# Patient Record
Sex: Female | Born: 1975 | Race: White | Hispanic: No | Marital: Single | Smoking: Never smoker
Health system: Southern US, Community
[De-identification: ages and names within clinical notes are randomized; demographics above are authoritative.]

## PROBLEM LIST (undated history)

## (undated) DIAGNOSIS — I10 Essential (primary) hypertension: Secondary | ICD-10-CM

## (undated) DIAGNOSIS — G039 Meningitis, unspecified: Secondary | ICD-10-CM

## (undated) DIAGNOSIS — R569 Unspecified convulsions: Secondary | ICD-10-CM

## (undated) DIAGNOSIS — I639 Cerebral infarction, unspecified: Secondary | ICD-10-CM

## (undated) DIAGNOSIS — G5622 Lesion of ulnar nerve, left upper limb: Secondary | ICD-10-CM

## (undated) DIAGNOSIS — G43909 Migraine, unspecified, not intractable, without status migrainosus: Secondary | ICD-10-CM

## (undated) HISTORY — PX: CHOLECYSTECTOMY: SHX55

## (undated) HISTORY — DX: Cerebral infarction, unspecified: I63.9

## (undated) HISTORY — PX: NO PAST SURGERIES: SHX2092

## (undated) HISTORY — DX: Unspecified convulsions: R56.9

## (undated) HISTORY — DX: Essential (primary) hypertension: I10

## (undated) HISTORY — PX: LYMPH NODE BIOPSY: SHX201

---

## 2015-04-28 DIAGNOSIS — G5622 Lesion of ulnar nerve, left upper limb: Secondary | ICD-10-CM | POA: Insufficient documentation

## 2016-12-03 ENCOUNTER — Inpatient Hospital Stay (HOSPITAL_COMMUNITY): Payer: Medicaid Other

## 2016-12-03 ENCOUNTER — Inpatient Hospital Stay (HOSPITAL_COMMUNITY)
Admission: EM | Admit: 2016-12-03 | Discharge: 2016-12-10 | DRG: 064 | Disposition: A | Discharge status: Rehabilitation Facility | Payer: Medicaid Other | Attending: Internal Medicine | Admitting: Internal Medicine

## 2016-12-03 ENCOUNTER — Observation Stay (HOSPITAL_COMMUNITY): Payer: Medicaid Other

## 2016-12-03 ENCOUNTER — Emergency Department (HOSPITAL_COMMUNITY): Payer: Medicaid Other

## 2016-12-03 ENCOUNTER — Encounter (HOSPITAL_COMMUNITY): Payer: Self-pay | Admitting: Emergency Medicine

## 2016-12-03 DIAGNOSIS — E669 Obesity, unspecified: Secondary | ICD-10-CM | POA: Diagnosis present

## 2016-12-03 DIAGNOSIS — E876 Hypokalemia: Secondary | ICD-10-CM

## 2016-12-03 DIAGNOSIS — G936 Cerebral edema: Secondary | ICD-10-CM | POA: Diagnosis present

## 2016-12-03 DIAGNOSIS — R4781 Slurred speech: Secondary | ICD-10-CM | POA: Diagnosis present

## 2016-12-03 DIAGNOSIS — E041 Nontoxic single thyroid nodule: Secondary | ICD-10-CM | POA: Diagnosis present

## 2016-12-03 DIAGNOSIS — I69391 Dysphagia following cerebral infarction: Secondary | ICD-10-CM | POA: Diagnosis not present

## 2016-12-03 DIAGNOSIS — R29708 NIHSS score 8: Secondary | ICD-10-CM | POA: Diagnosis present

## 2016-12-03 DIAGNOSIS — I639 Cerebral infarction, unspecified: Secondary | ICD-10-CM

## 2016-12-03 DIAGNOSIS — I10 Essential (primary) hypertension: Secondary | ICD-10-CM | POA: Diagnosis present

## 2016-12-03 DIAGNOSIS — R471 Dysarthria and anarthria: Secondary | ICD-10-CM | POA: Diagnosis present

## 2016-12-03 DIAGNOSIS — I69354 Hemiplegia and hemiparesis following cerebral infarction affecting left non-dominant side: Secondary | ICD-10-CM | POA: Diagnosis not present

## 2016-12-03 DIAGNOSIS — R51 Headache: Secondary | ICD-10-CM

## 2016-12-03 DIAGNOSIS — R7303 Prediabetes: Secondary | ICD-10-CM

## 2016-12-03 DIAGNOSIS — E785 Hyperlipidemia, unspecified: Secondary | ICD-10-CM | POA: Diagnosis present

## 2016-12-03 DIAGNOSIS — R29810 Facial weakness: Secondary | ICD-10-CM | POA: Diagnosis present

## 2016-12-03 DIAGNOSIS — R4182 Altered mental status, unspecified: Secondary | ICD-10-CM | POA: Diagnosis present

## 2016-12-03 DIAGNOSIS — I6523 Occlusion and stenosis of bilateral carotid arteries: Secondary | ICD-10-CM

## 2016-12-03 DIAGNOSIS — Z6834 Body mass index (BMI) 34.0-34.9, adult: Secondary | ICD-10-CM

## 2016-12-03 DIAGNOSIS — G8194 Hemiplegia, unspecified affecting left nondominant side: Secondary | ICD-10-CM | POA: Diagnosis present

## 2016-12-03 DIAGNOSIS — R1310 Dysphagia, unspecified: Secondary | ICD-10-CM | POA: Diagnosis present

## 2016-12-03 DIAGNOSIS — D6861 Antiphospholipid syndrome: Secondary | ICD-10-CM

## 2016-12-03 DIAGNOSIS — I63411 Cerebral infarction due to embolism of right middle cerebral artery: Secondary | ICD-10-CM | POA: Diagnosis present

## 2016-12-03 DIAGNOSIS — G8114 Spastic hemiplegia affecting left nondominant side: Secondary | ICD-10-CM

## 2016-12-03 DIAGNOSIS — E782 Mixed hyperlipidemia: Secondary | ICD-10-CM

## 2016-12-03 DIAGNOSIS — R519 Headache, unspecified: Secondary | ICD-10-CM | POA: Diagnosis present

## 2016-12-03 DIAGNOSIS — I69319 Unspecified symptoms and signs involving cognitive functions following cerebral infarction: Secondary | ICD-10-CM | POA: Diagnosis not present

## 2016-12-03 DIAGNOSIS — R531 Weakness: Secondary | ICD-10-CM

## 2016-12-03 DIAGNOSIS — R414 Neurologic neglect syndrome: Secondary | ICD-10-CM | POA: Diagnosis not present

## 2016-12-03 DIAGNOSIS — R52 Pain, unspecified: Secondary | ICD-10-CM

## 2016-12-03 DIAGNOSIS — Z880 Allergy status to penicillin: Secondary | ICD-10-CM | POA: Diagnosis not present

## 2016-12-03 DIAGNOSIS — G43709 Chronic migraine without aura, not intractable, without status migrainosus: Secondary | ICD-10-CM

## 2016-12-03 HISTORY — DX: Lesion of ulnar nerve, left upper limb: G56.22

## 2016-12-03 HISTORY — DX: Meningitis, unspecified: G03.9

## 2016-12-03 HISTORY — DX: Migraine, unspecified, not intractable, without status migrainosus: G43.909

## 2016-12-03 LAB — COMPREHENSIVE METABOLIC PANEL
ALBUMIN: 2.8 g/dL — AB (ref 3.5–5.0)
ALK PHOS: 92 U/L (ref 38–126)
ALT: 54 U/L (ref 14–54)
AST: 34 U/L (ref 15–41)
Anion gap: 9 (ref 5–15)
BILIRUBIN TOTAL: 0.8 mg/dL (ref 0.3–1.2)
BUN: 12 mg/dL (ref 6–20)
CALCIUM: 8.5 mg/dL — AB (ref 8.9–10.3)
CO2: 24 mmol/L (ref 22–32)
CREATININE: 0.86 mg/dL (ref 0.44–1.00)
Chloride: 105 mmol/L (ref 101–111)
GFR calc Af Amer: >60 mL/min (ref >60)
GFR calc non Af Amer: >60 mL/min (ref >60)
GLUCOSE: 138 mg/dL — AB (ref 65–99)
Potassium: 3.5 mmol/L (ref 3.5–5.1)
Sodium: 138 mmol/L (ref 135–145)
TOTAL PROTEIN: 6.3 g/dL — AB (ref 6.5–8.1)

## 2016-12-03 LAB — I-STAT CHEM 8, ED
BUN: 12 mg/dL (ref 6–20)
CHLORIDE: 103 mmol/L (ref 101–111)
Calcium, Ion: 1.02 mmol/L — ABNORMAL LOW (ref 1.15–1.40)
Creatinine, Ser: 0.9 mg/dL (ref 0.44–1.00)
GLUCOSE: 135 mg/dL — AB (ref 65–99)
HCT: 39 % (ref 36.0–46.0)
Hemoglobin: 13.3 g/dL (ref 12.0–15.0)
Potassium: 3.4 mmol/L — ABNORMAL LOW (ref 3.5–5.1)
Sodium: 140 mmol/L (ref 135–145)
TCO2: 24 mmol/L (ref 0–100)

## 2016-12-03 LAB — VITAMIN B12: Vitamin B-12: 275 pg/mL (ref 180–914)

## 2016-12-03 LAB — DIFFERENTIAL
BASOS ABS: 0.1 10*3/uL (ref 0.0–0.1)
Basophils Relative: 1 %
EOS PCT: 2 %
Eosinophils Absolute: 0.1 10*3/uL (ref 0.0–0.7)
LYMPHS ABS: 3.2 10*3/uL (ref 0.7–4.0)
LYMPHS PCT: 42 %
Monocytes Absolute: 0.7 10*3/uL (ref 0.1–1.0)
Monocytes Relative: 10 %
NEUTROS PCT: 45 %
Neutro Abs: 3.4 10*3/uL (ref 1.7–7.7)

## 2016-12-03 LAB — CBC
HCT: 40.5 % (ref 36.0–46.0)
HEMOGLOBIN: 13.3 g/dL (ref 12.0–15.0)
MCH: 27.8 pg (ref 26.0–34.0)
MCHC: 32.8 g/dL (ref 30.0–36.0)
MCV: 84.7 fL (ref 78.0–100.0)
Platelets: 259 10*3/uL (ref 150–400)
RBC: 4.78 MIL/uL (ref 3.87–5.11)
RDW: 13.4 % (ref 11.5–15.5)
WBC: 7.4 10*3/uL (ref 4.0–10.5)

## 2016-12-03 LAB — APTT: aPTT: 29 seconds (ref 24–36)

## 2016-12-03 LAB — PROTIME-INR
INR: 0.94
Prothrombin Time: 12.6 seconds (ref 11.4–15.2)

## 2016-12-03 LAB — I-STAT TROPONIN, ED: Troponin i, poc: 0 ng/mL (ref 0.00–0.08)

## 2016-12-03 LAB — RPR: RPR Ser Ql: NONREACTIVE

## 2016-12-03 LAB — SEDIMENTATION RATE: SED RATE: 13 mm/h (ref 0–22)

## 2016-12-03 LAB — HIV ANTIBODY (ROUTINE TESTING W REFLEX): HIV SCREEN 4TH GENERATION: NONREACTIVE

## 2016-12-03 LAB — CBG MONITORING, ED: Glucose-Capillary: 134 mg/dL — ABNORMAL HIGH (ref 65–99)

## 2016-12-03 LAB — TSH: TSH: 1.119 u[IU]/mL (ref 0.350–4.500)

## 2016-12-03 MED ORDER — SODIUM CHLORIDE 0.9 % IV SOLN
500.0000 mg | Freq: Once | INTRAVENOUS | Status: AC
  Administered 2016-12-03: 500 mg via INTRAVENOUS
  Filled 2016-12-03 (×2): qty 4

## 2016-12-03 MED ORDER — IOPAMIDOL (ISOVUE-370) INJECTION 76%
INTRAVENOUS | Status: AC
  Filled 2016-12-03: qty 50

## 2016-12-03 MED ORDER — SODIUM CHLORIDE 0.9 % IV SOLN
INTRAVENOUS | Status: AC
Start: 2016-12-03 — End: 2016-12-04
  Administered 2016-12-03: 07:00:00 via INTRAVENOUS

## 2016-12-03 MED ORDER — KETOROLAC TROMETHAMINE 30 MG/ML IJ SOLN
30.0000 mg | Freq: Four times a day (QID) | INTRAMUSCULAR | Status: AC | PRN
  Administered 2016-12-03 – 2016-12-07 (×15): 30 mg via INTRAVENOUS
  Filled 2016-12-03 (×16): qty 1

## 2016-12-03 MED ORDER — IOPAMIDOL (ISOVUE-370) INJECTION 76%
INTRAVENOUS | Status: AC
  Administered 2016-12-03: 50 mL
  Filled 2016-12-03: qty 50

## 2016-12-03 MED ORDER — SODIUM CHLORIDE 0.9% FLUSH
3.0000 mL | Freq: Two times a day (BID) | INTRAVENOUS | Status: DC
  Administered 2016-12-03 – 2016-12-09 (×11): 3 mL via INTRAVENOUS

## 2016-12-03 MED ORDER — TOPIRAMATE 25 MG PO TABS
50.0000 mg | ORAL_TABLET | Freq: Every day | ORAL | Status: DC
  Administered 2016-12-03 – 2016-12-10 (×8): 50 mg via ORAL
  Filled 2016-12-03 (×8): qty 2

## 2016-12-03 MED ORDER — VALPROATE SODIUM 500 MG/5ML IV SOLN
500.0000 mg | Freq: Once | INTRAVENOUS | Status: AC
  Administered 2016-12-03: 500 mg via INTRAVENOUS
  Filled 2016-12-03 (×2): qty 5

## 2016-12-03 MED ORDER — ENOXAPARIN SODIUM 40 MG/0.4ML ~~LOC~~ SOLN
40.0000 mg | SUBCUTANEOUS | Status: DC
  Administered 2016-12-03 – 2016-12-09 (×7): 40 mg via SUBCUTANEOUS
  Filled 2016-12-03 (×8): qty 0.4

## 2016-12-03 NOTE — ED Triage Notes (Signed)
Pt presents via [facility] for code stroke. Last seen normal was 2330 on 07/13 by family member. Patient woke up around 0200 to go to restroom and almost fell. Family noticed slurred speech and weakness on the left side

## 2016-12-03 NOTE — ED Notes (Signed)
Attempted to call report

## 2016-12-03 NOTE — Evaluation (Signed)
Patient admitted after midnight. 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 I discussed the case with Dr.Xui will continue medications for seizure. Plan stroke workup with lower extremity Doppler CTA of the head and neck in a 2-D echocardiogram. Communication from speech therapy reveals patient is a modified barium swallow. Discussed the case with patient's father.

## 2016-12-03 NOTE — Progress Notes (Signed)
Responded to Code Stroke called at 0233.  Pt arrived via EMS at 0250 with L arm weakness, L droop, and c/o migraine.  Pt says she has had migraines since childhood.  CBG-98, [id number]. NIH-8. CT negative for acute changes.  Code stroke cancelled at 0330.  Pt to be admitted for migraine workup.

## 2016-12-03 NOTE — Evaluation (Signed)
Clinical/Bedside Swallow Evaluation Patient Details  Name: [facility] MRN: [id number] Date of Birth: 10/11/1975  Today's Date: 12/03/2016 Time: SLP Start Time (ACUTE ONLY): 1001 SLP Stop Time (ACUTE ONLY): 1022 SLP Time Calculation (min) (ACUTE ONLY): 21 min  Past Medical History:  Past Medical History:  Diagnosis Date  . Migraine    Past Surgical History:  Past Surgical History:  Procedure Laterality Date  . CESAREAN SECTION Bilateral 1995, 2002   HPI:  41 y.o. female w hx of migraines was at home around 11pm c/o headache and then had altered mental status Headache frontal and occipital.  Very severe.  Pt states reaching for excedrin, didn't seem to help.  Pt present to ED and had further complaints of left sided jerking and weakness; CT negative; MRI pending; failed NSSS    Assessment / Plan / Recommendation Clinical Impression   Pt demonstrates oropharyngeal dysphagia with overt s/s of aspiration including delayed cough/throat clear with larger volumes of thin liquids; smaller volumes appear WFL, but suspect a delay in the initiation of the swallow and paired with left oral weakness/ROM and anterior left loss, pt is at risk for aspiration with this consistency; puree with prolonged oral manipulation and multiple swallows noted; pt should remain NPO until an objective assessment can be completed SLP Visit Diagnosis: Dysphagia, oropharyngeal phase (R13.12)    Aspiration Risk  Moderate aspiration risk    Diet Recommendation   NPO  Medication Administration: Via alternative means    Other  Recommendations Oral Care Recommendations: Oral care QID   Follow up Recommendations Other (comment) (TBD)      Frequency and Duration   TBD pending instrumental assessment         Prognosis Prognosis for Safe Diet Advancement: Fair      Swallow Study   General Date of Onset: 12/03/16 HPI: 41 y.o. female,  Type of Study: Bedside Swallow Evaluation Previous Swallow  Assessment:  (NSSS; failed 12/03/16) Diet Prior to this Study: NPO Temperature Spikes Noted: No Respiratory Status: Room air History of Recent Intubation: No Behavior/Cognition: Cooperative;Lethargic/Drowsy Oral Cavity Assessment: Other (comment) (oral pooling) Oral Care Completed by SLP: Recent completion by staff Oral Cavity - Dentition: Adequate natural dentition Self-Feeding Abilities: Able to feed self;Needs assist Patient Positioning: Upright in bed Baseline Vocal Quality: Low vocal intensity Volitional Cough: Weak Volitional Swallow: Able to elicit    Oral/Motor/Sensory Function Overall Oral Motor/Sensory Function: Mild impairment Facial ROM: Reduced left Facial Symmetry: Abnormal symmetry left Facial Strength: Reduced left Facial Sensation: Reduced left Lingual ROM: Reduced left Lingual Symmetry: Abnormal symmetry left Lingual Strength: Reduced Lingual Sensation: Reduced   Ice Chips Ice chips: Not tested Other Comments:  (Pt refusal)   Thin Liquid Thin Liquid: Impaired Presentation: Cup;Spoon;Straw Oral Phase Impairments: Reduced labial seal;Reduced lingual movement/coordination Oral Phase Functional Implications: Left anterior spillage Pharyngeal  Phase Impairments: Suspected delayed Swallow;Throat Clearing - Delayed;Cough - Delayed Other Comments:  (Appears to occur with larger volumes of liquids only)    Nectar Thick Nectar Thick Liquid: Not tested   Honey Thick Honey Thick Liquid: Not tested   Puree Puree: Impaired Presentation: Spoon Oral Phase Impairments: Reduced labial seal;Reduced lingual movement/coordination Oral Phase Functional Implications: Prolonged oral transit Pharyngeal Phase Impairments: Suspected delayed Swallow;Multiple swallows   Solid      Solid: Not tested    Functional Assessment Tool Used: NOMS; clinical judgment Functional Limitations: Swallowing Swallow Current Status (B1478(G8996): At least 40 percent but less than 60 percent impaired,  limited or restricted Swallow Goal Status (  Z6109): At least 20 percent but less than 40 percent impaired, limited or restricted   Tressie Stalker, M.S., CCC-SLP 12/03/2016,10:31 AM

## 2016-12-03 NOTE — Progress Notes (Signed)
Patient arrived to unit via ED staff with family and belongings at bedside.  Vitals stable, tele applied and verified.  Admission unable to be completed fully at this time.  Continue to monitor patient.

## 2016-12-03 NOTE — Progress Notes (Signed)
Modified Barium Swallow Progress Note  Patient Details  Name: [facility] MRN: [id number] Date of Birth: 05/04/1976  Today's Date: 12/03/2016  Modified Barium Swallow completed.  Full report located under Chart Review in the Imaging Section.  Brief recommendations include the following:  Clinical Impression    Pt presents with mild-moderate oropharyngeal dysphagia characterized by premature spillage to pyriform sinuses prior to swallow triggering d/t sensory impairment; anterior loss noted with liquids either via tsp and/or cup; chin tuck and head turn left attempted with larger volumes of thin and nectar-thickened liquids, but was inconsistent d/t pt lethargy, subsequently, larger volumes of thin entered laryngeal vestibule with cup sips and to level of the vocal folds with straw sips and ejected out; no penetration and/or aspiration noted with nectar-thickened liquids via cup/straw, but pt is at risk with larger amounts; mechanical/soft consistency attempted, but removed from oral cavity d/t impaired mastication/poor lingual control/manipulation paired with pt lethargy; recommend Dysphagia 1 (puree) with nectar-thickened liquids via individual straw sips to reduce anterior loss presented on right side pending pt alertness level and increased appetite with swallowing precautions in place during PO intake   Swallow Evaluation Recommendations       SLP Diet Recommendations: Dysphagia 1 (Puree) solids;Nectar thick liquid   Liquid Administration via: Cup;Straw;Other (Comment) (small sips only)   Medication Administration: Crushed with puree   Supervision: Staff to assist with self feeding   Compensations: Slow rate;Small sips/bites;Lingual sweep for clearance of pocketing;Monitor for anterior loss       Oral Care Recommendations: Oral care BID   Other Recommendations: Order thickener from pharmacy    Tressie StalkerPat Adams, M.S., CCC-SLP 12/03/2016,1:37 PM

## 2016-12-03 NOTE — H&P (Signed)
TR[facility] H&P   Patient Demographics:    [facility], is a 41 y.o. female  MRN: [id number]   DOB - 09/08/1975  Admit Date - 12/03/2016  Outpatient Primary MD for the patient is Patient, No Pcp Per  Referring MD/NP/PA: Pricilla Loveless  Outpatient Specialists: none  Patient coming from: home  Chief Complaint  Patient presents with  . Code Stroke      HPI:    [facility]  is a 41 y.o. female,  w hx of migraines was at home around 11pm c/o headache and then had altered mental status Headache frontal and occipital.  Very severe.  Pt states reaching for excedrin, didn't seem to help.  Pt present to ED and had further complaints of left sided jerking and weakness.    In ED,  CT brain negative.  Pt was seen by neurology who recommended MRI brain and if negative could have outpatient follow up.   MRI brain pending.     Review of systems:    In addition to the HPI above,  +shaking No Fever-chills, No changes with Vision or hearing, No problems swallowing food or Liquids, No Chest pain, Cough or Shortness of Breath, No Abdominal pain, No Nausea or Vommitting, Bowel movements are regular, No Blood in stool or Urine, No dysuria, No new skin rashes or bruises, No new joints pains-aches, No recent weight gain or loss, No polyuria, polydypsia or polyphagia, No significant Mental Stressors.  A full 10 point Review of Systems was done, except as stated above, all other Review of Systems were negative.   With Past History of the following :    Past Medical History:  Diagnosis Date  . Migraine       History reviewed. No pertinent surgical history.    Social History:     Social History  Substance Use Topics  . Smoking status: Never Smoker  . Smokeless tobacco: Never Used  . Alcohol use No     Lives - at home  Mobility -      Family History :    + hx  of migraine   Home Medications:   Prior to Admission medications   Medication Sig Start Date End Date Taking? Authorizing Provider  aspirin-acetaminophen-caffeine (EXCEDRIN MIGRAINE) [phone] MG tablet Take 1 tablet by mouth every 6 (six) hours as needed for headache.   Yes [provider]     Allergies:     Allergies  Allergen Reactions  . Penicillins Other (See Comments)    unknown     Physical Exam:   Vitals  Blood pressure 112/76, pulse 75, resp. rate 16, weight 88.9 kg (195 lb 15.8 oz), SpO2 95 %.   1. General  lying in bed in NAD  2. Normal affect and insight, Not Suicidal or Homicidal, Awake Alert, Oriented X 3.  3. No F.N deficits, ALL C.Nerves Intact, Strength 5/5  all 4 extremities, Sensation intact all 4 extremities, Plantars down going.  4. Ears and Eyes appear Normal, Conjunctivae clear, PERRLA. Moist Oral Mucosa.  5. Supple Neck, No JVD, No cervical lymphadenopathy appriciated, No Carotid Bruits.  6. Symmetrical Chest wall movement, Good air movement bilaterally, CTAB.  7. RRR, No Gallops, Rubs or Murmurs, No Parasternal Heave.  8. Positive Bowel Sounds, Abdomen Soft, No tenderness, No organomegaly appriciated,No rebound -guarding or rigidity.  9.  No Cyanosis, Normal Skin Turgor, No Skin Rash or Bruise.  10. Good muscle tone,  joints appear normal , no effusions, Normal ROM.  11. No Palpable Lymph Nodes in Neck or Axillae    Data Review:    CBC  Recent Labs Lab 12/03/16 0254 12/03/16 0304  WBC 7.4  --   HGB 13.3 13.3  HCT 40.5 39.0  PLT 259  --   MCV 84.7  --   MCH 27.8  --   MCHC 32.8  --   RDW 13.4  --   LYMPHSABS 3.2  --   MONOABS 0.7  --   EOSABS 0.1  --   BASOSABS 0.1  --    ------------------------------------------------------------------------------------------------------------------  Chemistries   Recent Labs Lab 12/03/16 0254 12/03/16 0304  NA 138 140  K 3.5 3.4*  CL 105 103  CO2 24  --   GLUCOSE 138*  135*  BUN 12 12  CREATININE 0.86 0.90  CALCIUM 8.5*  --   AST 34  --   ALT 54  --   ALKPHOS 92  --   BILITOT 0.8  --    ------------------------------------------------------------------------------------------------------------------ CrCl cannot be calculated (Unknown ideal weight.). ------------------------------------------------------------------------------------------------------------------ No results for input(s): TSH, T4TOTAL, T3FREE, THYROIDAB in the last 72 hours.  Invalid input(s): FREET3  Coagulation profile  Recent Labs Lab 12/03/16 0254  INR 0.94   ------------------------------------------------------------------------------------------------------------------- No results for input(s): DDIMER in the last 72 hours. -------------------------------------------------------------------------------------------------------------------  Cardiac Enzymes No results for input(s): CKMB, TROPONINI, MYOGLOBIN in the last 168 hours.  Invalid input(s): CK ------------------------------------------------------------------------------------------------------------------ No results found for: BNP   ---------------------------------------------------------------------------------------------------------------  Urinalysis No results found for: COLORURINE, APPEARANCEUR, LABSPEC, PHURINE, GLUCOSEU, HGBUR, BILIRUBINUR, KETONESUR, PROTEINUR, UROBILINOGEN, NITRITE, LEUKOCYTESUR  ----------------------------------------------------------------------------------------------------------------   Imaging Results:    Ct Head Code Stroke W/o Cm  Result Date: 12/03/2016 CLINICAL DATA:  Code stroke. Slurred speech and weakness. Last seen normal at 0200 hours. EXAM: CT HEAD WITHOUT CONTRAST TECHNIQUE: Contiguous axial images were obtained from the base of the skull through the vertex without intravenous contrast. COMPARISON:  None. FINDINGS: Mild motion degraded examination. BRAIN: No  intraparenchymal hemorrhage, mass effect nor midline shift. The ventricles and sulci are normal. No acute large vascular territory infarcts. No abnormal extra-axial fluid collections. Basal cisterns are patent. VASCULAR: Unremarkable. SKULL/SOFT TISSUES: No skull fracture. No significant soft tissue swelling. ORBITS/SINUSES: The included ocular globes and orbital contents are normal.The mastoid aircells and included paranasal sinuses are well-aerated. Dehiscent RIGHT jugular bulb. OTHER: None. ASPECTS [facility] Stroke Program Early CT Score) - Ganglionic level infarction (caudate, lentiform nuclei, internal capsule, insula, M1-M3 cortex): 7 - Supraganglionic infarction (M4-M6 cortex): 3 Total score (0-10 with 10 being normal): 10 IMPRESSION: 1. Negative mildly motion degraded noncontrast CT HEAD. 2. ASPECTS is 10. 3. Critical Value/emergent results were called by telephone at the time of interpretation on 12/03/2016 at 3:15 am to Dr. Nicholas Lose, Neurology, who verbally acknowledged these results. Electronically Signed   By: Awilda Metro M.D.   On: 12/03/2016 03:17       Assessment &  Plan:    Active Problems:   Headache   Hypokalemia    Headache, likely migraine tx with topiramate.  MRI brain  If negative can do home   Tramadol 50mg  po q6h prn  Hypokalemia, replete Check cmp in am   DVT  Lovenox - SCDs   AM Labs Ordered, also please review Full Orders  Family Communication: Admission, patients condition and plan of care including tests being ordered have been discussed with the patient  who indicate understanding and agree with the plan and Code Status.  Code Status FULL CODE  Likely DC to  home  Condition GUARDED    Consults called: neurology  Admission status: observation    Time spent in minutes : 45   Pearson GrippeJames Raynee Mccasland M.D on 12/03/2016 at 4:50 AM  Between 7pm to 7am - Pager - [phone]  After 7am go to [email or web address] - password [email or web address]  [facility] - Office   [phone]

## 2016-12-03 NOTE — Consult Note (Signed)
Reason for Consult: Code stroke Referring Physician: ER  [facility] is an 41 y.o. female.  HPI: Patient developed a migraine headache at about 11:30 pm.  She went to bed shortly after and woke up at 2 am with left sided weakness.  She says that she has had these types of headaches with weakness since childhood and current frequency is about 2x/month.  She is not on any migraine preventative medication for some unknown reason.  She usually takes Excedrin migraine to deal with them.  CT Brain was normal.  She has no other medical history or known vascular risk factors.  She is on no regular medications.  The migraine is described as right frontotemporal throbbing pain associated with photophobia and aggravated by movement.   She has never been evaluated for this.    Past Medical History:  Diagnosis Date  . Migraine     History reviewed. No pertinent surgical history.  No family history on file.  Social History:  has no tobacco, alcohol, and drug history on file.  Allergies:  Allergies  Allergen Reactions  . Penicillins     Prior to Admission medications   Not on File    Medications: Scheduled:  Results for orders placed or performed during the hospital encounter of 12/03/16 (from the past 48 hour(s))  Protime-INR     Status: None   Collection Time: 12/03/16  2:54 AM  Result Value Ref Range   Prothrombin Time 12.6 11.4 - 15.2 seconds   INR 0.94   APTT     Status: None   Collection Time: 12/03/16  2:54 AM  Result Value Ref Range   aPTT 29 24 - 36 seconds  CBC     Status: None   Collection Time: 12/03/16  2:54 AM  Result Value Ref Range   WBC 7.4 4.0 - 10.5 K/uL   RBC 4.78 3.87 - 5.11 MIL/uL   Hemoglobin 13.3 12.0 - 15.0 g/dL   HCT 40.5 36.0 - 46.0 %   MCV 84.7 78.0 - 100.0 fL   MCH 27.8 26.0 - 34.0 pg   MCHC 32.8 30.0 - 36.0 g/dL   RDW 13.4 11.5 - 15.5 %   Platelets 259 150 - 400 K/uL  Differential     Status: None   Collection Time: 12/03/16  2:54 AM  Result  Value Ref Range   Neutrophils Relative % 45 %   Neutro Abs 3.4 1.7 - 7.7 K/uL   Lymphocytes Relative 42 %   Lymphs Abs 3.2 0.7 - 4.0 K/uL   Monocytes Relative 10 %   Monocytes Absolute 0.7 0.1 - 1.0 K/uL   Eosinophils Relative 2 %   Eosinophils Absolute 0.1 0.0 - 0.7 K/uL   Basophils Relative 1 %   Basophils Absolute 0.1 0.0 - 0.1 K/uL  Comprehensive metabolic panel     Status: Abnormal   Collection Time: 12/03/16  2:54 AM  Result Value Ref Range   Sodium 138 135 - 145 mmol/L   Potassium 3.5 3.5 - 5.1 mmol/L   Chloride 105 101 - 111 mmol/L   CO2 24 22 - 32 mmol/L   Glucose, Bld 138 (H) 65 - 99 mg/dL   BUN 12 6 - 20 mg/dL   Creatinine, Ser 0.86 0.44 - 1.00 mg/dL   Calcium 8.5 (L) 8.9 - 10.3 mg/dL   Total Protein 6.3 (L) 6.5 - 8.1 g/dL   Albumin 2.8 (L) 3.5 - 5.0 g/dL   AST 34 15 - 41 U/L  ALT 54 14 - 54 U/L   Alkaline Phosphatase 92 38 - 126 U/L   Total Bilirubin 0.8 0.3 - 1.2 mg/dL   GFR calc non Af Amer >60 >60 mL/min   GFR calc Af Amer >60 >60 mL/min    Comment: (NOTE) The eGFR has been calculated using the CKD EPI equation. This calculation has not been validated in all clinical situations. eGFR's persistently <60 mL/min signify possible Chronic Kidney Disease.    Anion gap 9 5 - 15  CBG monitoring, ED     Status: Abnormal   Collection Time: 12/03/16  2:57 AM  Result Value Ref Range   Glucose-Capillary 134 (H) 65 - 99 mg/dL  I-stat troponin, ED     Status: None   Collection Time: 12/03/16  3:03 AM  Result Value Ref Range   Troponin i, poc 0.00 0.00 - 0.08 ng/mL   Comment 3            Comment: Due to the release kinetics of cTnI, a negative result within the first hours of the onset of symptoms does not rule out myocardial infarction with certainty. If myocardial infarction is still suspected, repeat the test at appropriate intervals.   I-Stat Chem 8, ED     Status: Abnormal   Collection Time: 12/03/16  3:04 AM  Result Value Ref Range   Sodium 140 135 - 145  mmol/L   Potassium 3.4 (L) 3.5 - 5.1 mmol/L   Chloride 103 101 - 111 mmol/L   BUN 12 6 - 20 mg/dL   Creatinine, Ser 0.90 0.44 - 1.00 mg/dL   Glucose, Bld 135 (H) 65 - 99 mg/dL   Calcium, Ion 1.02 (L) 1.15 - 1.40 mmol/L   TCO2 24 0 - 100 mmol/L   Hemoglobin 13.3 12.0 - 15.0 g/dL   HCT 39.0 36.0 - 46.0 %    Ct Head Code Stroke W/o Cm  Result Date: 12/03/2016 CLINICAL DATA:  Code stroke. Slurred speech and weakness. Last seen normal at 0200 hours. EXAM: CT HEAD WITHOUT CONTRAST TECHNIQUE: Contiguous axial images were obtained from the base of the skull through the vertex without intravenous contrast. COMPARISON:  None. FINDINGS: Mild motion degraded examination. BRAIN: No intraparenchymal hemorrhage, mass effect nor midline shift. The ventricles and sulci are normal. No acute large vascular territory infarcts. No abnormal extra-axial fluid collections. Basal cisterns are patent. VASCULAR: Unremarkable. SKULL/SOFT TISSUES: No skull fracture. No significant soft tissue swelling. ORBITS/SINUSES: The included ocular globes and orbital contents are normal.The mastoid aircells and included paranasal sinuses are well-aerated. Dehiscent RIGHT jugular bulb. OTHER: None. ASPECTS [facility] Stroke Program Early CT Score) - Ganglionic level infarction (caudate, lentiform nuclei, internal capsule, insula, M1-M3 cortex): 7 - Supraganglionic infarction (M4-M6 cortex): 3 Total score (0-10 with 10 being normal): 10 IMPRESSION: 1. Negative mildly motion degraded noncontrast CT HEAD. 2. ASPECTS is 10. 3. Critical Value/emergent results were called by telephone at the time of interpretation on 12/03/2016 at 3:15 am to Dr. Orlena Sheldon, Neurology, who verbally acknowledged these results. Electronically Signed   By: Elon Alas M.D.   On: 12/03/2016 03:17    ROS Pulse 80, resp. rate (!) 5, weight 88.9 kg (195 lb 15.8 oz), SpO2 93 %. Neurologic Examination:  Awake, but lethargic.  Fully oriented. Dysarthria, but fluent  and comprehension, naming, repetition- intact. Left lower facial droop- mild. LUE 3/5 weakness.  LLE 4/5 weakness.  Left babinski.  No hoffman's.   Sensory is intact.      Assessment/Plan:  Although  neurological exam is concerning given left face, arm, and leg weakness associated with babinski, her migraine is atypical and no clear vascular risk factors.  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   Therefore, not a candidate for IV tPA.    Recommend admission to medicine: 1. MRI Brain without contrast to confirm no stroke.  If stroke is present, then management will change from that point.  2. Toradol 60 mg IV q6hr prn.  3. Start Topamax 50 mg qhs for migraine prevention.  This should be increased to 100 mg qhs after one week.     Rogue Jury, MD 12/03/2016, 3:37 AM

## 2016-12-03 NOTE — ED Provider Notes (Signed)
[facility]-EMERGENCY DEPT Provider Note   CSN: [id number] Arrival date & time: 12/03/16  0255 By signing my name below, I, Levon Hedger, attest that this documentation has been prepared under the direction and in the presence of Pricilla Loveless, MD . Electronically Signed: Levon Hedger, Scribe. 12/03/2016. 3:31 AM.   History   Chief Complaint Chief Complaint  Patient presents with  . Code Stroke   HPI Shannon Erickson is a 41 y.o. female with a history of migraines who presents to the Emergency Department for evaluation of possible stroke, last seen normal at 11:30 pm. Pt complains of 10/10 right-sided "splitting" headache onset earlier today. She went to the restroom at 2 this AM and when her daughter noticed pt with slurred speech and left lateral weakness.Pt also reports associated neck pain. No OTC treatments tried for these symptoms PTA.  No alleviating or modifying factors noted.  Per pt, she has severe headaches with similar symptoms 2x per month. Pt is not currently followed by a neurologist and has never been seen for these symptoms prior to tonight. No history of DM or HTN. She denies any nausea, vomiting, or fevers. HA feels like a typical migraine to her.  The history is provided by the patient. No language interpreter was used.    Past Medical History:  Diagnosis Date  . Migraine     There are no active problems to display for this patient.   History reviewed. No pertinent surgical history.  OB History    No data available     Home Medications    Prior to Admission medications   Medication Sig Start Date End Date Taking? Authorizing Provider  aspirin-acetaminophen-caffeine (EXCEDRIN MIGRAINE) [phone] MG tablet Take 1 tablet by mouth every 6 (six) hours as needed for headache.   Yes [provider]    Family History No family history on file.  Social History Social History  Substance Use Topics  . Smoking status: Not on file  . Smokeless tobacco:  Not on file  . Alcohol use Not on file     Allergies   Penicillins   Review of Systems Review of Systems  Constitutional: Negative for fever.  Gastrointestinal: Negative for nausea and vomiting.  Musculoskeletal: Positive for neck pain.  Allergic/Immunologic: Negative for immunocompromised state.  Neurological: Positive for speech difficulty, weakness, numbness and headaches.  All other systems reviewed and are negative.  Physical Exam Updated Vital Signs BP 112/76   Pulse 75   Resp 16   Wt 88.9 kg (195 lb 15.8 oz)   SpO2 95%   Physical Exam  Constitutional: She is oriented to person, place, and time. She appears well-developed and well-nourished.  HENT:  Head: Normocephalic and atraumatic.  Right Ear: External ear normal.  Left Ear: External ear normal.  Nose: Nose normal.  Eyes: Right eye exhibits no discharge. Left eye exhibits no discharge.  Cardiovascular: Normal rate, regular rhythm and normal heart sounds.   Pulmonary/Chest: Effort normal and breath sounds normal.  Abdominal: Soft. There is no tenderness.  Neurological: She is alert and oriented to person, place, and time.  Awake, alert, slurred speech. Left sided facial droop. Normal gross sensation in face and all four extremities. Left upper extremity is flaccid, 5/5 strength in all other extremities.   Skin: Skin is warm and dry.  Nursing note and vitals reviewed.  ED Treatments / Results  DIAGNOSTIC STUDIES:  Oxygen Saturation is 98% on RA, normal by my interpretation.     Labs (all labs ordered  are listed, but only abnormal results are displayed) Labs Reviewed  COMPREHENSIVE METABOLIC PANEL - Abnormal; Notable for the following:       Result Value   Glucose, Bld 138 (*)    Calcium 8.5 (*)    Total Protein 6.3 (*)    Albumin 2.8 (*)    All other components within normal limits  CBG MONITORING, ED - Abnormal; Notable for the following:    Glucose-Capillary 134 (*)    All other components within  normal limits  I-STAT CHEM 8, ED - Abnormal; Notable for the following:    Potassium 3.4 (*)    Glucose, Bld 135 (*)    Calcium, Ion 1.02 (*)    All other components within normal limits  PROTIME-INR  APTT  CBC  DIFFERENTIAL  I-STAT TROPOININ, ED    EKG  EKG Interpretation  Date/Time:  Saturday December 03 2016 03:16:25 EDT Ventricular Rate:  96 PR Interval:    QRS Duration: 94 QT Interval:  376 QTC Calculation: 476 R Axis:   63 Text Interpretation:  Sinus rhythm RSR' in V1 or V2, probably normal variant No old tracing to compare Confirmed by Pricilla LovelessGoldston, Ondra Deboard [phone]) on 12/03/2016 3:45:17 AM       Radiology Ct Head Code Stroke W/o Cm  Result Date: 12/03/2016 CLINICAL DATA:  Code stroke. Slurred speech and weakness. Last seen normal at 0200 hours. EXAM: CT HEAD WITHOUT CONTRAST TECHNIQUE: Contiguous axial images were obtained from the base of the skull through the vertex without intravenous contrast. COMPARISON:  None. FINDINGS: Mild motion degraded examination. BRAIN: No intraparenchymal hemorrhage, mass effect nor midline shift. The ventricles and sulci are normal. No acute large vascular territory infarcts. No abnormal extra-axial fluid collections. Basal cisterns are patent. VASCULAR: Unremarkable. SKULL/SOFT TISSUES: No skull fracture. No significant soft tissue swelling. ORBITS/SINUSES: The included ocular globes and orbital contents are normal.The mastoid aircells and included paranasal sinuses are well-aerated. Dehiscent RIGHT jugular bulb. OTHER: None. ASPECTS [facility]([place] Stroke Program Early CT Score) - Ganglionic level infarction (caudate, lentiform nuclei, internal capsule, insula, M1-M3 cortex): 7 - Supraganglionic infarction (M4-M6 cortex): 3 Total score (0-10 with 10 being normal): 10 IMPRESSION: 1. Negative mildly motion degraded noncontrast CT HEAD. 2. ASPECTS is 10. 3. Critical Value/emergent results were called by telephone at the time of interpretation on 12/03/2016 at 3:15 am  to Dr. Nicholas LoseEshraghi, Neurology, who verbally acknowledged these results. Electronically Signed   By: Awilda Metroourtnay  Bloomer M.D.   On: 12/03/2016 03:17   Procedures Procedures (including critical care time)  Medications Ordered in ED Medications  ketorolac (TORADOL) 30 MG/ML injection 30 mg (30 mg Intravenous Given 12/03/16 0431)  topiramate (TOPAMAX) tablet 50 mg (not administered)     Initial Impression / Assessment and Plan / ED Course  I have reviewed the triage vital signs and the nursing notes.  Pertinent labs & imaging results that were available during my care of the patient were reviewed by me and considered in my medical decision making (see chart for details).     Patient's airway is stable. Sounds most likely a complicated migraine. D/w Dr Nicholas LoseEshraghi, who cancelled code stroke but feels she needs to be admitted for MRI, treatment of migraine to ensure resolution of weakness. D/w hospitalist, Dr. Selena BattenKim, who will admit.  Final Clinical Impressions(s) / ED Diagnoses   Final diagnoses:  Left-sided weakness    New Prescriptions New Prescriptions   No medications on file   I personally performed the services described in this documentation, which  was scribed in my presence. The recorded information has been reviewed and is accurate.    Pricilla Loveless, MD 12/03/16 [phone]

## 2016-12-03 NOTE — Progress Notes (Signed)
Pt neuro checks not done at 12, pt was off the floor for MRI

## 2016-12-03 NOTE — Progress Notes (Signed)
Pt had 50 mls Isovue 370 extravasate into right antecubital. Dr Alfredo BattyMattern assessed pt, IV was removed, arm elevated, and heat applied at site.  Verbal handoff was given to RN, Alan Ripperlaire. Extravasation discharge orders to be placed.

## 2016-12-03 NOTE — Progress Notes (Signed)
Same Day Follow Up  Seen and examined. Continues to have HA and left hemiparesis.  A: Complex migraine v. Stroke.  Recommend Depakote 500 IV once Solumedrol 500 IV once MRI  Will follow after imaging  Please call with questions.  Milon DikesAshish Furman Trentman, MD [facility] [phone]  If 7pm to 7am, please call on call as listed on AMION.

## 2016-12-04 ENCOUNTER — Encounter (HOSPITAL_COMMUNITY): Payer: Self-pay

## 2016-12-04 ENCOUNTER — Other Ambulatory Visit (HOSPITAL_COMMUNITY): Payer: Self-pay

## 2016-12-04 DIAGNOSIS — E782 Mixed hyperlipidemia: Secondary | ICD-10-CM

## 2016-12-04 DIAGNOSIS — I63421 Cerebral infarction due to embolism of right anterior cerebral artery: Secondary | ICD-10-CM

## 2016-12-04 DIAGNOSIS — G43709 Chronic migraine without aura, not intractable, without status migrainosus: Secondary | ICD-10-CM

## 2016-12-04 DIAGNOSIS — E785 Hyperlipidemia, unspecified: Secondary | ICD-10-CM

## 2016-12-04 LAB — CBC
HEMATOCRIT: 41.9 % (ref 36.0–46.0)
HEMOGLOBIN: 13.5 g/dL (ref 12.0–15.0)
MCH: 27.4 pg (ref 26.0–34.0)
MCHC: 32.2 g/dL (ref 30.0–36.0)
MCV: 85.2 fL (ref 78.0–100.0)
Platelets: 307 10*3/uL (ref 150–400)
RBC: 4.92 MIL/uL (ref 3.87–5.11)
RDW: 13.6 % (ref 11.5–15.5)
WBC: 12.5 10*3/uL — ABNORMAL HIGH (ref 4.0–10.5)

## 2016-12-04 LAB — COMPREHENSIVE METABOLIC PANEL
ALBUMIN: 2.7 g/dL — AB (ref 3.5–5.0)
ALK PHOS: 95 U/L (ref 38–126)
ALT: 47 U/L (ref 14–54)
ANION GAP: 6 (ref 5–15)
AST: 25 U/L (ref 15–41)
BUN: 8 mg/dL (ref 6–20)
CHLORIDE: 112 mmol/L — AB (ref 101–111)
CO2: 20 mmol/L — AB (ref 22–32)
Calcium: 8.4 mg/dL — ABNORMAL LOW (ref 8.9–10.3)
Creatinine, Ser: 0.84 mg/dL (ref 0.44–1.00)
GFR calc Af Amer: >60 mL/min (ref >60)
GFR calc non Af Amer: >60 mL/min (ref >60)
GLUCOSE: 198 mg/dL — AB (ref 65–99)
POTASSIUM: 3.8 mmol/L (ref 3.5–5.1)
SODIUM: 138 mmol/L (ref 135–145)
Total Bilirubin: 0.7 mg/dL (ref 0.3–1.2)
Total Protein: 6.4 g/dL — ABNORMAL LOW (ref 6.5–8.1)

## 2016-12-04 LAB — LIPID PANEL
CHOL/HDL RATIO: 4.7 ratio
Cholesterol: 193 mg/dL (ref 0–200)
HDL: 41 mg/dL (ref >40)
LDL CALC: 138 mg/dL — AB (ref 0–99)
Triglycerides: 70 mg/dL (ref <150)
VLDL: 14 mg/dL (ref 0–40)

## 2016-12-04 LAB — ANTITHROMBIN III: AntiThromb III Func: 113 % (ref 75–120)

## 2016-12-04 MED ORDER — ATORVASTATIN CALCIUM 40 MG PO TABS
40.0000 mg | ORAL_TABLET | Freq: Every day | ORAL | Status: DC
  Administered 2016-12-04 – 2016-12-05 (×2): 40 mg via ORAL
  Filled 2016-12-04 (×3): qty 1

## 2016-12-04 MED ORDER — ASPIRIN EC 325 MG PO TBEC
325.0000 mg | DELAYED_RELEASE_TABLET | Freq: Every day | ORAL | Status: DC
  Administered 2016-12-04: 325 mg via ORAL
  Filled 2016-12-04 (×2): qty 1

## 2016-12-04 NOTE — Progress Notes (Signed)
Pt's right antecubital exhibited no signs of redness, warmth, swelling, or pain overnight. Ice pack was applied 30 minutes and extremity elevated.

## 2016-12-04 NOTE — Progress Notes (Signed)
PROGRESS NOTE    Shannon Erickson  ZOX:[id number] DOB: 10/10/1975 DOA: 12/03/2016 PCP: Patient, No Pcp Per   Brief Narrative:   Shannon DiceCynthia Erickson  is a 41 y.o. female, w hx of migraines was at home around 11pm c/o headache and then had altered mental status Headache frontal and occipital.  Very severe.  Pt present to ED and had further complaints of left sided jerking and weakness.  MRI of brain revealed multiple embolic strokes. Concern for Endocarditis so aspirin on hold.  Assessment & Plan:   Principal Problem:   Cerebral embolism with cerebral infarction Active Problems:   Headache   Hypokalemia   Left-sided weakness  Cerebral embolism with cerebral infarction: Stroke workup in process. Case discussed with neurologist. Concern for endocarditis. Echocardiogram is pending. Will hold aspirin until endocarditis is ruled out. Patient will need PT OT evaluations. Currently she is nothing by mouth as ST has deemed her to unsafe to swallow.  Headache: Likely due to acute stroke. Will closely monitor headache appears to be improving.  Hypokalemia: Improved after treatment will continue to monitor.  Left-sided weakness: Due to acute CVA. Patient will need PT and OT. May be a candidate for inpatient rehabilitation.   DVT prophylaxis: Lovenox and SCDs  Code Status: Full code  Family Communication: Spoke with patient's sister at bedside. Disposition Plan: May need inpatient rehabilitation I have consult it. Consultants:   Stroke team   Procedures: Echo is pending    Subjective: Seen several times over the past 24 hours. Speech is improved. She continues to be somewhat lethargic. She continues to have a dense left hemiparesis. She has no complaints of pain.  Objective: Vitals:   12/04/16 0034 12/04/16 0525 12/04/16 0600 12/04/16 1009  BP: 136/70 (!) 112/58 120/67 120/63  Pulse: 88 74  97  Resp: 20 18  17   Temp: 98.5 F (36.9 C) 97.8 F (36.6 C)  98 F (36.7 C)  TempSrc:  Oral Oral  Axillary  SpO2: 99% 96%  98%  Weight:  88.3 kg (194 lb 10.7 oz)    Height:        Intake/Output Summary (Last 24 hours) at 12/04/16 1239 Last data filed at 12/04/16 0525  Gross per 24 hour  Intake              379 ml  Output              400 ml  Net              -21 ml   Filed Weights   12/03/16 0305 12/03/16 0602 12/04/16 0525  Weight: 88.9 kg (195 lb 15.8 oz) 86.1 kg (189 lb 12.8 oz) 88.3 kg (194 lb 10.7 oz)    Examination:  General exam: Appears calm and comfortable  Respiratory system: Clear to auscultation. Respiratory effort normal. Cardiovascular system: S1 & S2 heard, RRR. No JVD, murmurs, rubs, gallops or clicks. No pedal edema. Gastrointestinal system: Abdomen is nondistended, soft and nontender. No organomegaly or masses felt. Normal bowel sounds heard. Central nervous system: Alert and oriented. Dense left hemiparesis Extremities: Symmetric no edema cyanosis or clubbing Skin: No rashes, lesions or ulcers Psychiatry: Judgement and insight appear normal. Mood & affect appropriate.     Data Reviewed: I have personally reviewed following labs and imaging studies  CBC:  Recent Labs Lab 12/03/16 0254 12/03/16 0304 12/04/16 0419  WBC 7.4  --  12.5*  NEUTROABS 3.4  --   --   HGB 13.3 13.3 13.5  HCT 40.5 39.0 41.9  MCV 84.7  --  85.2  PLT 259  --  307   Basic Metabolic Panel:  Recent Labs Lab 12/03/16 0254 12/03/16 0304 12/04/16 0419  NA 138 140 138  K 3.5 3.4* 3.8  CL 105 103 112*  CO2 24  --  20*  GLUCOSE 138* 135* 198*  BUN 12 12 8   CREATININE 0.86 0.90 0.84  CALCIUM 8.5*  --  8.4*   GFR: Estimated Creatinine Clearance: 93.9 mL/min (by C-G formula based on SCr of 0.84 mg/dL). Liver Function Tests:  Recent Labs Lab 12/03/16 0254 12/04/16 0419  AST 34 25  ALT 54 47  ALKPHOS 92 95  BILITOT 0.8 0.7  PROT 6.3* 6.4*  ALBUMIN 2.8* 2.7*   No results for input(s): LIPASE, AMYLASE in the last 168 hours. No results for input(s):  AMMONIA in the last 168 hours. Coagulation Profile:  Recent Labs Lab 12/03/16 0254  INR 0.94   Cardiac Enzymes: No results for input(s): CKTOTAL, CKMB, CKMBINDEX, TROPONINI in the last 168 hours. BNP (last 3 results) No results for input(s): PROBNP in the last 8760 hours. HbA1C: No results for input(s): HGBA1C in the last 72 hours. CBG:  Recent Labs Lab 12/03/16 0257  GLUCAP 134*   Lipid Profile:  Recent Labs  12/04/16 0419  CHOL 193  HDL 41  LDLCALC 138*  TRIG 70  CHOLHDL 4.7   Thyroid Function Tests:  Recent Labs  12/03/16 0705  TSH 1.119   Anemia Panel:  Recent Labs  12/03/16 0705  VITAMINB12 275   Sepsis Labs: No results for input(s): PROCALCITON, LATICACIDVEN in the last 168 hours.  No results found for this or any previous visit (from the past 240 hour(s)).       Radiology Studies: Ct Angio Head W Or Wo Contrast  Result Date: 12/03/2016 CLINICAL DATA:  Right MCA territory infarct.  Migraine headaches. EXAM: CT ANGIOGRAPHY HEAD AND NECK TECHNIQUE: Multidetector CT imaging of the head and neck was performed using the standard protocol during bolus administration of intravenous contrast. Multiplanar CT image reconstructions and MIPs were obtained to evaluate the vascular anatomy. Carotid stenosis measurements (when applicable) are obtained utilizing NASCET criteria, using the distal internal carotid diameter as the denominator. CONTRAST:  50 mL Isovue 370 was administered into the right antecubital fossa with contrast extravasation. Contrast extravasation consultation: Type of contrast: Isovue 370 site of extravasation: Right antecubital fossa Estimated volume of extravasation: <50 ml Area of extravasation scanned with CT? no PATIENT'S SIGNS AND SYMPTOMS Skin blistering/ulceration: no Decrease capillary refill: no Change in skin color: no Decreased motor function or severe tightness: Unable to assess due to stroke Decreased pulses distal to site of  extravasation: no Altered sensation: Unable to assess due to stroke Increasing pain or signs of increased swelling during observation: no TREATMENT Limb elevation: yes Ice packs applied: yes Heat pads applied: yes Plastic surgery consulted? no DOCUMENTATION AND FOLLOW-UP Site contrast extravasation forms submitted? yes Post extravasation orders completed? yes Was additional follow up assigned to PA's? Yes COMPARISON:  MRI brain from the same day at 11:55 a.m. CT head without contrast at 3:08 a.m. FINDINGS: CT HEAD FINDINGS Brain: Hypoattenuation involving the right temporal lobe, insular cortex, and basal ganglia matches the diffusion abnormality. There is effacement of the sulci. Midline shift is 2 mm. There is no hemorrhage. A hyperdense right MCA is noted. No new infarcts are present. Vascular: Hyperdense right MCA. Skull: The calvarium is intact. No focal lytic or blastic  lesions are present. Sinuses: Minimal mucosal thickening is present in the left maxillary sinus. The remaining paranasal sinuses and the mastoid air cells are clear. Orbits: The globes and orbits are within normal limits. Review of the MIP images confirms the above findings CTA NECK FINDINGS Aortic arch: There is a common origin of the left common carotid artery and the innominate artery. There is no focal stenosis of the great vessel origins at the aorta. No significant vascular calcifications are present at the aorta. Right carotid system: The right common carotid artery is within normal limits. Soft tissue plaque is noted along the posterior proximal right internal carotid artery at the bifurcation. There is a shelf-like plaque. The lumen is narrowed to 3 mm maximally. The more distal right internal carotid artery is within normal limits. Left carotid system: The left common carotid artery is within normal limits. Atherosclerotic changes are noted at the bifurcation. The cervical left internal carotid artery is normal. Vertebral arteries:  The vertebral arteries both originate from the subclavian arteries without significant stenoses. Both vertebral arteries are small without significant focal stenosis in the neck. Skeleton: Age advanced degenerative changes are noted at C5-6 and C6-7. Left greater than right uncovertebral spurring is present at C4-5. Vertebral body heights are maintained. No focal lytic or blastic lesions are present. Other neck: 11 mm inferior thyroid nodule is present on the left. No other discrete thyroid lesions are present. There is no significant adenopathy. No focal mucosal lesions are evident. The salivary glands are within normal limits. Upper chest: Ground-glass attenuation of the lung apices bilaterally likely reflects edema or atelectasis. No focal nodule or mass lesion is present. Review of the MIP images confirms the above findings CTA HEAD FINDINGS Anterior circulation: Internal carotid artery's are within normal limits through the ICA termini bilaterally. The A1 segments are patent. Anterior communicating artery is patent. There is a proximal occlusion of the right M1 segment. The left M1 segment is within normal limits. The left MCA bifurcation is unremarkable. Left MCA and right ACA branch vessels are within normal limits. Anterior and superior right MCA branch vessels perfuse. Posterior and inferior branch vessels are occluded. Posterior circulation: The vertebral arteries are small. PICA origins are visualized and is normal. The basilar artery is small, terminating at the superior cerebellar artery is. Fetal type posterior cerebral arteries are present bilaterally. The PCA branch vessels are within normal limits. Venous sinuses: The dural sinuses are patent. The right transverse sinus is dominant. The straight sinus deep cerebral veins are intact. Cortical veins are within normal limits. Anatomic variants: Bilateral fetal type posterior cerebral artery is. Delayed phase: The infarct is again noted. No pathologic  enhancement is evident. Review of the MIP images confirms the above findings IMPRESSION: 1. Proximal right M1 conclusion. 2. Expected evolution of right temporal lobe, insular cortex, and basal ganglia nonhemorrhagic infarction. 3. Focal irregularity at the right carotid bifurcation with a shelf-like soft tissue plaque but no significant stenosis relative to the more distal vessel. 4. More mild atherosclerotic changes at the left carotid bifurcation. 5. Fetal type posterior cerebral artery is bilaterally. 6. 11 mm thyroid nodule. Consider further evaluation with thyroid ultrasound. If patient is clinically hyperthyroid, consider nuclear medicine thyroid uptake and scan. Thyroid ultrasound can be done following discharge. These results were called by telephone at the time of interpretation on 12/03/2016 at 9:00 pm to Dr. Noel Christmas, who verbally acknowledged these results. Electronically Signed   By: Marin Roberts M.D.   On: 12/03/2016  21:15   Ct Angio Neck W Or Wo Contrast  Result Date: 12/03/2016 CLINICAL DATA:  Right MCA territory infarct.  Migraine headaches. EXAM: CT ANGIOGRAPHY HEAD AND NECK TECHNIQUE: Multidetector CT imaging of the head and neck was performed using the standard protocol during bolus administration of intravenous contrast. Multiplanar CT image reconstructions and MIPs were obtained to evaluate the vascular anatomy. Carotid stenosis measurements (when applicable) are obtained utilizing NASCET criteria, using the distal internal carotid diameter as the denominator. CONTRAST:  50 mL Isovue 370 was administered into the right antecubital fossa with contrast extravasation. Contrast extravasation consultation: Type of contrast: Isovue 370 site of extravasation: Right antecubital fossa Estimated volume of extravasation: <50 ml Area of extravasation scanned with CT? no PATIENT'S SIGNS AND SYMPTOMS Skin blistering/ulceration: no Decrease capillary refill: no Change in skin color: no  Decreased motor function or severe tightness: Unable to assess due to stroke Decreased pulses distal to site of extravasation: no Altered sensation: Unable to assess due to stroke Increasing pain or signs of increased swelling during observation: no TREATMENT Limb elevation: yes Ice packs applied: yes Heat pads applied: yes Plastic surgery consulted? no DOCUMENTATION AND FOLLOW-UP Site contrast extravasation forms submitted? yes Post extravasation orders completed? yes Was additional follow up assigned to PA's? Yes COMPARISON:  MRI brain from the same day at 11:55 a.m. CT head without contrast at 3:08 a.m. FINDINGS: CT HEAD FINDINGS Brain: Hypoattenuation involving the right temporal lobe, insular cortex, and basal ganglia matches the diffusion abnormality. There is effacement of the sulci. Midline shift is 2 mm. There is no hemorrhage. A hyperdense right MCA is noted. No new infarcts are present. Vascular: Hyperdense right MCA. Skull: The calvarium is intact. No focal lytic or blastic lesions are present. Sinuses: Minimal mucosal thickening is present in the left maxillary sinus. The remaining paranasal sinuses and the mastoid air cells are clear. Orbits: The globes and orbits are within normal limits. Review of the MIP images confirms the above findings CTA NECK FINDINGS Aortic arch: There is a common origin of the left common carotid artery and the innominate artery. There is no focal stenosis of the great vessel origins at the aorta. No significant vascular calcifications are present at the aorta. Right carotid system: The right common carotid artery is within normal limits. Soft tissue plaque is noted along the posterior proximal right internal carotid artery at the bifurcation. There is a shelf-like plaque. The lumen is narrowed to 3 mm maximally. The more distal right internal carotid artery is within normal limits. Left carotid system: The left common carotid artery is within normal limits. Atherosclerotic  changes are noted at the bifurcation. The cervical left internal carotid artery is normal. Vertebral arteries: The vertebral arteries both originate from the subclavian arteries without significant stenoses. Both vertebral arteries are small without significant focal stenosis in the neck. Skeleton: Age advanced degenerative changes are noted at C5-6 and C6-7. Left greater than right uncovertebral spurring is present at C4-5. Vertebral body heights are maintained. No focal lytic or blastic lesions are present. Other neck: 11 mm inferior thyroid nodule is present on the left. No other discrete thyroid lesions are present. There is no significant adenopathy. No focal mucosal lesions are evident. The salivary glands are within normal limits. Upper chest: Ground-glass attenuation of the lung apices bilaterally likely reflects edema or atelectasis. No focal nodule or mass lesion is present. Review of the MIP images confirms the above findings CTA HEAD FINDINGS Anterior circulation: Internal carotid artery's are within  normal limits through the ICA termini bilaterally. The A1 segments are patent. Anterior communicating artery is patent. There is a proximal occlusion of the right M1 segment. The left M1 segment is within normal limits. The left MCA bifurcation is unremarkable. Left MCA and right ACA branch vessels are within normal limits. Anterior and superior right MCA branch vessels perfuse. Posterior and inferior branch vessels are occluded. Posterior circulation: The vertebral arteries are small. PICA origins are visualized and is normal. The basilar artery is small, terminating at the superior cerebellar artery is. Fetal type posterior cerebral arteries are present bilaterally. The PCA branch vessels are within normal limits. Venous sinuses: The dural sinuses are patent. The right transverse sinus is dominant. The straight sinus deep cerebral veins are intact. Cortical veins are within normal limits. Anatomic  variants: Bilateral fetal type posterior cerebral artery is. Delayed phase: The infarct is again noted. No pathologic enhancement is evident. Review of the MIP images confirms the above findings IMPRESSION: 1. Proximal right M1 conclusion. 2. Expected evolution of right temporal lobe, insular cortex, and basal ganglia nonhemorrhagic infarction. 3. Focal irregularity at the right carotid bifurcation with a shelf-like soft tissue plaque but no significant stenosis relative to the more distal vessel. 4. More mild atherosclerotic changes at the left carotid bifurcation. 5. Fetal type posterior cerebral artery is bilaterally. 6. 11 mm thyroid nodule. Consider further evaluation with thyroid ultrasound. If patient is clinically hyperthyroid, consider nuclear medicine thyroid uptake and scan. Thyroid ultrasound can be done following discharge. These results were called by telephone at the time of interpretation on 12/03/2016 at 9:00 pm to Dr. Noel Christmas, who verbally acknowledged these results. Electronically Signed   By: Marin Roberts M.D.   On: 12/03/2016 21:15   Mr Brain Wo Contrast  Result Date: 12/03/2016 CLINICAL DATA:  Slurred speech and LEFT-sided weakness beginning earlier today. EXAM: MRI HEAD WITHOUT CONTRAST TECHNIQUE: Multiplanar, multiecho pulse sequences of the brain and surrounding structures were obtained without intravenous contrast. COMPARISON:  Noncontrast CT head earlier today. FINDINGS: Significant motion degradation. The study is diagnostic for the clinical indication, but small or subtle lesions could be overlooked. Brain: Large area of restricted diffusion, RIGHT MCA territory, involves the temporal lobe, small portion of the frontal lobe, insula, and basal ganglia, as well as the regional white matter. Findings consistent with acute cerebral infarction. This is not visible on prior CT. Early mass effect RIGHT to LEFT of 1-2 mm. Gradient sequence shows significant motion degradation,  pre occluding definitive excess but of hemorrhage. No gross lobar hemorrhage. No mass lesion, hydrocephalus, or extra-axial fluid. Vascular: Suspected RIGHT MCA M1 stenosis or occlusion. This is difficult to confirm due to motion degradation. Skull and upper cervical spine: Normal marrow signal. Sinuses/Orbits: Negative. Other: None. IMPRESSION: Motion degraded exam. Large RIGHT MCA territory nonhemorrhagic infarct as described. There is early mass effect RIGHT-to-LEFT of 1-2 mm. Close follow-up recommended. Within limits for detection on this motion degraded exam, there is no gross hemorrhage, although small areas could be overlooked. Consider CT follow-up. Suspected RIGHT MCA M1 stenosis or occlusion. This could be further assessed with CT angiography of the head and neck, but only after the patient is more cooperative. These results were called by telephone at the time of interpretation on 12/03/2016 at 1:04 pm to Dr. Wilford Corner, who verbally acknowledged these results. Electronically Signed   By: Elsie Stain M.D.   On: 12/03/2016 13:05   Dg Swallowing Func-speech Pathology  Result Date: 12/03/2016 Objective Swallowing Evaluation: Type  of Study: MBS-Modified Barium Swallow Study Patient Details Name: Danyell Shader MRN: [id number] Date of Birth: 11-Dec-1975 Today's Date: 12/03/2016 Time: SLP Start Time (ACUTE ONLY): 1238-SLP Stop Time (ACUTE ONLY): 1252 SLP Time Calculation (min) (ACUTE ONLY): 14 min Past Medical History: Past Medical History: Diagnosis Date . Migraine  Past Surgical History: Past Surgical History: Procedure Laterality Date . CESAREAN SECTION Bilateral 1995, 2002 HPI: 41 y.o. female w hx of migraines was at home around 11pm c/o headache and then had altered mental status; BSE completed and pt kept NPO d/t overt s/s of aspiration including delayed throat clearing/cough with thin liquids; anterior left loss; CT negative; MRI pending No Data Recorded Assessment / Plan / Recommendation [facility] IP CLINICAL  IMPRESSIONS 12/03/2016 Clinical Impression Pt presents with mild-moderate oropharyngeal dysphagia characterized by premature spillage to pyriform sinuses prior to swallow triggering d/t sensory impairment; anterior loss noted with liquids either via tsp and/or cup; chin tuck and head turn left attempted with larger volumes of thin and nectar-thickened liquids, but was inconsistent d/t pt lethargy, subsequently, larger volumes of thin entered laryngeal vestibule with cup sips and to level of the vocal folds with straw sips and ejected out; no penetration and/or aspiration noted with nectar-thickened liquids via cup/straw, but pt is at risk with larger amounts; mechanical/soft consistency attempted, but removed from oral cavity d/t impaired mastication/poor lingual control/manipulation paired with pt lethargy; recommend Dysphagia 1 (puree) with nectar-thickened liquids via individual straw sips to reduce anterior loss presented on right side pending pt alertness level and increased appetite with swallowing precautions in place during PO intake SLP Visit Diagnosis Dysphagia, oropharyngeal phase (R13.12) Attention and concentration deficit following -- Frontal lobe and executive function deficit following -- Impact on safety and function Moderate aspiration risk   [facility] IP TREATMENT RECOMMENDATION 12/03/2016 Treatment Recommendations Therapy as outlined in treatment plan below   Prognosis 12/03/2016 Prognosis for Safe Diet Advancement Good Barriers to Reach Goals -- Barriers/Prognosis Comment -- [facility] IP DIET RECOMMENDATION 12/03/2016 SLP Diet Recommendations Dysphagia 1 (Puree) solids;Nectar thick liquid Liquid Administration via Cup;Straw;Other (Comment) Medication Administration Crushed with puree Compensations Slow rate;Small sips/bites;Lingual sweep for clearance of pocketing;Monitor for anterior loss Postural Changes --   [facility] IP OTHER RECOMMENDATIONS 12/03/2016 Recommended Consults -- Oral Care Recommendations Oral care  BID Other Recommendations Order thickener from pharmacy   CHL IP FOLLOW UP RECOMMENDATIONS 12/03/2016 Follow up Recommendations Other (comment)   CHL IP FREQUENCY AND DURATION 12/03/2016 Speech Therapy Frequency (ACUTE ONLY) min 2x/week Treatment Duration 1 week      CHL IP ORAL PHASE 12/03/2016 Oral Phase Impaired Oral - Pudding Teaspoon -- Oral - Pudding Cup -- Oral - Honey Teaspoon -- Oral - Honey Cup -- Oral - Nectar Teaspoon Left anterior bolus loss;Premature spillage Oral - Nectar Cup -- Oral - Nectar Straw Premature spillage Oral - Thin Teaspoon Left anterior bolus loss;Premature spillage Oral - Thin Cup Left anterior bolus loss;Premature spillage Oral - Thin Straw Premature spillage Oral - Puree Premature spillage Oral - Mech Soft -- Oral - Regular -- Oral - Multi-Consistency -- Oral - Pill -- Oral Phase - Comment (No Data)  CH[facility] IP PHARYNGEAL PHASE 12/03/2016 Pharyngeal Phase Impaired Pharyngeal- Pudding Teaspoon -- Pharyngeal -- Pharyngeal- Pudding Cup -- Pharyngeal -- Pharyngeal- Honey Teaspoon -- Pharyngeal -- Pharyngeal- Honey Cup -- Pharyngeal -- Pharyngeal- Nectar Teaspoon Delayed swallow initiation-pyriform sinuses Pharyngeal Material does not enter airway Pharyngeal- Nectar Cup Delayed swallow initiation-pyriform sinuses Pharyngeal Material does not enter airway Pharyngeal- Nectar Straw Delayed swallow initiation-pyriform sinuses  Pharyngeal Material does not enter airway Pharyngeal- Thin Teaspoon Delayed swallow initiation-pyriform sinuses Pharyngeal Material enters airway, remains ABOVE vocal cords then ejected out Pharyngeal- Thin Cup Delayed swallow initiation-pyriform sinuses;Compensatory strategies attempted (chin tuck; head turn left) Pharyngeal Material enters airway, remains ABOVE vocal cords then ejected out Pharyngeal- Thin Straw Delayed swallow initiation-pyriform sinuses;Penetration/Aspiration before swallow;Trace aspiration;Compensatory strategies attempted (chin tuck/head turn left)  Pharyngeal Material enters airway, CONTACTS cords and then ejected out Pharyngeal- Puree Delayed swallow initiation-vallecula Pharyngeal -- Pharyngeal- Mechanical Soft NT Pharyngeal -- Pharyngeal- Regular -- Pharyngeal -- Pharyngeal- Multi-consistency -- Pharyngeal -- Pharyngeal- Pill -- Pharyngeal -- Pharyngeal Comment (No Data)  CH[facility] IP CERVICAL ESOPHAGEAL PHASE 12/03/2016 Cervical Esophageal Phase WFL Pudding Teaspoon -- Pudding Cup -- Honey Teaspoon -- Honey Cup -- Nectar Teaspoon -- Nectar Cup -- Nectar Straw -- Thin Teaspoon -- Thin Cup -- Thin Straw -- Puree -- Mechanical Soft -- Regular -- Multi-consistency -- Pill -- Cervical Esophageal Comment -- CHL IP GO 12/03/2016 Functional Assessment Tool Used NOMS; clinical judgment Functional Limitations Swallowing Swallow Current Status (Z6109) CK Swallow Goal Status (U0454) CJ Swallow Discharge Status (U9811) (None) Motor Speech Current Status (B1478) (None) Motor Speech Goal Status (G9562) (None) Motor Speech Goal Status (Z3086) (None) Spoken Language Comprehension Current Status (V7846) (None) Spoken Language Comprehension Goal Status (N6295) (None) Spoken Language Comprehension Discharge Status (M8413) (None) Spoken Language Expression Current Status (K4401) (None) Spoken Language Expression Goal Status (U2725) (None) Spoken Language Expression Discharge Status [phone]) (None) Attention Current Status (I3474) (None) Attention Goal Status (Q5956) (None) Attention Discharge Status (L8756) (None) Memory Current Status (E3329) (None) Memory Goal Status (J1884) (None) Memory Discharge Status (Z6606) (None) Voice Current Status (T0160) (None) Voice Goal Status (F0932) (None) Voice Discharge Status (T5573) (None) Other Speech-Language Pathology Functional Limitation Current Status (U2025) (None) Other Speech-Language Pathology Functional Limitation Goal Status (K2706) (None) Other Speech-Language Pathology Functional Limitation Discharge Status [id number]) (None) Tressie Stalker,  M.S., CCC-SLP 12/03/2016, 1:18 PM              Ct Head Code Stroke W/o Cm  Result Date: 12/03/2016 CLINICAL DATA:  Code stroke. Slurred speech and weakness. Last seen normal at 0200 hours. EXAM: CT HEAD WITHOUT CONTRAST TECHNIQUE: Contiguous axial images were obtained from the base of the skull through the vertex without intravenous contrast. COMPARISON:  None. FINDINGS: Mild motion degraded examination. BRAIN: No intraparenchymal hemorrhage, mass effect nor midline shift. The ventricles and sulci are normal. No acute large vascular territory infarcts. No abnormal extra-axial fluid collections. Basal cisterns are patent. VASCULAR: Unremarkable. SKULL/SOFT TISSUES: No skull fracture. No significant soft tissue swelling. ORBITS/SINUSES: The included ocular globes and orbital contents are normal.The mastoid aircells and included paranasal sinuses are well-aerated. Dehiscent RIGHT jugular bulb. OTHER: None. ASPECTS [facility] Stroke Program Early CT Score) - Ganglionic level infarction (caudate, lentiform nuclei, internal capsule, insula, M1-M3 cortex): 7 - Supraganglionic infarction (M4-M6 cortex): 3 Total score (0-10 with 10 being normal): 10 IMPRESSION: 1. Negative mildly motion degraded noncontrast CT HEAD. 2. ASPECTS is 10. 3. Critical Value/emergent results were called by telephone at the time of interpretation on 12/03/2016 at 3:15 am to Dr. Nicholas Lose, Neurology, who verbally acknowledged these results. Electronically Signed   By: Awilda Metro M.D.   On: 12/03/2016 03:17        Scheduled Meds: . aspirin EC  325 mg Oral Daily  . atorvastatin  40 mg Oral q1800  . enoxaparin (LOVENOX) injection  40 mg Subcutaneous Q24H  . sodium chloride flush  3 mL Intravenous  Q12H  . topiramate  50 mg Oral Daily   Continuous Infusions:   LOS: 1 day    Time spent: 40 minutes in care coordination and discussion with patient, family, and nursing staff.    Lahoma Crocker, MD [facility] Pager  [phone]  If 7PM-7AM, please contact night-coverage [email or web address] Password [facility] 12/04/2016, 12:39 PM

## 2016-12-04 NOTE — Progress Notes (Signed)
STROKE TEAM PROGRESS NOTE   HISTORY OF PRESENT ILLNESS (per record) Shannon Erickson is an 41 y.o. female who developed a migraine headache at about 11:30 pm.  She went to bed shortly after and woke up at 2 am with left sided weakness.  She says that she has had these types of headaches with weakness since childhood and current frequency is about 2x/month.  She is not on any migraine preventative medication for some unknown reason.  She usually takes Excedrin migraine to deal with them.  CT Brain was normal.  She has no other medical history or known vascular risk factors.  She is on no regular medications.  The migraine is described as right frontotemporal throbbing pain associated with photophobia and aggravated by movement. She has never been evaluated for this.     SUBJECTIVE (INTERVAL HISTORY) Her father is at the bedside.  Pt lethargic but Orientated, still has left facial droop and the left hemiparesis, however, left lower extremity stronger than yesterday.   OBJECTIVE Temp:  [97.8 F (36.6 C)-98.8 F (37.1 C)] 98 F (36.7 C) (07/15 1009) Pulse Rate:  [74-98] 97 (07/15 1009) Cardiac Rhythm: Normal sinus rhythm (07/15 0700) Resp:  [17-20] 17 (07/15 1009) BP: (112-139)/(58-89) 120/63 (07/15 1009) SpO2:  [96 %-100 %] 98 % (07/15 1009) Weight:  [88.3 kg (194 lb 10.7 oz)] 88.3 kg (194 lb 10.7 oz) (07/15 0525)  CBC:   Recent Labs Lab 12/03/16 0254 12/03/16 0304 12/04/16 0419  WBC 7.4  --  12.5*  NEUTROABS 3.4  --   --   HGB 13.3 13.3 13.5  HCT 40.5 39.0 41.9  MCV 84.7  --  85.2  PLT 259  --  307    Basic Metabolic Panel:   Recent Labs Lab 12/03/16 0254 12/03/16 0304 12/04/16 0419  NA 138 140 138  K 3.5 3.4* 3.8  CL 105 103 112*  CO2 24  --  20*  GLUCOSE 138* 135* 198*  BUN 12 12 8   CREATININE 0.86 0.90 0.84  CALCIUM 8.5*  --  8.4*    Lipid Panel:     Component Value Date/Time   CHOL 193 12/04/2016 0419   TRIG 70 12/04/2016 0419   HDL 41 12/04/2016 0419   CHOLHDL 4.7 12/04/2016 0419   VLDL 14 12/04/2016 0419   LDLCALC 138 (H) 12/04/2016 0419   HgbA1c: No results found for: HGBA1C Urine Drug Screen: No results found for: LABOPIA, COCAINSCRNUR, LABBENZ, AMPHETMU, THCU, LABBARB  Alcohol Level No results found for: [facility]  IMAGING I have personally reviewed the radiological images below and agree with the radiology interpretations.  Ct Angio Head W Or Wo Contrast Ct Angio Neck W Or Wo Contrast 12/03/2016 1. Proximal right M1 conclusion.  2. Expected evolution of right temporal lobe, insular cortex, and basal ganglia nonhemorrhagic infarction.  3. Focal irregularity at the right carotid bifurcation with a shelf-like soft tissue plaque but no significant stenosis relative to the more distal vessel.  4. More mild atherosclerotic changes at the left carotid bifurcation.  5. Fetal type posterior cerebral artery is bilaterally.  6. 11 mm thyroid nodule. Consider further evaluation with thyroid ultrasound. If patient is clinically hyperthyroid, consider nuclear medicine thyroid uptake and scan. Thyroid ultrasound can be done following discharge.   Mr Brain Wo Contrast 12/03/2016 Motion degraded exam.  Large RIGHT MCA territory nonhemorrhagic infarct as described.  There is early mass effect RIGHT-to-LEFT of 1-2 mm. Close follow-up recommended.  Within limits for detection on this motion degraded  exam, there is no gross hemorrhage, although small areas could be overlooked. Consider CT follow-up. Suspected RIGHT MCA M1 stenosis or occlusion. This could be further assessed with CT angiography of the head and neck, but only after the patient is more cooperative.      Ct Head Code Stroke W/o Cm 12/03/2016 1. Negative mildly motion degraded noncontrast CT HEAD.  2. ASPECTS is 10.   TTE pending  TCD bubble study pending  TCD emboli detection pending  LE venous Doppler pending  TEE pending   PHYSICAL EXAM  Temp:  [97.6 F (36.4 C)-98.8 F (37.1  C)] 97.6 F (36.4 C) (07/15 1356) Pulse Rate:  [74-98] 94 (07/15 1356) Resp:  [17-20] 17 (07/15 1356) BP: (112-139)/(55-89) 120/55 (07/15 1356) SpO2:  [96 %-100 %] 98 % (07/15 1356) Weight:  [194 lb 10.7 oz (88.3 kg)] 194 lb 10.7 oz (88.3 kg) (07/15 0525)  General - Well nourished, well developed, lethargic.  Ophthalmologic - Fundi not visualized due to noncooperation.  Cardiovascular - Regular rate and rhythm.  Mental Status -  Level of arousal and orientation to time, place, and person were intact. Language including expression, naming, repetition, comprehension was assessed and found intact. Fund of Knowledge was assessed and was intact.  Cranial Nerves II - XII - II - Visual field intact OU. III, IV, VI - Extraocular movements intact. V - Facial sensation intact bilaterally. VII - left lower facial weakness. VIII - Hearing & vestibular intact bilaterally. X - Palate elevates symmetrically, mild dysarthria. XI - Chin turning & shoulder shrug intact bilaterally. XII - Tongue protrusion to the left.  Motor Strength - The patient's strength was normal in RUE and RLE, however LUE 0/5, LLE proximal 2/5 and knee extension 3/5.  Bulk was normal and fasciculations were absent.   Motor Tone - Muscle tone was assessed at the neck and appendages and was normal.  Reflexes - The patient's reflexes were 1+ in all extremities and she had no pathological reflexes.  Sensory - Light touch, temperature/pinprick were assessed and were symmetrical.    Coordination - The patient had normal movements in the right hand with no ataxia or dysmetria.  Tremor was absent.  Gait and Station - not tested.   ASSESSMENT/PLAN [name] is a 41 y.o. female with history of migraine headaches presenting with left-sided weakness. 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  Resultant  left facial droop  and left hemiparesis   CT head - negative  MRI head - Large RIGHT MCA territory infarct with  MRA head - Suspected RIGHT MCA M1 stenosis or occlusion.   CTA H&N - Proximal right M1 conclusion.   TCD emboli detection - pending   TCDs with bubbles - pending  2D Echo - pending  TEE pending  LE venous duplex - pending  LDL - 138  HgbA1c - pending  Hypercoagulable workup pending  VTE prophylaxis - Lovenox DIET - DYS 1 Room service appropriate? Yes; Fluid consistency: Nectar Thick  No antithrombotic prior to admission, now on aspirin 325 mg daily.   Patient counseled to be compliant with her antithrombotic medications  Ongoing aggressive stroke risk factor management  Therapy recommendations: pending  Disposition: Pending  Hypertension  Stable  Permissive hypertension (OK if < 220/120) but gradually normalize in 5-7 days  Long-term BP goal normotensive  Hyperlipidemia  Home meds: No lipid lowering medications prior to admission  LDL 138, goal < 70  Now on Lipitor 40 mg daily  Continue statin at discharge  Other Stroke Risk Factors  Obesity, Body mass index is 34.48 kg/m., recommend weight loss, diet and exercise as appropriate   Migraines - chronic, 2 per months, lasting all day, using Excedrin for abortive therapy  Other Active Problems  Mild leukocytosis  Thyroid nodule - follow-up recommended  History of meningitis 17 years ago without residual deficit  Patient works as Development worker, [employer]  Marvel Plan, MD PhD Stroke Neurology 12/04/2016 2:56 PM    To contact Stroke Continuity provider, please refer to [email or web address]. After hours, contact General Neurology

## 2016-12-04 NOTE — Care Management (Signed)
I did a follow up with the patient to check her right antecubital after the contrast extravasation from last night. She said her arm was not hurting and was not sore. There is a little bruising around the area but nothing extreme. I told her that if it should began to hurt to please let her nurse know,

## 2016-12-04 NOTE — Progress Notes (Signed)
Patient reports new tingling sensation  in left arm.

## 2016-12-04 NOTE — Progress Notes (Signed)
Reexamined patient following extravasation of CT contrast into the right upper extremity yesterday.  The area is now soft and non tender.  No further treatment or follow-up is necessary for this extravasation.

## 2016-12-05 ENCOUNTER — Inpatient Hospital Stay (HOSPITAL_COMMUNITY): Payer: Medicaid Other

## 2016-12-05 ENCOUNTER — Encounter (HOSPITAL_COMMUNITY)
Admission: EM | Disposition: A | Discharge status: Rehabilitation Facility | Payer: Self-pay | Source: Home / Self Care | Attending: Internal Medicine

## 2016-12-05 ENCOUNTER — Inpatient Hospital Stay (HOSPITAL_COMMUNITY): Payer: Self-pay

## 2016-12-05 DIAGNOSIS — I639 Cerebral infarction, unspecified: Secondary | ICD-10-CM

## 2016-12-05 DIAGNOSIS — I638 Other cerebral infarction: Secondary | ICD-10-CM

## 2016-12-05 DIAGNOSIS — E785 Hyperlipidemia, unspecified: Secondary | ICD-10-CM

## 2016-12-05 DIAGNOSIS — E041 Nontoxic single thyroid nodule: Secondary | ICD-10-CM

## 2016-12-05 DIAGNOSIS — I6340 Cerebral infarction due to embolism of unspecified cerebral artery: Secondary | ICD-10-CM

## 2016-12-05 HISTORY — PX: TEE WITHOUT CARDIOVERSION: SHX5443

## 2016-12-05 LAB — BASIC METABOLIC PANEL
ANION GAP: 8 (ref 5–15)
BUN: 17 mg/dL (ref 6–20)
CHLORIDE: 107 mmol/L (ref 101–111)
CO2: 22 mmol/L (ref 22–32)
CREATININE: 0.9 mg/dL (ref 0.44–1.00)
Calcium: 8.1 mg/dL — ABNORMAL LOW (ref 8.9–10.3)
GFR calc non Af Amer: >60 mL/min (ref >60)
Glucose, Bld: 88 mg/dL (ref 65–99)
POTASSIUM: 3.6 mmol/L (ref 3.5–5.1)
SODIUM: 137 mmol/L (ref 135–145)

## 2016-12-05 LAB — CBC WITH DIFFERENTIAL/PLATELET
Basophils Absolute: 0 10*3/uL (ref 0.0–0.1)
Basophils Relative: 0 %
EOS ABS: 0.1 10*3/uL (ref 0.0–0.7)
Eosinophils Relative: 1 %
HCT: 42.1 % (ref 36.0–46.0)
HEMOGLOBIN: 13.5 g/dL (ref 12.0–15.0)
LYMPHS ABS: 2.3 10*3/uL (ref 0.7–4.0)
Lymphocytes Relative: 26 %
MCH: 27.6 pg (ref 26.0–34.0)
MCHC: 32.1 g/dL (ref 30.0–36.0)
MCV: 85.9 fL (ref 78.0–100.0)
Monocytes Absolute: 0.6 10*3/uL (ref 0.1–1.0)
Monocytes Relative: 7 %
NEUTROS PCT: 66 %
Neutro Abs: 5.9 10*3/uL (ref 1.7–7.7)
Platelets: 251 10*3/uL (ref 150–400)
RBC: 4.9 MIL/uL (ref 3.87–5.11)
RDW: 13.8 % (ref 11.5–15.5)
WBC: 8.9 10*3/uL (ref 4.0–10.5)

## 2016-12-05 LAB — ECHOCARDIOGRAM COMPLETE
HEIGHTINCHES: 63 in
Weight: 3153.46 oz

## 2016-12-05 LAB — MAGNESIUM: MAGNESIUM: 1.8 mg/dL (ref 1.7–2.4)

## 2016-12-05 SURGERY — ECHOCARDIOGRAM, TRANSESOPHAGEAL
Anesthesia: Moderate Sedation

## 2016-12-05 MED ORDER — MIDAZOLAM HCL 5 MG/ML IJ SOLN
INTRAMUSCULAR | Status: AC
  Filled 2016-12-05: qty 2

## 2016-12-05 MED ORDER — ASPIRIN 325 MG PO TABS
325.0000 mg | ORAL_TABLET | Freq: Every day | ORAL | Status: DC
  Administered 2016-12-05 – 2016-12-10 (×6): 325 mg via ORAL
  Filled 2016-12-05 (×6): qty 1

## 2016-12-05 MED ORDER — BUTAMBEN-TETRACAINE-BENZOCAINE 2-2-14 % EX AERO
INHALATION_SPRAY | CUTANEOUS | Status: DC | PRN
  Administered 2016-12-05: 2 via TOPICAL

## 2016-12-05 MED ORDER — FENTANYL CITRATE (PF) 100 MCG/2ML IJ SOLN
INTRAMUSCULAR | Status: DC | PRN
  Administered 2016-12-05: 25 ug via INTRAVENOUS

## 2016-12-05 MED ORDER — SODIUM CHLORIDE 0.9 % IV BOLUS (SEPSIS)
1000.0000 mL | Freq: Once | INTRAVENOUS | Status: AC
  Administered 2016-12-05: 1000 mL via INTRAVENOUS

## 2016-12-05 MED ORDER — CYANOCOBALAMIN 1000 MCG/ML IJ SOLN
1000.0000 ug | Freq: Every day | INTRAMUSCULAR | Status: DC
  Administered 2016-12-05 – 2016-12-10 (×6): 1000 ug via INTRAMUSCULAR
  Filled 2016-12-05 (×7): qty 1

## 2016-12-05 MED ORDER — MIDAZOLAM HCL 10 MG/2ML IJ SOLN
INTRAMUSCULAR | Status: DC | PRN
  Administered 2016-12-05: 2 mg via INTRAVENOUS

## 2016-12-05 MED ORDER — SODIUM CHLORIDE 0.9 % IV SOLN
INTRAVENOUS | Status: DC
  Administered 2016-12-05 – 2016-12-10 (×8): via INTRAVENOUS

## 2016-12-05 MED ORDER — FENTANYL CITRATE (PF) 100 MCG/2ML IJ SOLN
INTRAMUSCULAR | Status: AC
  Filled 2016-12-05: qty 2

## 2016-12-05 NOTE — Progress Notes (Signed)
    [facility] has been requested to perform a transesophageal echocardiogram on Shannon Erickson for cerebrovascular accident.  After careful review of history and examination, the risks and benefits of transesophageal echocardiogram have been explained including risks of esophageal damage, perforation (1:10,000 risk), bleeding, pharyngeal hematoma as well as other potential complications associated with conscious sedation including aspiration, arrhythmia, respiratory failure and death. Alternatives to treatment were discussed, questions were answered. Patient is willing to proceed.   Ellsworth LennoxBrittany M Marico Buckle, PA-C  12/05/2016 10:28 AM

## 2016-12-05 NOTE — Progress Notes (Addendum)
PROGRESS NOTE    Shannon Erickson  WUJ:[id number] DOB: [birth year] DOA: 12/03/2016 PCP: Patient, No Pcp Per    Brief Narrative:  Shannon Erickson a 41 y.o.female,w hx of migraines was at home around 11pm c/o headache and then had altered mental status Headache frontal and occipital. Very severe. Pt present to ED and had further complaints of left sided jerking and weakness. MRI of brain revealed multiple embolic strokes. Concern for Endocarditis so aspirin on hold.   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 Patient with a left facial droop and left hemiparesis. CT head negative. MRI head with large right MCA territory infarct. MRA head with suspected right MCA M1 stenosis or occlusion. CT angiogram head and neck with proximal right M1 occlusion. 2-D echo with EF of 60-65% with no source of emboli. TEE done was unremarkable negative for PFO or ASO or endocarditis. Lower extremity Dopplers negative for DVT. Hypercoagulable panel pending. Fasting lipid panel with LDL of 138. As endocarditis has been ruled out to be placed now on aspirin 325 mg daily for secondary stroke prophylaxis. Continue statin. Continue risk factor management. Permissive hypertension. Patient's blood pressure borderline to low and a such will give a fluid bolus and placed on normal saline at 100 mL per hour. PT/OT/SLP. Neurology following and appreciate input and recommendations.  #2 right ICA bifurcation plaque-like structure Concern as to whether this is the source of patient's stroke. Per neurology.  #3 hypertension Patient's blood pressure is low to borderline. Permissive hypertension. Will place on IV fluids as  blood pressure is currently soft.  #4 hyperlipidemia Fasting lipid panel with LDL of 138. Goal LDL less than 70. Continue Lipitor.  #5 thyroid nodule TSH within normal limits. Will likely need thyroid ultrasound and follow-up to be done in the outpatient setting.  #6 headache Likely secondary to problem #1. Continue Topamax.  #7 hypokalemia Repleted.  #8 left-sided weakness  likely secondary to problem #1. PT/OT.   DVT prophylaxis: Lovenox Code Status: Full Family Communication: Updated patient and family at bedside. Disposition Plan: CIR versus SNF once stroke workup is completed.   Consultants:   Neurology: Dr.Eshraghi 12/03/2016  Procedures:   2-D echo 12/05/2016  Transesophageal echo 12/05/2016  CT head 12/03/2016  MRI head 12/03/2016  CT angiogram head and neck 12/03/2016  Lower extremity Dopplers 12/05/2016  EEG 12/05/2016  TCD pending  Antimicrobials:   None   Subjective: Patient just returned from TEE and subsequently sleepy. Patient states no change in left-sided weakness.  Objective: Vitals:   12/05/16 1300 12/05/16 1310 12/05/16 1320 12/05/16 1340  BP: (!) 87/45 (!) 88/45 (!) 102/52 107/64  Pulse: 65 69 71 70  Resp: 14 15 14 16   Temp:    98.6 F (37 C)  TempSrc:    Oral  SpO2: 97% 97% 98% 99%  Weight:      Height:        Intake/Output Summary (Last 24 hours) at 12/05/16 1847 Last data filed at 12/04/16 2000  Gross per 24 hour  Intake                4 ml  Output  0 ml  Net                4 ml   Filed Weights   12/03/16 0602 12/04/16 0525 12/05/16 0500  Weight: 86.1 kg (189 lb 12.8 oz) 88.3 kg (194 lb 10.7 oz) 89.4 kg (197 lb 1.5 oz)    Examination:  General exam: Sleepy post TEE. Respiratory system: Clear to auscultation. Respiratory effort normal. Cardiovascular system: S1 & S2 heard, RRR. No JVD, murmurs, rubs, gallops or clicks. No pedal edema. Gastrointestinal system: Abdomen is nondistended, soft and  nontender. No organomegaly or masses felt. Normal bowel sounds heard. Central nervous system: Sleepy however easily arousable. Left-sided weakness. Extremities: Left-sided weakness. Skin: No rashes, lesions or ulcers Psychiatry: Judgement and insight appear normal. Mood & affect appropriate.     Data Reviewed: I have personally reviewed following labs and imaging studies  CBC:  Recent Labs Lab 12/03/16 0254 12/03/16 0304 12/04/16 0419 12/05/16 1516  WBC 7.4  --  12.5* 8.9  NEUTROABS 3.4  --   --  5.9  HGB 13.3 13.3 13.5 13.5  HCT 40.5 39.0 41.9 42.1  MCV 84.7  --  85.2 85.9  PLT 259  --  307 251   Basic Metabolic Panel:  Recent Labs Lab 12/03/16 0254 12/03/16 0304 12/04/16 0419 12/05/16 1516  NA 138 140 138 137  K 3.5 3.4* 3.8 3.6  CL 105 103 112* 107  CO2 24  --  20* 22  GLUCOSE 138* 135* 198* 88  BUN 12 12 8 17   CREATININE 0.86 0.90 0.84 0.90  CALCIUM 8.5*  --  8.4* 8.1*  MG  --   --   --  1.8   GFR: Estimated Creatinine Clearance: 88.1 mL/min (by C-G formula based on SCr of 0.9 mg/dL). Liver Function Tests:  Recent Labs Lab 12/03/16 0254 12/04/16 0419  AST 34 25  ALT 54 47  ALKPHOS 92 95  BILITOT 0.8 0.7  PROT 6.3* 6.4*  ALBUMIN 2.8* 2.7*   No results for input(s): LIPASE, AMYLASE in the last 168 hours. No results for input(s): AMMONIA in the last 168 hours. Coagulation Profile:  Recent Labs Lab 12/03/16 0254  INR 0.94   Cardiac Enzymes: No results for input(s): CKTOTAL, CKMB, CKMBINDEX, TROPONINI in the last 168 hours. BNP (last 3 results) No results for input(s): PROBNP in the last 8760 hours. HbA1C: No results for input(s): HGBA1C in the last 72 hours. CBG:  Recent Labs Lab 12/03/16 0257  GLUCAP 134*   Lipid Profile:  Recent Labs  12/04/16 0419  CHOL 193  HDL 41  LDLCALC 138*  TRIG 70  CHOLHDL 4.7   Thyroid Function Tests:  Recent Labs  12/03/16 0705  TSH 1.119   Anemia Panel:  Recent Labs  12/03/16 0705    VITAMINB12 275   Sepsis Labs: No results for input(s): PROCALCITON, LATICACIDVEN in the last 168 hours.  No results found for this or any previous visit (from the past 240 hour(s)).       Radiology Studies: Ct Angio Head W Or Wo Contrast  Result Date: 12/03/2016 CLINICAL DATA:  Right MCA territory infarct.  Migraine headaches. EXAM: CT ANGIOGRAPHY HEAD AND NECK TECHNIQUE: Multidetector CT imaging of the head and neck was performed using the standard protocol during bolus administration of intravenous contrast. Multiplanar CT image reconstructions and MIPs were obtained to evaluate the vascular anatomy. Carotid stenosis measurements (when applicable) are obtained utilizing NASCET criteria, using the distal internal carotid diameter as the denominator.  CONTRAST:  50 mL Isovue 370 was administered into the right antecubital fossa with contrast extravasation. Contrast extravasation consultation: Type of contrast: Isovue 370 site of extravasation: Right antecubital fossa Estimated volume of extravasation: <50 ml Area of extravasation scanned with CT? no PATIENT'S SIGNS AND SYMPTOMS Skin blistering/ulceration: no Decrease capillary refill: no Change in skin color: no Decreased motor function or severe tightness: Unable to assess due to stroke Decreased pulses distal to site of extravasation: no Altered sensation: Unable to assess due to stroke Increasing pain or signs of increased swelling during observation: no TREATMENT Limb elevation: yes Ice packs applied: yes Heat pads applied: yes Plastic surgery consulted? no DOCUMENTATION AND FOLLOW-UP Site contrast extravasation forms submitted? yes Post extravasation orders completed? yes Was additional follow up assigned to PA's? Yes COMPARISON:  MRI brain from the same day at 11:55 a.m. CT head without contrast at 3:08 a.m. FINDINGS: CT HEAD FINDINGS Brain: Hypoattenuation involving the right temporal lobe, insular cortex, and basal ganglia matches the  diffusion abnormality. There is effacement of the sulci. Midline shift is 2 mm. There is no hemorrhage. A hyperdense right MCA is noted. No new infarcts are present. Vascular: Hyperdense right MCA. Skull: The calvarium is intact. No focal lytic or blastic lesions are present. Sinuses: Minimal mucosal thickening is present in the left maxillary sinus. The remaining paranasal sinuses and the mastoid air cells are clear. Orbits: The globes and orbits are within normal limits. Review of the MIP images confirms the above findings CTA NECK FINDINGS Aortic arch: There is a common origin of the left common carotid artery and the innominate artery. There is no focal stenosis of the great vessel origins at the aorta. No significant vascular calcifications are present at the aorta. Right carotid system: The right common carotid artery is within normal limits. Soft tissue plaque is noted along the posterior proximal right internal carotid artery at the bifurcation. There is a shelf-like plaque. The lumen is narrowed to 3 mm maximally. The more distal right internal carotid artery is within normal limits. Left carotid system: The left common carotid artery is within normal limits. Atherosclerotic changes are noted at the bifurcation. The cervical left internal carotid artery is normal. Vertebral arteries: The vertebral arteries both originate from the subclavian arteries without significant stenoses. Both vertebral arteries are small without significant focal stenosis in the neck. Skeleton: Age advanced degenerative changes are noted at C5-6 and C6-7. Left greater than right uncovertebral spurring is present at C4-5. Vertebral body heights are maintained. No focal lytic or blastic lesions are present. Other neck: 11 mm inferior thyroid nodule is present on the left. No other discrete thyroid lesions are present. There is no significant adenopathy. No focal mucosal lesions are evident. The salivary glands are within normal  limits. Upper chest: Ground-glass attenuation of the lung apices bilaterally likely reflects edema or atelectasis. No focal nodule or mass lesion is present. Review of the MIP images confirms the above findings CTA HEAD FINDINGS Anterior circulation: Internal carotid artery's are within normal limits through the ICA termini bilaterally. The A1 segments are patent. Anterior communicating artery is patent. There is a proximal occlusion of the right M1 segment. The left M1 segment is within normal limits. The left MCA bifurcation is unremarkable. Left MCA and right ACA branch vessels are within normal limits. Anterior and superior right MCA branch vessels perfuse. Posterior and inferior branch vessels are occluded. Posterior circulation: The vertebral arteries are small. PICA origins are visualized and is normal. The basilar  artery is small, terminating at the superior cerebellar artery is. Fetal type posterior cerebral arteries are present bilaterally. The PCA branch vessels are within normal limits. Venous sinuses: The dural sinuses are patent. The right transverse sinus is dominant. The straight sinus deep cerebral veins are intact. Cortical veins are within normal limits. Anatomic variants: Bilateral fetal type posterior cerebral artery is. Delayed phase: The infarct is again noted. No pathologic enhancement is evident. Review of the MIP images confirms the above findings IMPRESSION: 1. Proximal right M1 conclusion. 2. Expected evolution of right temporal lobe, insular cortex, and basal ganglia nonhemorrhagic infarction. 3. Focal irregularity at the right carotid bifurcation with a shelf-like soft tissue plaque but no significant stenosis relative to the more distal vessel. 4. More mild atherosclerotic changes at the left carotid bifurcation. 5. Fetal type posterior cerebral artery is bilaterally. 6. 11 mm thyroid nodule. Consider further evaluation with thyroid ultrasound. If patient is clinically hyperthyroid,  consider nuclear medicine thyroid uptake and scan. Thyroid ultrasound can be done following discharge. These results were called by telephone at the time of interpretation on 12/03/2016 at 9:00 pm to Dr. Noel Christmas, who verbally acknowledged these results. Electronically Signed   By: Marin Roberts M.D.   On: 12/03/2016 21:15   Ct Angio Neck W Or Wo Contrast  Result Date: 12/03/2016 CLINICAL DATA:  Right MCA territory infarct.  Migraine headaches. EXAM: CT ANGIOGRAPHY HEAD AND NECK TECHNIQUE: Multidetector CT imaging of the head and neck was performed using the standard protocol during bolus administration of intravenous contrast. Multiplanar CT image reconstructions and MIPs were obtained to evaluate the vascular anatomy. Carotid stenosis measurements (when applicable) are obtained utilizing NASCET criteria, using the distal internal carotid diameter as the denominator. CONTRAST:  50 mL Isovue 370 was administered into the right antecubital fossa with contrast extravasation. Contrast extravasation consultation: Type of contrast: Isovue 370 site of extravasation: Right antecubital fossa Estimated volume of extravasation: <50 ml Area of extravasation scanned with CT? no PATIENT'S SIGNS AND SYMPTOMS Skin blistering/ulceration: no Decrease capillary refill: no Change in skin color: no Decreased motor function or severe tightness: Unable to assess due to stroke Decreased pulses distal to site of extravasation: no Altered sensation: Unable to assess due to stroke Increasing pain or signs of increased swelling during observation: no TREATMENT Limb elevation: yes Ice packs applied: yes Heat pads applied: yes Plastic surgery consulted? no DOCUMENTATION AND FOLLOW-UP Site contrast extravasation forms submitted? yes Post extravasation orders completed? yes Was additional follow up assigned to PA's? Yes COMPARISON:  MRI brain from the same day at 11:55 a.m. CT head without contrast at 3:08 a.m. FINDINGS: CT HEAD  FINDINGS Brain: Hypoattenuation involving the right temporal lobe, insular cortex, and basal ganglia matches the diffusion abnormality. There is effacement of the sulci. Midline shift is 2 mm. There is no hemorrhage. A hyperdense right MCA is noted. No new infarcts are present. Vascular: Hyperdense right MCA. Skull: The calvarium is intact. No focal lytic or blastic lesions are present. Sinuses: Minimal mucosal thickening is present in the left maxillary sinus. The remaining paranasal sinuses and the mastoid air cells are clear. Orbits: The globes and orbits are within normal limits. Review of the MIP images confirms the above findings CTA NECK FINDINGS Aortic arch: There is a common origin of the left common carotid artery and the innominate artery. There is no focal stenosis of the great vessel origins at the aorta. No significant vascular calcifications are present at the aorta. Right carotid system: The  right common carotid artery is within normal limits. Soft tissue plaque is noted along the posterior proximal right internal carotid artery at the bifurcation. There is a shelf-like plaque. The lumen is narrowed to 3 mm maximally. The more distal right internal carotid artery is within normal limits. Left carotid system: The left common carotid artery is within normal limits. Atherosclerotic changes are noted at the bifurcation. The cervical left internal carotid artery is normal. Vertebral arteries: The vertebral arteries both originate from the subclavian arteries without significant stenoses. Both vertebral arteries are small without significant focal stenosis in the neck. Skeleton: Age advanced degenerative changes are noted at C5-6 and C6-7. Left greater than right uncovertebral spurring is present at C4-5. Vertebral body heights are maintained. No focal lytic or blastic lesions are present. Other neck: 11 mm inferior thyroid nodule is present on the left. No other discrete thyroid lesions are present. There  is no significant adenopathy. No focal mucosal lesions are evident. The salivary glands are within normal limits. Upper chest: Ground-glass attenuation of the lung apices bilaterally likely reflects edema or atelectasis. No focal nodule or mass lesion is present. Review of the MIP images confirms the above findings CTA HEAD FINDINGS Anterior circulation: Internal carotid artery's are within normal limits through the ICA termini bilaterally. The A1 segments are patent. Anterior communicating artery is patent. There is a proximal occlusion of the right M1 segment. The left M1 segment is within normal limits. The left MCA bifurcation is unremarkable. Left MCA and right ACA branch vessels are within normal limits. Anterior and superior right MCA branch vessels perfuse. Posterior and inferior branch vessels are occluded. Posterior circulation: The vertebral arteries are small. PICA origins are visualized and is normal. The basilar artery is small, terminating at the superior cerebellar artery is. Fetal type posterior cerebral arteries are present bilaterally. The PCA branch vessels are within normal limits. Venous sinuses: The dural sinuses are patent. The right transverse sinus is dominant. The straight sinus deep cerebral veins are intact. Cortical veins are within normal limits. Anatomic variants: Bilateral fetal type posterior cerebral artery is. Delayed phase: The infarct is again noted. No pathologic enhancement is evident. Review of the MIP images confirms the above findings IMPRESSION: 1. Proximal right M1 conclusion. 2. Expected evolution of right temporal lobe, insular cortex, and basal ganglia nonhemorrhagic infarction. 3. Focal irregularity at the right carotid bifurcation with a shelf-like soft tissue plaque but no significant stenosis relative to the more distal vessel. 4. More mild atherosclerotic changes at the left carotid bifurcation. 5. Fetal type posterior cerebral artery is bilaterally. 6. 11 mm  thyroid nodule. Consider further evaluation with thyroid ultrasound. If patient is clinically hyperthyroid, consider nuclear medicine thyroid uptake and scan. Thyroid ultrasound can be done following discharge. These results were called by telephone at the time of interpretation on 12/03/2016 at 9:00 pm to Dr. Noel Christmas, who verbally acknowledged these results. Electronically Signed   By: Marin Roberts M.D.   On: 12/03/2016 21:15        Scheduled Meds: . aspirin  325 mg Oral Daily  . atorvastatin  40 mg Oral q1800  . enoxaparin (LOVENOX) injection  40 mg Subcutaneous Q24H  . sodium chloride flush  3 mL Intravenous Q12H  . topiramate  50 mg Oral Daily   Continuous Infusions: . sodium chloride    . sodium chloride       LOS: 2 days    Time spent: 35 minutes    Najat Olazabal, MD [facility]  Pager [phone]  If 7PM-7AM, please contact night-coverage [email or web address] Password [facility] 12/05/2016, 6:47 PM

## 2016-12-05 NOTE — Progress Notes (Signed)
SLP Cancellation Note  Patient Details Name: [name] MRN: [id number] DOB: 01/13/1976   Cancelled treatment:       Reason Eval/Treat Not Completed: Other (comment) Pt was NPO this morning pending procedure, then off the floor. Will return as able. MD, please consider ordering SLP cognitive-linguistic evaluation and PT/OT evals per stroke protocol.    Maxcine Hamaiewonsky, Yama Nielson 12/05/2016, 2:06 PM  Maxcine HamLaura Paiewonsky, M.A. CCC-SLP [phone]

## 2016-12-05 NOTE — H&P (View-Only) (Signed)
STROKE TEAM PROGRESS NOTE   HISTORY OF PRESENT ILLNESS (per record) Shannon Erickson is an 41 y.o. female who developed a migraine headache at about 11:30 pm.  She went to bed shortly after and woke up at 2 am with left sided weakness.  She says that she has had these types of headaches with weakness since childhood and current frequency is about 2x/month.  She is not on any migraine preventative medication for some unknown reason.  She usually takes Excedrin migraine to deal with them.  CT Brain was normal.  She has no other medical history or known vascular risk factors.  She is on no regular medications.  The migraine is described as right frontotemporal throbbing pain associated with photophobia and aggravated by movement. She has never been evaluated for this.     SUBJECTIVE (INTERVAL HISTORY) Her father is at the bedside.  Pt lethargic but Orientated, still has left facial droop and the left hemiparesis, however, left lower extremity stronger than yesterday.   OBJECTIVE Temp:  [97.8 F (36.6 C)-98.8 F (37.1 C)] 98 F (36.7 C) (07/15 1009) Pulse Rate:  [74-98] 97 (07/15 1009) Cardiac Rhythm: Normal sinus rhythm (07/15 0700) Resp:  [17-20] 17 (07/15 1009) BP: (112-139)/(58-89) 120/63 (07/15 1009) SpO2:  [96 %-100 %] 98 % (07/15 1009) Weight:  [88.3 kg (194 lb 10.7 oz)] 88.3 kg (194 lb 10.7 oz) (07/15 0525)  CBC:   Recent Labs Lab 12/03/16 0254 12/03/16 0304 12/04/16 0419  WBC 7.4  --  12.5*  NEUTROABS 3.4  --   --   HGB 13.3 13.3 13.5  HCT 40.5 39.0 41.9  MCV 84.7  --  85.2  PLT 259  --  307    Basic Metabolic Panel:   Recent Labs Lab 12/03/16 0254 12/03/16 0304 12/04/16 0419  NA 138 140 138  K 3.5 3.4* 3.8  CL 105 103 112*  CO2 24  --  20*  GLUCOSE 138* 135* 198*  BUN 12 12 8   CREATININE 0.86 0.90 0.84  CALCIUM 8.5*  --  8.4*    Lipid Panel:     Component Value Date/Time   CHOL 193 12/04/2016 0419   TRIG 70 12/04/2016 0419   HDL 41 12/04/2016 0419   CHOLHDL 4.7 12/04/2016 0419   VLDL 14 12/04/2016 0419   LDLCALC 138 (H) 12/04/2016 0419   HgbA1c: No results found for: HGBA1C Urine Drug Screen: No results found for: LABOPIA, COCAINSCRNUR, LABBENZ, AMPHETMU, THCU, LABBARB  Alcohol Level No results found for: [facility]  IMAGING I have personally reviewed the radiological images below and agree with the radiology interpretations.  Ct Angio Head W Or Wo Contrast Ct Angio Neck W Or Wo Contrast 12/03/2016 1. Proximal right M1 conclusion.  2. Expected evolution of right temporal lobe, insular cortex, and basal ganglia nonhemorrhagic infarction.  3. Focal irregularity at the right carotid bifurcation with a shelf-like soft tissue plaque but no significant stenosis relative to the more distal vessel.  4. More mild atherosclerotic changes at the left carotid bifurcation.  5. Fetal type posterior cerebral artery is bilaterally.  6. 11 mm thyroid nodule. Consider further evaluation with thyroid ultrasound. If patient is clinically hyperthyroid, consider nuclear medicine thyroid uptake and scan. Thyroid ultrasound can be done following discharge.   Mr Brain Wo Contrast 12/03/2016 Motion degraded exam.  Large RIGHT MCA territory nonhemorrhagic infarct as described.  There is early mass effect RIGHT-to-LEFT of 1-2 mm. Close follow-up recommended.  Within limits for detection on this motion degraded  exam, there is no gross hemorrhage, although small areas could be overlooked. Consider CT follow-up. Suspected RIGHT MCA M1 stenosis or occlusion. This could be further assessed with CT angiography of the head and neck, but only after the patient is more cooperative.      Ct Head Code Stroke W/o Cm 12/03/2016 1. Negative mildly motion degraded noncontrast CT HEAD.  2. ASPECTS is 10.   TTE pending  TCD bubble study pending  TCD emboli detection pending  LE venous Doppler pending  TEE pending   PHYSICAL EXAM  Temp:  [97.6 F (36.4 C)-98.8 F (37.1  C)] 97.6 F (36.4 C) (07/15 1356) Pulse Rate:  [74-98] 94 (07/15 1356) Resp:  [17-20] 17 (07/15 1356) BP: (112-139)/(55-89) 120/55 (07/15 1356) SpO2:  [96 %-100 %] 98 % (07/15 1356) Weight:  [194 lb 10.7 oz (88.3 kg)] 194 lb 10.7 oz (88.3 kg) (07/15 0525)  General - Well nourished, well developed, lethargic.  Ophthalmologic - Fundi not visualized due to noncooperation.  Cardiovascular - Regular rate and rhythm.  Mental Status -  Level of arousal and orientation to time, place, and person were intact. Language including expression, naming, repetition, comprehension was assessed and found intact. Fund of Knowledge was assessed and was intact.  Cranial Nerves II - XII - II - Visual field intact OU. III, IV, VI - Extraocular movements intact. V - Facial sensation intact bilaterally. VII - left lower facial weakness. VIII - Hearing & vestibular intact bilaterally. X - Palate elevates symmetrically, mild dysarthria. XI - Chin turning & shoulder shrug intact bilaterally. XII - Tongue protrusion to the left.  Motor Strength - The patient's strength was normal in RUE and RLE, however LUE 0/5, LLE proximal 2/5 and knee extension 3/5.  Bulk was normal and fasciculations were absent.   Motor Tone - Muscle tone was assessed at the neck and appendages and was normal.  Reflexes - The patient's reflexes were 1+ in all extremities and she had no pathological reflexes.  Sensory - Light touch, temperature/pinprick were assessed and were symmetrical.    Coordination - The patient had normal movements in the right hand with no ataxia or dysmetria.  Tremor was absent.  Gait and Station - not tested.   ASSESSMENT/PLAN [name] is a 41 y.o. female with history of migraine headaches presenting with left-sided weakness. 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  Resultant  left facial droop  and left hemiparesis   CT head - negative  MRI head - Large RIGHT MCA territory infarct with  MRA head - Suspected RIGHT MCA M1 stenosis or occlusion.   CTA H&N - Proximal right M1 conclusion.   TCD emboli detection - pending   TCDs with bubbles - pending  2D Echo - pending  TEE pending  LE venous duplex - pending  LDL - 138  HgbA1c - pending  Hypercoagulable workup pending  VTE prophylaxis - Lovenox DIET - DYS 1 Room service appropriate? Yes; Fluid consistency: Nectar Thick  No antithrombotic prior to admission, now on aspirin 325 mg daily.   Patient counseled to be compliant with her antithrombotic medications  Ongoing aggressive stroke risk factor management  Therapy recommendations: pending  Disposition: Pending  Hypertension  Stable  Permissive hypertension (OK if < 220/120) but gradually normalize in 5-7 days  Long-term BP goal normotensive  Hyperlipidemia  Home meds: No lipid lowering medications prior to admission  LDL 138, goal < 70  Now on Lipitor 40 mg daily  Continue statin at discharge  Other Stroke Risk Factors  Obesity, Body mass index is 34.48 kg/m., recommend weight loss, diet and exercise as appropriate   Migraines - chronic, 2 per months, lasting all day, using Excedrin for abortive therapy  Other Active Problems  Mild leukocytosis  Thyroid nodule - follow-up recommended  History of meningitis 17 years ago without residual deficit  Patient works as Development worker, [employer]  Marvel Plan, MD PhD Stroke Neurology 12/04/2016 2:56 PM    To contact Stroke Continuity provider, please refer to [email or web address]. After hours, contact General Neurology

## 2016-12-05 NOTE — Procedures (Signed)
ELECTROENCEPHALOGRAM REPORT  Date of Study: 12/05/2016  Patient's Name: Arlyce DiceCynthia Ofallon MRN: [id number] Date of Birth: [birth year]  Referring Provider: Pearson GrippeJames Kim, MD  Clinical History: 41 year old female with left sided weakness and jerking, found to have a right MCA territory stroke.  Medications: aspirin EC tablet 325 mg  atorvastatin (LIPITOR) tablet 40 mg  enoxaparin (LOVENOX) injection 40 mg  ketorolac (TORADOL) 30 MG/ML injection 30 mg  sodium chloride flush (NS) 0.9 % injection 3 mL  topiramate (TOPAMAX) tablet 50 mg   Technical Summary: A multichannel digital EEG recording measured by the international 10-20 system with electrodes applied with paste and impedances below 5000 ohms performed as portable with EKG monitoring in an awake and drowsy patient.  Hyperventilation was not performed.  Photic stimulation was performed.  The digital EEG was referentially recorded, reformatted, and digitally filtered in a variety of bipolar and referential montages for optimal display.   Description: The patient is awake and drowsy during the recording.  During maximal wakefulness, there is a symmetric, medium voltage 7 Hz posterior dominant rhythm that attenuates with eye opening. This is admixed with diffuse low-amplitude 4-5 Hz theta and 2-3 Hz delta slowing of the waking background.  There is superimposed focal 2-3 Hz delta slowing in the right hemisphere.  Stage 2 sleep is not seen.  Photic stimulation did not elicit any abnormalities.  There were no epileptiform discharges or electrographic seizures seen.    EKG lead was unremarkable.  Impression: This awake and drowsy EEG is abnormal due to: 1.  Right hemispheric slowing 2.   diffuse slowing of the waking background.  Clinical Correlation of the above findings indicates: 1.  Focal structural or other physiologic abnormality in the right hemisphere,  Consistent with location of stroke. 2.  diffuse cerebral dysfunction that is  non-specific in etiology and can be seen with hypoxic/ischemic injury, toxic/metabolic encephalopathies, neurodegenerative disorders, or medication effect.    Shon MilletAdam Jaffe, DO

## 2016-12-05 NOTE — Progress Notes (Signed)
  Echocardiogram 2D Echocardiogram has been performed.  Tye SavoyCasey N Terrel Nesheiwat 12/05/2016, 11:11 AM

## 2016-12-05 NOTE — Progress Notes (Signed)
VASCULAR LAB PRELIMINARY  PRELIMINARY  PRELIMINARY  PRELIMINARY  Bilateral lower extremity venous duplex completed.    Preliminary report:  Bilateral:  No evidence of DVT, superficial thrombosis, or Baker's Cyst.   Aishani Kalis, RVS 12/05/2016, 11:51 AM

## 2016-12-05 NOTE — Consult Note (Signed)
Physical Medicine and Rehabilitation Consult Reason for Consult: Left side weakness Referring Physician: [facility]   HPI: Shannon Erickson is a 41 y.o. right handed female with history of migraine headaches. Per chart review and daughter-in-law, patient lives with family. Two-level home with bedroom first floor. Independent prior to admission. Presented 12/03/2016 with left-sided weakness and jerking movement as well as headache. CT/MRI reviewed, showing right MCA CVA.  Per report, large right MCA territory nonhemorrhagic infarct. Patient did not receive TPA. CT Angio head and neck showed a proximal right M1 occlusion. Incidental finding of a 11 mm thyroid nodule. Patient did not receive TPA. Echocardiogram and lower extremity Dopplers are pending. EEG pending. Aspirin for CVA prophylaxis. Subcutaneous Lovenox for DVT prophylaxis. Neurology follow-up with workup ongoing. Formal physical and occupational therapy evaluations pending. M.D. has requested physical medicine rehabilitation consult. Per daughter-in-law, pt was given sedatives yesterday for TEE and has been difficult to arouse since.   Review of Systems  Unable to perform ROS: Other  Extremely somnolent  Past Medical History:  Diagnosis Date  . Migraine    Past Surgical History:  Procedure Laterality Date  . CESAREAN SECTION Bilateral 1995, 2002   History reviewed. No pertinent family history., unable to question patient Social History:  reports that she has never smoked. She has never used smokeless tobacco. She reports that she does not drink alcohol. Her drug history is not on file. Allergies:  Allergies  Allergen Reactions  . Penicillins Other (See Comments)    unknown   Medications Prior to Admission  Medication Sig Dispense Refill  . aspirin-acetaminophen-caffeine (EXCEDRIN MIGRAINE) 250-250-65 MG tablet Take 1 tablet by mouth every 6 (six) hours as needed for headache.      Home: Home Living Family/patient  expects to be discharged to:: Private residence Living Arrangements: Children, Other relatives  Functional History:   Functional Status:  Mobility:          ADL:    Cognition: Cognition Orientation Level: Oriented X4    Blood pressure (!) 111/48, pulse 91, temperature 98.5 F (36.9 C), temperature source Axillary, resp. rate 18, height 5\' 3"  (1.6 m), weight 88.3 kg (194 lb 10.7 oz), SpO2 98 %. Physical Exam  Vitals reviewed. Constitutional: She appears well-developed and well-nourished.  HENT:  Head: Normocephalic and atraumatic.  Eyes: EOM are normal. Right eye exhibits no discharge. Left eye exhibits no discharge.  Neck: Normal range of motion. Neck supple. No thyromegaly present.  Cardiovascular: Normal rate and regular rhythm.   Respiratory: Effort normal and breath sounds normal. No respiratory distress.  GI: Soft. Bowel sounds are normal. She exhibits no distension.  Musculoskeletal: She exhibits no edema or tenderness.  Neurological:  Lethargic  Unable to accurately assess orientation or MMT due to lack of participation, however, see moving RUE.  Skin: Skin is warm and dry.  Psychiatric:  Unable to assess due to somnolence    Results for orders placed or performed during the hospital encounter of 12/03/16 (from the past 24 hour(s))  Antithrombin III     Status: None   Collection Time: 12/04/16  1:16 PM  Result Value Ref Range   AntiThromb III Func 113 75 - 120 %   Ct Angio Head W Or Wo Contrast  Result Date: 12/03/2016 CLINICAL DATA:  Right MCA territory infarct.  Migraine headaches. EXAM: CT ANGIOGRAPHY HEAD AND NECK TECHNIQUE: Multidetector CT imaging of the head and neck was performed using the standard protocol during bolus administration of intravenous contrast.  Multiplanar CT image reconstructions and MIPs were obtained to evaluate the vascular anatomy. Carotid stenosis measurements (when applicable) are obtained utilizing NASCET criteria, using the distal  internal carotid diameter as the denominator. CONTRAST:  50 mL Isovue 370 was administered into the right antecubital fossa with contrast extravasation. Contrast extravasation consultation: Type of contrast: Isovue 370 site of extravasation: Right antecubital fossa Estimated volume of extravasation: <50 ml Area of extravasation scanned with CT? no PATIENT'S SIGNS AND SYMPTOMS Skin blistering/ulceration: no Decrease capillary refill: no Change in skin color: no Decreased motor function or severe tightness: Unable to assess due to stroke Decreased pulses distal to site of extravasation: no Altered sensation: Unable to assess due to stroke Increasing pain or signs of increased swelling during observation: no TREATMENT Limb elevation: yes Ice packs applied: yes Heat pads applied: yes Plastic surgery consulted? no DOCUMENTATION AND FOLLOW-UP Site contrast extravasation forms submitted? yes Post extravasation orders completed? yes Was additional follow up assigned to PA's? Yes COMPARISON:  MRI brain from the same day at 11:55 a.m. CT head without contrast at 3:08 a.m. FINDINGS: CT HEAD FINDINGS Brain: Hypoattenuation involving the right temporal lobe, insular cortex, and basal ganglia matches the diffusion abnormality. There is effacement of the sulci. Midline shift is 2 mm. There is no hemorrhage. A hyperdense right MCA is noted. No new infarcts are present. Vascular: Hyperdense right MCA. Skull: The calvarium is intact. No focal lytic or blastic lesions are present. Sinuses: Minimal mucosal thickening is present in the left maxillary sinus. The remaining paranasal sinuses and the mastoid air cells are clear. Orbits: The globes and orbits are within normal limits. Review of the MIP images confirms the above findings CTA NECK FINDINGS Aortic arch: There is a common origin of the left common carotid artery and the innominate artery. There is no focal stenosis of the great vessel origins at the aorta. No significant  vascular calcifications are present at the aorta. Right carotid system: The right common carotid artery is within normal limits. Soft tissue plaque is noted along the posterior proximal right internal carotid artery at the bifurcation. There is a shelf-like plaque. The lumen is narrowed to 3 mm maximally. The more distal right internal carotid artery is within normal limits. Left carotid system: The left common carotid artery is within normal limits. Atherosclerotic changes are noted at the bifurcation. The cervical left internal carotid artery is normal. Vertebral arteries: The vertebral arteries both originate from the subclavian arteries without significant stenoses. Both vertebral arteries are small without significant focal stenosis in the neck. Skeleton: Age advanced degenerative changes are noted at C5-6 and C6-7. Left greater than right uncovertebral spurring is present at C4-5. Vertebral body heights are maintained. No focal lytic or blastic lesions are present. Other neck: 11 mm inferior thyroid nodule is present on the left. No other discrete thyroid lesions are present. There is no significant adenopathy. No focal mucosal lesions are evident. The salivary glands are within normal limits. Upper chest: Ground-glass attenuation of the lung apices bilaterally likely reflects edema or atelectasis. No focal nodule or mass lesion is present. Review of the MIP images confirms the above findings CTA HEAD FINDINGS Anterior circulation: Internal carotid artery's are within normal limits through the ICA termini bilaterally. The A1 segments are patent. Anterior communicating artery is patent. There is a proximal occlusion of the right M1 segment. The left M1 segment is within normal limits. The left MCA bifurcation is unremarkable. Left MCA and right ACA branch vessels are within normal  limits. Anterior and superior right MCA branch vessels perfuse. Posterior and inferior branch vessels are occluded. Posterior  circulation: The vertebral arteries are small. PICA origins are visualized and is normal. The basilar artery is small, terminating at the superior cerebellar artery is. Fetal type posterior cerebral arteries are present bilaterally. The PCA branch vessels are within normal limits. Venous sinuses: The dural sinuses are patent. The right transverse sinus is dominant. The straight sinus deep cerebral veins are intact. Cortical veins are within normal limits. Anatomic variants: Bilateral fetal type posterior cerebral artery is. Delayed phase: The infarct is again noted. No pathologic enhancement is evident. Review of the MIP images confirms the above findings IMPRESSION: 1. Proximal right M1 conclusion. 2. Expected evolution of right temporal lobe, insular cortex, and basal ganglia nonhemorrhagic infarction. 3. Focal irregularity at the right carotid bifurcation with a shelf-like soft tissue plaque but no significant stenosis relative to the more distal vessel. 4. More mild atherosclerotic changes at the left carotid bifurcation. 5. Fetal type posterior cerebral artery is bilaterally. 6. 11 mm thyroid nodule. Consider further evaluation with thyroid ultrasound. If patient is clinically hyperthyroid, consider nuclear medicine thyroid uptake and scan. Thyroid ultrasound can be done following discharge. These results were called by telephone at the time of interpretation on 12/03/2016 at 9:00 pm to Dr. Noel Christmas, who verbally acknowledged these results. Electronically Signed   By: Marin Roberts M.D.   On: 12/03/2016 21:15   Ct Angio Neck W Or Wo Contrast  Result Date: 12/03/2016 CLINICAL DATA:  Right MCA territory infarct.  Migraine headaches. EXAM: CT ANGIOGRAPHY HEAD AND NECK TECHNIQUE: Multidetector CT imaging of the head and neck was performed using the standard protocol during bolus administration of intravenous contrast. Multiplanar CT image reconstructions and MIPs were obtained to evaluate the  vascular anatomy. Carotid stenosis measurements (when applicable) are obtained utilizing NASCET criteria, using the distal internal carotid diameter as the denominator. CONTRAST:  50 mL Isovue 370 was administered into the right antecubital fossa with contrast extravasation. Contrast extravasation consultation: Type of contrast: Isovue 370 site of extravasation: Right antecubital fossa Estimated volume of extravasation: <50 ml Area of extravasation scanned with CT? no PATIENT'S SIGNS AND SYMPTOMS Skin blistering/ulceration: no Decrease capillary refill: no Change in skin color: no Decreased motor function or severe tightness: Unable to assess due to stroke Decreased pulses distal to site of extravasation: no Altered sensation: Unable to assess due to stroke Increasing pain or signs of increased swelling during observation: no TREATMENT Limb elevation: yes Ice packs applied: yes Heat pads applied: yes Plastic surgery consulted? no DOCUMENTATION AND FOLLOW-UP Site contrast extravasation forms submitted? yes Post extravasation orders completed? yes Was additional follow up assigned to PA's? Yes COMPARISON:  MRI brain from the same day at 11:55 a.m. CT head without contrast at 3:08 a.m. FINDINGS: CT HEAD FINDINGS Brain: Hypoattenuation involving the right temporal lobe, insular cortex, and basal ganglia matches the diffusion abnormality. There is effacement of the sulci. Midline shift is 2 mm. There is no hemorrhage. A hyperdense right MCA is noted. No new infarcts are present. Vascular: Hyperdense right MCA. Skull: The calvarium is intact. No focal lytic or blastic lesions are present. Sinuses: Minimal mucosal thickening is present in the left maxillary sinus. The remaining paranasal sinuses and the mastoid air cells are clear. Orbits: The globes and orbits are within normal limits. Review of the MIP images confirms the above findings CTA NECK FINDINGS Aortic arch: There is a common origin of the left common  carotid  artery and the innominate artery. There is no focal stenosis of the great vessel origins at the aorta. No significant vascular calcifications are present at the aorta. Right carotid system: The right common carotid artery is within normal limits. Soft tissue plaque is noted along the posterior proximal right internal carotid artery at the bifurcation. There is a shelf-like plaque. The lumen is narrowed to 3 mm maximally. The more distal right internal carotid artery is within normal limits. Left carotid system: The left common carotid artery is within normal limits. Atherosclerotic changes are noted at the bifurcation. The cervical left internal carotid artery is normal. Vertebral arteries: The vertebral arteries both originate from the subclavian arteries without significant stenoses. Both vertebral arteries are small without significant focal stenosis in the neck. Skeleton: Age advanced degenerative changes are noted at C5-6 and C6-7. Left greater than right uncovertebral spurring is present at C4-5. Vertebral body heights are maintained. No focal lytic or blastic lesions are present. Other neck: 11 mm inferior thyroid nodule is present on the left. No other discrete thyroid lesions are present. There is no significant adenopathy. No focal mucosal lesions are evident. The salivary glands are within normal limits. Upper chest: Ground-glass attenuation of the lung apices bilaterally likely reflects edema or atelectasis. No focal nodule or mass lesion is present. Review of the MIP images confirms the above findings CTA HEAD FINDINGS Anterior circulation: Internal carotid artery's are within normal limits through the ICA termini bilaterally. The A1 segments are patent. Anterior communicating artery is patent. There is a proximal occlusion of the right M1 segment. The left M1 segment is within normal limits. The left MCA bifurcation is unremarkable. Left MCA and right ACA branch vessels are within normal limits.  Anterior and superior right MCA branch vessels perfuse. Posterior and inferior branch vessels are occluded. Posterior circulation: The vertebral arteries are small. PICA origins are visualized and is normal. The basilar artery is small, terminating at the superior cerebellar artery is. Fetal type posterior cerebral arteries are present bilaterally. The PCA branch vessels are within normal limits. Venous sinuses: The dural sinuses are patent. The right transverse sinus is dominant. The straight sinus deep cerebral veins are intact. Cortical veins are within normal limits. Anatomic variants: Bilateral fetal type posterior cerebral artery is. Delayed phase: The infarct is again noted. No pathologic enhancement is evident. Review of the MIP images confirms the above findings IMPRESSION: 1. Proximal right M1 conclusion. 2. Expected evolution of right temporal lobe, insular cortex, and basal ganglia nonhemorrhagic infarction. 3. Focal irregularity at the right carotid bifurcation with a shelf-like soft tissue plaque but no significant stenosis relative to the more distal vessel. 4. More mild atherosclerotic changes at the left carotid bifurcation. 5. Fetal type posterior cerebral artery is bilaterally. 6. 11 mm thyroid nodule. Consider further evaluation with thyroid ultrasound. If patient is clinically hyperthyroid, consider nuclear medicine thyroid uptake and scan. Thyroid ultrasound can be done following discharge. These results were called by telephone at the time of interpretation on 12/03/2016 at 9:00 pm to Dr. Noel Christmas, who verbally acknowledged these results. Electronically Signed   By: Marin Roberts M.D.   On: 12/03/2016 21:15   Mr Brain Wo Contrast  Result Date: 12/03/2016 CLINICAL DATA:  Slurred speech and LEFT-sided weakness beginning earlier today. EXAM: MRI HEAD WITHOUT CONTRAST TECHNIQUE: Multiplanar, multiecho pulse sequences of the brain and surrounding structures were obtained without  intravenous contrast. COMPARISON:  Noncontrast CT head earlier today. FINDINGS: Significant motion degradation. The study  is diagnostic for the clinical indication, but small or subtle lesions could be overlooked. Brain: Large area of restricted diffusion, RIGHT MCA territory, involves the temporal lobe, small portion of the frontal lobe, insula, and basal ganglia, as well as the regional white matter. Findings consistent with acute cerebral infarction. This is not visible on prior CT. Early mass effect RIGHT to LEFT of 1-2 mm. Gradient sequence shows significant motion degradation, pre occluding definitive excess but of hemorrhage. No gross lobar hemorrhage. No mass lesion, hydrocephalus, or extra-axial fluid. Vascular: Suspected RIGHT MCA M1 stenosis or occlusion. This is difficult to confirm due to motion degradation. Skull and upper cervical spine: Normal marrow signal. Sinuses/Orbits: Negative. Other: None. IMPRESSION: Motion degraded exam. Large RIGHT MCA territory nonhemorrhagic infarct as described. There is early mass effect RIGHT-to-LEFT of 1-2 mm. Close follow-up recommended. Within limits for detection on this motion degraded exam, there is no gross hemorrhage, although small areas could be overlooked. Consider CT follow-up. Suspected RIGHT MCA M1 stenosis or occlusion. This could be further assessed with CT angiography of the head and neck, but only after the patient is more cooperative. These results were called by telephone at the time of interpretation on 12/03/2016 at 1:04 pm to Dr. Wilford CornerArora, who verbally acknowledged these results. Electronically Signed   By: Elsie StainJohn T Curnes M.D.   On: 12/03/2016 13:05   Dg Swallowing Func-speech Pathology  Result Date: 12/03/2016 Objective Swallowing Evaluation: Type of Study: MBS-Modified Barium Swallow Study Patient Details Name: Shannon DiceCynthia Risden MRN: [id number] Date of Birth: 08/20/1975 Today's Date: 12/03/2016 Time: SLP Start Time (ACUTE ONLY): 1238-SLP Stop Time  (ACUTE ONLY): 1252 SLP Time Calculation (min) (ACUTE ONLY): 14 min Past Medical History: Past Medical History: Diagnosis Date . Migraine  Past Surgical History: Past Surgical History: Procedure Laterality Date . CESAREAN SECTION Bilateral 1995, 2002 HPI: 41 y.o. female w hx of migraines was at home around 11pm c/o headache and then had altered mental status; BSE completed and pt kept NPO d/t overt s/s of aspiration including delayed throat clearing/cough with thin liquids; anterior left loss; CT negative; MRI pending No Data Recorded Assessment / Plan / Recommendation CHL IP CLINICAL IMPRESSIONS 12/03/2016 Clinical Impression Pt presents with mild-moderate oropharyngeal dysphagia characterized by premature spillage to pyriform sinuses prior to swallow triggering d/t sensory impairment; anterior loss noted with liquids either via tsp and/or cup; chin tuck and head turn left attempted with larger volumes of thin and nectar-thickened liquids, but was inconsistent d/t pt lethargy, subsequently, larger volumes of thin entered laryngeal vestibule with cup sips and to level of the vocal folds with straw sips and ejected out; no penetration and/or aspiration noted with nectar-thickened liquids via cup/straw, but pt is at risk with larger amounts; mechanical/soft consistency attempted, but removed from oral cavity d/t impaired mastication/poor lingual control/manipulation paired with pt lethargy; recommend Dysphagia 1 (puree) with nectar-thickened liquids via individual straw sips to reduce anterior loss presented on right side pending pt alertness level and increased appetite with swallowing precautions in place during PO intake SLP Visit Diagnosis Dysphagia, oropharyngeal phase (R13.12) Attention and concentration deficit following -- Frontal lobe and executive function deficit following -- Impact on safety and function Moderate aspiration risk   CHL IP TREATMENT RECOMMENDATION 12/03/2016 Treatment Recommendations Therapy as  outlined in treatment plan below   Prognosis 12/03/2016 Prognosis for Safe Diet Advancement Good Barriers to Reach Goals -- Barriers/Prognosis Comment -- CHL IP DIET RECOMMENDATION 12/03/2016 SLP Diet Recommendations Dysphagia 1 (Puree) solids;Nectar thick liquid Liquid Administration via Cup;Straw;Other (  Comment) Medication Administration Crushed with puree Compensations Slow rate;Small sips/bites;Lingual sweep for clearance of pocketing;Monitor for anterior loss Postural Changes --   CHL IP OTHER RECOMMENDATIONS 12/03/2016 Recommended Consults -- Oral Care Recommendations Oral care BID Other Recommendations Order thickener from pharmacy   CHL IP FOLLOW UP RECOMMENDATIONS 12/03/2016 Follow up Recommendations Other (comment)   CHL IP FREQUENCY AND DURATION 12/03/2016 Speech Therapy Frequency (ACUTE ONLY) min 2x/week Treatment Duration 1 week      CHL IP ORAL PHASE 12/03/2016 Oral Phase Impaired Oral - Pudding Teaspoon -- Oral - Pudding Cup -- Oral - Honey Teaspoon -- Oral - Honey Cup -- Oral - Nectar Teaspoon Left anterior bolus loss;Premature spillage Oral - Nectar Cup -- Oral - Nectar Straw Premature spillage Oral - Thin Teaspoon Left anterior bolus loss;Premature spillage Oral - Thin Cup Left anterior bolus loss;Premature spillage Oral - Thin Straw Premature spillage Oral - Puree Premature spillage Oral - Mech Soft -- Oral - Regular -- Oral - Multi-Consistency -- Oral - Pill -- Oral Phase - Comment (No Data)  CHL IP PHARYNGEAL PHASE 12/03/2016 Pharyngeal Phase Impaired Pharyngeal- Pudding Teaspoon -- Pharyngeal -- Pharyngeal- Pudding Cup -- Pharyngeal -- Pharyngeal- Honey Teaspoon -- Pharyngeal -- Pharyngeal- Honey Cup -- Pharyngeal -- Pharyngeal- Nectar Teaspoon Delayed swallow initiation-pyriform sinuses Pharyngeal Material does not enter airway Pharyngeal- Nectar Cup Delayed swallow initiation-pyriform sinuses Pharyngeal Material does not enter airway Pharyngeal- Nectar Straw Delayed swallow initiation-pyriform  sinuses Pharyngeal Material does not enter airway Pharyngeal- Thin Teaspoon Delayed swallow initiation-pyriform sinuses Pharyngeal Material enters airway, remains ABOVE vocal cords then ejected out Pharyngeal- Thin Cup Delayed swallow initiation-pyriform sinuses;Compensatory strategies attempted (chin tuck; head turn left) Pharyngeal Material enters airway, remains ABOVE vocal cords then ejected out Pharyngeal- Thin Straw Delayed swallow initiation-pyriform sinuses;Penetration/Aspiration before swallow;Trace aspiration;Compensatory strategies attempted (chin tuck/head turn left) Pharyngeal Material enters airway, CONTACTS cords and then ejected out Pharyngeal- Puree Delayed swallow initiation-vallecula Pharyngeal -- Pharyngeal- Mechanical Soft NT Pharyngeal -- Pharyngeal- Regular -- Pharyngeal -- Pharyngeal- Multi-consistency -- Pharyngeal -- Pharyngeal- Pill -- Pharyngeal -- Pharyngeal Comment (No Data)  CHL IP CERVICAL ESOPHAGEAL PHASE 12/03/2016 Cervical Esophageal Phase WFL Pudding Teaspoon -- Pudding Cup -- Honey Teaspoon -- Honey Cup -- Nectar Teaspoon -- Nectar Cup -- Nectar Straw -- Thin Teaspoon -- Thin Cup -- Thin Straw -- Puree -- Mechanical Soft -- Regular -- Multi-consistency -- Pill -- Cervical Esophageal Comment -- CHL IP GO 12/03/2016 Functional Assessment Tool Used NOMS; clinical judgment Functional Limitations Swallowing Swallow Current Status (Z6109) CK Swallow Goal Status (U0454) CJ Swallow Discharge Status (U9811) (None) Motor Speech Current Status (B1478) (None) Motor Speech Goal Status (G9562) (None) Motor Speech Goal Status (Z3086) (None) Spoken Language Comprehension Current Status (V7846) (None) Spoken Language Comprehension Goal Status (N6295) (None) Spoken Language Comprehension Discharge Status (M8413) (None) Spoken Language Expression Current Status (K4401) (None) Spoken Language Expression Goal Status (U2725) (None) Spoken Language Expression Discharge Status [phone]) (None) Attention  Current Status (I3474) (None) Attention Goal Status (Q5956) (None) Attention Discharge Status (L8756) (None) Memory Current Status (E3329) (None) Memory Goal Status (J1884) (None) Memory Discharge Status (Z6606) (None) Voice Current Status (T0160) (None) Voice Goal Status (F0932) (None) Voice Discharge Status (T5573) (None) Other Speech-Language Pathology Functional Limitation Current Status (U2025) (None) Other Speech-Language Pathology Functional Limitation Goal Status (K2706) (None) Other Speech-Language Pathology Functional Limitation Discharge Status [phone]) (None) Tressie Stalker, M.S., CCC-SLP 12/03/2016, 1:18 PM               Assessment/Plan: Diagnosis: Right MCA territory nonhemorrhagic infarct  Labs and images independently reviewed.  Records reviewed and summated above. Stroke: Continue secondary stroke prophylaxis and Risk Factor Modification listed below:   Antiplatelet therapy:   Blood Pressure Management:  Continue current medication with prn's with permisive HTN per primary team Statin Agent:   Preiabetes management:   Left sided hemiparesis: fit for orthosis to prevent contractures (resting hand splint for day, wrist cock up splint at night, PRAFO, etc) Motor recovery: Fluoxetine  1. Does the need for close, 24 hr/day medical supervision in concert with the patient's rehab needs make it unreasonable for this patient to be served in a less intensive setting? Likely  2. Co-Morbidities requiring supervision/potential complications: migraine (Biofeedback training with therapies to help reduce reliance on opiate pain medications, monitor pain control during therapies, and sedation at rest and titrate to maximum efficacy to ensure participation and gains in therapies), pain (Biofeedback training with therapies to help reduce reliance on opiate and IV pain medications, particularly IV toradol, monitor pain control during therapies, and sedation at rest and titrate to maximum efficacy to ensure  participation and gains in therapies), prediabetes (Monitor in accordance with exercise and adjust meds as necessary), dysphagia (advance diet as tolerated) 3. Due to bladder management, safety, disease management, medication administration and patient education, does the patient require 24 hr/day rehab nursing? Yes 4. Does the patient require coordinated care of a physician, rehab nurse, PT (1-2 hrs/day, 5 days/week), OT (1-2 hrs/day, 5 days/week) and SLP (1-2 hrs/day, 5 days/week) to address physical and functional deficits in the context of the above medical diagnosis(es)? Yes Addressing deficits in the following areas: balance, endurance, locomotion, strength, transferring, bowel/bladder control, bathing, dressing, toileting, cognition, speech, swallowing and psychosocial support 5. Can the patient actively participate in an intensive therapy program of at least 3 hrs of therapy per day at least 5 days per week? Potentially 6. The potential for patient to make measurable gains while on inpatient rehab is excellent 7. Anticipated functional outcomes upon discharge from inpatient rehab are TBD  with PT, TBD with OT, TBD with SLP. 8. Estimated rehab length of stay to reach the above functional goals is: TBD 9. Anticipated D/C setting: Home 10. Anticipated post D/C treatments: HH therapy and Home excercise program 11. Overall Rehab/Functional Prognosis: good  RECOMMENDATIONS: This patient's condition is appropriate for continued rehabilitative care in the following setting: Likely CIR.  Will await completion of medical workup and eval by therapies.  Patient has agreed to participate in recommended program. Potentially Note that insurance prior authorization may be required for reimbursement for recommended care.  Comment: Rehab Admissions Coordinator to follow up.  Maryla Morrow, MD, [place] [facility] Amor., PA-C 12/05/2016

## 2016-12-05 NOTE — Progress Notes (Signed)
[facility]  CONSULT SERVICE NOTE   Pt down in ECHO/EEG/Vascular lab the greater part of the morning and at present. Therapy evals still pending. Will complete rehab consult later today or first thing in AM  Ranelle OysterZachary T. Swartz, MD, [facility] 12/05/2016'

## 2016-12-05 NOTE — Interval H&P Note (Signed)
History and Physical Interval Note:  12/05/2016 12:30 PM  Shannon DiceCynthia Coccia  has presented today for surgery, with the diagnosis of stroke  The various methods of treatment have been discussed with the patient and family. After consideration of risks, benefits and other options for treatment, the patient has consented to  Procedure(s): TRANSESOPHAGEAL ECHOCARDIOGRAM (TEE) (N/A) as a surgical intervention .  The patient's history has been reviewed, patient examined, no change in status, stable for surgery.  I have reviewed the patient's chart and labs.  Questions were answered to the patient's satisfaction.     Kristeen MissPhilip Nahser

## 2016-12-05 NOTE — Progress Notes (Signed)
Routine EEG completed, results pending. 

## 2016-12-05 NOTE — Progress Notes (Addendum)
STROKE TEAM PROGRESS NOTE   SUBJECTIVE (INTERVAL HISTORY) Her father is at the bedside.  She just back from TEE and it was unremarkable. TCD pending. Her CTA neck showed left ICA Bifurcation possible plaque like structure.    OBJECTIVE Temp:  [98 F (36.7 C)-98.6 F (37 C)] 98.6 F (37 C) (07/16 1340) Pulse Rate:  [63-98] 70 (07/16 1340) Cardiac Rhythm: Normal sinus rhythm (07/16 0700) Resp:  [12-18] 16 (07/16 1340) BP: (87-127)/(40-70) 107/64 (07/16 1340) SpO2:  [96 %-100 %] 99 % (07/16 1340) Weight:  [89.4 kg (197 lb 1.5 oz)] 89.4 kg (197 lb 1.5 oz) (07/16 0500)  CBC:   Recent Labs Lab 12/03/16 0254 12/03/16 0304 12/04/16 0419  WBC 7.4  --  12.5*  NEUTROABS 3.4  --   --   HGB 13.3 13.3 13.5  HCT 40.5 39.0 41.9  MCV 84.7  --  85.2  PLT 259  --  307    Basic Metabolic Panel:   Recent Labs Lab 12/03/16 0254 12/03/16 0304 12/04/16 0419  NA 138 140 138  K 3.5 3.4* 3.8  CL 105 103 112*  CO2 24  --  20*  GLUCOSE 138* 135* 198*  BUN 12 12 8   CREATININE 0.86 0.90 0.84  CALCIUM 8.5*  --  8.4*    Lipid Panel:     Component Value Date/Time   CHOL 193 12/04/2016 0419   TRIG 70 12/04/2016 0419   HDL 41 12/04/2016 0419   CHOLHDL 4.7 12/04/2016 0419   VLDL 14 12/04/2016 0419   LDLCALC 138 (H) 12/04/2016 0419   HgbA1c: No results found for: HGBA1C Urine Drug Screen: No results found for: LABOPIA, COCAINSCRNUR, LABBENZ, AMPHETMU, THCU, LABBARB  Alcohol Level No results found for: [facility]  IMAGING I have personally reviewed the radiological images below and agree with the radiology interpretations.  Ct Angio Head W Or Wo Contrast Ct Angio Neck W Or Wo Contrast 12/03/2016 1. Proximal right M1 conclusion.  2. Expected evolution of right temporal lobe, insular cortex, and basal ganglia nonhemorrhagic infarction.  3. Focal irregularity at the right carotid bifurcation with a shelf-like soft tissue plaque but no significant stenosis relative to the more distal vessel.   4. More mild atherosclerotic changes at the left carotid bifurcation.  5. Fetal type posterior cerebral artery is bilaterally.  6. 11 mm thyroid nodule. Consider further evaluation with thyroid ultrasound. If patient is clinically hyperthyroid, consider nuclear medicine thyroid uptake and scan. Thyroid ultrasound can be done following discharge.   Mr Brain Wo Contrast 12/03/2016 Motion degraded exam.  Large RIGHT MCA territory nonhemorrhagic infarct as described.  There is early mass effect RIGHT-to-LEFT of 1-2 mm. Close follow-up recommended.  Within limits for detection on this motion degraded exam, there is no gross hemorrhage, although small areas could be overlooked. Consider CT follow-up. Suspected RIGHT MCA M1 stenosis or occlusion. This could be further assessed with CT angiography of the head and neck, but only after the patient is more cooperative.      Ct Head Code Stroke W/o Cm 12/03/2016 1. Negative mildly motion degraded noncontrast CT HEAD.  2. ASPECTS is 10.  LE venous Doppler pending 12/05/2016 Mild technical difficulty due to size of the veins.   - No evidence of deep vein or superficial thrombosis involving the right lower extremity and left lower extremity  TTE pending Left ventricle: The cavity size was normal. Systolic function was normal. The estimated ejection fraction was in the range of 60% to 65%. Wall motion  was normal; there were no regional wall motion abnormalities. Left ventricular diastolic function parameters were normal. - Mitral valve: There was trivial regurgitation. - Pulmonic valve: There was no regurgitation.  TCD bubble study pending  TCD emboli detection pending  CT head repeat pending  TEE Left Ventrical:  Low normal LV function.  EF 50% Mitral Valve: normal  Aortic Valve: normal  Tricuspid Valve: normal  Pulmonic Valve: normal  Left Atrium/ Left atrial appendage: no thrombi  Atrial septum: no PFO or ASD by color flow or Doppler   Aorta: normal   EEG  1.  Focal structural or other physiologic abnormality in the right hemisphere,  Consistent with location of stroke. 2.  diffuse cerebral dysfunction that is non-specific in etiology and can be seen with hypoxic/ischemic injury, toxic/metabolic encephalopathies, neurodegenerative disorders, or medication effect.     PHYSICAL EXAM  Temp:  [98 F (36.7 C)-98.6 F (37 C)] 98.6 F (37 C) (07/16 1340) Pulse Rate:  [63-98] 70 (07/16 1340) Resp:  [12-18] 16 (07/16 1340) BP: (87-127)/(40-70) 107/64 (07/16 1340) SpO2:  [96 %-100 %] 99 % (07/16 1340) Weight:  [89.4 kg (197 lb 1.5 oz)] 89.4 kg (197 lb 1.5 oz) (07/16 0500)  General - Well nourished, well developed, lethargic.  Ophthalmologic - Fundi not visualized due to noncooperation.  Cardiovascular - Regular rate and rhythm.  Mental Status -  Level of arousal and orientation to time, place, and person were intact. Language including expression, naming, repetition, comprehension was assessed and found intact. Fund of Knowledge was assessed and was intact.  Cranial Nerves II - XII - II - Visual field intact OU. III, IV, VI - Extraocular movements intact. V - Facial sensation intact bilaterally. VII - left lower facial weakness. VIII - Hearing & vestibular intact bilaterally. X - Palate elevates symmetrically, mild dysarthria. XI - Chin turning & shoulder shrug intact bilaterally. XII - Tongue protrusion to the left.  Motor Strength - The patient's strength was normal in RUE and RLE, however LUE 0/5, LLE proximal 2/5 and knee extension 3/5.  Bulk was normal and fasciculations were absent.   Motor Tone - Muscle tone was assessed at the neck and appendages and was normal.  Reflexes - The patient's reflexes were 1+ in all extremities and she had no pathological reflexes.  Sensory - Light touch, temperature/pinprick were assessed and were symmetrical.    Coordination - The patient had normal movements in the  right hand with no ataxia or dysmetria.  Tremor was absent.  Gait and Station - not tested.   ASSESSMENT/PLAN [name] is a 41 y.o. female with history of migraine headaches presenting with left-sided weakness. 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  Resultant  left facial droop and left hemiparesis   CT head - negative  MRI head - Large RIGHT MCA territory infarct with  MRA head - Suspected RIGHT MCA M1 stenosis or occlusion.   CTA H&N - Proximal right M1 conclusion.   TCD emboli detection - pending   TCDs with bubbles - pending  2D Echo: EF 60-65%. No source of embolus  TEE unremarkable  LE venous duplex - no DVT  LDL - 138  HgbA1c - pending  Hypercoagulable workup pending  VTE prophylaxis - Lovenox Diet Heart Room service appropriate? Yes; Fluid consistency: Thin  No antithrombotic prior to admission, now on aspirin 325 mg daily.   Patient counseled  to be compliant with her antithrombotic medications  Ongoing aggressive stroke risk factor management  Therapy recommendations: pending  Disposition: Pending  Right ICA bifurcation plaque like structure  Not clear etiology  Could be the source of the stroke  Will need to review with radiology  Hypertension  Stable  Permissive hypertension (OK if < 220/120) but gradually normalize in 5-7 days  Long-term BP goal normotensive  Hyperlipidemia  Home meds: No lipid lowering medications prior to admission  LDL 138, goal < 70  Now on Lipitor 40 mg daily  Continue statin at discharge  Other Stroke Risk Factors  Obesity, Body mass index is 34.91 kg/m., recommend weight loss, diet and exercise as appropriate   Migraines - chronic, 2 per months, lasting all day, using Excedrin for abortive therapy - now on topamax   Other Active Problems  Mild leukocytosis  Thyroid nodule - follow-up  recommended  History of meningitis 17 years ago without residual deficit  Patient works as Development worker, [employer]  Marvel Plan, MD PhD Stroke Neurology 12/05/2016 2:53 PM    To contact Stroke Continuity provider, please refer to [email or web address]. After hours, contact General Neurology

## 2016-12-05 NOTE — CV Procedure (Signed)
    Transesophageal Echocardiogram Note  Shannon DiceCynthia Erickson [id number] 10/21/1975  Procedure: Transesophageal Echocardiogram Indications: CVA   Procedure Details Consent: Obtained Time Out: Verified patient identification, verified procedure, site/side was marked, verified correct patient position, special equipment/implants available, Radiology Safety Procedures followed,  medications/allergies/relevent history reviewed, required imaging and test results available.  Performed  Medications:  During this procedure the patient is administered a total of Versed 2 mg and Fentanyl 25 mcg  to achieve and maintain moderate conscious sedation.  The patient's heart rate, blood pressure, and oxygen saturation are monitored continuously during the procedure. The period of conscious sedation is 30  minutes, of which I was present face-to-face 100% of this time.  Left Ventrical:  Low normal LV function.  EF 50%  Mitral Valve: normal   Aortic Valve: normal   Tricuspid Valve: normal   Pulmonic Valve: normal   Left Atrium/ Left atrial appendage: no thrombi   Atrial septum: no PFO or ASD by color flow or Doppler   Aorta: normal    Complications: No apparent complications Patient did tolerate procedure well.   Shannon MixerPhilip J. Nahser, [name], MD, [facility] LtdFACC 12/05/2016, 12:47 PM

## 2016-12-05 NOTE — Progress Notes (Signed)
Pt returned from procedure at 13:39 alert, verbal with no noted distress. Pt denies pain or discomfort. Family at bedside. Call bell within reach. Will continue to monitor.

## 2016-12-06 ENCOUNTER — Inpatient Hospital Stay (HOSPITAL_COMMUNITY): Payer: Medicaid Other

## 2016-12-06 ENCOUNTER — Encounter (HOSPITAL_COMMUNITY): Payer: Self-pay | Admitting: Cardiovascular Disease

## 2016-12-06 DIAGNOSIS — I63411 Cerebral infarction due to embolism of right middle cerebral artery: Principal | ICD-10-CM

## 2016-12-06 DIAGNOSIS — E041 Nontoxic single thyroid nodule: Secondary | ICD-10-CM

## 2016-12-06 DIAGNOSIS — R7303 Prediabetes: Secondary | ICD-10-CM

## 2016-12-06 DIAGNOSIS — G43709 Chronic migraine without aura, not intractable, without status migrainosus: Secondary | ICD-10-CM

## 2016-12-06 DIAGNOSIS — I69391 Dysphagia following cerebral infarction: Secondary | ICD-10-CM

## 2016-12-06 DIAGNOSIS — I6523 Occlusion and stenosis of bilateral carotid arteries: Secondary | ICD-10-CM

## 2016-12-06 DIAGNOSIS — R52 Pain, unspecified: Secondary | ICD-10-CM

## 2016-12-06 LAB — HEMOGLOBIN A1C
HEMOGLOBIN A1C: 6 % — AB (ref 4.8–5.6)
HEMOGLOBIN A1C: 6.2 % — AB (ref 4.8–5.6)
MEAN PLASMA GLUCOSE: 126 mg/dL
MEAN PLASMA GLUCOSE: 131 mg/dL

## 2016-12-06 LAB — BASIC METABOLIC PANEL
Anion gap: 6 (ref 5–15)
BUN: 12 mg/dL (ref 6–20)
CALCIUM: 8.2 mg/dL — AB (ref 8.9–10.3)
CHLORIDE: 110 mmol/L (ref 101–111)
CO2: 21 mmol/L — AB (ref 22–32)
CREATININE: 0.81 mg/dL (ref 0.44–1.00)
GFR calc Af Amer: >60 mL/min (ref >60)
GFR calc non Af Amer: >60 mL/min (ref >60)
GLUCOSE: 108 mg/dL — AB (ref 65–99)
Potassium: 3.8 mmol/L (ref 3.5–5.1)
Sodium: 137 mmol/L (ref 135–145)

## 2016-12-06 LAB — PROTEIN C, TOTAL: PROTEIN C, TOTAL: 162 % — AB (ref 60–150)

## 2016-12-06 LAB — HOMOCYSTEINE: HOMOCYSTEINE-NORM: 10.3 umol/L (ref 0.0–15.0)

## 2016-12-06 MED ORDER — CLOPIDOGREL BISULFATE 75 MG PO TABS
75.0000 mg | ORAL_TABLET | Freq: Every day | ORAL | Status: DC
Start: 2016-12-06 — End: 2016-12-10
  Administered 2016-12-06 – 2016-12-10 (×5): 75 mg via ORAL
  Filled 2016-12-06 (×5): qty 1

## 2016-12-06 MED ORDER — RESOURCE THICKENUP CLEAR PO POWD
ORAL | Status: DC | PRN
  Filled 2016-12-06: qty 125

## 2016-12-06 MED ORDER — ATORVASTATIN CALCIUM 80 MG PO TABS
80.0000 mg | ORAL_TABLET | Freq: Every day | ORAL | Status: DC
  Administered 2016-12-06 – 2016-12-09 (×4): 80 mg via ORAL
  Filled 2016-12-06 (×3): qty 1

## 2016-12-06 NOTE — Progress Notes (Signed)
Pt off floor to CT

## 2016-12-06 NOTE — Care Management (Signed)
12/06/16 Noted referral to CM for assistance with POA per family request.. This CM provided form for Advanced Directive/Health Care POA and explained the process to the pt father as the pt was sleeping and not easily awakened.  Pt father states that he will provide info for pt children to review with the patient tomorrow, Wed, 12/07/2016 as the pt [place] and the pt son should be available .  Request entered for followup by chaplain services for completion of health care POA.

## 2016-12-06 NOTE — Progress Notes (Signed)
Transcranial Doppler with monitoring completed. 15 minutes of monitoring complete. No HITS detected.  Transcranial Doppler bubble study completed. Dr. Roda ShuttersXu performed. Left forearm IV used. Left MCA insonated. 0 HITS heard at rest.  Patient unable to cooperate for Valsalva. No apparent PFO at rest.   Shannon Erickson, RDMS, RVT [facility], RVS 12/06/2016

## 2016-12-06 NOTE — Progress Notes (Signed)
PROGRESS NOTE    Shannon Erickson  WUJ:[id number] DOB: 1976/02/22 DOA: 12/03/2016 PCP: Patient, No Pcp Per    Brief Narrative:  CynthiaSmallwoodis a 41 y.o.female,w hx of migraines was at home around 11pm c/o headache and then had altered mental status Headache frontal and occipital. Very severe. Pt present to ED and had further complaints of left sided jerking and weakness. MRI of brain revealed multiple embolic strokes. Concern for Endocarditis so aspirin on hold.   Assessment & Plan:   Principal Problem:   Stroke (cerebrum) (HCC) Active Problems:   Headache   Hypokalemia   Left-sided weakness   Hyperlipidemia   Chronic migraine without aura without status migrainosus, not intractable   Thyroid nodule   Pain   Prediabetes   Dysphagia, post-stroke   #1 acute right large MCA territory infarct Concern for embolic stroke from an unknown source. Patient with a left facial droop and left hemiparesis. CT head negative. MRI head with large right MCA territory infarct. MRA head with suspected right MCA M1 stenosis or occlusion. CT angiogram head and neck with proximal right M1 occlusion. 2-D echo with EF of 60-65% with no source of emboli. TEE done was unremarkable negative for PFO or ASO or endocarditis. Lower extremity Dopplers negative for DVT. Hypercoagulable panel pending. Fasting lipid panel with LDL of 138. Repeat head CT with evolving acute large right MCA territory infarct without hemorrhagic conversion. Increasing 5 mm right-to-left midline shift without ventricular entrapment. Dense right MCA consistent with thromboembolism. As endocarditis has been ruled out to be placed now on aspirin 325 mg daily for secondary stroke prophylaxis. Continue statin. Continue risk factor management. Permissive hypertension. Patient's blood pressure borderline to low and as such will continue IV fluids.  PT/OT/SLP. Neurology following and appreciate input and recommendations.  #2 right ICA  bifurcation plaque-like structure Concern as to whether this is the source of patient's stroke. Per neurology.  #3 hypertension Patient's blood pressure is low to borderline. Permissive hypertension. Continue IV fluids as blood pressure is currently soft.  #4 hyperlipidemia Fasting lipid panel with LDL of 138. Goal LDL less than 70. Continue Lipitor.  #5 thyroid nodule TSH within normal limits. Will likely need thyroid ultrasound on follow-up to be done in the outpatient setting.  #6 headache Likely secondary to problem #1. Continue Topamax.  #7 hypokalemia Repleted.  #8 left-sided weakness  likely secondary to problem #1. PT/OT.   DVT prophylaxis: Lovenox Code Status: Full Family Communication: Updated patient and family at bedside. Disposition Plan: CIR versus SNF once stroke workup is completed.   Consultants:   Neurology: Dr.Eshraghi 12/03/2016  Procedures:   2-D echo 12/05/2016  Transesophageal echo 12/05/2016  CT head 12/03/2016, 12/06/2016  MRI head 12/03/2016  CT angiogram head and neck 12/03/2016  Lower extremity Dopplers 12/05/2016  EEG 12/05/2016  TCD bubble study pending  Antimicrobials:   None   Subjective: Patient laying in bed easily arousable. No significant change in left upper extremity weakness.  Objective: Vitals:   12/06/16 0037 12/06/16 0500 12/06/16 0835 12/06/16 1339  BP: 118/61 (!) 108/58 109/65 122/79  Pulse: 66 (!) 59 72 (!) 59  Resp: 18 20 18 18   Temp: 98 F (36.7 C) 98 F (36.7 C) 97.9 F (36.6 C) 97.7 F (36.5 C)  TempSrc: Oral Oral Oral Oral  SpO2: 100% 100% 98% 99%  Weight:      Height:        Intake/Output Summary (Last 24 hours) at 12/06/16 1654 Last data filed at 12/06/16 0600  Gross per 24 hour  Intake             1504 ml  Output              800 ml  Net              704 ml   Filed Weights   12/03/16 0602 12/04/16 0525 12/05/16 0500  Weight: 86.1 kg (189 lb 12.8 oz) 88.3 kg (194 lb 10.7 oz) 89.4 kg  (197 lb 1.5 oz)    Examination:  General exam: Sleeping. Easily arousable. Respiratory system: Clear to auscultation. Respiratory effort normal. Cardiovascular system: S1 & S2 heard, RRR. No JVD, murmurs, rubs, gallops or clicks. No pedal edema. Gastrointestinal system: Abdomen is nondistended, soft and nontender. No organomegaly or masses felt. Normal bowel sounds heard. Central nervous system: Sleepy however easily arousable. Left-sided weakness. Extremities: Left-sided weakness in upper extremity. Skin: No rashes, lesions or ulcers Psychiatry: Judgement and insight appear fair. Mood & affect appropriate.     Data Reviewed: I have personally reviewed following labs and imaging studies  CBC:  Recent Labs Lab 12/03/16 0254 12/03/16 0304 12/04/16 0419 12/05/16 1516  WBC 7.4  --  12.5* 8.9  NEUTROABS 3.4  --   --  5.9  HGB 13.3 13.3 13.5 13.5  HCT 40.5 39.0 41.9 42.1  MCV 84.7  --  85.2 85.9  PLT 259  --  307 251   Basic Metabolic Panel:  Recent Labs Lab 12/03/16 0254 12/03/16 0304 12/04/16 0419 12/05/16 1516 12/06/16 0903  NA 138 140 138 137 137  K 3.5 3.4* 3.8 3.6 3.8  CL 105 103 112* 107 110  CO2 24  --  20* 22 21*  GLUCOSE 138* 135* 198* 88 108*  BUN 12 12 8 17 12   CREATININE 0.86 0.90 0.84 0.90 0.81  CALCIUM 8.5*  --  8.4* 8.1* 8.2*  MG  --   --   --  1.8  --    GFR: Estimated Creatinine Clearance: 97.9 mL/min (by C-G formula based on SCr of 0.81 mg/dL). Liver Function Tests:  Recent Labs Lab 12/03/16 0254 12/04/16 0419  AST 34 25  ALT 54 47  ALKPHOS 92 95  BILITOT 0.8 0.7  PROT 6.3* 6.4*  ALBUMIN 2.8* 2.7*   No results for input(s): LIPASE, AMYLASE in the last 168 hours. No results for input(s): AMMONIA in the last 168 hours. Coagulation Profile:  Recent Labs Lab 12/03/16 0254  INR 0.94   Cardiac Enzymes: No results for input(s): CKTOTAL, CKMB, CKMBINDEX, TROPONINI in the last 168 hours. BNP (last 3 results) No results for input(s):  PROBNP in the last 8760 hours. HbA1C:  Recent Labs  12/04/16 1316  HGBA1C 6.2*   CBG:  Recent Labs Lab 12/03/16 0257  GLUCAP 134*   Lipid Profile:  Recent Labs  12/04/16 0419  CHOL 193  HDL 41  LDLCALC 138*  TRIG 70  CHOLHDL 4.7   Thyroid Function Tests: No results for input(s): TSH, T4TOTAL, FREET4, T3FREE, THYROIDAB in the last 72 hours. Anemia Panel: No results for input(s): VITAMINB12, FOLATE, FERRITIN, TIBC, IRON, RETICCTPCT in the last 72 hours. Sepsis Labs: No results for input(s): PROCALCITON, LATICACIDVEN in the last 168 hours.  No results found for this or any previous visit (from the past 240 hour(s)).       Radiology Studies: Ct Head Wo Contrast  Result Date: 12/06/2016 CLINICAL DATA:  Stroke.  History of migraine. EXAM: CT HEAD WITHOUT CONTRAST TECHNIQUE: Contiguous axial  images were obtained from the base of the skull through the vertex without intravenous contrast. COMPARISON:  CT HEAD December 03, 2016 FINDINGS: BRAIN: Evolving acute to early subacute RIGHT temporal, frontal, parietal and basal ganglia infarct. Partially effaced RIGHT lateral ventricle without entrapment. 5 mm RIGHT to LEFT midline shift, increased from 3 mm. No intraparenchymal hemorrhage. Basal cisterns are patent. No abnormal extra-axial fluid collections. VASCULAR: Dense RIGHT middle cerebral artery. SKULL/SOFT TISSUES: No skull fracture. No significant soft tissue swelling. ORBITS/SINUSES: The included ocular globes and orbital contents are normal.Trace paranasal sinus mucosal thickening. Dehiscent RIGHT jugular bulb. Under pneumatized bilateral mastoid air cells with trace effusions, no air cell coalescence. OTHER: None. IMPRESSION: 1. Evolving acute large RIGHT MCA territory infarct without hemorrhagic conversion. 2. Increasing 5 mm RIGHT to LEFT midline shift without ventricular entrapment. 3. Dense RIGHT MCA consistent with thromboembolism. Electronically Signed   By: Awilda Metroourtnay  Bloomer  M.D.   On: 12/06/2016 06:31        Scheduled Meds: . aspirin  325 mg Oral Daily  . atorvastatin  40 mg Oral q1800  . clopidogrel  75 mg Oral Daily  . cyanocobalamin  1,000 mcg Intramuscular Daily  . enoxaparin (LOVENOX) injection  40 mg Subcutaneous Q24H  . sodium chloride flush  3 mL Intravenous Q12H  . topiramate  50 mg Oral Daily   Continuous Infusions: . sodium chloride 125 mL/hr at 12/06/16 0600     LOS: 3 days    Time spent: 35 minutes    Sallie Maker, MD [facility] Pager [phone]  If 7PM-7AM, please contact night-coverage [email or web address] Password [email or web address] 12/06/2016, 4:54 PM

## 2016-12-06 NOTE — Progress Notes (Signed)
STROKE TEAM PROGRESS NOTE   SUBJECTIVE (INTERVAL HISTORY) Her father and daughter in law are at the bedside.  She just back from TCD which showed no PFO and no MES. Reviewed CTA neck with Dr. Chestine Sporelark from radiology and concerning right ICA soft plaque as well left ICA mild soft plaque.     OBJECTIVE Temp:  [97.7 F (36.5 C)-98 F (36.7 C)] 97.7 F (36.5 C) (07/17 1339) Pulse Rate:  [59-72] 59 (07/17 1339) Cardiac Rhythm: Sinus bradycardia (07/17 0701) Resp:  [18-20] 18 (07/17 1339) BP: (108-122)/(58-79) 122/79 (07/17 1339) SpO2:  [98 %-100 %] 99 % (07/17 1339)  CBC:   Recent Labs Lab 12/03/16 0254  12/04/16 0419 12/05/16 1516  WBC 7.4  --  12.5* 8.9  NEUTROABS 3.4  --   --  5.9  HGB 13.3  < > 13.5 13.5  HCT 40.5  < > 41.9 42.1  MCV 84.7  --  85.2 85.9  PLT 259  --  307 251  < > = values in this interval not displayed.  Basic Metabolic Panel:   Recent Labs Lab 12/05/16 1516 12/06/16 0903  NA 137 137  K 3.6 3.8  CL 107 110  CO2 22 21*  GLUCOSE 88 108*  BUN 17 12  CREATININE 0.90 0.81  CALCIUM 8.1* 8.2*  MG 1.8  --     Lipid Panel:     Component Value Date/Time   CHOL 193 12/04/2016 0419   TRIG 70 12/04/2016 0419   HDL 41 12/04/2016 0419   CHOLHDL 4.7 12/04/2016 0419   VLDL 14 12/04/2016 0419   LDLCALC 138 (H) 12/04/2016 0419   HgbA1c:  Lab Results  Component Value Date   HGBA1C 6.2 (H) 12/04/2016   Urine Drug Screen: No results found for: LABOPIA, COCAINSCRNUR, LABBENZ, AMPHETMU, THCU, LABBARB  Alcohol Level No results found for: ETH  IMAGING I have personally reviewed the radiological images below and agree with the radiology interpretations.  Ct Angio Head W Or Wo Contrast Ct Angio Neck W Or Wo Contrast 12/03/2016 1. Proximal right M1 conclusion.  2. Expected evolution of right temporal lobe, insular cortex, and basal ganglia nonhemorrhagic infarction.  3. Focal irregularity at the right carotid bifurcation with a shelf-like soft tissue plaque  but no significant stenosis relative to the more distal vessel.  4. More mild atherosclerotic changes at the left carotid bifurcation.  5. Fetal type posterior cerebral artery is bilaterally.  6. 11 mm thyroid nodule. Consider further evaluation with thyroid ultrasound. If patient is clinically hyperthyroid, consider nuclear medicine thyroid uptake and scan. Thyroid ultrasound can be done following discharge.   Mr Brain Wo Contrast 12/03/2016 Motion degraded exam.  Large RIGHT MCA territory nonhemorrhagic infarct as described.  There is early mass effect RIGHT-to-LEFT of 1-2 mm. Close follow-up recommended.  Within limits for detection on this motion degraded exam, there is no gross hemorrhage, although small areas could be overlooked. Consider CT follow-up. Suspected RIGHT MCA M1 stenosis or occlusion. This could be further assessed with CT angiography of the head and neck, but only after the patient is more cooperative.      Ct Head Code Stroke W/o Cm 12/03/2016 1. Negative mildly motion degraded noncontrast CT HEAD.  2. ASPECTS is 10.  LE venous Doppler pending 12/05/2016 Mild technical difficulty due to size of the veins.   - No evidence of deep vein or superficial thrombosis involving the right lower extremity and left lower extremity  TTE pending Left ventricle: The cavity size  was normal. Systolic function was normal. The estimated ejection fraction was in the range of 60% to 65%. Wall motion was normal; there were no regional wall motion abnormalities. Left ventricular diastolic function parameters were normal. - Mitral valve: There was trivial regurgitation. - Pulmonic valve: There was no regurgitation.  TCD bubble study - no PFO at rest  TCD emboli detection - negative  Ct Head Wo Contrast 12/06/2016 IMPRESSION: 1. Evolving acute large RIGHT MCA territory infarct without hemorrhagic conversion. 2. Increasing 5 mm RIGHT to LEFT midline shift without ventricular entrapment. 3.  Dense RIGHT MCA consistent with thromboembolism.   TEE Left Ventrical:  Low normal LV function.  EF 50% Mitral Valve: normal  Aortic Valve: normal  Tricuspid Valve: normal  Pulmonic Valve: normal  Left Atrium/ Left atrial appendage: no thrombi  Atrial septum: no PFO or ASD by color flow or Doppler  Aorta: normal   EEG  1.  Focal structural or other physiologic abnormality in the right hemisphere,  Consistent with location of stroke. 2.  diffuse cerebral dysfunction that is non-specific in etiology and can be seen with hypoxic/ischemic injury, toxic/metabolic encephalopathies, neurodegenerative disorders, or medication effect.     PHYSICAL EXAM  Temp:  [97.7 F (36.5 C)-98 F (36.7 C)] 97.7 F (36.5 C) (07/17 1339) Pulse Rate:  [59-72] 59 (07/17 1339) Resp:  [18-20] 18 (07/17 1339) BP: (108-122)/(58-79) 122/79 (07/17 1339) SpO2:  [98 %-100 %] 99 % (07/17 1339)  General - Well nourished, well developed, lethargic.  Ophthalmologic - Fundi not visualized due to noncooperation.  Cardiovascular - Regular rate and rhythm.  Mental Status -  Level of arousal and orientation to time, place, and person were intact. Language including expression, naming, repetition, comprehension was assessed and found intact. Fund of Knowledge was assessed and was intact.  Cranial Nerves II - XII - II - Visual field intact OU. III, IV, VI - Extraocular movements intact. V - Facial sensation intact bilaterally. VII - left lower facial weakness. VIII - Hearing & vestibular intact bilaterally. X - Palate elevates symmetrically, mild dysarthria. XI - Chin turning & shoulder shrug intact bilaterally. XII - Tongue protrusion to the left.  Motor Strength - The patient's strength was normal in RUE and RLE, however LUE 0/5, LLE proximal 2/5 and knee extension 3/5.  Bulk was normal and fasciculations were absent.   Motor Tone - Muscle tone was assessed at the neck and appendages and was increase left UE  and LE.  Reflexes - The patient's reflexes were 1+ in all extremities and she had no pathological reflexes.  Sensory - Light touch, temperature/pinprick were assessed and were symmetrical.    Coordination - The patient had normal movements in the right hand with no ataxia or dysmetria.  Tremor was absent.  Gait and Station - not tested.   ASSESSMENT/PLAN [name] is a 41 y.o. female with history of migraine headaches presenting with left-sided weakness. 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  Resultant  left facial droop and left hemiparesis   CT head - negative  MRI head - Large RIGHT MCA territory infarct with  MRA head - Suspected RIGHT MCA M1 stenosis or occlusion.   CTA H&N - Proximal right M1 conclusion. Right ICA bifurcation soft plaque. Mild left ICA bifurcation soft plaque.  TCD emboli detection - unremarkable   TCDs with bubbles - no PFO at rest  2D Echo: EF 60-65%. No source of embolus  TEE unremarkable  LE venous duplex - no DVT  LDL - 138  HgbA1c - 6.2  Hypercoagulable workup pending  VTE prophylaxis - Lovenox DIET DYS 3 Room service appropriate? Yes with Assist; Fluid consistency: Nectar Thick  No antithrombotic prior to admission, now on aspirin 325 mg daily. Recommend DAPT for 3 months and then plavix alone.  Patient counseled to be compliant with her antithrombotic medications  Ongoing aggressive stroke risk factor management  Therapy recommendations: pending  Disposition: Pending  Cerebral edema  CT head 12/03/16 - midline shift 2mm  CT head repeat 12/05/16 - midline shift 5mm  Pt neuro stable but drowsy sleepy  No need for hypertonic saline this time  Continue to monitor  Right ICA bifurcation soft plaque - questionable ulceration or intraplaque hemorrhage  Not clear etiology  Could be the source of the  stroke  Reviewed with Dr. Chestine Spore from Radiology  Hypertension  Stable Permissive hypertension (OK if < 220/120) but gradually normalize in 5-7 days Long-term BP goal normotensive The Hyperlipidemia  Home meds: No lipid lowering medications prior to admission  LDL 138, goal < 70  Now on Lipitor 80 mg daily  Continue statin at discharge  Other Stroke Risk Factors  Obesity, Body mass index is 34.91 kg/m., recommend weight loss, diet and exercise as appropriate   Migraines - chronic, 2 per months, lasting all day, using Excedrin for abortive therapy - now on topamax   Other Active Problems  Mild leukocytosis  Thyroid nodule - follow-up recommended  History of meningitis 17 years ago without residual deficit  Patient works as Development worker, [employer]  Marvel Plan, MD PhD Stroke Neurology 12/06/2016 5:01 PM    To contact Stroke Continuity provider, please refer to [email or web address]. After hours, contact General Neurology

## 2016-12-06 NOTE — Progress Notes (Signed)
  Speech Language Pathology Treatment: Dysphagia  Patient Details Name: Shannon Erickson MRN: [id number] DOB: 07/13/1975 Today's Date: 12/06/2016 Time: [id number] SLP Time Calculation (min) (ACUTE ONLY): 27 min  Assessment / Plan / Recommendation Clinical Impression  Pt asleep but did awaken for SLP session with encouragement.  Daughter in law [name] in room with pt and reports pt received thin tea yesterday and was coughing with it- advised to aspiration on MBS.  Taught [name] how to thicken thin drinks with sample packet and ordered thickener.    Pt requiring max cues to attend to left during session. Daughter in law excellent with providing pt verbal cues.  Thickened juice (apple) provided via straw   - first swallow followed by immediate weak cough, however further boluses tolerated well.  Observed pt with solids to assess for readiness for advancement.  Slow but effective mastication noted - minimal left oral labial residuals noted and pt benefited from moderate cues to clear. Anterior labial loss of secretions on left observed due to decreased labial closure.  Pt also tolerating small single ice chips well without evidence of deficits.    Will advance diet to dys3/nectar and allow single ice chips.  Reviewed recommendations with daughter in law and pt using teach back and written strategies with min cues for teach back from pt.  Will follow up and see for SLE.    HPI HPI: 41 y.o. female w hx of migraines was at home around 11pm c/o headache and then had altered mental status.  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  Pt seen for follow up for dysphagia management and SLE ordered.         SLP Plan          Recommendations  Diet recommendations: Dysphagia 3 (mechanical soft);Nectar-thick liquid;Other(comment) (single ice chips ok) Medication Administration: Via alternative means Supervision: Patient able to self feed;Full  supervision/cueing for compensatory strategies Compensations: Small sips/bites;Lingual sweep for clearance of pocketing;Slow rate;Monitor for anterior loss Postural Changes and/or Swallow Maneuvers: Seated upright 90 degrees;Upright 30-60 min after meal                Oral Care Recommendations: Oral care QID Follow up Recommendations: Other (comment) (TBD) SLP Visit Diagnosis: Dysphagia, oropharyngeal phase (R13.12)       GO              Donavan Burnetamara Ivo Moga, MS [facility] SLP [phone]   Chales AbrahamsKimball, Konya Fauble Ann 12/06/2016, 9:16 AM

## 2016-12-06 NOTE — Progress Notes (Signed)
Rehab admissions - I am following for potential acute inpatient rehab admission.  Will await completion of medical workup and therapy evaluations to help determine most appropriate rehab venue of care.  Call me for questions.  #[phone]

## 2016-12-06 NOTE — Progress Notes (Signed)
Responded to request from healthcare providers for stroke pt to create advanced directive (AD). Conferred w/ doctor, who believed pt had capacity. I concur.   Discussed entire form/choices/decisions w/ pt, and process for executing document. She understood all, was making her own choices, though may need help filling out form (which I explained she could have). Her son [name], who'll be here tonight, will be #1 and his wife, her daughter-in-law [name], will be #2 HCPOA.   A co-worker/friend of pt was also present. Advised they can ask nurse to page for chaplain at any time. They will likely do so tomorrow to complete AD -- tho' they understand pt may also take form when pt leaves and execute elsewhere.  Provided spiritual/emotional support, ministry of presence/touch, and (nondenomination[place] prayer -- which teary-eyed pt much appreciated. Chaplain available for f/u.   12/06/16 1200  Clinical Encounter Type  Visited With Patient and family together;Health care provider  Visit Type Initial;Psychological support;Spiritual support;Social support  Referral From Nurse;Physician  Spiritual Encounters  Spiritual Needs Literature;Prayer;Emotional  Stress Factors  Patient Stress Factors Health changes;Loss of control  Family Stress Factors Family relationships;Health changes;Loss of control;Major life changes   Shannon Erickson, [address] Hospital Roadhaplain

## 2016-12-06 NOTE — Evaluation (Addendum)
Physical Therapy Evaluation Patient Details Name: Shannon Erickson MRN: [id number] DOB: 1975-11-12 Today's Date: 12/06/2016   History of Present Illness  41 y.o. female w hx of migraines was at home around 11pm c/o headache and then had altered mental status.  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, insular and basal ganglia regions  Clinical Impression  Orders received for PT evaluation. Patient demonstrates deficits in functional mobility as indicated below. Will benefit from continued skilled PT to address deficits and maximize function. Will see as indicated and progress as tolerated.  Patient seen with father at bedside, flat affect but very motivated to get OOB and start therapy session. Seen in conjunction with OT therapist. Patient currently requires +2 physical assist for all aspects of mobility but is very receptive and responsive to cues during session. Tolerated EOB activity, transfer training, and OOB to chair mobility well. Improvements noted in sitting balance as session progressed. Patient with some noted inattention of left side with decreased perceptual awareness but able to attend with cues. Educated family on interactions from the left and positioning as well as other activities to engage patient. Family very supportive. At this time feel patient will need comprehensive intensive therapies to ensure maximal recovery of function. Recommend CIR consult. Patient and father eager to begin recovery.  OF NOTE: Return session to assist patient back to bed prior to transport OTF for testing. Patient able to demonstrates carry-over of cues from session. Able to self assist with positioning at chair prior to transfer and tolerated power up and shuffle to bed well. Upon return to bed, patient able to use hooking method to bring LLE up into bed prior to repositioning.    Follow Up Recommendations CIR    Equipment Recommendations   (TBD)    Recommendations for Other  Services Rehab consult     Precautions / Restrictions Precautions Precautions: Fall Restrictions Weight Bearing Restrictions: No      Mobility  Bed Mobility Overal bed mobility: Needs Assistance Bed Mobility: Supine to Sit     Supine to sit: Max assist     General bed mobility comments: patient able to follow commands to self assist LE to edge of bed by using RLE to hook around and push LLE to EOB, Max assist to elevate trunk to upright and complete rotation to EOb  Transfers Overall transfer level: Needs assistance Equipment used: 2 person hand held assist (face to face with gait belt and chuck pad) Transfers: Sit to/from Stand;Stand Pivot Transfers Sit to Stand: Mod assist;+2 physical assistance Stand pivot transfers: Max assist;+2 physical assistance       General transfer comment: Moderate assist of 2 person to power up to standing x3 during session with cues for hand placement and positioning, LLE knee block and hip extension faciliatation. VCs for head and chest upright. Patient shows good ability to use Right side during sit <> stand. +2 max assist for pivotal steps to chair, LLE buckling despite knee block and poor ability to shift weight. Multi modal cues for postural facilitation during transfer  Ambulation/Gait                Stairs            Wheelchair Mobility    Modified Rankin (Stroke Patients Only) Modified Rankin (Stroke Patients Only) Pre-Morbid Rankin Score: No symptoms Modified Rankin: Severe disability     Balance Overall balance assessment: Needs assistance Sitting-balance support: Feet supported;Bilateral upper extremity supported Sitting balance-Leahy Scale: Poor  Sitting balance - Comments: patient with left lateral lean, able to self correct. Patient tolerated EOB ~15 minutes with improvements in sitting balance throughout session with cues Postural control: Left lateral lean                                    Pertinent Vitals/Pain      Home Living Family/patient expects to be discharged to:: Private residence Living Arrangements: Children;Other relatives Available Help at Discharge: Family Type of Home: House Home Access: Stairs to enter Entrance Stairs-Rails: None Entrance Stairs-Number of Steps: 3 Home Layout: Two level;Able to live on main level with bedroom/bathroom Home Equipment: None      Prior Function Level of Independence: Independent         Comments: works full time, very independent     Hand Dominance   Dominant Hand: Right    Extremity/Trunk Assessment   Upper Extremity Assessment Upper Extremity Assessment: Defer to OT evaluation    Lower Extremity Assessment Lower Extremity Assessment: LLE deficits/detail LLE Deficits / Details: noted left side inattention, increased tone, decreased awareness and poor strength/coordination. Increased tone kicks in with task performance LLE Sensation: decreased light touch;decreased proprioception LLE Coordination: decreased fine motor;decreased gross motor    Cervical / Trunk Assessment Cervical / Trunk Assessment:  (increased body habitus)  Communication   Communication:  (dysarthic )  Cognition Arousal/Alertness: Awake/alert Behavior During Therapy: Flat affect Overall Cognitive Status: Impaired/Different from baseline Area of Impairment: Awareness;Following commands;Problem solving;Attention                   Current Attention Level: Sustained   Following Commands: Follows one step commands consistently   Awareness: Emergent Problem Solving: Requires tactile cues;Requires verbal cues General Comments: patient following commands with Right side but demonstrates poor awareness and inattention to the left side. Able to attend with max cues      General Comments      Exercises Other Exercises Other Exercises: dynamic sitting balance to faciliate midline positioning Other Exercises: Transfer  training x2 sit to stand   Assessment/Plan    PT Assessment Patient needs continued PT services  PT Problem List Decreased strength;Decreased activity tolerance;Decreased balance;Decreased coordination;Decreased mobility;Decreased safety awareness;Impaired sensation;Impaired tone       PT Treatment Interventions DME instruction;Gait training;Stair training;Functional mobility training;Therapeutic activities;Therapeutic exercise;Balance training;Neuromuscular re-education;Patient/family education    PT Goals (Current goals can be found in the Care Plan section)  Acute Rehab PT Goals Patient Stated Goal: to get her independence back PT Goal Formulation: With patient/family Time For Goal Achievement: 12/20/16 Potential to Achieve Goals: Good    Frequency Min 4X/week   Barriers to discharge        Co-evaluation PT/OT/SLP Co-Evaluation/Treatment: Yes Reason for Co-Treatment: Complexity of the patient's impairments (multi-system involvement);Necessary to address cognition/behavior during functional activity;For patient/therapist safety PT goals addressed during session: Mobility/safety with mobility         AM-PAC PT "6 Clicks" Daily Activity  Outcome Measure Difficulty turning over in bed (including adjusting bedclothes, sheets and blankets)?: Total Difficulty moving from lying on back to sitting on the side of the bed? : Total Difficulty sitting down on and standing up from a chair with arms (e.g., wheelchair, bedside commode, etc,.)?: Total Help needed moving to and from a bed to chair (including a wheelchair)?: A Lot Help needed walking in hospital room?: A Lot Help needed climbing 3-5 steps with a railing? :  A Lot 6 Click Score: 9    End of Session Equipment Utilized During Treatment: Gait belt Activity Tolerance: Patient tolerated treatment well Patient left: in chair;with call bell/phone within reach;with family/visitor present Nurse Communication: Mobility status PT  Visit Diagnosis: Difficulty in walking, not elsewhere classified (R26.2);Hemiplegia and hemiparesis Hemiplegia - Right/Left: Left Hemiplegia - dominant/non-dominant: Non-dominant Hemiplegia - caused by: Cerebral infarction    Time: [id number] -[id number] PT Time Calculation (min) (ACUTE ONLY): 24 min+ 10 min (34)   Charges:   PT Evaluation $PT Eval Moderate Complexity: 1 Procedure     PT G CodesCharlotte Crumb, PT DPT [facility] [phone]   Fabio Asa 12/06/2016, 5:22 PM

## 2016-12-07 DIAGNOSIS — I6523 Occlusion and stenosis of bilateral carotid arteries: Secondary | ICD-10-CM

## 2016-12-07 LAB — BASIC METABOLIC PANEL
Anion gap: 7 (ref 5–15)
BUN: 11 mg/dL (ref 6–20)
CO2: 17 mmol/L — ABNORMAL LOW (ref 22–32)
Calcium: 8 mg/dL — ABNORMAL LOW (ref 8.9–10.3)
Chloride: 111 mmol/L (ref 101–111)
Creatinine, Ser: 0.76 mg/dL (ref 0.44–1.00)
GFR calc Af Amer: >60 mL/min (ref >60)
GFR calc non Af Amer: >60 mL/min (ref >60)
Glucose, Bld: 105 mg/dL — ABNORMAL HIGH (ref 65–99)
Potassium: 3.8 mmol/L (ref 3.5–5.1)
Sodium: 135 mmol/L (ref 135–145)

## 2016-12-07 LAB — CBC
HCT: 39.4 % (ref 36.0–46.0)
Hemoglobin: 12.6 g/dL (ref 12.0–15.0)
MCH: 27.1 pg (ref 26.0–34.0)
MCHC: 32 g/dL (ref 30.0–36.0)
MCV: 84.7 fL (ref 78.0–100.0)
PLATELETS: 240 10*3/uL (ref 150–400)
RBC: 4.65 MIL/uL (ref 3.87–5.11)
RDW: 13.8 % (ref 11.5–15.5)
WBC: 6.7 10*3/uL (ref 4.0–10.5)

## 2016-12-07 LAB — PROTEIN C ACTIVITY: PROTEIN C ACTIVITY: 191 % — AB (ref 73–180)

## 2016-12-07 LAB — PROTEIN S, TOTAL: Protein S Ag, Total: 95 % (ref 60–150)

## 2016-12-07 LAB — CARDIOLIPIN ANTIBODIES, IGG, IGM, IGA
Anticardiolipin IgA: <9 APL U/mL (ref 0–11)
Anticardiolipin IgG: <9 GPL U/mL (ref 0–14)
Anticardiolipin IgM: 13 MPL U/mL — ABNORMAL HIGH (ref 0–12)

## 2016-12-07 LAB — BETA-2-GLYCOPROTEIN I ABS, IGG/M/A
BETA 2 GLYCO I IGG: 109 GPI IgG units — AB (ref 0–20)
BETA-2-GLYCOPROTEIN I IGA: 16 GPI IgA units (ref 0–25)

## 2016-12-07 LAB — ANTI-DNA ANTIBODY, DOUBLE-STRANDED: ds DNA Ab: <1 IU/mL (ref 0–9)

## 2016-12-07 LAB — PROTEIN S ACTIVITY: PROTEIN S ACTIVITY: 91 % (ref 63–140)

## 2016-12-07 NOTE — Progress Notes (Signed)
Late entry for OT evaluation completed 12/06/16.  PTA, pt was independent with ADL and functional mobility. She currently requires mod-max assist to participate in all aspects of ADL. She presents with inattention to the L side of her body and visual field, L sided weakness, and R gaze preference impacting her ability to participate in ADL at [facility]. Feel pt is an excellent candidate for CIR level rehabilitation post-acute D/C in order to maximize return to independence. OT will continue to follow while admitted.   12/06/16 1700  OT Visit Information  Last OT Received On 12/06/16  Assistance Needed +2  PT/OT/SLP Co-Evaluation/Treatment Yes  Reason for Co-Treatment Complexity of the patient's impairments (multi-system involvement);Necessary to address cognition/behavior during functional activity;For patient/therapist safety  OT goals addressed during session ADL's and self-care  History of Present Illness 41 y.o. female w hx of migraines was at home around 11pm c/o headache and then had altered mental status.  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, insular and basal ganglia regions  Precautions  Precautions Fall  Restrictions  Weight Bearing Restrictions No  Home Living  Family/patient expects to be discharged to: Private residence  Living Arrangements Children;Other relatives  Available Help at Discharge Family  Type of Home House  Home Access Stairs to enter  Entrance Stairs-Number of Steps 3  Entrance Stairs-Rails None  Home Layout Two level;Able to live on main level with bedroom/bathroom  Alternate Level Stairs-Number of Steps flight  Home Equipment None  Prior Function  Level of Independence Independent  Comments works full time, very independent  Geneticist, molecular (dysarthric)  Pain Assessment  Pain Assessment No/denies pain  Pain Score 8  Pain Location headache  Pain Descriptors / Indicators Constant;Headache  Pain Intervention(s) Limited  activity within patient's tolerance;Monitored during session  Cognition  Arousal/Alertness Awake/alert  Behavior During Therapy Flat affect  Overall Cognitive Status Impaired/Different from baseline  Area of Impairment Awareness;Following commands;Problem solving;Attention  Current Attention Level Sustained  Following Commands Follows one step commands consistently  Awareness Emergent  Problem Solving Requires tactile cues;Requires verbal cues;Slow processing  General Comments Patient following commands with R side but demonstrates poor awareness and inattention to the left side. Able to attend with max cues.  Upper Extremity Assessment  Upper Extremity Assessment LUE deficits/detail  LUE Deficits / Details Inattention to L UE, increased tone, and decreased active movement on command. Pt with slight movement of L UE during testing of righting reactions.   LUE Sensation decreased proprioception;decreased light touch  LUE Coordination decreased fine motor;decreased gross motor  Lower Extremity Assessment  Lower Extremity Assessment Defer to PT evaluation  ADL  Overall ADL's  Needs assistance/impaired  Grooming Moderate assistance;Sitting  Upper Body Bathing Moderate assistance;Sitting  Lower Body Bathing Maximal assistance;Sit to/from stand;+2 for physical assistance  Upper Body Dressing  Moderate assistance;Sitting  Lower Body Dressing Maximal assistance;Sit to/from stand;+2 for physical assistance  Toilet Transfer Moderate assistance;+2 for physical assistance;Stand-pivot  Toileting- Clothing Manipulation and Hygiene Maximal assistance;+2 for physical assistance;Sit to/from stand  Functional mobility during ADLs Moderate assistance;+2 for physical assistance (stand-pivot only)  General ADL Comments Pt with significant inattention to L side of body and visual field.   Vision- Assessment  Vision Assessment? Vision impaired- to be further tested in functional context  Additional Comments  Able to cross midline gaze momentarily with multimodal cues. Need to further assess. Noted R gaze preference throughout session.   Bed Mobility  Overal bed mobility Needs Assistance  Bed Mobility Supine to  Sit  Supine to sit Max assist  General bed mobility comments Patient able to follow commands to self assist LE to edge of bed by using RLE to hook around and push LLE to EOB. Max assist to elevate trunk to upright and complete rotation to EOB  Transfers  Overall transfer level Needs assistance  Equipment used 2 person hand held assist (face to face with gait belt and chuck pad)  Transfers Sit to/from Stand;Stand Pivot Transfers  Sit to Stand Mod assist;+2 physical assistance  Stand pivot transfers Max assist;+2 physical assistance  General transfer comment Moderate assist of 2 person to power up to standing x3 during session with cues for hand placement and positioning, LLE knee block and hip extension faciliatation. VCs for head and chest upright. Patient shows good ability to use Right side during sit <> stand. +2 max assist for pivotal steps to chair, LLE buckling despite knee block and poor ability to shift weight. Multi modal cues for postural facilitation during transfer  Balance  Overall balance assessment Needs assistance  Sitting-balance support Feet supported;Bilateral upper extremity supported  Sitting balance-Leahy Scale Poor  Sitting balance - Comments patient with left lateral lean, able to self correct. Patient tolerated EOB ~15 minutes with improvements in sitting balance throughout session with cues  Postural control Left lateral lean  Standing balance support Bilateral upper extremity supported  Standing balance-Leahy Scale Poor  OT - End of Session  Equipment Utilized During Treatment Gait belt  Activity Tolerance Patient tolerated treatment well  Patient left in chair;with call bell/phone within reach;with family/visitor present (later returned to assist pt back to bed  for transport)  Nurse Communication Mobility status  OT Assessment  OT Recommendation/Assessment Patient needs continued OT Services  OT Visit Diagnosis Other abnormalities of gait and mobility (R26.89);Hemiplegia and hemiparesis;Low vision, both eyes (H54.2)  Hemiplegia - Right/Left Left  Hemiplegia - dominant/non-dominant Non-Dominant  Hemiplegia - caused by Cerebral infarction  OT Problem List Decreased strength;Decreased activity tolerance;Decreased range of motion;Impaired balance (sitting and/or standing);Impaired vision/perception;Decreased safety awareness;Decreased knowledge of use of DME or AE;Decreased knowledge of precautions;Impaired UE functional use;Pain;Decreased coordination;Decreased cognition  OT Plan  OT Frequency (ACUTE ONLY) Min 3X/week  OT Treatment/Interventions (ACUTE ONLY) Self-care/ADL training;Therapeutic exercise;Neuromuscular education;Energy conservation;DME and/or AE instruction;Therapeutic activities;Patient/family education;Balance training;Splinting;Cognitive remediation/compensation;Visual/perceptual remediation/compensation  AM-PAC OT "6 Clicks" Daily Activity Outcome Measure  Help from another person eating meals? 2  Help from another person taking care of personal grooming? 2  Help from another person toileting, which includes using toliet, bedpan, or urinal? 2  Help from another person bathing (including washing, rinsing, drying)? 2  Help from another person to put on and taking off regular upper body clothing? 2  Help from another person to put on and taking off regular lower body clothing? 2  6 Click Score 12  ADL G Code Conversion CL  OT Recommendation  Follow Up Recommendations CIR;Supervision/Assistance - 24 hour  OT Equipment Other (comment) (TBD at next venue of care)  Individuals Consulted  Consulted and Agree with Results and Recommendations Patient;Family member/caregiver  Family Member Consulted father  Acute Rehab OT Goals  Patient  Stated Goal to get her independence back  OT Goal Formulation With patient/family  Time For Goal Achievement 12/20/16  Potential to Achieve Goals Good  OT Time Calculation  OT Start Time (ACUTE ONLY) 1445  OT Stop Time (ACUTE ONLY) 1509  OT Time Calculation (min) 24 min  OT General Charges  $OT Visit 1 Procedure  OT Evaluation  $  OT Eval Moderate Complexity 1 Procedure  Written Expression  Dominant Hand Right   Doristine Section, MS OTR/L  Pager: [phone]

## 2016-12-07 NOTE — Evaluation (Signed)
Speech Language Pathology Evaluation Patient Details Name: Shannon Erickson MRN: [id number] DOB: 02/02/1976 Today's Date: 12/07/2016 Time: 0930-1010 SLP Time Calculation (min) (ACUTE ONLY): 40 min  Problem List:  Patient Active Problem List   Diagnosis Date Noted  . Pain   . Prediabetes   . Dysphagia, post-stroke   . Carotid atherosclerosis, bilateral   . Thyroid nodule   . Hyperlipidemia   . Chronic migraine without aura without status migrainosus, not intractable   . Headache 12/03/2016  . Hypokalemia 12/03/2016  . 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 Left-sided weakness    Past Medical History:  Past Medical History:  Diagnosis Date  . Migraine    Past Surgical History:  Past Surgical History:  Procedure Laterality Date  . CESAREAN SECTION Bilateral 1995, 2002  . TEE WITHOUT CARDIOVERSION N/A 12/05/2016   Procedure: TRANSESOPHAGEAL ECHOCARDIOGRAM (TEE);  Surgeon: Elease HashimotoNahser, Deloris PingPhilip J, MD;  [facility];  Service: Cardiovascular;  Laterality: N/A;   HPI:  41 y.o. female w hx of migraines was at home around 11pm c/o headache and then had altered mental status.  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  Pt seen for follow up for dysphagia management and SLE ordered.      Assessment / Plan / Recommendation Clinical Impression  Pt presents with cognitive linguistic difficulties resulting in delayed processing of information, decreased attention to left and dysarthria (imprecise articulation, dysprosody, dysphonia).  MOCA for blind administered with pt scoring 16/22-  areas of strengths include repetition, orientation, and verbal attention.  Pt is not verbose and answers SlP questions mostly in short brief sentences.  She will benefit from skilled SLP to maximize cognitive linguistic and swallowing abilities to decrease caregiver burden and aid rehabilitation.    Using teach back, reviewed with pt/family *father, son,  daughter in law* to recommendations, establishment of goals and compensation strategies with moderate cues.  Recommend CIR as pt appears with excellent support and is motivated to improve.     SLP Assessment  SLP Recommendation/Assessment: Patient needs continued Speech Lanaguage Pathology Services SLP Visit Diagnosis: Cognitive communication deficit (R41.841)    Follow Up Recommendations  Inpatient Rehab    Frequency and Duration min 2x/week  2 weeks      SLP Evaluation Cognition  Overall Cognitive Status: Impaired/Different from baseline Arousal/Alertness: Awake/alert Orientation Level: Oriented X4 Attention: Sustained;Selective Sustained Attention: Appears intact Selective Attention: Impaired Selective Attention Impairment: Functional basic Memory: Impaired Memory Impairment: Retrieval deficit Problem Solving: Impaired Safety/Judgment: Appears intact       Comprehension  Auditory Comprehension Overall Auditory Comprehension: Appears within functional limits for tasks assessed Yes/No Questions: Not tested Commands: Not tested Conversation: Complex Other Conversation Comments: delayed processing of information with delayed responses,  patient answers questions but does not generally expand on information; pt named 4 word in 60 seconds Interfering Components: Attention Visual Recognition/Discrimination Discrimination: Within Function Limits Reading Comprehension Reading Status: Within funtional limits (with cues to attend to left)    Expression Expression Primary Mode of Expression: Verbal Verbal Expression Initiation: Impaired Level of Generative/Spontaneous Verbalization: Sentence Repetition: No impairment (except delay ) Naming: Not tested Pragmatics: Impairment Impairments: Dysprosody;Abnormal affect;Monotone;Eye contact Non-Verbal Means of Communication: Not applicable Written Expression Dominant Hand: Right Written Expression: Not tested   Oral / Motor   Oral Motor/Sensory Function Overall Oral Motor/Sensory Function: Mild impairment Facial ROM: Reduced left Facial Symmetry: Abnormal symmetry left Facial Strength: Reduced left Facial Sensation: Reduced left Lingual ROM: Reduced left Lingual  Symmetry: Abnormal symmetry left Lingual Strength: Reduced Lingual Sensation: Reduced Motor Speech Overall Motor Speech: Impaired Respiration: Impaired Level of Impairment: Sentence (pt with decreased breath support/phonation strength) Resonance: Within functional limits Articulation: Impaired Level of Impairment: Phrase Intelligibility: Intelligibility reduced Word: 75-100% accurate Phrase: 75-100% accurate Sentence: 50-74% accurate Conversation: Not tested Motor Planning: Witnin functional limits Motor Speech Errors: Not applicable Effective Techniques: Increased vocal intensity;Over-articulate   GO                    [name] 12/07/2016, 11:12 AM  Donavan Burnet, MS [facility] SLP [phone]

## 2016-12-07 NOTE — Progress Notes (Signed)
Occupational Therapy Treatment Patient Details Name: Shannon DiceCynthia Erickson MRN: [id number] DOB: 06/16/1975 Today's Date: 12/07/2016    History of present illness 41 y.o. female w hx of migraines was at home around 11pm c/o headache and then had altered mental status.  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 She was able to better attend to L side of body and environment with overall moderate questioning cues. Facilitated improved functional use of L UE with weight bearing activities incorporated into ADL participation. Pt continues to require max assist +2 for toilet transfers and toileting hygiene and pt assisted in pericare after episode of incontinence of bowel. Initiated education with pt concerning self ROM techniques for L shoulder and elbow movement and she was able to complete with supervision. D/C recommendation remains appropriate. OT will continue to follow while admitted.    Follow Up Recommendations  CIR;Supervision/Assistance - 24 hour    Equipment Recommendations  Other (comment) (TBD at next venue of care)    Recommendations for Other Services Rehab consult    Precautions / Restrictions Precautions Precautions: Fall Restrictions Weight Bearing Restrictions: No       Mobility Bed Mobility Overal bed mobility: Needs Assistance Bed Mobility: Supine to Sit     Supine to sit: Mod assist     General bed mobility comments: Able to bring B LE to EOB without assistance. Mod assist to raise trunk from bed.   Transfers Overall transfer level: Needs assistance Equipment used: 2 person hand held assist Transfers: Sit to/from UGI CorporationStand;Stand Pivot Transfers Sit to Stand: Mod assist;+2 physical assistance Stand pivot transfers: Max assist;+2 physical assistance       General transfer comment: Max assist +2 to take pivotal steps to chair. Required L LE knee  blocking as well as facilitation at hips.     Balance Overall balance assessment: Needs assistance Sitting-balance support: Feet supported;Bilateral upper extremity supported Sitting balance-Leahy Scale: Fair Sitting balance - Comments: Able to progress to min guard level seated at EOB with multimodal cues to maintain midline positioning this session.  Postural control: Left lateral lean Standing balance support: Bilateral upper extremity supported;During functional activity Standing balance-Leahy Scale: Poor                             ADL either performed or assessed with clinical judgement   ADL Overall ADL's : Needs assistance/impaired                         Toilet Transfer: +2 for physical assistance;Stand-pivot;Maximal assistance   Toileting- Clothing Manipulation and Hygiene: Maximal assistance;+2 for physical assistance;Sit to/from stand         General ADL Comments: Max assist +2 to maintain balance during pericare. Upon standing for simulated toilet transfer, pt with incontinence of bowel.      Vision   Additional Comments: Able to cross midline gaze with moderate multimodal cueing this session.    Perception     Praxis      Cognition Arousal/Alertness: Awake/alert Behavior During Therapy: Flat affect Overall Cognitive Status: Impaired/Different from baseline Area of Impairment: Awareness;Problem solving                           Awareness: Emergent Problem Solving: Requires tactile cues;Requires verbal cues;Slow processing General Comments:  Inattention to L side of body and environment remains. Pt able to attend to L side of body with overall mod verbal and questioning cues.         Exercises Exercises: Other exercises Other Exercises Other Exercises: Self ROM with L UE for shoulder flexion and elbow flexion/extension.  Other Exercises: Facilitated improved awareness of L side of environment with scanning tasks.     Shoulder Instructions       General Comments Significant time spent with nursing providing education concerning safe transfer methods.     Pertinent Vitals/ Pain       Pain Assessment: No/denies pain  Home Living                                          Prior Functioning/Environment              Frequency  Min 3X/week        Progress Toward Goals  OT Goals(current goals can now be found in the care plan section)  Progress towards OT goals: Progressing toward goals  Acute Rehab OT Goals Patient Stated Goal: to get her independence back OT Goal Formulation: With patient/family Time For Goal Achievement: 12/20/16 Potential to Achieve Goals: Good  Plan Discharge plan remains appropriate    Co-evaluation                 AM-PAC PT "6 Clicks" Daily Activity     Outcome Measure   Help from another person eating meals?: A Lot Help from another person taking care of personal grooming?: A Lot Help from another person toileting, which includes using toliet, bedpan, or urinal?: A Lot Help from another person bathing (including washing, rinsing, drying)?: A Lot Help from another person to put on and taking off regular upper body clothing?: A Lot Help from another person to put on and taking off regular lower body clothing?: A Lot 6 Click Score: 12    End of Session Equipment Utilized During Treatment: Gait belt  OT Visit Diagnosis: Other abnormalities of gait and mobility (R26.89);Hemiplegia and hemiparesis;Low vision, both eyes (H54.2) Hemiplegia - Right/Left: Left Hemiplegia - dominant/non-dominant: Non-Dominant Hemiplegia - caused by: Cerebral infarction   Activity Tolerance Patient tolerated treatment well   Patient Left in chair;with call bell/phone within reach;with family/visitor present   Nurse Communication Mobility status        Time: 1345-1430 OT Time Calculation (min): 45 min  Charges: OT General Charges $OT Visit: 1  Procedure OT Treatments $Self Care/Home Management : 38-52 mins  Doristine Section, MS OTR/L  Pager: [phone]    Shannon Erickson 12/07/2016, 5:59 PM

## 2016-12-07 NOTE — Progress Notes (Signed)
[facility]                                                                              Patient Demographics  Shannon Erickson, is a 41 y.o. female, DOB - 07/04/1975, ZOX:[id number]  Admit date - 12/03/2016   Admitting Physician Pearson GrippeJames Kim, MD  Outpatient Primary MD for the patient is Patient, No Pcp Per  Outpatient specialists:   LOS - 4  days   Medical records reviewed and are as summarized below:    Chief Complaint  Patient presents with  . Code Stroke       Brief summary   Patient is a 41 year old female with history of migraines presented with severe headache and altered mental status. Patient presented to ED and had further complaints of left-sided jerking and weakness. MRI revealed multiple embolic strokes.   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 MRA with suspected right MCA M1 stenosis or occlusion - Patient presented with left facial droop and left hemiparesis - CT angiogram head and neck with the proximal right M1 occlusion - 2-D echo with EF of 60-65% with no source of emboli - TEE unremarkable negative for PFO or ASO or endocarditis - Lower extremity Dopplers negative for DVT - Hypercoagulability workup in progress - Lipid panel showed LDL of 138 - Endocarditis ruled out, patient placed on aspirin 325 mg daily, Plavix, statin - TCD's showed no PFO, no MES. CTA neck concerning for right ICA soft plaque as well as left ICA with mild soft plaque - EEG showed right hemispheric slowing, diffuse slowing of the waking background - CT head with evolving acute large right MCA territory infarct without hemorrhagic conversion increasing 5 mm right-to-left midline shift. Discussed with Dr. Roda ShuttersXu recommended repeat CT head in a.m., if no worsening midline shift, may DC to CIR in  a.m.  Principal problems   right ICA bifurcation plaque like structure -Per neurology   Hypertension -Continue gentle hydration, permissive hypertension at this time   Hyperlipidemia LDL 138, continue Lipitor, goal LDL less than 70-     Chronic migraine without aura without status migrainosus, not intractable - Currently stable, continue Topamax     Thyroid nodule - TSH within normal limits, will need thyroid ultrasound on follow-up in the outpatient setting     Dysphagia, post-stroke - Continue dysphagia 3 diet nectar thick   Code Status: full  DVT Prophylaxis:  Lovenox Family Communication: Discussed in detail with the patient, all imaging results, lab results explained to the patient, daughter and husband    Disposition Plan: Inpatient rehabilitation likely in a  Time spent in minutes   35 minutes  Procedures:  MRI, MRA, TCD,  2-D echo: EF of 60-65% with no source of emboli Carotid Dopplers Transcranial Doppler bubble study completed. Dr. Roda ShuttersXu performed. No apparent PFO at rest.  TEE normal  Consultants:   Neurology  Antimicrobials:      Medications  Scheduled Meds: . aspirin  325 mg Oral  Daily  . atorvastatin  80 mg Oral q1800  . clopidogrel  75 mg Oral Daily  . cyanocobalamin  1,000 mcg Intramuscular Daily  . enoxaparin (LOVENOX) injection  40 mg Subcutaneous Q24H  . sodium chloride flush  3 mL Intravenous Q12H  . topiramate  50 mg Oral Daily   Continuous Infusions: . sodium chloride 125 mL/hr at 12/07/16 0838   PRN Meds:.ketorolac, RESOURCE THICKENUP CLEAR   Antibiotics   Anti-infectives    None        Subjective:   Shannon Erickson was seen and examined today. Complaints, waking up much more alert than yesterday per family. Left arm weakness still persists, feeling a little more strength in left lower leg.  Patient denies dizziness, chest pain, shortness of breath, abdominal pain, N/V/D/C.  Objective:   Vitals:   12/06/16 2140  12/07/16 0119 12/07/16 0343 12/07/16 0839  BP: 123/67 (!) 119/45  (!) 161/94  Pulse: 69 73  73  Resp: 18 18  20   Temp: 97.7 F (36.5 C) 97.9 F (36.6 C)  98 F (36.7 C)  TempSrc: Oral Oral  Axillary  SpO2: 99% 98%  100%  Weight:   89.1 kg (196 lb 6.9 oz)   Height:        Intake/Output Summary (Last 24 hours) at 12/07/16 1316 Last data filed at 12/06/16 2154  Gross per 24 hour  Intake                0 ml  Output             1000 ml  Net            -1000 ml     Wt Readings from Last 3 Encounters:  12/07/16 89.1 kg (196 lb 6.9 oz)     Exam  General: Alert and oriented x 3, NAD  Eyes: PERRLA, EOMI, Anicteric Sclera,  HEENT:  Atraumatic, normocephalic, normal oropharynx  Cardiovascular: S1 S2 auscultated, no rubs, murmurs or gallops. Regular rate and rhythm.  Respiratory: Clear to auscultation bilaterally, no wheezing, rales or rhonchi  Gastrointestinal: Soft, nontender, nondistended, + bowel sounds  Ext: no pedal edema bilaterally  Neuro: Alert and oriented 3, right upper and lower extremity 5/5, LUE 0/5, LLE 1-2/5  Musculoskeletal: No digital cyanosis, clubbing  Skin: No rashes  Psych: Normal affect and demeanor, alert and oriented x3    Data Reviewed:  I have personally reviewed following labs and imaging studies  Micro Results No results found for this or any previous visit (from the past 240 hour(s)).  Radiology Reports Ct Angio Head W Or Wo Contrast  Result Date: 12/03/2016 CLINICAL DATA:  Right MCA territory infarct.  Migraine headaches. EXAM: CT ANGIOGRAPHY HEAD AND NECK TECHNIQUE: Multidetector CT imaging of the head and neck was performed using the standard protocol during bolus administration of intravenous contrast. Multiplanar CT image reconstructions and MIPs were obtained to evaluate the vascular anatomy. Carotid stenosis measurements (when applicable) are obtained utilizing NASCET criteria, using the distal internal carotid diameter as the  denominator. CONTRAST:  50 mL Isovue 370 was administered into the right antecubital fossa with contrast extravasation. Contrast extravasation consultation: Type of contrast: Isovue 370 site of extravasation: Right antecubital fossa Estimated volume of extravasation: <50 ml Area of extravasation scanned with CT? no PATIENT'S SIGNS AND SYMPTOMS Skin blistering/ulceration: no Decrease capillary refill: no Change in skin color: no Decreased motor function or severe tightness: Unable to assess due to stroke Decreased pulses distal to site of extravasation:  no Altered sensation: Unable to assess due to stroke Increasing pain or signs of increased swelling during observation: no TREATMENT Limb elevation: yes Ice packs applied: yes Heat pads applied: yes Plastic surgery consulted? no DOCUMENTATION AND FOLLOW-UP Site contrast extravasation forms submitted? yes Post extravasation orders completed? yes Was additional follow up assigned to PA's? Yes COMPARISON:  MRI brain from the same day at 11:55 a.m. CT head without contrast at 3:08 a.m. FINDINGS: CT HEAD FINDINGS Brain: Hypoattenuation involving the right temporal lobe, insular cortex, and basal ganglia matches the diffusion abnormality. There is effacement of the sulci. Midline shift is 2 mm. There is no hemorrhage. A hyperdense right MCA is noted. No new infarcts are present. Vascular: Hyperdense right MCA. Skull: The calvarium is intact. No focal lytic or blastic lesions are present. Sinuses: Minimal mucosal thickening is present in the left maxillary sinus. The remaining paranasal sinuses and the mastoid air cells are clear. Orbits: The globes and orbits are within normal limits. Review of the MIP images confirms the above findings CTA NECK FINDINGS Aortic arch: There is a common origin of the left common carotid artery and the innominate artery. There is no focal stenosis of the great vessel origins at the aorta. No significant vascular calcifications are present at  the aorta. Right carotid system: The right common carotid artery is within normal limits. Soft tissue plaque is noted along the posterior proximal right internal carotid artery at the bifurcation. There is a shelf-like plaque. The lumen is narrowed to 3 mm maximally. The more distal right internal carotid artery is within normal limits. Left carotid system: The left common carotid artery is within normal limits. Atherosclerotic changes are noted at the bifurcation. The cervical left internal carotid artery is normal. Vertebral arteries: The vertebral arteries both originate from the subclavian arteries without significant stenoses. Both vertebral arteries are small without significant focal stenosis in the neck. Skeleton: Age advanced degenerative changes are noted at C5-6 and C6-7. Left greater than right uncovertebral spurring is present at C4-5. Vertebral body heights are maintained. No focal lytic or blastic lesions are present. Other neck: 11 mm inferior thyroid nodule is present on the left. No other discrete thyroid lesions are present. There is no significant adenopathy. No focal mucosal lesions are evident. The salivary glands are within normal limits. Upper chest: Ground-glass attenuation of the lung apices bilaterally likely reflects edema or atelectasis. No focal nodule or mass lesion is present. Review of the MIP images confirms the above findings CTA HEAD FINDINGS Anterior circulation: Internal carotid artery's are within normal limits through the ICA termini bilaterally. The A1 segments are patent. Anterior communicating artery is patent. There is a proximal occlusion of the right M1 segment. The left M1 segment is within normal limits. The left MCA bifurcation is unremarkable. Left MCA and right ACA branch vessels are within normal limits. Anterior and superior right MCA branch vessels perfuse. Posterior and inferior branch vessels are occluded. Posterior circulation: The vertebral arteries are  small. PICA origins are visualized and is normal. The basilar artery is small, terminating at the superior cerebellar artery is. Fetal type posterior cerebral arteries are present bilaterally. The PCA branch vessels are within normal limits. Venous sinuses: The dural sinuses are patent. The right transverse sinus is dominant. The straight sinus deep cerebral veins are intact. Cortical veins are within normal limits. Anatomic variants: Bilateral fetal type posterior cerebral artery is. Delayed phase: The infarct is again noted. No pathologic enhancement is evident. Review of the MIP  images confirms the above findings IMPRESSION: 1. Proximal right M1 conclusion. 2. Expected evolution of right temporal lobe, insular cortex, and basal ganglia nonhemorrhagic infarction. 3. Focal irregularity at the right carotid bifurcation with a shelf-like soft tissue plaque but no significant stenosis relative to the more distal vessel. 4. More mild atherosclerotic changes at the left carotid bifurcation. 5. Fetal type posterior cerebral artery is bilaterally. 6. 11 mm thyroid nodule. Consider further evaluation with thyroid ultrasound. If patient is clinically hyperthyroid, consider nuclear medicine thyroid uptake and scan. Thyroid ultrasound can be done following discharge. These results were called by telephone at the time of interpretation on 12/03/2016 at 9:00 pm to Dr. Noel Christmas, who verbally acknowledged these results. Electronically Signed   By: Marin Roberts M.D.   On: 12/03/2016 21:15   Ct Head Wo Contrast  Result Date: 12/06/2016 CLINICAL DATA:  Stroke.  History of migraine. EXAM: CT HEAD WITHOUT CONTRAST TECHNIQUE: Contiguous axial images were obtained from the base of the skull through the vertex without intravenous contrast. COMPARISON:  CT HEAD December 03, 2016 FINDINGS: BRAIN: Evolving acute to early subacute RIGHT temporal, frontal, parietal and basal ganglia infarct. Partially effaced RIGHT lateral  ventricle without entrapment. 5 mm RIGHT to LEFT midline shift, increased from 3 mm. No intraparenchymal hemorrhage. Basal cisterns are patent. No abnormal extra-axial fluid collections. VASCULAR: Dense RIGHT middle cerebral artery. SKULL/SOFT TISSUES: No skull fracture. No significant soft tissue swelling. ORBITS/SINUSES: The included ocular globes and orbital contents are normal.Trace paranasal sinus mucosal thickening. Dehiscent RIGHT jugular bulb. Under pneumatized bilateral mastoid air cells with trace effusions, no air cell coalescence. OTHER: None. IMPRESSION: 1. Evolving acute large RIGHT MCA territory infarct without hemorrhagic conversion. 2. Increasing 5 mm RIGHT to LEFT midline shift without ventricular entrapment. 3. Dense RIGHT MCA consistent with thromboembolism. Electronically Signed   By: Awilda Metro M.D.   On: 12/06/2016 06:31   Ct Angio Neck W Or Wo Contrast  Result Date: 12/03/2016 CLINICAL DATA:  Right MCA territory infarct.  Migraine headaches. EXAM: CT ANGIOGRAPHY HEAD AND NECK TECHNIQUE: Multidetector CT imaging of the head and neck was performed using the standard protocol during bolus administration of intravenous contrast. Multiplanar CT image reconstructions and MIPs were obtained to evaluate the vascular anatomy. Carotid stenosis measurements (when applicable) are obtained utilizing NASCET criteria, using the distal internal carotid diameter as the denominator. CONTRAST:  50 mL Isovue 370 was administered into the right antecubital fossa with contrast extravasation. Contrast extravasation consultation: Type of contrast: Isovue 370 site of extravasation: Right antecubital fossa Estimated volume of extravasation: <50 ml Area of extravasation scanned with CT? no PATIENT'S SIGNS AND SYMPTOMS Skin blistering/ulceration: no Decrease capillary refill: no Change in skin color: no Decreased motor function or severe tightness: Unable to assess due to stroke Decreased pulses distal to  site of extravasation: no Altered sensation: Unable to assess due to stroke Increasing pain or signs of increased swelling during observation: no TREATMENT Limb elevation: yes Ice packs applied: yes Heat pads applied: yes Plastic surgery consulted? no DOCUMENTATION AND FOLLOW-UP Site contrast extravasation forms submitted? yes Post extravasation orders completed? yes Was additional follow up assigned to PA's? Yes COMPARISON:  MRI brain from the same day at 11:55 a.m. CT head without contrast at 3:08 a.m. FINDINGS: CT HEAD FINDINGS Brain: Hypoattenuation involving the right temporal lobe, insular cortex, and basal ganglia matches the diffusion abnormality. There is effacement of the sulci. Midline shift is 2 mm. There is no hemorrhage. A hyperdense right MCA is  noted. No new infarcts are present. Vascular: Hyperdense right MCA. Skull: The calvarium is intact. No focal lytic or blastic lesions are present. Sinuses: Minimal mucosal thickening is present in the left maxillary sinus. The remaining paranasal sinuses and the mastoid air cells are clear. Orbits: The globes and orbits are within normal limits. Review of the MIP images confirms the above findings CTA NECK FINDINGS Aortic arch: There is a common origin of the left common carotid artery and the innominate artery. There is no focal stenosis of the great vessel origins at the aorta. No significant vascular calcifications are present at the aorta. Right carotid system: The right common carotid artery is within normal limits. Soft tissue plaque is noted along the posterior proximal right internal carotid artery at the bifurcation. There is a shelf-like plaque. The lumen is narrowed to 3 mm maximally. The more distal right internal carotid artery is within normal limits. Left carotid system: The left common carotid artery is within normal limits. Atherosclerotic changes are noted at the bifurcation. The cervical left internal carotid artery is normal. Vertebral  arteries: The vertebral arteries both originate from the subclavian arteries without significant stenoses. Both vertebral arteries are small without significant focal stenosis in the neck. Skeleton: Age advanced degenerative changes are noted at C5-6 and C6-7. Left greater than right uncovertebral spurring is present at C4-5. Vertebral body heights are maintained. No focal lytic or blastic lesions are present. Other neck: 11 mm inferior thyroid nodule is present on the left. No other discrete thyroid lesions are present. There is no significant adenopathy. No focal mucosal lesions are evident. The salivary glands are within normal limits. Upper chest: Ground-glass attenuation of the lung apices bilaterally likely reflects edema or atelectasis. No focal nodule or mass lesion is present. Review of the MIP images confirms the above findings CTA HEAD FINDINGS Anterior circulation: Internal carotid artery's are within normal limits through the ICA termini bilaterally. The A1 segments are patent. Anterior communicating artery is patent. There is a proximal occlusion of the right M1 segment. The left M1 segment is within normal limits. The left MCA bifurcation is unremarkable. Left MCA and right ACA branch vessels are within normal limits. Anterior and superior right MCA branch vessels perfuse. Posterior and inferior branch vessels are occluded. Posterior circulation: The vertebral arteries are small. PICA origins are visualized and is normal. The basilar artery is small, terminating at the superior cerebellar artery is. Fetal type posterior cerebral arteries are present bilaterally. The PCA branch vessels are within normal limits. Venous sinuses: The dural sinuses are patent. The right transverse sinus is dominant. The straight sinus deep cerebral veins are intact. Cortical veins are within normal limits. Anatomic variants: Bilateral fetal type posterior cerebral artery is. Delayed phase: The infarct is again noted. No  pathologic enhancement is evident. Review of the MIP images confirms the above findings IMPRESSION: 1. Proximal right M1 conclusion. 2. Expected evolution of right temporal lobe, insular cortex, and basal ganglia nonhemorrhagic infarction. 3. Focal irregularity at the right carotid bifurcation with a shelf-like soft tissue plaque but no significant stenosis relative to the more distal vessel. 4. More mild atherosclerotic changes at the left carotid bifurcation. 5. Fetal type posterior cerebral artery is bilaterally. 6. 11 mm thyroid nodule. Consider further evaluation with thyroid ultrasound. If patient is clinically hyperthyroid, consider nuclear medicine thyroid uptake and scan. Thyroid ultrasound can be done following discharge. These results were called by telephone at the time of interpretation on 12/03/2016 at 9:00 pm to Dr.  Noel Christmas, who verbally acknowledged these results. Electronically Signed   By: Marin Roberts M.D.   On: 12/03/2016 21:15   Mr Brain Wo Contrast  Result Date: 12/03/2016 CLINICAL DATA:  Slurred speech and LEFT-sided weakness beginning earlier today. EXAM: MRI HEAD WITHOUT CONTRAST TECHNIQUE: Multiplanar, multiecho pulse sequences of the brain and surrounding structures were obtained without intravenous contrast. COMPARISON:  Noncontrast CT head earlier today. FINDINGS: Significant motion degradation. The study is diagnostic for the clinical indication, but small or subtle lesions could be overlooked. Brain: Large area of restricted diffusion, RIGHT MCA territory, involves the temporal lobe, small portion of the frontal lobe, insula, and basal ganglia, as well as the regional white matter. Findings consistent with acute cerebral infarction. This is not visible on prior CT. Early mass effect RIGHT to LEFT of 1-2 mm. Gradient sequence shows significant motion degradation, pre occluding definitive excess but of hemorrhage. No gross lobar hemorrhage. No mass lesion,  hydrocephalus, or extra-axial fluid. Vascular: Suspected RIGHT MCA M1 stenosis or occlusion. This is difficult to confirm due to motion degradation. Skull and upper cervical spine: Normal marrow signal. Sinuses/Orbits: Negative. Other: None. IMPRESSION: Motion degraded exam. Large RIGHT MCA territory nonhemorrhagic infarct as described. There is early mass effect RIGHT-to-LEFT of 1-2 mm. Close follow-up recommended. Within limits for detection on this motion degraded exam, there is no gross hemorrhage, although small areas could be overlooked. Consider CT follow-up. Suspected RIGHT MCA M1 stenosis or occlusion. This could be further assessed with CT angiography of the head and neck, but only after the patient is more cooperative. These results were called by telephone at the time of interpretation on 12/03/2016 at 1:04 pm to Dr. Wilford Corner, who verbally acknowledged these results. Electronically Signed   By: Elsie Stain M.D.   On: 12/03/2016 13:05   Dg Swallowing Func-speech Pathology  Result Date: 12/03/2016 Objective Swallowing Evaluation: Type of Study: MBS-Modified Barium Swallow Study Patient Details Name: Shannon Erickson MRN: [id number] Date of Birth: 11-24-1975 Today's Date: 12/03/2016 Time: SLP Start Time (ACUTE ONLY): 1238-SLP Stop Time (ACUTE ONLY): 1252 SLP Time Calculation (min) (ACUTE ONLY): 14 min Past Medical History: Past Medical History: Diagnosis Date . Migraine  Past Surgical History: Past Surgical History: Procedure Laterality Date . CESAREAN SECTION Bilateral 1995, 2002 HPI: 41 y.o. female w hx of migraines was at home around 11pm c/o headache and then had altered mental status; BSE completed and pt kept NPO d/t overt s/s of aspiration including delayed throat clearing/cough with thin liquids; anterior left loss; CT negative; MRI pending No Data Recorded Assessment / Plan / Recommendation CHL IP CLINICAL IMPRESSIONS 12/03/2016 Clinical Impression Pt presents with mild-moderate oropharyngeal  dysphagia characterized by premature spillage to pyriform sinuses prior to swallow triggering d/t sensory impairment; anterior loss noted with liquids either via tsp and/or cup; chin tuck and head turn left attempted with larger volumes of thin and nectar-thickened liquids, but was inconsistent d/t pt lethargy, subsequently, larger volumes of thin entered laryngeal vestibule with cup sips and to level of the vocal folds with straw sips and ejected out; no penetration and/or aspiration noted with nectar-thickened liquids via cup/straw, but pt is at risk with larger amounts; mechanical/soft consistency attempted, but removed from oral cavity d/t impaired mastication/poor lingual control/manipulation paired with pt lethargy; recommend Dysphagia 1 (puree) with nectar-thickened liquids via individual straw sips to reduce anterior loss presented on right side pending pt alertness level and increased appetite with swallowing precautions in place during PO intake SLP Visit Diagnosis Dysphagia, oropharyngeal  phase (R13.12) Attention and concentration deficit following -- Frontal lobe and executive function deficit following -- Impact on safety and function Moderate aspiration risk   CHL IP TREATMENT RECOMMENDATION 12/03/2016 Treatment Recommendations Therapy as outlined in treatment plan below   Prognosis 12/03/2016 Prognosis for Safe Diet Advancement Good Barriers to Reach Goals -- Barriers/Prognosis Comment -- CHL IP DIET RECOMMENDATION 12/03/2016 SLP Diet Recommendations Dysphagia 1 (Puree) solids;Nectar thick liquid Liquid Administration via Cup;Straw;Other (Comment) Medication Administration Crushed with puree Compensations Slow rate;Small sips/bites;Lingual sweep for clearance of pocketing;Monitor for anterior loss Postural Changes --   CHL IP OTHER RECOMMENDATIONS 12/03/2016 Recommended Consults -- Oral Care Recommendations Oral care BID Other Recommendations Order thickener from pharmacy   CHL IP FOLLOW UP  RECOMMENDATIONS 12/03/2016 Follow up Recommendations Other (comment)   CHL IP FREQUENCY AND DURATION 12/03/2016 Speech Therapy Frequency (ACUTE ONLY) min 2x/week Treatment Duration 1 week      CHL IP ORAL PHASE 12/03/2016 Oral Phase Impaired Oral - Pudding Teaspoon -- Oral - Pudding Cup -- Oral - Honey Teaspoon -- Oral - Honey Cup -- Oral - Nectar Teaspoon Left anterior bolus loss;Premature spillage Oral - Nectar Cup -- Oral - Nectar Straw Premature spillage Oral - Thin Teaspoon Left anterior bolus loss;Premature spillage Oral - Thin Cup Left anterior bolus loss;Premature spillage Oral - Thin Straw Premature spillage Oral - Puree Premature spillage Oral - Mech Soft -- Oral - Regular -- Oral - Multi-Consistency -- Oral - Pill -- Oral Phase - Comment (No Data)  CHL IP PHARYNGEAL PHASE 12/03/2016 Pharyngeal Phase Impaired Pharyngeal- Pudding Teaspoon -- Pharyngeal -- Pharyngeal- Pudding Cup -- Pharyngeal -- Pharyngeal- Honey Teaspoon -- Pharyngeal -- Pharyngeal- Honey Cup -- Pharyngeal -- Pharyngeal- Nectar Teaspoon Delayed swallow initiation-pyriform sinuses Pharyngeal Material does not enter airway Pharyngeal- Nectar Cup Delayed swallow initiation-pyriform sinuses Pharyngeal Material does not enter airway Pharyngeal- Nectar Straw Delayed swallow initiation-pyriform sinuses Pharyngeal Material does not enter airway Pharyngeal- Thin Teaspoon Delayed swallow initiation-pyriform sinuses Pharyngeal Material enters airway, remains ABOVE vocal cords then ejected out Pharyngeal- Thin Cup Delayed swallow initiation-pyriform sinuses;Compensatory strategies attempted (chin tuck; head turn left) Pharyngeal Material enters airway, remains ABOVE vocal cords then ejected out Pharyngeal- Thin Straw Delayed swallow initiation-pyriform sinuses;Penetration/Aspiration before swallow;Trace aspiration;Compensatory strategies attempted (chin tuck/head turn left) Pharyngeal Material enters airway, CONTACTS cords and then ejected out Pharyngeal-  Puree Delayed swallow initiation-vallecula Pharyngeal -- Pharyngeal- Mechanical Soft NT Pharyngeal -- Pharyngeal- Regular -- Pharyngeal -- Pharyngeal- Multi-consistency -- Pharyngeal -- Pharyngeal- Pill -- Pharyngeal -- Pharyngeal Comment (No Data)  CHL IP CERVICAL ESOPHAGEAL PHASE 12/03/2016 Cervical Esophageal Phase WFL Pudding Teaspoon -- Pudding Cup -- Honey Teaspoon -- Honey Cup -- Nectar Teaspoon -- Nectar Cup -- Nectar Straw -- Thin Teaspoon -- Thin Cup -- Thin Straw -- Puree -- Mechanical Soft -- Regular -- Multi-consistency -- Pill -- Cervical Esophageal Comment -- CHL IP GO 12/03/2016 Functional Assessment Tool Used NOMS; clinical judgment Functional Limitations Swallowing Swallow Current Status (Z6109) CK Swallow Goal Status (U0454) CJ Swallow Discharge Status (U9811) (None) Motor Speech Current Status (B1478) (None) Motor Speech Goal Status (G9562) (None) Motor Speech Goal Status (Z3086) (None) Spoken Language Comprehension Current Status (V7846) (None) Spoken Language Comprehension Goal Status (N6295) (None) Spoken Language Comprehension Discharge Status (M8413) (None) Spoken Language Expression Current Status (K4401) (None) Spoken Language Expression Goal Status (U2725) (None) Spoken Language Expression Discharge Status (D6644) (None) Attention Current Status (I3474) (None) Attention Goal Status (Q5956) (None) Attention Discharge Status (L8756) (None) Memory Current Status (E3329) (None) Memory Goal Status (J1884) (  None) Memory Discharge Status [phone]) (None) Voice Current Status [phone]) (None) Voice Goal Status (U9811) (None) Voice Discharge Status [phone]) (None) Other Speech-Language Pathology Functional Limitation Current Status [phone]) (None) Other Speech-Language Pathology Functional Limitation Goal Status (Z3086) (None) Other Speech-Language Pathology Functional Limitation Discharge Status [phone]) (None) Tressie Stalker, M.S., CCC-SLP 12/03/2016, 1:18 PM              Ct Head Code Stroke W/o Cm  Result  Date: 12/03/2016 CLINICAL DATA:  Code stroke. Slurred speech and weakness. Last seen normal at 0200 hours. EXAM: CT HEAD WITHOUT CONTRAST TECHNIQUE: Contiguous axial images were obtained from the base of the skull through the vertex without intravenous contrast. COMPARISON:  None. FINDINGS: Mild motion degraded examination. BRAIN: No intraparenchymal hemorrhage, mass effect nor midline shift. The ventricles and sulci are normal. No acute large vascular territory infarcts. No abnormal extra-axial fluid collections. Basal cisterns are patent. VASCULAR: Unremarkable. SKULL/SOFT TISSUES: No skull fracture. No significant soft tissue swelling. ORBITS/SINUSES: The included ocular globes and orbital contents are normal.The mastoid aircells and included paranasal sinuses are well-aerated. Dehiscent RIGHT jugular bulb. OTHER: None. ASPECTS [facility] Stroke Program Early CT Score) - Ganglionic level infarction (caudate, lentiform nuclei, internal capsule, insula, M1-M3 cortex): 7 - Supraganglionic infarction (M4-M6 cortex): 3 Total score (0-10 with 10 being normal): 10 IMPRESSION: 1. Negative mildly motion degraded noncontrast CT HEAD. 2. ASPECTS is 10. 3. Critical Value/emergent results were called by telephone at the time of interpretation on 12/03/2016 at 3:15 am to Dr. Nicholas Lose, Neurology, who verbally acknowledged these results. Electronically Signed   By: Awilda Metro M.D.   On: 12/03/2016 03:17    Lab Data:  CBC:  Recent Labs Lab 12/03/16 0254 12/03/16 0304 12/04/16 0419 12/05/16 1516 12/07/16 0415  WBC 7.4  --  12.5* 8.9 6.7  NEUTROABS 3.4  --   --  5.9  --   HGB 13.3 13.3 13.5 13.5 12.6  HCT 40.5 39.0 41.9 42.1 39.4  MCV 84.7  --  85.2 85.9 84.7  PLT 259  --  307 251 240   Basic Metabolic Panel:  Recent Labs Lab 12/03/16 0254 12/03/16 0304 12/04/16 0419 12/05/16 1516 12/06/16 0903 12/07/16 0415  NA 138 140 138 137 137 135  K 3.5 3.4* 3.8 3.6 3.8 3.8  CL 105 103 112* 107 110 111   CO2 24  --  20* 22 21* 17*  GLUCOSE 138* 135* 198* 88 108* 105*  BUN 12 12 8 17 12 11   CREATININE 0.86 0.90 0.84 0.90 0.81 0.76  CALCIUM 8.5*  --  8.4* 8.1* 8.2* 8.0*  MG  --   --   --  1.8  --   --    GFR: Estimated Creatinine Clearance: 99 mL/min (by C-G formula based on SCr of 0.76 mg/dL). Liver Function Tests:  Recent Labs Lab 12/03/16 0254 12/04/16 0419  AST 34 25  ALT 54 47  ALKPHOS 92 95  BILITOT 0.8 0.7  PROT 6.3* 6.4*  ALBUMIN 2.8* 2.7*   No results for input(s): LIPASE, AMYLASE in the last 168 hours. No results for input(s): AMMONIA in the last 168 hours. Coagulation Profile:  Recent Labs Lab 12/03/16 0254  INR 0.94   Cardiac Enzymes: No results for input(s): CKTOTAL, CKMB, CKMBINDEX, TROPONINI in the last 168 hours. BNP (last 3 results) No results for input(s): PROBNP in the last 8760 hours. HbA1C: No results for input(s): HGBA1C in the last 72 hours. CBG:  Recent Labs Lab 12/03/16 0257  GLUCAP 134*  Lipid Profile: No results for input(s): CHOL, HDL, LDLCALC, TRIG, CHOLHDL, LDLDIRECT in the last 72 hours. Thyroid Function Tests: No results for input(s): TSH, T4TOTAL, FREET4, T3FREE, THYROIDAB in the last 72 hours. Anemia Panel: No results for input(s): VITAMINB12, FOLATE, FERRITIN, TIBC, IRON, RETICCTPCT in the last 72 hours. Urine analysis: No results found for: COLORURINE, APPEARANCEUR, LABSPEC, PHURINE, GLUCOSEU, HGBUR, BILIRUBINUR, KETONESUR, PROTEINUR, UROBILINOGEN, NITRITE, LEUKOCYTESUR   Ripudeep Rai M.D. [facility] 12/07/2016, 1:16 PM  Pager: [phone] Between 7am to 7pm - call Pager - [phone]  After 7pm go to [email or web address] - password [email or web address]  Call night coverage person covering after 7pm

## 2016-12-07 NOTE — [place] FL2 (Signed)
[place] MEDICAID FL2 LEVEL OF CARE SCREENING TOOL     IDENTIFICATION  Patient Name: Shannon Erickson Birthdate: 05/19/1976 Sex: female Admission Date (Current Location): 12/03/2016  [facility] [place]   Facility and Address:  [facility] [place], [address], [place], [place] [address]      Provider Number: [id number]  Attending Physician Name and Address:  Cathren Harshai, Ripudeep K, MD  Relative Name and Phone Number:       Current Level of Care: Hospital Recommended Level of Care: Skilled Nursing Facility Prior Approval Number:    Date Approved/Denied:   PASRR Number:    Discharge Plan: SNF    Current Diagnoses: Patient Active Problem List   Diagnosis Date Noted  . Pain   . Prediabetes   . Dysphagia, post-stroke   . Carotid atherosclerosis, bilateral   . Thyroid nodule   . Hyperlipidemia   . Chronic migraine without aura without status migrainosus, not intractable   . Headache 12/03/2016  . Hypokalemia 12/03/2016  . 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 Left-sided weakness     Orientation RESPIRATION BLADDER Height & Weight     Self, Time, Situation, Place  Normal Continent Weight: 196 lb 6.9 oz (89.1 kg) Height:  5\' 3"  (160 cm)  BEHAVIORAL SYMPTOMS/MOOD NEUROLOGICAL BOWEL NUTRITION STATUS      Continent Diet  AMBULATORY STATUS COMMUNICATION OF NEEDS Skin   Extensive Assist Verbally Normal                       Personal Care Assistance Level of Assistance  Bathing, Dressing, Feeding Bathing Assistance: Maximum assistance Feeding assistance: Limited assistance Dressing Assistance: Maximum assistance     Functional Limitations Info             SPECIAL CARE FACTORS FREQUENCY  PT (By licensed PT), OT (By licensed OT)     PT Frequency: 5/wk OT Frequency: 5/wk            Contractures      Additional Factors Info  Code Status, Allergies Code Status Info: FULL Allergies Info: Penicillins            Current Medications (12/07/2016):  This is the current hospital active medication list Current Facility-Administered Medications  Medication Dose Route Frequency Provider Last Rate Last Dose  . 0.9 %  sodium chloride infusion   Intravenous Continuous Rodolph Bonghompson, Daniel V, MD 125 mL/hr at 12/07/16 [phone]    . aspirin tablet 325 mg  325 mg Oral Daily Marvel PlanXu, Jindong, MD   325 mg at 12/07/16 1000  . atorvastatin (LIPITOR) tablet 80 mg  80 mg Oral q1800 Marvel PlanXu, Jindong, MD   80 mg at 12/06/16 1714  . clopidogrel (PLAVIX) tablet 75 mg  75 mg Oral Daily Marvel PlanXu, Jindong, MD   75 mg at 12/07/16 1000  . cyanocobalamin ((VITAMIN B-12)) injection 1,000 mcg  1,000 mcg Intramuscular Daily Rodolph Bonghompson, Daniel V, MD   1,000 mcg at 12/07/16 1000  . enoxaparin (LOVENOX) injection 40 mg  40 mg Subcutaneous Q24H Pearson GrippeKim, James, MD   40 mg at 12/06/16 1713  . ketorolac (TORADOL) 30 MG/ML injection 30 mg  30 mg Intravenous Q6H PRN Weston SettleEshraghi, Shervin, MD   30 mg at 12/07/16 0649  . RESOURCE THICKENUP CLEAR   Oral PRN Rodolph Bonghompson, Daniel V, MD      . sodium chloride flush (NS) 0.9 % injection 3 mL  3 mL Intravenous Q12H Pearson GrippeKim, James, MD   3 mL  at 12/07/16 1000  . topiramate (TOPAMAX) tablet 50 mg  50 mg Oral Daily Weston Settle, MD   50 mg at 12/07/16 1000     Discharge Medications: Please see discharge summary for a list of discharge medications.  Relevant Imaging Results:  Relevant Lab Results:   Additional Information SS#: [id number]  Burna Sis, LCSW

## 2016-12-07 NOTE — Progress Notes (Signed)
  Speech Language Pathology Treatment: Dysphagia  Patient Details Name: Shannon DiceCynthia Lamoureaux MRN: [id number] DOB: 07/26/1975 Today's Date: 12/07/2016 Time: [id number] SLP Time Calculation (min) (ACUTE ONLY): 44 min  Assessment / Plan / Recommendation Clinical Impression  Pt demonstrating improved phonatory and cough strength!  Of note, family reports pt's speech and voice improve when she is laying on her left side - ? Due to diaphragm strength.   Pt with improved intake - consuming 100% of meal yesterday!  Observed her with thin via straw with chin tuck posture and head neutral to determine readiness for dietary advancement.  No indication of airway compromise with chin tuck posture but weak cough x1/3 with head neutral position. - concerning for penetration/aspiraiton.    Daughter in law reports pt consumed grapes this am and "got tired" chewing them - suspect due to mastication deficits with skin of grape.    Recommend continue dys3/nectar diet to accommodate dysphagia.  Advised grapes ok if skin is removed. THIN water between meals with chin tuck posture ok.  RN informed.  Pt read swallow precaution signs with moderate cues to attend fully to left side using finger sweep.    Family and pt agreeable to plan as pt demonstrating improvements!       HPI HPI: 41 y.o. female w hx of migraines was at home around 11pm c/o headache and then had altered mental status.  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  Pt seen for follow up for dysphagia management and SLE ordered.         SLP Plan  Continue with current plan of care  Patient needs continued Speech Lanaguage Pathology Services    Recommendations  Diet recommendations: Dysphagia 3 (mechanical soft);Nectar-thick liquid (pt may have thin water between meals with chin tuck posture and use of straw, thick liquid with all other intake) Liquids provided via: Straw;Cup Medication Administration:  Whole meds with puree Supervision: Patient able to self feed;Full supervision/cueing for compensatory strategies Compensations: Small sips/bites;Lingual sweep for clearance of pocketing;Slow rate;Monitor for anterior loss;Chin tuck (chin tuck with thin water) Postural Changes and/or Swallow Maneuvers: Seated upright 90 degrees;Upright 30-60 min after meal                Oral Care Recommendations: Oral care QID Follow up Recommendations: Inpatient Rehab SLP Visit Diagnosis: Dysphagia, oropharyngeal phase (R13.12) Plan: Continue with current plan of care       GO              Donavan Burnetamara Iyari Hagner, MS [facility] SLP [phone]   Chales AbrahamsKimball, Octaviano Mukai Ann 12/07/2016, 11:16 AM

## 2016-12-07 NOTE — Progress Notes (Signed)
STROKE TEAM PROGRESS NOTE   SUBJECTIVE (INTERVAL HISTORY) Her father and son and daughter in law are at the bedside.  She is more awake alert. No acute neuro changes or event overnight.      OBJECTIVE Temp:  [97.6 F (36.4 C)-98 F (36.7 C)] 97.9 F (36.6 C) (07/18 1451) Pulse Rate:  [69-73] 72 (07/18 1451) Resp:  [18-20] 18 (07/18 1451) BP: (119-161)/(45-94) 131/79 (07/18 1451) SpO2:  [98 %-100 %] 99 % (07/18 1451) Weight:  [196 lb 6.9 oz (89.1 kg)] 196 lb 6.9 oz (89.1 kg) (07/18 0343)  CBC:   Recent Labs Lab 12/03/16 0254  12/05/16 1516 12/07/16 0415  WBC 7.4  < > 8.9 6.7  NEUTROABS 3.4  --  5.9  --   HGB 13.3  < > 13.5 12.6  HCT 40.5  < > 42.1 39.4  MCV 84.7  < > 85.9 84.7  PLT 259  < > 251 240  < > = values in this interval not displayed.  Basic Metabolic Panel:   Recent Labs Lab 12/05/16 1516 12/06/16 0903 12/07/16 0415  NA 137 137 135  K 3.6 3.8 3.8  CL 107 110 111  CO2 22 21* 17*  GLUCOSE 88 108* 105*  BUN 17 12 11   CREATININE 0.90 0.81 0.76  CALCIUM 8.1* 8.2* 8.0*  MG 1.8  --   --     Lipid Panel:     Component Value Date/Time   CHOL 193 12/04/2016 0419   TRIG 70 12/04/2016 0419   HDL 41 12/04/2016 0419   CHOLHDL 4.7 12/04/2016 0419   VLDL 14 12/04/2016 0419   LDLCALC 138 (H) 12/04/2016 0419   HgbA1c:  Lab Results  Component Value Date   HGBA1C 6.2 (H) 12/04/2016   Urine Drug Screen: No results found for: LABOPIA, COCAINSCRNUR, LABBENZ, AMPHETMU, THCU, LABBARB  Alcohol Level No results found for: ETH  IMAGING I have personally reviewed the radiological images below and agree with the radiology interpretations.  Ct Angio Head W Or Wo Contrast Ct Angio Neck W Or Wo Contrast 12/03/2016 1. Proximal right M1 conclusion.  2. Expected evolution of right temporal lobe, insular cortex, and basal ganglia nonhemorrhagic infarction.  3. Focal irregularity at the right carotid bifurcation with a shelf-like soft tissue plaque but no significant  stenosis relative to the more distal vessel.  4. More mild atherosclerotic changes at the left carotid bifurcation.  5. Fetal type posterior cerebral artery is bilaterally.  6. 11 mm thyroid nodule. Consider further evaluation with thyroid ultrasound. If patient is clinically hyperthyroid, consider nuclear medicine thyroid uptake and scan. Thyroid ultrasound can be done following discharge.   Mr Brain Wo Contrast 12/03/2016 Motion degraded exam.  Large RIGHT MCA territory nonhemorrhagic infarct as described.  There is early mass effect RIGHT-to-LEFT of 1-2 mm. Close follow-up recommended.  Within limits for detection on this motion degraded exam, there is no gross hemorrhage, although small areas could be overlooked. Consider CT follow-up. Suspected RIGHT MCA M1 stenosis or occlusion. This could be further assessed with CT angiography of the head and neck, but only after the patient is more cooperative.      Ct Head Code Stroke W/o Cm 12/03/2016 1. Negative mildly motion degraded noncontrast CT HEAD.  2. ASPECTS is 10.  LE venous Doppler pending 12/05/2016 Mild technical difficulty due to size of the veins.   - No evidence of deep vein or superficial thrombosis involving the right lower extremity and left lower extremity  TTE pending  Left ventricle: The cavity size was normal. Systolic function was normal. The estimated ejection fraction was in the range of 60% to 65%. Wall motion was normal; there were no regional wall motion abnormalities. Left ventricular diastolic function parameters were normal. - Mitral valve: There was trivial regurgitation. - Pulmonic valve: There was no regurgitation.  TCD bubble study - no PFO at rest  TCD emboli detection - negative  Ct Head Wo Contrast 12/06/2016 IMPRESSION: 1. Evolving acute large RIGHT MCA territory infarct without hemorrhagic conversion. 2. Increasing 5 mm RIGHT to LEFT midline shift without ventricular entrapment. 3. Dense RIGHT MCA  consistent with thromboembolism.   TEE Left Ventrical:  Low normal LV function.  EF 50% Mitral Valve: normal  Aortic Valve: normal  Tricuspid Valve: normal  Pulmonic Valve: normal  Left Atrium/ Left atrial appendage: no thrombi  Atrial septum: no PFO or ASD by color flow or Doppler  Aorta: normal   EEG  1.  Focal structural or other physiologic abnormality in the right hemisphere,  Consistent with location of stroke. 2.  diffuse cerebral dysfunction that is non-specific in etiology and can be seen with hypoxic/ischemic injury, toxic/metabolic encephalopathies, neurodegenerative disorders, or medication effect.     PHYSICAL EXAM  Temp:  [97.6 F (36.4 C)-98 F (36.7 C)] 97.9 F (36.6 C) (07/18 1451) Pulse Rate:  [69-73] 72 (07/18 1451) Resp:  [18-20] 18 (07/18 1451) BP: (119-161)/(45-94) 131/79 (07/18 1451) SpO2:  [98 %-100 %] 99 % (07/18 1451) Weight:  [196 lb 6.9 oz (89.1 kg)] 196 lb 6.9 oz (89.1 kg) (07/18 0343)  General - Well nourished, well developed, lethargic.  Ophthalmologic - Fundi not visualized due to noncooperation.  Cardiovascular - Regular rate and rhythm.  Mental Status -  Level of arousal and orientation to time, place, and person were intact. Language including expression, naming, repetition, comprehension was assessed and found intact. Fund of Knowledge was assessed and was intact.  Cranial Nerves II - XII - II - Visual field intact OU. III, IV, VI - Extraocular movements intact. V - Facial sensation intact bilaterally. VII - left lower facial weakness. VIII - Hearing & vestibular intact bilaterally. X - Palate elevates symmetrically, mild dysarthria. XI - Chin turning & shoulder shrug intact bilaterally. XII - Tongue protrusion to the left.  Motor Strength - The patient's strength was normal in RUE and RLE, however LUE 0/5, LLE proximal 2/5 and knee extension 3/5.  Bulk was normal and fasciculations were absent.   Motor Tone - Muscle tone was  assessed at the neck and appendages and was increase left UE and LE.  Reflexes - The patient's reflexes were 1+ in all extremities and she had no pathological reflexes.  Sensory - Light touch, temperature/pinprick were assessed and were symmetrical.    Coordination - The patient had normal movements in the right hand with no ataxia or dysmetria.  Tremor was absent.  Gait and Station - not tested.   ASSESSMENT/PLAN Ms. Arlyce DiceCynthia Gelinas is a 41 y.o. female with history of migraine headaches presenting with left-sided weakness. 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  Resultant  left facial droop and left hemiparesis   CT head - negative  MRI head - Large RIGHT MCA territory infarct with  MRA head - Suspected RIGHT MCA M1 stenosis or occlusion.   CTA H&N - Proximal right M1 conclusion. Right ICA bifurcation soft plaque. Mild  left ICA bifurcation soft plaque.  TCD emboli detection - unremarkable   TCDs with bubbles - no PFO at rest  2D Echo: EF 60-65%. No source of embolus  TEE unremarkable  LE venous duplex - no DVT  LDL - 138  HgbA1c - 6.2  Hypercoagulable workup negative so far (pending phospholipid antibodies)  VTE prophylaxis - Lovenox DIET DYS 3 Room service appropriate? Yes with Assist; Fluid consistency: Nectar Thick  No antithrombotic prior to admission, now on aspirin 325 mg daily. Recommend DAPT for 3 months and then plavix alone.  Patient counseled to be compliant with her antithrombotic medications  Ongoing aggressive stroke risk factor management  Therapy recommendations: CIR  Disposition: Pending  Cerebral edema  CT head 12/03/16 - midline shift 2mm  CT head repeat 12/05/16 - midline shift 5mm  Repeat CT in am  Pt neuro stable but drowsy sleepy  No need for hypertonic saline this time  Continue to monitor  Right ICA bifurcation soft  plaque - questionable ulceration or intraplaque hemorrhage  Not clear etiology  Could be the source of the stroke  Reviewed with Dr. Chestine Spore from Radiology  Hypertension  Stable Permissive hypertension (OK if < 220/120) but gradually normalize in 5-7 days Long-term BP goal normotensive The Hyperlipidemia  Home meds: No lipid lowering medications prior to admission  LDL 138, goal < 70  Now on Lipitor 80 mg daily  Continue statin at discharge  Other Stroke Risk Factors  Obesity, Body mass index is 34.8 kg/m., recommend weight loss, diet and exercise as appropriate   Migraines - chronic, 2 per months, lasting all day, using Excedrin for abortive therapy - now on topamax   Other Active Problems  Mild leukocytosis  Thyroid nodule - follow-up recommended  History of meningitis 17 years ago without residual deficit  Patient works as Development worker, [employer]  Marvel Plan, MD PhD Stroke Neurology 12/07/2016 2:55 PM    To contact Stroke Continuity provider, please refer to [email or web address]. After hours, contact General Neurology

## 2016-12-07 NOTE — Progress Notes (Signed)
Rehab admissions - I met with patient, dad, son and dtr-in-law at the bedside.  Patient was working but has no insurance.  She is interested in inpatient rehab.  Family plans to provide care after potential inpatient rehab stay.  Will await medical readiness prior to inpatient rehab admission.  Call me for questions.  #[phone]

## 2016-12-08 ENCOUNTER — Inpatient Hospital Stay (HOSPITAL_COMMUNITY): Payer: Medicaid Other

## 2016-12-08 DIAGNOSIS — D6861 Antiphospholipid syndrome: Secondary | ICD-10-CM

## 2016-12-08 MED ORDER — ACETAMINOPHEN 325 MG PO TABS
650.0000 mg | ORAL_TABLET | Freq: Four times a day (QID) | ORAL | Status: DC | PRN
  Administered 2016-12-09 – 2016-12-10 (×2): 650 mg via ORAL
  Filled 2016-12-08 (×2): qty 2

## 2016-12-08 MED ORDER — MORPHINE SULFATE (PF) 2 MG/ML IV SOLN
0.5000 mg | INTRAVENOUS | Status: DC | PRN
  Filled 2016-12-08: qty 1

## 2016-12-08 MED ORDER — KETOROLAC TROMETHAMINE 15 MG/ML IJ SOLN
15.0000 mg | Freq: Four times a day (QID) | INTRAMUSCULAR | Status: DC | PRN
  Administered 2016-12-09: 15 mg via INTRAVENOUS
  Filled 2016-12-08: qty 1

## 2016-12-08 NOTE — Clinical Social Work Note (Signed)
Clinical Social Work Assessment  Patient Details  Name: Shannon Erickson MRN: [id number] Date of Birth: 03-13-1976  Date of referral:  12/08/16               Reason for consult:  Facility Placement, Discharge Planning                Permission sought to share information with:  Customer service manager, Family Supports Permission granted to share information::  Yes, Verbal Permission Granted  Name::     [place], Marianna Fuss, [place], Kasandra Knudsen, [name]  Agency::  SNF  Relationship::  Family  Contact Information:     Housing/Transportation Living arrangements for the past 2 months:  Single Family Home Source of Information:  Patient, Parent, Adult Children, Other (Comment Required) (Dependent children) Patient Interpreter Needed:  None Criminal Activity/Legal Involvement Pertinent to Current Situation/Hospitalization:  No - Comment as needed Significant Relationships:  Adult Children, Dependent Children, Warehouse manager, Parents, Friend Lives with:  Self, Adult Children, Other (Comment) (Dependent children) Do you feel safe going back to the place where you live?  Yes Need for family participation in patient care:  No (Coment)  Care giving concerns:  Patient requires short term rehab prior to returning home in order to successfully complete ADLs, and has no insurance.   Social Worker assessment / plan:  CSW met with patient and patient's family at bedside Jeannine Boga, Karina-daughter, [place], [name]-daughter-in-law, Danny-father) to discuss SNF placement and concerns. CSW explained lack of insurance, and the need to determine Medicaid eligibility and apply for Medicaid in order to pursue LOG for SNF placement. Patient's family indicated that no one has started the [facility] application yet, and they would like to be able to start that. CSW alerted financial counselor to come meet with family. CSW completed SNF referral, and will obtain LOG approval if Medicaid eligibility is  determined.  Employment status:  [employer] information:  Self Pay (Medicaid Pending) PT Recommendations:  Inpatient Rehab Consult Information / Referral to community resources:  [place]  Patient/Family's Response to care:  Patient agreeable to SNF placement.  Patient/Family's Understanding of and Emotional Response to Diagnosis, Current Treatment, and Prognosis:  Patient cried during discussion, and family acknowledged feeling overwhelmed with the current situation that they're dealing with at this time. Patient had been working prior to hospitalization and taking care of her kids, and family is hopeful that rehab placement will allow her to return to her previous level of functioning. Patient and family indicated understanding of CSW role in discharge planning.  Emotional Assessment Appearance:  Appears stated age Attitude/Demeanor/Rapport:  Crying Affect (typically observed):  Appropriate Orientation:  Oriented to Self, Oriented to Place, Oriented to  Time, Oriented to Situation Alcohol / Substance use:  Not Applicable Psych involvement (Current and /or in the community):  No (Comment)  Discharge Needs  Concerns to be addressed:  Care Coordination, Discharge Planning Concerns Readmission within the last 30 days:  No Current discharge risk:  Physical Impairment Barriers to Discharge:  Continued Medical Work up, Inadequate or no insurance   [place], [place] 12/08/2016, 5:20 PM

## 2016-12-08 NOTE — Progress Notes (Signed)
[facility]                                                                              Patient Demographics  Shannon Erickson, is a 41 y.o. female, DOB - 1975/06/18, ZOX:[id number]  Admit date - 12/03/2016   Admitting Physician Pearson Grippe, MD  Outpatient Primary MD for the patient is Patient, No Pcp Per  Outpatient specialists:   LOS - 5  days   Medical records reviewed and are as summarized below:    Chief Complaint  Patient presents with  . Code Stroke       Brief summary   Patient is a 41 year old female with history of migraines presented with severe headache and altered mental status. Patient presented to ED and had further complaints of left-sided jerking and weakness. MRI revealed multiple embolic strokes.   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 MRA with suspected right MCA M1 stenosis or occlusion - Patient presented with left facial droop and left hemiparesis - CT angiogram head and neck with the proximal right M1 occlusion - 2-D echo with EF of 60-65% with no source of emboli - TEE unremarkable negative for PFO or ASO or endocarditis - Lower extremity Dopplers negative for DVT - Hypercoagulability workup: Anti-dsDNA antibody negative, anticardiolipin IgM slightly elevated 13 (12 cutoff), beta 2 glycoprotein IgG elevated 109, protein C,S stable - Lipid panel showed LDL of 138 - Endocarditis ruled out, patient placed on aspirin 325 mg daily, Plavix, statin - TCD's showed no PFO, no MES. CTA neck concerning for right ICA soft plaque as well as left ICA with mild soft plaque - EEG showed right hemispheric slowing, diffuse slowing of the waking background - CT head with evolving acute large right MCA territory infarct without hemorrhagic conversion increasing 5 mm right-to-left  midline shift. Discussed with Dr. Roda Shutters, repeat CT head today showed stable shift   Principal problems   right ICA bifurcation plaque like structure -Per neurology   Hypertension -Continue gentle hydration  Hyperlipidemia LDL 138, continue Lipitor  Goal less than 70 LDL    Chronic migraine without aura without status migrainosus, not intractable - Currently stable, continue Topamax     Thyroid nodule - TSH within normal limits, will need thyroid ultrasound on follow-up in the outpatient setting     Dysphagia, post-stroke - Continue dysphagia 3 diet nectar thick   Code Status: full  DVT Prophylaxis:  Lovenox Family Communication: Discussed in detail with the patient, all imaging results, lab results explained to the patient, daughter and father at the bedside   Disposition Plan:CIR when bed available  Time spent in minutes   25 minutes  Procedures:  MRI, MRA, TCD,  2-D echo: EF of 60-65% with no source of emboli Carotid Dopplers Transcranial Doppler bubble study completed. Dr. Roda Shutters performed. No apparent PFO at rest.  TEE normal  Consultants:   Neurology  Antimicrobials:      Medications  Scheduled Meds: . aspirin  325  mg Oral Daily  . atorvastatin  80 mg Oral q1800  . clopidogrel  75 mg Oral Daily  . cyanocobalamin  1,000 mcg Intramuscular Daily  . enoxaparin (LOVENOX) injection  40 mg Subcutaneous Q24H  . sodium chloride flush  3 mL Intravenous Q12H  . topiramate  50 mg Oral Daily   Continuous Infusions: . sodium chloride 125 mL/hr at 12/07/16 1812   PRN Meds:.acetaminophen, ketorolac, RESOURCE THICKENUP CLEAR   Antibiotics   Anti-infectives    None        Subjective:   Shannon Erickson was seen and examined today.Feeling somewhat better, much more alert and awake, oriented today, family at the bedside. Left arm weakness still persisting. Feeling a little more strength in the left leg..  Patient denies dizziness, chest pain, shortness of  breath, abdominal pain, N/V/D/C.  Objective:   Vitals:   12/08/16 0117 12/08/16 0418 12/08/16 0547 12/08/16 1055  BP: (!) 99/48  (!) 113/51 111/64  Pulse: 62  74 85  Resp: 18  20 18   Temp: 98.4 F (36.9 C)  98.5 F (36.9 C) 97.8 F (36.6 C)  TempSrc: Oral  Oral Oral  SpO2: 98%  97% 99%  Weight:  89.5 kg (197 lb 5 oz)    Height:        Intake/Output Summary (Last 24 hours) at 12/08/16 1344 Last data filed at 12/07/16 2144  Gross per 24 hour  Intake              120 ml  Output                0 ml  Net              120 ml     Wt Readings from Last 3 Encounters:  12/08/16 89.5 kg (197 lb 5 oz)     Exam Physical Exam General: Alert and oriented x 3, NAD Eyes: PERRLA, EOMI, Anicteric Sclera, HEENT:  Atraumatic, normocephalic, normal oropharynx Cardiovascular: S1 S2 auscultated, no rubs, murmurs or gallops. Regular rate and rhythm. No pedal edema b/l Respiratory: Clear to auscultation bilaterally, no wheezing, rales or rhonchi Gastrointestinal: Soft, nontender, nondistended, + bowel sounds Ext: no pedal edema bilaterally Neuro: right upper and lower ext 5/5, left lower extremity 2/5, LUE 0/5 Musculoskeletal: No digital cyanosis, clubbing Skin: No rashes Psych: Normal affect and demeanor, alert and oriented x3      Data Reviewed:  I have personally reviewed following labs and imaging studies  Micro Results No results found for this or any previous visit (from the past 240 hour(s)).  Radiology Reports Ct Angio Head W Or Wo Contrast  Result Date: 12/03/2016 CLINICAL DATA:  Right MCA territory infarct.  Migraine headaches. EXAM: CT ANGIOGRAPHY HEAD AND NECK TECHNIQUE: Multidetector CT imaging of the head and neck was performed using the standard protocol during bolus administration of intravenous contrast. Multiplanar CT image reconstructions and MIPs were obtained to evaluate the vascular anatomy. Carotid stenosis measurements (when applicable) are obtained utilizing  NASCET criteria, using the distal internal carotid diameter as the denominator. CONTRAST:  50 mL Isovue 370 was administered into the right antecubital fossa with contrast extravasation. Contrast extravasation consultation: Type of contrast: Isovue 370 site of extravasation: Right antecubital fossa Estimated volume of extravasation: <50 ml Area of extravasation scanned with CT? no PATIENT'S SIGNS AND SYMPTOMS Skin blistering/ulceration: no Decrease capillary refill: no Change in skin color: no Decreased motor function or severe tightness: Unable to assess due to stroke Decreased pulses distal  to site of extravasation: no Altered sensation: Unable to assess due to stroke Increasing pain or signs of increased swelling during observation: no TREATMENT Limb elevation: yes Ice packs applied: yes Heat pads applied: yes Plastic surgery consulted? no DOCUMENTATION AND FOLLOW-UP Site contrast extravasation forms submitted? yes Post extravasation orders completed? yes Was additional follow up assigned to PA's? Yes COMPARISON:  MRI brain from the same day at 11:55 a.m. CT head without contrast at 3:08 a.m. FINDINGS: CT HEAD FINDINGS Brain: Hypoattenuation involving the right temporal lobe, insular cortex, and basal ganglia matches the diffusion abnormality. There is effacement of the sulci. Midline shift is 2 mm. There is no hemorrhage. A hyperdense right MCA is noted. No new infarcts are present. Vascular: Hyperdense right MCA. Skull: The calvarium is intact. No focal lytic or blastic lesions are present. Sinuses: Minimal mucosal thickening is present in the left maxillary sinus. The remaining paranasal sinuses and the mastoid air cells are clear. Orbits: The globes and orbits are within normal limits. Review of the MIP images confirms the above findings CTA NECK FINDINGS Aortic arch: There is a common origin of the left common carotid artery and the innominate artery. There is no focal stenosis of the great vessel origins at  the aorta. No significant vascular calcifications are present at the aorta. Right carotid system: The right common carotid artery is within normal limits. Soft tissue plaque is noted along the posterior proximal right internal carotid artery at the bifurcation. There is a shelf-like plaque. The lumen is narrowed to 3 mm maximally. The more distal right internal carotid artery is within normal limits. Left carotid system: The left common carotid artery is within normal limits. Atherosclerotic changes are noted at the bifurcation. The cervical left internal carotid artery is normal. Vertebral arteries: The vertebral arteries both originate from the subclavian arteries without significant stenoses. Both vertebral arteries are small without significant focal stenosis in the neck. Skeleton: Age advanced degenerative changes are noted at C5-6 and C6-7. Left greater than right uncovertebral spurring is present at C4-5. Vertebral body heights are maintained. No focal lytic or blastic lesions are present. Other neck: 11 mm inferior thyroid nodule is present on the left. No other discrete thyroid lesions are present. There is no significant adenopathy. No focal mucosal lesions are evident. The salivary glands are within normal limits. Upper chest: Ground-glass attenuation of the lung apices bilaterally likely reflects edema or atelectasis. No focal nodule or mass lesion is present. Review of the MIP images confirms the above findings CTA HEAD FINDINGS Anterior circulation: Internal carotid artery's are within normal limits through the ICA termini bilaterally. The A1 segments are patent. Anterior communicating artery is patent. There is a proximal occlusion of the right M1 segment. The left M1 segment is within normal limits. The left MCA bifurcation is unremarkable. Left MCA and right ACA branch vessels are within normal limits. Anterior and superior right MCA branch vessels perfuse. Posterior and inferior branch vessels are  occluded. Posterior circulation: The vertebral arteries are small. PICA origins are visualized and is normal. The basilar artery is small, terminating at the superior cerebellar artery is. Fetal type posterior cerebral arteries are present bilaterally. The PCA branch vessels are within normal limits. Venous sinuses: The dural sinuses are patent. The right transverse sinus is dominant. The straight sinus deep cerebral veins are intact. Cortical veins are within normal limits. Anatomic variants: Bilateral fetal type posterior cerebral artery is. Delayed phase: The infarct is again noted. No pathologic enhancement is evident.  Review of the MIP images confirms the above findings IMPRESSION: 1. Proximal right M1 conclusion. 2. Expected evolution of right temporal lobe, insular cortex, and basal ganglia nonhemorrhagic infarction. 3. Focal irregularity at the right carotid bifurcation with a shelf-like soft tissue plaque but no significant stenosis relative to the more distal vessel. 4. More mild atherosclerotic changes at the left carotid bifurcation. 5. Fetal type posterior cerebral artery is bilaterally. 6. 11 mm thyroid nodule. Consider further evaluation with thyroid ultrasound. If patient is clinically hyperthyroid, consider nuclear medicine thyroid uptake and scan. Thyroid ultrasound can be done following discharge. These results were called by telephone at the time of interpretation on 12/03/2016 at 9:00 pm to Dr. Noel Christmas, who verbally acknowledged these results. Electronically Signed   By: Marin Roberts M.D.   On: 12/03/2016 21:15   Ct Head Wo Contrast  Result Date: 12/08/2016 CLINICAL DATA:  Stroke EXAM: CT HEAD WITHOUT CONTRAST TECHNIQUE: Contiguous axial images were obtained from the base of the skull through the vertex without intravenous contrast. COMPARISON:  Head CT 12/06/2016 FINDINGS: Brain: There is cytotoxic edema within the right MCA territory and right basal ganglia, unchanged from  the prior examination. There is no hemorrhagic conversion. There is unchanged mass effect on the right lateral ventricle. No hydrocephalus. Leftward midline shift measures 6 mm at the level of the foramina of [place], unchanged. Basal cisterns are patent. Vascular: No unexpected calcification. Skull: Normal visualized skull base, calvarium and extracranial soft tissues. Sinuses/Orbits: No sinus fluid levels or advanced mucosal thickening. No mastoid effusion. Normal orbits. IMPRESSION: 1. Unchanged appearance of right MCA territory infarct with 6 mm of leftward midline shift. No hemorrhagic conversion. 2. No ventricular entrapment or hydrocephalus. Electronically Signed   By: Deatra Robinson M.D.   On: 12/08/2016 06:09   Ct Head Wo Contrast  Result Date: 12/06/2016 CLINICAL DATA:  Stroke.  History of migraine. EXAM: CT HEAD WITHOUT CONTRAST TECHNIQUE: Contiguous axial images were obtained from the base of the skull through the vertex without intravenous contrast. COMPARISON:  CT HEAD December 03, 2016 FINDINGS: BRAIN: Evolving acute to early subacute RIGHT temporal, frontal, parietal and basal ganglia infarct. Partially effaced RIGHT lateral ventricle without entrapment. 5 mm RIGHT to LEFT midline shift, increased from 3 mm. No intraparenchymal hemorrhage. Basal cisterns are patent. No abnormal extra-axial fluid collections. VASCULAR: Dense RIGHT middle cerebral artery. SKULL/SOFT TISSUES: No skull fracture. No significant soft tissue swelling. ORBITS/SINUSES: The included ocular globes and orbital contents are normal.Trace paranasal sinus mucosal thickening. Dehiscent RIGHT jugular bulb. Under pneumatized bilateral mastoid air cells with trace effusions, no air cell coalescence. OTHER: None. IMPRESSION: 1. Evolving acute large RIGHT MCA territory infarct without hemorrhagic conversion. 2. Increasing 5 mm RIGHT to LEFT midline shift without ventricular entrapment. 3. Dense RIGHT MCA consistent with thromboembolism.  Electronically Signed   By: Awilda Metro M.D.   On: 12/06/2016 06:31   Ct Angio Neck W Or Wo Contrast  Result Date: 12/03/2016 CLINICAL DATA:  Right MCA territory infarct.  Migraine headaches. EXAM: CT ANGIOGRAPHY HEAD AND NECK TECHNIQUE: Multidetector CT imaging of the head and neck was performed using the standard protocol during bolus administration of intravenous contrast. Multiplanar CT image reconstructions and MIPs were obtained to evaluate the vascular anatomy. Carotid stenosis measurements (when applicable) are obtained utilizing NASCET criteria, using the distal internal carotid diameter as the denominator. CONTRAST:  50 mL Isovue 370 was administered into the right antecubital fossa with contrast extravasation. Contrast extravasation consultation: Type of contrast: Isovue 370  site of extravasation: Right antecubital fossa Estimated volume of extravasation: <50 ml Area of extravasation scanned with CT? no PATIENT'S SIGNS AND SYMPTOMS Skin blistering/ulceration: no Decrease capillary refill: no Change in skin color: no Decreased motor function or severe tightness: Unable to assess due to stroke Decreased pulses distal to site of extravasation: no Altered sensation: Unable to assess due to stroke Increasing pain or signs of increased swelling during observation: no TREATMENT Limb elevation: yes Ice packs applied: yes Heat pads applied: yes Plastic surgery consulted? no DOCUMENTATION AND FOLLOW-UP Site contrast extravasation forms submitted? yes Post extravasation orders completed? yes Was additional follow up assigned to PA's? Yes COMPARISON:  MRI brain from the same day at 11:55 a.m. CT head without contrast at 3:08 a.m. FINDINGS: CT HEAD FINDINGS Brain: Hypoattenuation involving the right temporal lobe, insular cortex, and basal ganglia matches the diffusion abnormality. There is effacement of the sulci. Midline shift is 2 mm. There is no hemorrhage. A hyperdense right MCA is noted. No new  infarcts are present. Vascular: Hyperdense right MCA. Skull: The calvarium is intact. No focal lytic or blastic lesions are present. Sinuses: Minimal mucosal thickening is present in the left maxillary sinus. The remaining paranasal sinuses and the mastoid air cells are clear. Orbits: The globes and orbits are within normal limits. Review of the MIP images confirms the above findings CTA NECK FINDINGS Aortic arch: There is a common origin of the left common carotid artery and the innominate artery. There is no focal stenosis of the great vessel origins at the aorta. No significant vascular calcifications are present at the aorta. Right carotid system: The right common carotid artery is within normal limits. Soft tissue plaque is noted along the posterior proximal right internal carotid artery at the bifurcation. There is a shelf-like plaque. The lumen is narrowed to 3 mm maximally. The more distal right internal carotid artery is within normal limits. Left carotid system: The left common carotid artery is within normal limits. Atherosclerotic changes are noted at the bifurcation. The cervical left internal carotid artery is normal. Vertebral arteries: The vertebral arteries both originate from the subclavian arteries without significant stenoses. Both vertebral arteries are small without significant focal stenosis in the neck. Skeleton: Age advanced degenerative changes are noted at C5-6 and C6-7. Left greater than right uncovertebral spurring is present at C4-5. Vertebral body heights are maintained. No focal lytic or blastic lesions are present. Other neck: 11 mm inferior thyroid nodule is present on the left. No other discrete thyroid lesions are present. There is no significant adenopathy. No focal mucosal lesions are evident. The salivary glands are within normal limits. Upper chest: Ground-glass attenuation of the lung apices bilaterally likely reflects edema or atelectasis. No focal nodule or mass lesion is  present. Review of the MIP images confirms the above findings CTA HEAD FINDINGS Anterior circulation: Internal carotid artery's are within normal limits through the ICA termini bilaterally. The A1 segments are patent. Anterior communicating artery is patent. There is a proximal occlusion of the right M1 segment. The left M1 segment is within normal limits. The left MCA bifurcation is unremarkable. Left MCA and right ACA branch vessels are within normal limits. Anterior and superior right MCA branch vessels perfuse. Posterior and inferior branch vessels are occluded. Posterior circulation: The vertebral arteries are small. PICA origins are visualized and is normal. The basilar artery is small, terminating at the superior cerebellar artery is. Fetal type posterior cerebral arteries are present bilaterally. The PCA branch vessels are within  normal limits. Venous sinuses: The dural sinuses are patent. The right transverse sinus is dominant. The straight sinus deep cerebral veins are intact. Cortical veins are within normal limits. Anatomic variants: Bilateral fetal type posterior cerebral artery is. Delayed phase: The infarct is again noted. No pathologic enhancement is evident. Review of the MIP images confirms the above findings IMPRESSION: 1. Proximal right M1 conclusion. 2. Expected evolution of right temporal lobe, insular cortex, and basal ganglia nonhemorrhagic infarction. 3. Focal irregularity at the right carotid bifurcation with a shelf-like soft tissue plaque but no significant stenosis relative to the more distal vessel. 4. More mild atherosclerotic changes at the left carotid bifurcation. 5. Fetal type posterior cerebral artery is bilaterally. 6. 11 mm thyroid nodule. Consider further evaluation with thyroid ultrasound. If patient is clinically hyperthyroid, consider nuclear medicine thyroid uptake and scan. Thyroid ultrasound can be done following discharge. These results were called by telephone at the  time of interpretation on 12/03/2016 at 9:00 pm to Dr. Noel Christmas, who verbally acknowledged these results. Electronically Signed   By: Marin Roberts M.D.   On: 12/03/2016 21:15   Mr Brain Wo Contrast  Result Date: 12/03/2016 CLINICAL DATA:  Slurred speech and LEFT-sided weakness beginning earlier today. EXAM: MRI HEAD WITHOUT CONTRAST TECHNIQUE: Multiplanar, multiecho pulse sequences of the brain and surrounding structures were obtained without intravenous contrast. COMPARISON:  Noncontrast CT head earlier today. FINDINGS: Significant motion degradation. The study is diagnostic for the clinical indication, but small or subtle lesions could be overlooked. Brain: Large area of restricted diffusion, RIGHT MCA territory, involves the temporal lobe, small portion of the frontal lobe, insula, and basal ganglia, as well as the regional white matter. Findings consistent with acute cerebral infarction. This is not visible on prior CT. Early mass effect RIGHT to LEFT of 1-2 mm. Gradient sequence shows significant motion degradation, pre occluding definitive excess but of hemorrhage. No gross lobar hemorrhage. No mass lesion, hydrocephalus, or extra-axial fluid. Vascular: Suspected RIGHT MCA M1 stenosis or occlusion. This is difficult to confirm due to motion degradation. Skull and upper cervical spine: Normal marrow signal. Sinuses/Orbits: Negative. Other: None. IMPRESSION: Motion degraded exam. Large RIGHT MCA territory nonhemorrhagic infarct as described. There is early mass effect RIGHT-to-LEFT of 1-2 mm. Close follow-up recommended. Within limits for detection on this motion degraded exam, there is no gross hemorrhage, although small areas could be overlooked. Consider CT follow-up. Suspected RIGHT MCA M1 stenosis or occlusion. This could be further assessed with CT angiography of the head and neck, but only after the patient is more cooperative. These results were called by telephone at the time of  interpretation on 12/03/2016 at 1:04 pm to Dr. Wilford Corner, who verbally acknowledged these results. Electronically Signed   By: Elsie Stain M.D.   On: 12/03/2016 13:05   Dg Swallowing Func-speech Pathology  Result Date: 12/03/2016 Objective Swallowing Evaluation: Type of Study: MBS-Modified Barium Swallow Study Patient Details Name: Ralyn Stlaurent MRN: [id number] Date of Birth: 09/06/1975 Today's Date: 12/03/2016 Time: SLP Start Time (ACUTE ONLY): 1238-SLP Stop Time (ACUTE ONLY): 1252 SLP Time Calculation (min) (ACUTE ONLY): 14 min Past Medical History: Past Medical History: Diagnosis Date . Migraine  Past Surgical History: Past Surgical History: Procedure Laterality Date . CESAREAN SECTION Bilateral 1995, 2002 HPI: 41 y.o. female w hx of migraines was at home around 11pm c/o headache and then had altered mental status; BSE completed and pt kept NPO d/t overt s/s of aspiration including delayed throat clearing/cough with thin liquids; anterior left  loss; CT negative; MRI pending No Data Recorded Assessment / Plan / Recommendation CHL IP CLINICAL IMPRESSIONS 12/03/2016 Clinical Impression Pt presents with mild-moderate oropharyngeal dysphagia characterized by premature spillage to pyriform sinuses prior to swallow triggering d/t sensory impairment; anterior loss noted with liquids either via tsp and/or cup; chin tuck and head turn left attempted with larger volumes of thin and nectar-thickened liquids, but was inconsistent d/t pt lethargy, subsequently, larger volumes of thin entered laryngeal vestibule with cup sips and to level of the vocal folds with straw sips and ejected out; no penetration and/or aspiration noted with nectar-thickened liquids via cup/straw, but pt is at risk with larger amounts; mechanical/soft consistency attempted, but removed from oral cavity d/t impaired mastication/poor lingual control/manipulation paired with pt lethargy; recommend Dysphagia 1 (puree) with nectar-thickened liquids via  individual straw sips to reduce anterior loss presented on right side pending pt alertness level and increased appetite with swallowing precautions in place during PO intake SLP Visit Diagnosis Dysphagia, oropharyngeal phase (R13.12) Attention and concentration deficit following -- Frontal lobe and executive function deficit following -- Impact on safety and function Moderate aspiration risk   CHL IP TREATMENT RECOMMENDATION 12/03/2016 Treatment Recommendations Therapy as outlined in treatment plan below   Prognosis 12/03/2016 Prognosis for Safe Diet Advancement Good Barriers to Reach Goals -- Barriers/Prognosis Comment -- CHL IP DIET RECOMMENDATION 12/03/2016 SLP Diet Recommendations Dysphagia 1 (Puree) solids;Nectar thick liquid Liquid Administration via Cup;Straw;Other (Comment) Medication Administration Crushed with puree Compensations Slow rate;Small sips/bites;Lingual sweep for clearance of pocketing;Monitor for anterior loss Postural Changes --   CHL IP OTHER RECOMMENDATIONS 12/03/2016 Recommended Consults -- Oral Care Recommendations Oral care BID Other Recommendations Order thickener from pharmacy   CHL IP FOLLOW UP RECOMMENDATIONS 12/03/2016 Follow up Recommendations Other (comment)   CHL IP FREQUENCY AND DURATION 12/03/2016 Speech Therapy Frequency (ACUTE ONLY) min 2x/week Treatment Duration 1 week      CHL IP ORAL PHASE 12/03/2016 Oral Phase Impaired Oral - Pudding Teaspoon -- Oral - Pudding Cup -- Oral - Honey Teaspoon -- Oral - Honey Cup -- Oral - Nectar Teaspoon Left anterior bolus loss;Premature spillage Oral - Nectar Cup -- Oral - Nectar Straw Premature spillage Oral - Thin Teaspoon Left anterior bolus loss;Premature spillage Oral - Thin Cup Left anterior bolus loss;Premature spillage Oral - Thin Straw Premature spillage Oral - Puree Premature spillage Oral - Mech Soft -- Oral - Regular -- Oral - Multi-Consistency -- Oral - Pill -- Oral Phase - Comment (No Data)  CHL IP PHARYNGEAL PHASE 12/03/2016  Pharyngeal Phase Impaired Pharyngeal- Pudding Teaspoon -- Pharyngeal -- Pharyngeal- Pudding Cup -- Pharyngeal -- Pharyngeal- Honey Teaspoon -- Pharyngeal -- Pharyngeal- Honey Cup -- Pharyngeal -- Pharyngeal- Nectar Teaspoon Delayed swallow initiation-pyriform sinuses Pharyngeal Material does not enter airway Pharyngeal- Nectar Cup Delayed swallow initiation-pyriform sinuses Pharyngeal Material does not enter airway Pharyngeal- Nectar Straw Delayed swallow initiation-pyriform sinuses Pharyngeal Material does not enter airway Pharyngeal- Thin Teaspoon Delayed swallow initiation-pyriform sinuses Pharyngeal Material enters airway, remains ABOVE vocal cords then ejected out Pharyngeal- Thin Cup Delayed swallow initiation-pyriform sinuses;Compensatory strategies attempted (chin tuck; head turn left) Pharyngeal Material enters airway, remains ABOVE vocal cords then ejected out Pharyngeal- Thin Straw Delayed swallow initiation-pyriform sinuses;Penetration/Aspiration before swallow;Trace aspiration;Compensatory strategies attempted (chin tuck/head turn left) Pharyngeal Material enters airway, CONTACTS cords and then ejected out Pharyngeal- Puree Delayed swallow initiation-vallecula Pharyngeal -- Pharyngeal- Mechanical Soft NT Pharyngeal -- Pharyngeal- Regular -- Pharyngeal -- Pharyngeal- Multi-consistency -- Pharyngeal -- Pharyngeal- Pill -- Pharyngeal -- Pharyngeal Comment (No Data)  CHL IP CERVICAL ESOPHAGEAL PHASE 12/03/2016 Cervical Esophageal Phase WFL Pudding Teaspoon -- Pudding Cup -- Honey Teaspoon -- Honey Cup -- Nectar Teaspoon -- Nectar Cup -- Nectar Straw -- Thin Teaspoon -- Thin Cup -- Thin Straw -- Puree -- Mechanical Soft -- Regular -- Multi-consistency -- Pill -- Cervical Esophageal Comment -- CHL IP GO 12/03/2016 Functional Assessment Tool Used NOMS; clinical judgment Functional Limitations Swallowing Swallow Current Status (Z6109(G8996) CK Swallow Goal Status (U0454(G8997) CJ Swallow Discharge Status (U9811(G8998) (None) Motor  Speech Current Status (B1478(G8999) (None) Motor Speech Goal Status (G9562(G9186) (None) Motor Speech Goal Status (Z3086(G9158) (None) Spoken Language Comprehension Current Status (V7846(G9159) (None) Spoken Language Comprehension Goal Status (N6295(G9160) (None) Spoken Language Comprehension Discharge Status (M8413(G9161) (None) Spoken Language Expression Current Status (K4401(G9162) (None) Spoken Language Expression Goal Status (U2725(G9163) (None) Spoken Language Expression Discharge Status [phone](G9164) (None) Attention Current Status (I3474(G9165) (None) Attention Goal Status (Q5956(G9166) (None) Attention Discharge Status (L8756(G9167) (None) Memory Current Status (E3329(G9168) (None) Memory Goal Status (J1884(G9169) (None) Memory Discharge Status (Z6606(G9170) (None) Voice Current Status (T0160(G9171) (None) Voice Goal Status (F0932(G9172) (None) Voice Discharge Status (T5573(G9173) (None) Other Speech-Language Pathology Functional Limitation Current Status (U2025(G9174) (None) Other Speech-Language Pathology Functional Limitation Goal Status (K2706(G9175) (None) Other Speech-Language Pathology Functional Limitation Discharge Status [phone]([phone]) (None) Tressie StalkerPat Adams, M.S., CCC-SLP 12/03/2016, 1:18 PM              Ct Head Code Stroke W/o Cm  Result Date: 12/03/2016 CLINICAL DATA:  Code stroke. Slurred speech and weakness. Last seen normal at 0200 hours. EXAM: CT HEAD WITHOUT CONTRAST TECHNIQUE: Contiguous axial images were obtained from the base of the skull through the vertex without intravenous contrast. COMPARISON:  None. FINDINGS: Mild motion degraded examination. BRAIN: No intraparenchymal hemorrhage, mass effect nor midline shift. The ventricles and sulci are normal. No acute large vascular territory infarcts. No abnormal extra-axial fluid collections. Basal cisterns are patent. VASCULAR: Unremarkable. SKULL/SOFT TISSUES: No skull fracture. No significant soft tissue swelling. ORBITS/SINUSES: The included ocular globes and orbital contents are normal.The mastoid aircells and included paranasal sinuses are well-aerated.  Dehiscent RIGHT jugular bulb. OTHER: None. ASPECTS [facility]([place] Stroke Program Early CT Score) - Ganglionic level infarction (caudate, lentiform nuclei, internal capsule, insula, M1-M3 cortex): 7 - Supraganglionic infarction (M4-M6 cortex): 3 Total score (0-10 with 10 being normal): 10 IMPRESSION: 1. Negative mildly motion degraded noncontrast CT HEAD. 2. ASPECTS is 10. 3. Critical Value/emergent results were called by telephone at the time of interpretation on 12/03/2016 at 3:15 am to Dr. Nicholas LoseEshraghi, Neurology, who verbally acknowledged these results. Electronically Signed   By: Awilda Metroourtnay  Bloomer M.D.   On: 12/03/2016 03:17    Lab Data:  CBC:  Recent Labs Lab 12/03/16 0254 12/03/16 0304 12/04/16 0419 12/05/16 1516 12/07/16 0415  WBC 7.4  --  12.5* 8.9 6.7  NEUTROABS 3.4  --   --  5.9  --   HGB 13.3 13.3 13.5 13.5 12.6  HCT 40.5 39.0 41.9 42.1 39.4  MCV 84.7  --  85.2 85.9 84.7  PLT 259  --  307 251 240   Basic Metabolic Panel:  Recent Labs Lab 12/03/16 0254 12/03/16 0304 12/04/16 0419 12/05/16 1516 12/06/16 0903 12/07/16 0415  NA 138 140 138 137 137 135  K 3.5 3.4* 3.8 3.6 3.8 3.8  CL 105 103 112* 107 110 111  CO2 24  --  20* 22 21* 17*  GLUCOSE 138* 135* 198* 88 108* 105*  BUN 12 12 8 17 12 11   CREATININE 0.86 0.90 0.84 0.90  0.81 0.76  CALCIUM 8.5*  --  8.4* 8.1* 8.2* 8.0*  MG  --   --   --  1.8  --   --    GFR: Estimated Creatinine Clearance: 99.2 mL/min (by C-G formula based on SCr of 0.76 mg/dL). Liver Function Tests:  Recent Labs Lab 12/03/16 0254 12/04/16 0419  AST 34 25  ALT 54 47  ALKPHOS 92 95  BILITOT 0.8 0.7  PROT 6.3* 6.4*  ALBUMIN 2.8* 2.7*   No results for input(s): LIPASE, AMYLASE in the last 168 hours. No results for input(s): AMMONIA in the last 168 hours. Coagulation Profile:  Recent Labs Lab 12/03/16 0254  INR 0.94   Cardiac Enzymes: No results for input(s): CKTOTAL, CKMB, CKMBINDEX, TROPONINI in the last 168 hours. BNP (last 3  results) No results for input(s): PROBNP in the last 8760 hours. HbA1C: No results for input(s): HGBA1C in the last 72 hours. CBG:  Recent Labs Lab 12/03/16 0257  GLUCAP 134*   Lipid Profile: No results for input(s): CHOL, HDL, LDLCALC, TRIG, CHOLHDL, LDLDIRECT in the last 72 hours. Thyroid Function Tests: No results for input(s): TSH, T4TOTAL, FREET4, T3FREE, THYROIDAB in the last 72 hours. Anemia Panel: No results for input(s): VITAMINB12, FOLATE, FERRITIN, TIBC, IRON, RETICCTPCT in the last 72 hours. Urine analysis: No results found for: COLORURINE, APPEARANCEUR, LABSPEC, PHURINE, GLUCOSEU, HGBUR, BILIRUBINUR, KETONESUR, PROTEINUR, UROBILINOGEN, NITRITE, LEUKOCYTESUR   Sheng Pritz M.D. [facility] 12/08/2016, 1:44 PM  Pager: [phone] Between 7am to 7pm - call Pager - [phone]  After 7pm go to [email or web address] - password [email or web address]  Call night coverage person covering after 7pm

## 2016-12-08 NOTE — Progress Notes (Signed)
SLP Cancellation Note  Patient Details Name: Shannon DiceCynthia Erickson MRN: [id number] DOB: 05/16/1976   Cancelled treatment:        Pt working with other therapies during time of visit.  Pt reports she is tolerating po well.  Will continue efforts.  Thanks.  Donavan Burnetamara [name], MS [facility] SLP [phone]  Chales AbrahamsKimball, [name] 12/08/2016, 10:01 AM

## 2016-12-08 NOTE — Progress Notes (Signed)
Physical Therapy Treatment Patient Details Name: Shannon DiceCynthia Erickson MRN: [id number] DOB: 11/23/1975 Today's Date: 12/08/2016    History of Present Illness 41 y.o. female w hx of migraines was at home around 11pm c/o headache and then had altered mental status.  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, insular and basal ganglia regions    PT Comments    Pt seen for mobility progression and increasing endurance with therapeutic interventions. Pt very motivated and willing to fully participate. She continues to require heavy physical assistance of two people for transfers at this time.  Pt remains an excellent candidate for further intensive therapy services at CIR prior to returning home. PT will continue to follow acutely.   Follow Up Recommendations  [facility]     Equipment Recommendations  None recommended by PT;Other (comment) (defer to next venue)    Recommendations for Other Services Rehab consult     Precautions / Restrictions Precautions Precautions: Fall Restrictions Weight Bearing Restrictions: No    Mobility  Bed Mobility Overal bed mobility: Needs Assistance Bed Mobility: Supine to Sit     Supine to sit: Mod assist     General bed mobility comments: pt required increased time, verbal and tactile cueing for sequencing, use of bed rails, assist to bring bilateral LEs off of bed and assist to elevate trunk and achieve sitting EOB  Transfers Overall transfer level: Needs assistance Equipment used: 2 person hand held assist Transfers: Sit to/from [employer] CorporationStand;Stand Pivot Transfers Sit to Stand: Mod assist;+2 physical assistance Stand pivot transfers: Max assist;+2 physical assistance       General transfer comment: mod A x2 to rise into standing [facility] and [facility]; max A x2 for pivotal [facility] to [facility]. pt required physical assistance to get L LE positioned appropriately underneath of her in standing. Pt with L lateral lean in  standing.  Ambulation/Gait                 Stairs            Wheelchair Mobility    Modified Rankin (Stroke Patients Only) Modified Rankin (Stroke Patients Only) Pre-Morbid Rankin Score: No symptoms Modified Rankin: Severe disability     Balance Overall balance assessment: Needs assistance Sitting-balance support: Feet supported;Single extremity supported Sitting balance-Leahy Scale: Poor Sitting balance - Comments: pt requiring UE support and min A to maintain sitting EOB Postural control: Left lateral lean Standing balance support: Bilateral upper extremity supported;During functional activity Standing balance-Leahy Scale: Poor Standing balance comment: max A x2 to maintain upright standing                            Cognition Arousal/Alertness: Awake/alert Behavior During Therapy: Flat affect Overall Cognitive Status: Impaired/Different from baseline Area of Impairment: Awareness;Problem solving                           Awareness: Emergent Problem Solving: Requires tactile cues;Requires verbal cues;Slow processing General Comments: pt continues to have L inattention of body and environment. Pt required frequent multimodal cueing to attend to L.      Exercises      General Comments        Pertinent Vitals/Pain Pain Assessment: No/denies pain    Home Living                      Prior Function  PT Goals (current goals can now be found in the care plan section) Acute Rehab PT Goals PT Goal Formulation: With patient/family Time For Goal Achievement: 12/20/16 Potential to Achieve Goals: Good Progress towards PT goals: Progressing toward goals    Frequency    Min 4X/week      PT Plan Current plan remains appropriate    Co-evaluation              AM-PAC PT "6 Clicks" Daily Activity  Outcome Measure  Difficulty turning over in bed (including adjusting bedclothes, sheets and blankets)?:  Total Difficulty moving from lying on back to sitting on the side of the bed? : Total Difficulty sitting down on and standing up from a chair with arms (e.g., wheelchair, bedside commode, etc,.)?: Total Help needed moving to and from a bed to chair (including a wheelchair)?: A Lot Help needed walking in hospital room?: Total Help needed climbing 3-5 steps with a railing? : Total 6 Click Score: 7    End of Session Equipment Utilized During Treatment: Gait belt Activity Tolerance: Patient tolerated treatment well Patient left: in chair;with call bell/phone within reach;with family/visitor present Nurse Communication: Mobility status PT Visit Diagnosis: Difficulty in walking, not elsewhere classified (R26.2);Hemiplegia and hemiparesis Hemiplegia - Right/Left: Left Hemiplegia - dominant/non-dominant: Non-dominant Hemiplegia - caused by: Cerebral infarction     Time: 4540-9811 PT Time Calculation (min) (ACUTE ONLY): 23 min  Charges:  $Therapeutic Activity: 23-37 mins                    G Codes:       [place], PT, DPT [phone]    Alessandra Bevels Almina Schul 12/08/2016, 11:04 AM

## 2016-12-08 NOTE — Progress Notes (Signed)
STROKE TEAM PROGRESS NOTE   SUBJECTIVE (INTERVAL HISTORY) Her daughter is at the bedside.  The patient was awake and alert.  No acute neuro changes or event overnight. Working with PT/OT. Still has left hemiplegia.   OBJECTIVE Temp:  [97.8 F (36.6 C)-98.9 F (37.2 C)] 97.8 F (36.6 C) (07/19 1055) Pulse Rate:  [62-85] 85 (07/19 1055) Resp:  [18-20] 18 (07/19 1055) BP: (99-131)/(48-79) 111/64 (07/19 1055) SpO2:  [97 %-99 %] 99 % (07/19 1055) Weight:  [89.5 kg (197 lb 5 oz)] 89.5 kg (197 lb 5 oz) (07/19 0418)  CBC:   Recent Labs Lab 12/03/16 0254  12/05/16 1516 12/07/16 0415  WBC 7.4  < > 8.9 6.7  NEUTROABS 3.4  --  5.9  --   HGB 13.3  < > 13.5 12.6  HCT 40.5  < > 42.1 39.4  MCV 84.7  < > 85.9 84.7  PLT 259  < > 251 240  < > = values in this interval not displayed.  Basic Metabolic Panel:   Recent Labs Lab 12/05/16 1516 12/06/16 0903 12/07/16 0415  NA 137 137 135  K 3.6 3.8 3.8  CL 107 110 111  CO2 22 21* 17*  GLUCOSE 88 108* 105*  BUN 17 12 11   CREATININE 0.90 0.81 0.76  CALCIUM 8.1* 8.2* 8.0*  MG 1.8  --   --     Lipid Panel:     Component Value Date/Time   CHOL 193 12/04/2016 0419   TRIG 70 12/04/2016 0419   HDL 41 12/04/2016 0419   CHOLHDL 4.7 12/04/2016 0419   VLDL 14 12/04/2016 0419   LDLCALC 138 (H) 12/04/2016 0419   HgbA1c:  Lab Results  Component Value Date   HGBA1C 6.2 (H) 12/04/2016   Urine Drug Screen: No results found for: LABOPIA, COCAINSCRNUR, LABBENZ, AMPHETMU, THCU, LABBARB  Alcohol Level No results found for: ETH  IMAGING I have personally reviewed the radiological images below and agree with the radiology interpretations.  Ct Head Code Stroke W/o Cm 12/03/2016 1. Negative mildly motion degraded noncontrast CT HEAD.  2. ASPECTS is 10.  Ct Angio Head W Or Wo Contrast Ct Angio Neck W Or Wo Contrast 12/03/2016 1. Proximal right M1 conclusion.  2. Expected evolution of right temporal lobe, insular cortex, and basal ganglia  nonhemorrhagic infarction.  3. Focal irregularity at the right carotid bifurcation with a shelf-like soft tissue plaque but no significant stenosis relative to the more distal vessel.  4. More mild atherosclerotic changes at the left carotid bifurcation.  5. Fetal type posterior cerebral artery is bilaterally.  6. 11 mm thyroid nodule. Consider further evaluation with thyroid ultrasound. If patient is clinically hyperthyroid, consider nuclear medicine thyroid uptake and scan. Thyroid ultrasound can be done following discharge.   Mr Brain Wo Contrast 12/03/2016 Motion degraded exam.  Large RIGHT MCA territory nonhemorrhagic infarct as described.  There is early mass effect RIGHT-to-LEFT of 1-2 mm. Close follow-up recommended.  Within limits for detection on this motion degraded exam, there is no gross hemorrhage, although small areas could be overlooked. Consider CT follow-up. Suspected RIGHT MCA M1 stenosis or occlusion. This could be further assessed with CT angiography of the head and neck, but only after the patient is more cooperative.      CT Head  12/06/2016 1. Evolving acute large RIGHT MCA territory infarct without hemorrhagic conversion. 2. Increasing 5 mm RIGHT to LEFT midline shift without ventricular entrapment. 3. Dense RIGHT MCA consistent with thromboembolism.  CT Head  12/08/2016 1. Unchanged appearance of right MCA territory infarct with 6 mm of leftward midline shift. No hemorrhagic conversion. 2. No ventricular entrapment or hydrocephalus.  LE venous Doppler pending 12/05/2016 Mild technical difficulty due to size of the veins.   - No evidence of deep vein or superficial thrombosis involving the right lower extremity and left lower extremity  TCD bubble study - no PFO at rest  TCD emboli detection - negative  Ct Head Wo Contrast 12/06/2016 IMPRESSION: 1. Evolving acute large RIGHT MCA territory infarct without hemorrhagic conversion. 2. Increasing 5 mm RIGHT to LEFT  midline shift without ventricular entrapment. 3. Dense RIGHT MCA consistent with thromboembolism.   TTE - Left ventricle: The cavity size was normal. Systolic function was   normal. The estimated ejection fraction was in the range of 60%   to 65%. Wall motion was normal; there were no regional wall   motion abnormalities. Left ventricular diastolic function   parameters were normal. - Mitral valve: There was trivial regurgitation. - Pulmonic valve: There was no regurgitation.  TEE  Left Ventrical:  Low normal LV function.  EF 50% Mitral Valve: normal  Aortic Valve: normal  Tricuspid Valve: normal  Pulmonic Valve: normal  Left Atrium/ Left atrial appendage: no thrombi  Atrial septum: no PFO or ASD by color flow or Doppler  Aorta: normal   EEG  1.  Focal structural or other physiologic abnormality in the right hemisphere,  Consistent with location of stroke. 2.  diffuse cerebral dysfunction that is non-specific in etiology and can be seen with hypoxic/ischemic injury, toxic/metabolic encephalopathies, neurodegenerative disorders, or medication effect.    Ct Head Wo Contrast 12/08/2016 IMPRESSION: 1. Unchanged appearance of right MCA territory infarct with 6 mm of leftward midline shift. No hemorrhagic conversion. 2. No ventricular entrapment or hydrocephalus. Imaging by my read, no significant change of midline shift from last CT.    PHYSICAL EXAM  Temp:  [97.8 F (36.6 C)-98.9 F (37.2 C)] 97.8 F (36.6 C) (07/19 1055) Pulse Rate:  [62-85] 85 (07/19 1055) Resp:  [18-20] 18 (07/19 1055) BP: (99-131)/(48-79) 111/64 (07/19 1055) SpO2:  [97 %-99 %] 99 % (07/19 1055) Weight:  [89.5 kg (197 lb 5 oz)] 89.5 kg (197 lb 5 oz) (07/19 0418)  General - Well nourished, well developed, lethargic.  Ophthalmologic - Fundi not visualized due to noncooperation.  Cardiovascular - Regular rate and rhythm.  Mental Status -  Level of arousal and orientation to time, place, and person were  intact. Language including expression, naming, repetition, comprehension was assessed and found intact. Fund of Knowledge was assessed and was intact.  Cranial Nerves II - XII - II - Visual field intact OU. III, IV, VI - Extraocular movements intact. V - Facial sensation intact bilaterally. VII - left lower facial weakness. VIII - Hearing & vestibular intact bilaterally. X - Palate elevates symmetrically, mild dysarthria. XI - Chin turning & shoulder shrug intact bilaterally. XII - Tongue protrusion to the left.  Motor Strength - The patient's strength was normal in RUE and RLE, however LUE 0/5, LLE proximal 2/5 and knee extension 3/5.  Bulk was normal and fasciculations were absent.   Motor Tone - Muscle tone was assessed at the neck and appendages and was increase left UE and LE.  Reflexes - The patient's reflexes were 1+ in all extremities and she had no pathological reflexes.  Sensory - Light touch, temperature/pinprick were assessed and were symmetrical.    Coordination - The patient had normal movements  in the right hand with no ataxia or dysmetria.  Tremor was absent.  Gait and Station - not tested.   ASSESSMENT/PLAN [name] is a 41 y.o. female with history of migraine headaches presenting with left-sided weakness. 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 However, due to high Beta-2-glycoprotein IgG, antiphospholipid syndrome cannot be ruled out at this time  Resultant  left facial droop and left hemiparesis   CT head - negative  MRI head - Large RIGHT MCA territory infarct with  MRA head - Suspected RIGHT MCA M1 stenosis or occlusion.   CTA H&N - Proximal right M1 conclusion. Right ICA bifurcation soft plaque. Mild left ICA bifurcation soft plaque.  TCD emboli detection - unremarkable   TCDs with bubbles - no PFO at rest  2D Echo: EF 60-65%. No  source of embolus  TEE unremarkable EF 50%  LE venous duplex - no DVT  LDL - 138  HgbA1c - 6.2  Hypercoagulable workup high Beta-2-glycoprotein IgG at 109  VTE prophylaxis - Lovenox DIET DYS 3 Room service appropriate? Yes with Assist; Fluid consistency: Nectar Thick  No antithrombotic prior to admission, now on aspirin 325 mg daily. Recommend DAPT for 3 months and then plavix alone.  Patient counseled to be compliant with her antithrombotic medications  Ongoing aggressive stroke risk factor management  Therapy recommendations: CIR  Disposition: Pending CIR  Cerebral edema  CT head 12/03/16 - midline shift 2mm  CT head repeat 12/05/16 - midline shift 5mm  Repeat CT 12/08/16 - stable midline shift  Pt neuro stable   No need for hypertonic saline this time  Continue to monitor  Questionable antiphospholipid syndrome  High Beta 2 glycoprotein IgG at 109  Need to repeat about past in 12 weeks at follow-up in clinic  If still high, they consider anticoagulation  Right ICA bifurcation soft plaque - questionable ulceration or intraplaque hemorrhage  Not clear etiology  Could be the source of the stroke  Reviewed with Dr. Chestine Spore from Radiology  Hypertension  Stable   Permissive hypertension (OK if < 220/120) but gradually normalize in 5-7 days  Long-term BP goal normotensive  Hyperlipidemia  Home meds: No lipid lowering medications prior to admission  LDL 138, goal < 70  Now on Lipitor 80 mg daily  Continue statin at discharge  Other Stroke Risk Factors  Obesity, Body mass index is 34.95 kg/m., recommend weight loss, diet and exercise as appropriate   Migraines - chronic, 2 per months, lasting all day, using Excedrin for abortive therapy - now on topamax   Other Active Problems  Mild leukocytosis  Thyroid nodule - follow-up recommended  History of meningitis 17 years ago without residual deficit  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 Please call with questions. Pt will follow up with Dr. Roda Shutters at [facility] in about 6 weeks. Thanks for the consult.   Marvel Plan, MD PhD Stroke Neurology 12/08/2016 1:21 PM    To contact Stroke Continuity provider, please refer to [email or web address]. After hours, contact General Neurology

## 2016-12-09 ENCOUNTER — Encounter (HOSPITAL_COMMUNITY): Payer: Self-pay | Admitting: Physical Medicine and Rehabilitation

## 2016-12-09 DIAGNOSIS — D6861 Antiphospholipid syndrome: Secondary | ICD-10-CM

## 2016-12-09 LAB — LUPUS ANTICOAGULANT PANEL
DRVVT: 40.8 s (ref 0.0–47.0)
PTT LA: 33.4 s (ref 0.0–51.9)

## 2016-12-09 MED ORDER — STROKE: EARLY STAGES OF RECOVERY BOOK
Freq: Once | Status: AC
  Administered 2016-12-09: 09:00:00
  Filled 2016-12-09: qty 1

## 2016-12-09 NOTE — Care Management Note (Addendum)
Case Management Note  Patient Details  Name: Shannon Erickson MRN: [id number] Date of Birth: 12/14/1975  Subjective/Objective: presents with Right MCA.                   Action/Plan: DC to CIR on 7/21.   Expected Discharge Date:  12/05/16               Expected Discharge Plan:  IP Rehab Facility  In-House Referral:  Clinical Social Work  Discharge planning Services  CM Consult  Post Acute Care Choice:    Choice offered to:     DME Arranged:    DME Agency:     HH Arranged:    HH Agency:     Status of Service:  In process, will continue to follow  If discussed at Long Length of Stay Meetings, dates discussed:    Additional Comments:  Leone Havenaylor, Yesennia Hirota Clinton, RN 12/09/2016, 3:15 PM

## 2016-12-09 NOTE — Progress Notes (Signed)
Physical Therapy Treatment Patient Details Name: Shannon DiceCynthia Bierlein MRN: [id number] DOB: 12/17/1975 Today's Date: 12/09/2016    History of Present Illness 41 y.o. female w hx of migraines was at home around 11pm c/o headache and then had altered mental status.  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 She demonstrated improved sitting balance this session and required no physical assistant to sit EOB. Pt also improving with bed mobility and was able to elevate her trunk with R UE without physical assistance. She continues to require physical assistance of two for transfers. PT remains an excellent candidate for CIR for further intensive therapy services. PT will continue to follow acutely.   Follow Up Recommendations  CIR     Equipment Recommendations  None recommended by PT;Other (comment) (defer to next venue)    Recommendations for Other Services Rehab consult     Precautions / Restrictions Precautions Precautions: Fall Restrictions Weight Bearing Restrictions: No    Mobility  Bed Mobility Overal bed mobility: Needs Assistance Bed Mobility: Rolling;Sidelying to Sit Rolling: Min assist Sidelying to sit: Min assist       General bed mobility comments: pt required increased time, verbal and tactile cueing for sequencing, use of bed rails, assist to bring bilateral LEs off of bed. Pt able to use R UE to elevate trunk and achieve sitting EOB  Transfers Overall transfer level: Needs assistance Equipment used: 2 person hand held assist Transfers: Sit to/from [employer]Stand;Stand Pivot Transfers Sit to Stand: Mod assist;+2 safety/equipment;+2 physical assistance (one with gait belt on R side, one assisting at L LE) Stand pivot transfers: Max assist;+2 physical assistance       General transfer comment: pt unable to achieve L knee extension  or L foot in complete contact with the floor even with physical assistance secondary to increased flexor tone.   Ambulation/Gait                 Stairs            Wheelchair Mobility    Modified Rankin (Stroke Patients Only) Modified Rankin (Stroke Patients Only) Pre-Morbid Rankin Score: No symptoms Modified Rankin: Severe disability     Balance Overall balance assessment: Needs assistance Sitting-balance support: Feet supported;Single extremity supported Sitting balance-Leahy Scale: Poor Sitting balance - Comments: pt able to sit EOB with R UE support and min guard for safety   Standing balance support: Bilateral upper extremity supported;During functional activity Standing balance-Leahy Scale: Poor Standing balance comment: mod A x2 to maintain upright standing                            Cognition Arousal/Alertness: Awake/alert Behavior During Therapy: Flat affect Overall Cognitive Status: Impaired/Different from baseline Area of Impairment: Awareness;Problem solving                           Awareness: Emergent Problem Solving: Requires tactile cues;Requires verbal cues;Slow processing General Comments: pt continues to have L inattention of body and environment. Pt required frequent multimodal cueing to attend to L.      Exercises Other Exercises Other Exercises: PT demonstrated and instructed pt and pt's [facility] for L LE and L UE    General Comments        Pertinent Vitals/Pain Pain Assessment:  No/denies pain    Home Living                      Prior Function            PT Goals (current goals can now be found in the care plan section) Acute Rehab PT Goals PT Goal Formulation: With patient/family Time For Goal Achievement: 12/20/16 Potential to Achieve Goals: Good Progress towards PT goals: Progressing toward goals    Frequency    Min 4X/week      PT Plan Current plan remains  appropriate    Co-evaluation              AM-PAC PT "6 Clicks" Daily Activity  Outcome Measure  Difficulty turning over in bed (including adjusting bedclothes, sheets and blankets)?: Total Difficulty moving from lying on back to sitting on the side of the bed? : Total Difficulty sitting down on and standing up from a chair with arms (e.g., wheelchair, bedside commode, etc,.)?: Total Help needed moving to and from a bed to chair (including a wheelchair)?: A Lot Help needed walking in hospital room?: Total Help needed climbing 3-5 steps with a railing? : Total 6 Click Score: 7    End of Session Equipment Utilized During Treatment: Gait belt Activity Tolerance: Patient tolerated treatment well Patient left: in chair;with call bell/phone within reach;with family/visitor present Nurse Communication: Mobility status PT Visit Diagnosis: Difficulty in walking, not elsewhere classified (R26.2);Hemiplegia and hemiparesis Hemiplegia - Right/Left: Left Hemiplegia - dominant/non-dominant: Non-dominant Hemiplegia - caused by: Cerebral infarction     Time: 0900-0920 PT Time Calculation (min) (ACUTE ONLY): 20 min  Charges:  $Therapeutic Activity: 8-22 mins                    G Codes:       [place], [place], [place] [phone]    Alessandra Bevels Blakeley Scheier 12/09/2016, 10:52 AM

## 2016-12-09 NOTE — Clinical Social Work Note (Signed)
Patient will discharge to CIR tomorrow.  CSW signing off. Consult again if any other social work needs arise.  Charlynn CourtSarah Yassin Scales, CSW [phone]

## 2016-12-09 NOTE — Progress Notes (Signed)
[facility]                                                                              Patient Demographics  Datha Kissinger, is a 41 y.o. female, DOB - December 10, 1975, ZOX:[id number]  Admit date - 12/03/2016   Admitting Physician Pearson Grippe, MD  Outpatient Primary MD for the patient is Patient, No Pcp Per  Outpatient specialists:   LOS - 6  days   Medical records reviewed and are as summarized below:    Chief Complaint  Patient presents with  . Code Stroke       Brief summary   Patient is a 41 year old female with history of migraines presented with severe headache and altered mental status. Patient presented to ED and had further complaints of left-sided jerking and weakness. MRI revealed multiple embolic strokes.   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 MRA with suspected right MCA M1 stenosis or occlusion - Patient presented with left facial droop and left hemiparesis - CT angiogram head and neck with the proximal right M1 occlusion - 2-D echo with EF of 60-65% with no source of emboli - TEE unremarkable negative for PFO or ASO or endocarditis - Lower extremity Dopplers negative for DVT - Hypercoagulability workup: Anti-dsDNA antibody negative, anticardiolipin IgM slightly elevated 13 (12 cutoff), beta 2 glycoprotein IgG elevated 109, protein C,S stable - Lipid panel showed LDL of 138 - Endocarditis ruled out, patient placed on aspirin 325 mg daily, Plavix, statin - TCD's showed no PFO, no MES. CTA neck concerning for right ICA soft plaque as well as left ICA with mild soft plaque - EEG showed right hemispheric slowing, diffuse slowing of the waking background - CT head with evolving acute large right MCA territory infarct without hemorrhagic conversion increasing 5 mm right-to-left  midline shift. Repeat CT head 7/19 showed stable shift - Discussed with inpatient rehabilitation, likely accepted 7/21   Principal problems   right ICA bifurcation plaque like structure -Per neurology   Hypertension -BP soft 109/65, continue gentle hydration  Hyperlipidemia LDL 138, continue Lipitor  Goal less than 70 LDL   Chronic migraine without aura without status migrainosus, not intractable - Currently stable, continue Topamax     Thyroid nodule - TSH within normal limits, will need thyroid ultrasound on follow-up in the outpatient setting     Dysphagia, post-stroke - Continue dysphagia 3 diet nectar thick   Code Status: full  DVT Prophylaxis:  Lovenox Family Communication: Discussed in detail with the patient, all imaging results, lab results explained to the patient and daughter at the bedside   Disposition Plan: CIR tomorrow 7/21  Time spent in minutes   15 minutes  Procedures:  MRI, MRA, TCD,  2-D echo: EF of 60-65% with no source of emboli Carotid Dopplers Transcranial Doppler bubble study completed. Dr. Roda Shutters performed. No apparent PFO at rest.  TEE normal  Consultants:   Neurology  Antimicrobials:      Medications  Scheduled  Meds: . aspirin  325 mg Oral Daily  . atorvastatin  80 mg Oral q1800  . clopidogrel  75 mg Oral Daily  . cyanocobalamin  1,000 mcg Intramuscular Daily  . enoxaparin (LOVENOX) injection  40 mg Subcutaneous Q24H  . sodium chloride flush  3 mL Intravenous Q12H  . topiramate  50 mg Oral Daily   Continuous Infusions: . sodium chloride 125 mL/hr at 12/07/16 1812   PRN Meds:.acetaminophen, ketorolac, RESOURCE THICKENUP CLEAR   Antibiotics   Anti-infectives    None        Subjective:   Elinore Shults was seen and examined today. Denies any specific complaints, sitting up in the chair. Left arm weakness still persisting. Feels hopeful. Patient denies dizziness, chest pain, shortness of breath, abdominal pain,  N/V/D/C.  Objective:   Vitals:   12/08/16 2113 12/08/16 2350 12/09/16 0656 12/09/16 0938  BP: 118/65 114/60 114/61 109/65  Pulse: 73 87 63 65  Resp: 18 18 18 20   Temp: 98.3 F (36.8 C) 98 F (36.7 C) 98.1 F (36.7 C) 97.6 F (36.4 C)  TempSrc: Oral Oral Oral Oral  SpO2: 99% 99% 97% 97%  Weight:      Height:        Intake/Output Summary (Last 24 hours) at 12/09/16 1338 Last data filed at 12/08/16 2215  Gross per 24 hour  Intake              123 ml  Output                0 ml  Net              123 ml     Wt Readings from Last 3 Encounters:  12/08/16 89.5 kg (197 lb 5 oz)     Exam  General: Alert and oriented x 3, NAD  Eyes:   HEENT:    Cardiovascular: S1 S2 auscultated, no rubs, murmurs or gallops. Regular rate and rhythm. No pedal edema b/l  Respiratory: CTAB  Gastrointestinal: Soft, nontender, nondistended, + bowel sounds  Ext: no pedal edema bilaterally  Neuro: LUE 0/5, LLE 1-2/5, Right side 5/5  Musculoskeletal: No digital cyanosis, clubbing  Skin: No rashes  Psych: Normal affect and demeanor, alert and oriented x3    Data Reviewed:  I have personally reviewed following labs and imaging studies  Micro Results No results found for this or any previous visit (from the past 240 hour(s)).  Radiology Reports Ct Angio Head W Or Wo Contrast  Result Date: 12/03/2016 CLINICAL DATA:  Right MCA territory infarct.  Migraine headaches. EXAM: CT ANGIOGRAPHY HEAD AND NECK TECHNIQUE: Multidetector CT imaging of the head and neck was performed using the standard protocol during bolus administration of intravenous contrast. Multiplanar CT image reconstructions and MIPs were obtained to evaluate the vascular anatomy. Carotid stenosis measurements (when applicable) are obtained utilizing NASCET criteria, using the distal internal carotid diameter as the denominator. CONTRAST:  50 mL Isovue 370 was administered into the right antecubital fossa with contrast  extravasation. Contrast extravasation consultation: Type of contrast: Isovue 370 site of extravasation: Right antecubital fossa Estimated volume of extravasation: <50 ml Area of extravasation scanned with CT? no PATIENT'S SIGNS AND SYMPTOMS Skin blistering/ulceration: no Decrease capillary refill: no Change in skin color: no Decreased motor function or severe tightness: Unable to assess due to stroke Decreased pulses distal to site of extravasation: no Altered sensation: Unable to assess due to stroke Increasing pain or signs of increased swelling during observation: no  TREATMENT Limb elevation: yes Ice packs applied: yes Heat pads applied: yes Plastic surgery consulted? no DOCUMENTATION AND FOLLOW-UP Site contrast extravasation forms submitted? yes Post extravasation orders completed? yes Was additional follow up assigned to PA's? Yes COMPARISON:  MRI brain from the same day at 11:55 a.m. CT head without contrast at 3:08 a.m. FINDINGS: CT HEAD FINDINGS Brain: Hypoattenuation involving the right temporal lobe, insular cortex, and basal ganglia matches the diffusion abnormality. There is effacement of the sulci. Midline shift is 2 mm. There is no hemorrhage. A hyperdense right MCA is noted. No new infarcts are present. Vascular: Hyperdense right MCA. Skull: The calvarium is intact. No focal lytic or blastic lesions are present. Sinuses: Minimal mucosal thickening is present in the left maxillary sinus. The remaining paranasal sinuses and the mastoid air cells are clear. Orbits: The globes and orbits are within normal limits. Review of the MIP images confirms the above findings CTA NECK FINDINGS Aortic arch: There is a common origin of the left common carotid artery and the innominate artery. There is no focal stenosis of the great vessel origins at the aorta. No significant vascular calcifications are present at the aorta. Right carotid system: The right common carotid artery is within normal limits. Soft tissue  plaque is noted along the posterior proximal right internal carotid artery at the bifurcation. There is a shelf-like plaque. The lumen is narrowed to 3 mm maximally. The more distal right internal carotid artery is within normal limits. Left carotid system: The left common carotid artery is within normal limits. Atherosclerotic changes are noted at the bifurcation. The cervical left internal carotid artery is normal. Vertebral arteries: The vertebral arteries both originate from the subclavian arteries without significant stenoses. Both vertebral arteries are small without significant focal stenosis in the neck. Skeleton: Age advanced degenerative changes are noted at C5-6 and C6-7. Left greater than right uncovertebral spurring is present at C4-5. Vertebral body heights are maintained. No focal lytic or blastic lesions are present. Other neck: 11 mm inferior thyroid nodule is present on the left. No other discrete thyroid lesions are present. There is no significant adenopathy. No focal mucosal lesions are evident. The salivary glands are within normal limits. Upper chest: Ground-glass attenuation of the lung apices bilaterally likely reflects edema or atelectasis. No focal nodule or mass lesion is present. Review of the MIP images confirms the above findings CTA HEAD FINDINGS Anterior circulation: Internal carotid artery's are within normal limits through the ICA termini bilaterally. The A1 segments are patent. Anterior communicating artery is patent. There is a proximal occlusion of the right M1 segment. The left M1 segment is within normal limits. The left MCA bifurcation is unremarkable. Left MCA and right ACA branch vessels are within normal limits. Anterior and superior right MCA branch vessels perfuse. Posterior and inferior branch vessels are occluded. Posterior circulation: The vertebral arteries are small. PICA origins are visualized and is normal. The basilar artery is small, terminating at the superior  cerebellar artery is. Fetal type posterior cerebral arteries are present bilaterally. The PCA branch vessels are within normal limits. Venous sinuses: The dural sinuses are patent. The right transverse sinus is dominant. The straight sinus deep cerebral veins are intact. Cortical veins are within normal limits. Anatomic variants: Bilateral fetal type posterior cerebral artery is. Delayed phase: The infarct is again noted. No pathologic enhancement is evident. Review of the MIP images confirms the above findings IMPRESSION: 1. Proximal right M1 conclusion. 2. Expected evolution of right temporal lobe, insular  cortex, and basal ganglia nonhemorrhagic infarction. 3. Focal irregularity at the right carotid bifurcation with a shelf-like soft tissue plaque but no significant stenosis relative to the more distal vessel. 4. More mild atherosclerotic changes at the left carotid bifurcation. 5. Fetal type posterior cerebral artery is bilaterally. 6. 11 mm thyroid nodule. Consider further evaluation with thyroid ultrasound. If patient is clinically hyperthyroid, consider nuclear medicine thyroid uptake and scan. Thyroid ultrasound can be done following discharge. These results were called by telephone at the time of interpretation on 12/03/2016 at 9:00 pm to Dr. Noel Christmas, who verbally acknowledged these results. Electronically Signed   By: Marin Roberts M.D.   On: 12/03/2016 21:15   Dg Wrist 2 Views Left  Result Date: 12/08/2016 CLINICAL DATA:  Bruising along the ulnar side of the left wrist and the distal ulna over the past several days. Pt is unsure when the bruises occurred and does not recall any injury. Pt denies pain in the left wrist but is also unable to move .*comment was truncated* EXAM: LEFT WRIST - 2 VIEW COMPARISON:  Plain film of the left wrist dated 08/07/2008. FINDINGS: Osseous alignment is normal. Bone mineralization is normal. No fracture line or displaced fracture fragment seen. No  degenerative change. Adjacent soft tissues appear somewhat thickened/edematous compared to previous exam. No focal soft tissue abnormality. IMPRESSION: 1. No acute osseous abnormality. 2. Probable soft tissue edema/thickening. Electronically Signed   By: Bary Richard M.D.   On: 12/08/2016 21:52   Ct Head Wo Contrast  Result Date: 12/08/2016 CLINICAL DATA:  Stroke EXAM: CT HEAD WITHOUT CONTRAST TECHNIQUE: Contiguous axial images were obtained from the base of the skull through the vertex without intravenous contrast. COMPARISON:  Head CT 12/06/2016 FINDINGS: Brain: There is cytotoxic edema within the right MCA territory and right basal ganglia, unchanged from the prior examination. There is no hemorrhagic conversion. There is unchanged mass effect on the right lateral ventricle. No hydrocephalus. Leftward midline shift measures 6 mm at the level of the foramina of [place], unchanged. Basal cisterns are patent. Vascular: No unexpected calcification. Skull: Normal visualized skull base, calvarium and extracranial soft tissues. Sinuses/Orbits: No sinus fluid levels or advanced mucosal thickening. No mastoid effusion. Normal orbits. IMPRESSION: 1. Unchanged appearance of right MCA territory infarct with 6 mm of leftward midline shift. No hemorrhagic conversion. 2. No ventricular entrapment or hydrocephalus. Electronically Signed   By: Deatra Robinson M.D.   On: 12/08/2016 06:09   Ct Head Wo Contrast  Result Date: 12/06/2016 CLINICAL DATA:  Stroke.  History of migraine. EXAM: CT HEAD WITHOUT CONTRAST TECHNIQUE: Contiguous axial images were obtained from the base of the skull through the vertex without intravenous contrast. COMPARISON:  CT HEAD December 03, 2016 FINDINGS: BRAIN: Evolving acute to early subacute RIGHT temporal, frontal, parietal and basal ganglia infarct. Partially effaced RIGHT lateral ventricle without entrapment. 5 mm RIGHT to LEFT midline shift, increased from 3 mm. No intraparenchymal hemorrhage.  Basal cisterns are patent. No abnormal extra-axial fluid collections. VASCULAR: Dense RIGHT middle cerebral artery. SKULL/SOFT TISSUES: No skull fracture. No significant soft tissue swelling. ORBITS/SINUSES: The included ocular globes and orbital contents are normal.Trace paranasal sinus mucosal thickening. Dehiscent RIGHT jugular bulb. Under pneumatized bilateral mastoid air cells with trace effusions, no air cell coalescence. OTHER: None. IMPRESSION: 1. Evolving acute large RIGHT MCA territory infarct without hemorrhagic conversion. 2. Increasing 5 mm RIGHT to LEFT midline shift without ventricular entrapment. 3. Dense RIGHT MCA consistent with thromboembolism. Electronically Signed   By: Pernell Dupre  Bloomer M.D.   On: 12/06/2016 06:31   Ct Angio Neck W Or Wo Contrast  Result Date: 12/03/2016 CLINICAL DATA:  Right MCA territory infarct.  Migraine headaches. EXAM: CT ANGIOGRAPHY HEAD AND NECK TECHNIQUE: Multidetector CT imaging of the head and neck was performed using the standard protocol during bolus administration of intravenous contrast. Multiplanar CT image reconstructions and MIPs were obtained to evaluate the vascular anatomy. Carotid stenosis measurements (when applicable) are obtained utilizing NASCET criteria, using the distal internal carotid diameter as the denominator. CONTRAST:  50 mL Isovue 370 was administered into the right antecubital fossa with contrast extravasation. Contrast extravasation consultation: Type of contrast: Isovue 370 site of extravasation: Right antecubital fossa Estimated volume of extravasation: <50 ml Area of extravasation scanned with CT? no PATIENT'S SIGNS AND SYMPTOMS Skin blistering/ulceration: no Decrease capillary refill: no Change in skin color: no Decreased motor function or severe tightness: Unable to assess due to stroke Decreased pulses distal to site of extravasation: no Altered sensation: Unable to assess due to stroke Increasing pain or signs of increased  swelling during observation: no TREATMENT Limb elevation: yes Ice packs applied: yes Heat pads applied: yes Plastic surgery consulted? no DOCUMENTATION AND FOLLOW-UP Site contrast extravasation forms submitted? yes Post extravasation orders completed? yes Was additional follow up assigned to PA's? Yes COMPARISON:  MRI brain from the same day at 11:55 a.m. CT head without contrast at 3:08 a.m. FINDINGS: CT HEAD FINDINGS Brain: Hypoattenuation involving the right temporal lobe, insular cortex, and basal ganglia matches the diffusion abnormality. There is effacement of the sulci. Midline shift is 2 mm. There is no hemorrhage. A hyperdense right MCA is noted. No new infarcts are present. Vascular: Hyperdense right MCA. Skull: The calvarium is intact. No focal lytic or blastic lesions are present. Sinuses: Minimal mucosal thickening is present in the left maxillary sinus. The remaining paranasal sinuses and the mastoid air cells are clear. Orbits: The globes and orbits are within normal limits. Review of the MIP images confirms the above findings CTA NECK FINDINGS Aortic arch: There is a common origin of the left common carotid artery and the innominate artery. There is no focal stenosis of the great vessel origins at the aorta. No significant vascular calcifications are present at the aorta. Right carotid system: The right common carotid artery is within normal limits. Soft tissue plaque is noted along the posterior proximal right internal carotid artery at the bifurcation. There is a shelf-like plaque. The lumen is narrowed to 3 mm maximally. The more distal right internal carotid artery is within normal limits. Left carotid system: The left common carotid artery is within normal limits. Atherosclerotic changes are noted at the bifurcation. The cervical left internal carotid artery is normal. Vertebral arteries: The vertebral arteries both originate from the subclavian arteries without significant stenoses. Both  vertebral arteries are small without significant focal stenosis in the neck. Skeleton: Age advanced degenerative changes are noted at C5-6 and C6-7. Left greater than right uncovertebral spurring is present at C4-5. Vertebral body heights are maintained. No focal lytic or blastic lesions are present. Other neck: 11 mm inferior thyroid nodule is present on the left. No other discrete thyroid lesions are present. There is no significant adenopathy. No focal mucosal lesions are evident. The salivary glands are within normal limits. Upper chest: Ground-glass attenuation of the lung apices bilaterally likely reflects edema or atelectasis. No focal nodule or mass lesion is present. Review of the MIP images confirms the above findings CTA HEAD FINDINGS Anterior  circulation: Internal carotid artery's are within normal limits through the ICA termini bilaterally. The A1 segments are patent. Anterior communicating artery is patent. There is a proximal occlusion of the right M1 segment. The left M1 segment is within normal limits. The left MCA bifurcation is unremarkable. Left MCA and right ACA branch vessels are within normal limits. Anterior and superior right MCA branch vessels perfuse. Posterior and inferior branch vessels are occluded. Posterior circulation: The vertebral arteries are small. PICA origins are visualized and is normal. The basilar artery is small, terminating at the superior cerebellar artery is. Fetal type posterior cerebral arteries are present bilaterally. The PCA branch vessels are within normal limits. Venous sinuses: The dural sinuses are patent. The right transverse sinus is dominant. The straight sinus deep cerebral veins are intact. Cortical veins are within normal limits. Anatomic variants: Bilateral fetal type posterior cerebral artery is. Delayed phase: The infarct is again noted. No pathologic enhancement is evident. Review of the MIP images confirms the above findings IMPRESSION: 1. Proximal  right M1 conclusion. 2. Expected evolution of right temporal lobe, insular cortex, and basal ganglia nonhemorrhagic infarction. 3. Focal irregularity at the right carotid bifurcation with a shelf-like soft tissue plaque but no significant stenosis relative to the more distal vessel. 4. More mild atherosclerotic changes at the left carotid bifurcation. 5. Fetal type posterior cerebral artery is bilaterally. 6. 11 mm thyroid nodule. Consider further evaluation with thyroid ultrasound. If patient is clinically hyperthyroid, consider nuclear medicine thyroid uptake and scan. Thyroid ultrasound can be done following discharge. These results were called by telephone at the time of interpretation on 12/03/2016 at 9:00 pm to Dr. Noel ChristmasHARLES STEWART, who verbally acknowledged these results. Electronically Signed   By: Marin Robertshristopher  Mattern M.D.   On: 12/03/2016 21:15   Mr Brain Wo Contrast  Result Date: 12/03/2016 CLINICAL DATA:  Slurred speech and LEFT-sided weakness beginning earlier today. EXAM: MRI HEAD WITHOUT CONTRAST TECHNIQUE: Multiplanar, multiecho pulse sequences of the brain and surrounding structures were obtained without intravenous contrast. COMPARISON:  Noncontrast CT head earlier today. FINDINGS: Significant motion degradation. The study is diagnostic for the clinical indication, but small or subtle lesions could be overlooked. Brain: Large area of restricted diffusion, RIGHT MCA territory, involves the temporal lobe, small portion of the frontal lobe, insula, and basal ganglia, as well as the regional white matter. Findings consistent with acute cerebral infarction. This is not visible on prior CT. Early mass effect RIGHT to LEFT of 1-2 mm. Gradient sequence shows significant motion degradation, pre occluding definitive excess but of hemorrhage. No gross lobar hemorrhage. No mass lesion, hydrocephalus, or extra-axial fluid. Vascular: Suspected RIGHT MCA M1 stenosis or occlusion. This is difficult to confirm  due to motion degradation. Skull and upper cervical spine: Normal marrow signal. Sinuses/Orbits: Negative. Other: None. IMPRESSION: Motion degraded exam. Large RIGHT MCA territory nonhemorrhagic infarct as described. There is early mass effect RIGHT-to-LEFT of 1-2 mm. Close follow-up recommended. Within limits for detection on this motion degraded exam, there is no gross hemorrhage, although small areas could be overlooked. Consider CT follow-up. Suspected RIGHT MCA M1 stenosis or occlusion. This could be further assessed with CT angiography of the head and neck, but only after the patient is more cooperative. These results were called by telephone at the time of interpretation on 12/03/2016 at 1:04 pm to Dr. Wilford CornerArora, who verbally acknowledged these results. Electronically Signed   By: Elsie StainJohn T Curnes M.D.   On: 12/03/2016 13:05   Dg Swallowing Func-speech Pathology  Result  Date: 12/03/2016 Objective Swallowing Evaluation: Type of Study: MBS-Modified Barium Swallow Study Patient Details Name: Teriah Muela MRN: [id number] Date of Birth: October 25, 1975 Today's Date: 12/03/2016 Time: SLP Start Time (ACUTE ONLY): 1238-SLP Stop Time (ACUTE ONLY): 1252 SLP Time Calculation (min) (ACUTE ONLY): 14 min Past Medical History: Past Medical History: Diagnosis Date . Migraine  Past Surgical History: Past Surgical History: Procedure Laterality Date . CESAREAN SECTION Bilateral 1995, 2002 HPI: 41 y.o. female w hx of migraines was at home around 11pm c/o headache and then had altered mental status; BSE completed and pt kept NPO d/t overt s/s of aspiration including delayed throat clearing/cough with thin liquids; anterior left loss; CT negative; MRI pending No Data Recorded Assessment / Plan / Recommendation CHL IP CLINICAL IMPRESSIONS 12/03/2016 Clinical Impression Pt presents with mild-moderate oropharyngeal dysphagia characterized by premature spillage to pyriform sinuses prior to swallow triggering d/t sensory impairment; anterior  loss noted with liquids either via tsp and/or cup; chin tuck and head turn left attempted with larger volumes of thin and nectar-thickened liquids, but was inconsistent d/t pt lethargy, subsequently, larger volumes of thin entered laryngeal vestibule with cup sips and to level of the vocal folds with straw sips and ejected out; no penetration and/or aspiration noted with nectar-thickened liquids via cup/straw, but pt is at risk with larger amounts; mechanical/soft consistency attempted, but removed from oral cavity d/t impaired mastication/poor lingual control/manipulation paired with pt lethargy; recommend Dysphagia 1 (puree) with nectar-thickened liquids via individual straw sips to reduce anterior loss presented on right side pending pt alertness level and increased appetite with swallowing precautions in place during PO intake SLP Visit Diagnosis Dysphagia, oropharyngeal phase (R13.12) Attention and concentration deficit following -- Frontal lobe and executive function deficit following -- Impact on safety and function Moderate aspiration risk   CHL IP TREATMENT RECOMMENDATION 12/03/2016 Treatment Recommendations Therapy as outlined in treatment plan below   Prognosis 12/03/2016 Prognosis for Safe Diet Advancement Good Barriers to Reach Goals -- Barriers/Prognosis Comment -- [facility] IP DIET RECOMMENDATION 12/03/2016 SLP Diet Recommendations Dysphagia 1 (Puree) solids;Nectar thick liquid Liquid Administration via Cup;Straw;Other (Comment) Medication Administration Crushed with puree Compensations Slow rate;Small sips/bites;Lingual sweep for clearance of pocketing;Monitor for anterior loss Postural Changes --   CHL IP OTHER RECOMMENDATIONS 12/03/2016 Recommended Consults -- Oral Care Recommendations Oral care BID Other Recommendations Order thickener from pharmacy   CHL IP FOLLOW UP RECOMMENDATIONS 12/03/2016 Follow up Recommendations Other (comment)   CHL IP FREQUENCY AND DURATION 12/03/2016 Speech Therapy Frequency (ACUTE  ONLY) min 2x/week Treatment Duration 1 week      CHL IP ORAL PHASE 12/03/2016 Oral Phase Impaired Oral - Pudding Teaspoon -- Oral - Pudding Cup -- Oral - Honey Teaspoon -- Oral - Honey Cup -- Oral - Nectar Teaspoon Left anterior bolus loss;Premature spillage Oral - Nectar Cup -- Oral - Nectar Straw Premature spillage Oral - Thin Teaspoon Left anterior bolus loss;Premature spillage Oral - Thin Cup Left anterior bolus loss;Premature spillage Oral - Thin Straw Premature spillage Oral - Puree Premature spillage Oral - Mech Soft -- Oral - Regular -- Oral - Multi-Consistency -- Oral - Pill -- Oral Phase - Comment (No Data)  CHL IP PHARYNGEAL PHASE 12/03/2016 Pharyngeal Phase Impaired Pharyngeal- Pudding Teaspoon -- Pharyngeal -- Pharyngeal- Pudding Cup -- Pharyngeal -- Pharyngeal- Honey Teaspoon -- Pharyngeal -- Pharyngeal- Honey Cup -- Pharyngeal -- Pharyngeal- Nectar Teaspoon Delayed swallow initiation-pyriform sinuses Pharyngeal Material does not enter airway Pharyngeal- Nectar Cup Delayed swallow initiation-pyriform sinuses Pharyngeal Material does not enter airway Pharyngeal-  Nectar Straw Delayed swallow initiation-pyriform sinuses Pharyngeal Material does not enter airway Pharyngeal- Thin Teaspoon Delayed swallow initiation-pyriform sinuses Pharyngeal Material enters airway, remains ABOVE vocal cords then ejected out Pharyngeal- Thin Cup Delayed swallow initiation-pyriform sinuses;Compensatory strategies attempted (chin tuck; head turn left) Pharyngeal Material enters airway, remains ABOVE vocal cords then ejected out Pharyngeal- Thin Straw Delayed swallow initiation-pyriform sinuses;Penetration/Aspiration before swallow;Trace aspiration;Compensatory strategies attempted (chin tuck/head turn left) Pharyngeal Material enters airway, CONTACTS cords and then ejected out Pharyngeal- Puree Delayed swallow initiation-vallecula Pharyngeal -- Pharyngeal- Mechanical Soft NT Pharyngeal -- Pharyngeal- Regular -- Pharyngeal --  Pharyngeal- Multi-consistency -- Pharyngeal -- Pharyngeal- Pill -- Pharyngeal -- Pharyngeal Comment (No Data)  [facility] IP CERVICAL ESOPHAGEAL PHASE 12/03/2016 Cervical Esophageal Phase WFL Pudding Teaspoon -- Pudding Cup -- Honey Teaspoon -- Honey Cup -- Nectar Teaspoon -- Nectar Cup -- Nectar Straw -- Thin Teaspoon -- Thin Cup -- Thin Straw -- Puree -- Mechanical Soft -- Regular -- Multi-consistency -- Pill -- Cervical Esophageal Comment -- CHL IP GO 12/03/2016 Functional Assessment Tool Used NOMS; clinical judgment Functional Limitations Swallowing Swallow Current Status (Z3664) CK Swallow Goal Status (Q0347) CJ Swallow Discharge Status (Q2595) (None) Motor Speech Current Status (G3875) (None) Motor Speech Goal Status (I4332) (None) Motor Speech Goal Status (R5188) (None) Spoken Language Comprehension Current Status (C1660) (None) Spoken Language Comprehension Goal Status (Y3016) (None) Spoken Language Comprehension Discharge Status (W1093) (None) Spoken Language Expression Current Status (A3557) (None) Spoken Language Expression Goal Status (D2202) (None) Spoken Language Expression Discharge Status [phone]) (None) Attention Current Status (W2376) (None) Attention Goal Status (E8315) (None) Attention Discharge Status (V7616) (None) Memory Current Status (W7371) (None) Memory Goal Status (G6269) (None) Memory Discharge Status (S8546) (None) Voice Current Status (E7035) (None) Voice Goal Status (K0938) (None) Voice Discharge Status (H8299) (None) Other Speech-Language Pathology Functional Limitation Current Status (B7169) (None) Other Speech-Language Pathology Functional Limitation Goal Status (C7893) (None) Other Speech-Language Pathology Functional Limitation Discharge Status [phone]) (None) Tressie Stalker, M.S., CCC-SLP 12/03/2016, 1:18 PM              Ct Head Code Stroke W/o Cm  Result Date: 12/03/2016 CLINICAL DATA:  Code stroke. Slurred speech and weakness. Last seen normal at 0200 hours. EXAM: CT HEAD WITHOUT CONTRAST  TECHNIQUE: Contiguous axial images were obtained from the base of the skull through the vertex without intravenous contrast. COMPARISON:  None. FINDINGS: Mild motion degraded examination. BRAIN: No intraparenchymal hemorrhage, mass effect nor midline shift. The ventricles and sulci are normal. No acute large vascular territory infarcts. No abnormal extra-axial fluid collections. Basal cisterns are patent. VASCULAR: Unremarkable. SKULL/SOFT TISSUES: No skull fracture. No significant soft tissue swelling. ORBITS/SINUSES: The included ocular globes and orbital contents are normal.The mastoid aircells and included paranasal sinuses are well-aerated. Dehiscent RIGHT jugular bulb. OTHER: None. ASPECTS [facility] Stroke Program Early CT Score) - Ganglionic level infarction (caudate, lentiform nuclei, internal capsule, insula, M1-M3 cortex): 7 - Supraganglionic infarction (M4-M6 cortex): 3 Total score (0-10 with 10 being normal): 10 IMPRESSION: 1. Negative mildly motion degraded noncontrast CT HEAD. 2. ASPECTS is 10. 3. Critical Value/emergent results were called by telephone at the time of interpretation on 12/03/2016 at 3:15 am to Dr. Nicholas Lose, Neurology, who verbally acknowledged these results. Electronically Signed   By: Awilda Metro M.D.   On: 12/03/2016 03:17    Lab Data:  CBC:  Recent Labs Lab 12/03/16 0254 12/03/16 0304 12/04/16 0419 12/05/16 1516 12/07/16 0415  WBC 7.4  --  12.5* 8.9 6.7  NEUTROABS 3.4  --   --  5.9  --   HGB 13.3 13.3 13.5 13.5 12.6  HCT 40.5 39.0 41.9 42.1 39.4  MCV 84.7  --  85.2 85.9 84.7  PLT 259  --  307 251 240   Basic Metabolic Panel:  Recent Labs Lab 12/03/16 0254 12/03/16 0304 12/04/16 0419 12/05/16 1516 12/06/16 0903 12/07/16 0415  NA 138 140 138 137 137 135  K 3.5 3.4* 3.8 3.6 3.8 3.8  CL 105 103 112* 107 110 111  CO2 24  --  20* 22 21* 17*  GLUCOSE 138* 135* 198* 88 108* 105*  BUN 12 12 8 17 12 11   CREATININE 0.86 0.90 0.84 0.90 0.81 0.76    CALCIUM 8.5*  --  8.4* 8.1* 8.2* 8.0*  MG  --   --   --  1.8  --   --    GFR: Estimated Creatinine Clearance: 99.2 mL/min (by C-G formula based on SCr of 0.76 mg/dL). Liver Function Tests:  Recent Labs Lab 12/03/16 0254 12/04/16 0419  AST 34 25  ALT 54 47  ALKPHOS 92 95  BILITOT 0.8 0.7  PROT 6.3* 6.4*  ALBUMIN 2.8* 2.7*   No results for input(s): LIPASE, AMYLASE in the last 168 hours. No results for input(s): AMMONIA in the last 168 hours. Coagulation Profile:  Recent Labs Lab 12/03/16 0254  INR 0.94   Cardiac Enzymes: No results for input(s): CKTOTAL, CKMB, CKMBINDEX, TROPONINI in the last 168 hours. BNP (last 3 results) No results for input(s): PROBNP in the last 8760 hours. HbA1C: No results for input(s): HGBA1C in the last 72 hours. CBG:  Recent Labs Lab 12/03/16 0257  GLUCAP 134*   Lipid Profile: No results for input(s): CHOL, HDL, LDLCALC, TRIG, CHOLHDL, LDLDIRECT in the last 72 hours. Thyroid Function Tests: No results for input(s): TSH, T4TOTAL, FREET4, T3FREE, THYROIDAB in the last 72 hours. Anemia Panel: No results for input(s): VITAMINB12, FOLATE, FERRITIN, TIBC, IRON, RETICCTPCT in the last 72 hours. Urine analysis: No results found for: COLORURINE, APPEARANCEUR, LABSPEC, PHURINE, GLUCOSEU, HGBUR, BILIRUBINUR, KETONESUR, PROTEINUR, UROBILINOGEN, NITRITE, LEUKOCYTESUR   Ripudeep Rai M.D. [facility] 12/09/2016, 1:38 PM  Pager: [phone] Between 7am to 7pm - call Pager - [phone]  After 7pm go to [email or web address] - password [email or web address]  Call night coverage person covering after 7pm

## 2016-12-09 NOTE — Progress Notes (Signed)
Ordered Stroke mapping from pharmacy. Did speak to patient, gave emotional support to patient and daughter who is at bedside.

## 2016-12-09 NOTE — Progress Notes (Signed)
Rehab admissions - I will have an inpatient rehab bed open tomorrow, Saturday, for patient.  I spoke with Dr. Isidoro Donningai who is in agreement.  I will contact SW and case manager as well.  Call me for questions.  #[phone]

## 2016-12-09 NOTE — Progress Notes (Signed)
  Speech Language Pathology Treatment: Dysphagia;Cognitive-Linquistic  Patient Details Name: Shannon DiceCynthia Erickson MRN: [id number] DOB: 07/05/1975 Today's Date: 12/09/2016 Time: [id number] SLP Time Calculation (min) (ACUTE ONLY): 41 min  Assessment / Plan / Recommendation Clinical Impression  Pt demonstrating much improved timeliness of responses and swallowing ability!  Observed pt consuming soda with breakfast of pancakes, eggs, sausage.  Slow but effective mastication without oral pocketing.  NO indication of airway compromise with thin liquids - even when challenged with consumption during meal.  Will advance diet to dys3/thin  - continue medication with puree please.       Treatment also focused on cognitive linguistic goals including improving attention to the left and attending to communication partner.  Pt benefited from min cues to attend to partner - TV turned off to decrease distractions.  Min cues overall to attend to left side of room, looking to left on tray independently.  Improved phonatory strength noted today with marginally stronger cough.  Pt no longer reports improved phonatory strength/cough with turning on left side- as observed on prior days.  Pt continues to be motivated and encouraged by her advancements.  Continues to be an excellent rehab candidate.   Continue SLP to address dysphagia, cognitive linguistic goals to decrease caregiver burden.  Pt has excellent family support!        HPI HPI: 41 y.o. female w hx of migraines was at home around 11pm c/o headache and then had altered mental status.  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  Pt seen for follow up for dysphagia management and SLE ordered.         SLP Plan  Continue with current plan of care       Recommendations  Diet recommendations: Thin liquid;Dysphagia 3 (mechanical soft) Liquids provided via: Straw;Cup Medication Administration: Whole meds with  puree Supervision: Patient able to self feed;Full supervision/cueing for compensatory strategies Compensations: Small sips/bites;Lingual sweep for clearance of pocketing;Slow rate;Monitor for anterior loss (chin tuck with thin water) Postural Changes and/or Swallow Maneuvers: Seated upright 90 degrees;Upright 30-60 min after meal                Oral Care Recommendations: Oral care QID Follow up Recommendations: Inpatient Rehab SLP Visit Diagnosis: Dysphagia, oropharyngeal phase (R13.12) Plan: Continue with current plan of care       GO               Donavan Burnetamara Elan Brainerd, MS [facility] SLP [phone]  Chales AbrahamsKimball, Morgyn Marut Ann 12/09/2016, 12:59 PM

## 2016-12-09 NOTE — PMR Pre-admission (Signed)
PMR Admission Coordinator Pre-Admission Assessment  Patient: Shannon Erickson is an 41 y.o., female MRN: [id number] DOB: 07-31-1975 Height: 5\' 3"  (160 cm) Weight: 89.5 kg (197 lb 5 oz)              Insurance Information Self pay with medicaid application pending  Medicaid Application Date:  Pending      Case Manager:   Disability Application Date: Pending      Case Worker:    Emergency Contact Information Contact Information    Name Relation Home Work Mobile   Cl[place] Daughter   [phone]   Gracemarie, Skeet   [phone]   Natoria, Archibald Father   [phone]   Clemson,[name] Relative   [phone]     Current Medical History  Patient Admitting Diagnosis: Right MCA territory nonhemorrhagic infarct   History of Present Illness: A 40 y.o.right handed femalewith history of migraine headaches.Per chart review and daughter-in-law, patient lives with family. Two-level home with bedroom first floor. Independent prior to admission.Presented 12/03/2016 with left-sided weakness and jerking movement as well as headache. CT/MRI reviewed, showing right MCA CVA. Per report, large right MCA territory nonhemorrhagic infarct. Patient did not receive TPA. CT Angio head and neck showed a proximal right M1 occlusion. Incidental finding of a 11 mm thyroid nodule. Patient did not receive TPA. Echocardiogram with ejection fraction of 65% no wall motion abnormalities . EEG with diffuse slowingher activity. Venous Doppler studies lower extremity negative. TEE showed no PFO or ASD. Aspirin and Plavix for CVA prophylaxis. Subcutaneous Lovenox for DVT prophylaxis. Dysphagia #3 nectar  thick liquid diet. Formal physical and occupational therapy evaluations completed the recommendations of physical medicine rehabilitation consult. Patient to be admitted for comprehensive inpatient rehabilitation program.  Total: 12=NIH  Past Medical History  Past Medical History:  Diagnosis Date  .  Migraine     Family History  family history is not on file.  Prior Rehab/Hospitalizations: No previous rehab admissions.  Has the patient had major surgery during 100 days prior to admission? No  Current Medications   Current Facility-Administered Medications:  .  0.9 %  sodium chloride infusion, , Intravenous, Continuous, Rodolph Bong, MD, Last Rate: 125 mL/hr at 12/07/16 1812 .  acetaminophen (TYLENOL) tablet 650 mg, 650 mg, Oral, Q6H PRN, Rai, Ripudeep K, MD .  aspirin tablet 325 mg, 325 mg, Oral, Daily, Marvel Plan, MD, 325 mg at 12/08/16 1049 .  atorvastatin (LIPITOR) tablet 80 mg, 80 mg, Oral, q1800, Marvel Plan, MD, 80 mg at 12/08/16 1731 .  clopidogrel (PLAVIX) tablet 75 mg, 75 mg, Oral, Daily, Marvel Plan, MD, 75 mg at 12/08/16 1049 .  cyanocobalamin ((VITAMIN B-12)) injection 1,000 mcg, 1,000 mcg, Intramuscular, Daily, Rodolph Bong, MD, 1,000 mcg at 12/08/16 1049 .  enoxaparin (LOVENOX) injection 40 mg, 40 mg, Subcutaneous, Q24H, Pearson Grippe, MD, 40 mg at 12/08/16 1731 .  ketorolac (TORADOL) 15 MG/ML injection 15 mg, 15 mg, Intravenous, Q6H PRN, Rai, Ripudeep K, MD, 15 mg at 12/09/16 0044 .  RESOURCE THICKENUP CLEAR, , Oral, PRN, Rodolph Bong, MD .  sodium chloride flush (NS) 0.9 % injection 3 mL, 3 mL, Intravenous, Q12H, Pearson Grippe, MD, 3 mL at 12/08/16 2213 .  topiramate (TOPAMAX) tablet 50 mg, 50 mg, Oral, Daily, Eshraghi, Shervin, MD, 50 mg at 12/08/16 1049  Patients Current Diet: DIET DYS 3 Room service appropriate? Yes with Assist; Fluid consistency: Thin  Precautions / Restrictions Precautions Precautions: Fall Restrictions Weight Bearing Restrictions: No   Has the patient had 2  or more falls or a fall with injury in the past year?No  Prior Activity Level Community (5-7x/wk): Went out daily.  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  Home Assistive Devices / Equipment Home Assistive Devices/Equipment: None Home Equipment:  None  Prior Device Use: Indicate devices/aids used by the patient prior to current illness, exacerbation or injury? None  Prior Functional Level Prior Function Level of Independence: Independent Comments: works full time, very independent  Self Care: Did the patient need help bathing, dressing, using the toilet or eating?  Independent  Indoor Mobility: Did the patient need assistance with walking from room to room (with or without device)? Independent  Stairs: Did the patient need assistance with internal or external stairs (with or without device)? Independent  Functional Cognition: Did the patient need help planning regular tasks such as shopping or remembering to take medications? Independent  Current Functional Level Cognition  Arousal/Alertness: Awake/alert Overall Cognitive Status: Impaired/Different from baseline Current Attention Level: Sustained Orientation Level: Oriented X4 Following Commands: Follows one step commands consistently General Comments: pt continues to have L inattention of body and environment. Pt required frequent multimodal cueing to attend to L. Attention: Sustained, Selective Sustained Attention: Appears intact Selective Attention: Impaired Selective Attention Impairment: Functional basic Memory: Impaired Memory Impairment: Retrieval deficit Problem Solving: Impaired Safety/Judgment: Appears intact    Extremity Assessment (includes Sensation/Coordination)  Upper Extremity Assessment: LUE deficits/detail LUE Deficits / Details: Inattention to L UE, increased tone, and decreased active movement on command. Pt with slight movement of L UE during testing of righting reactions.  LUE Sensation: decreased proprioception, decreased light touch LUE Coordination: decreased fine motor, decreased gross motor  Lower Extremity Assessment: Defer to PT evaluation LLE Deficits / Details: noted left side inattention, increased tone, decreased awareness and poor  strength/coordination. Increased tone kicks in with task performance LLE Sensation: decreased light touch, decreased proprioception LLE Coordination: decreased fine motor, decreased gross motor    ADLs  Overall ADL's : Needs assistance/impaired Grooming: Moderate assistance, Sitting Upper Body Bathing: Moderate assistance, Sitting Lower Body Bathing: Maximal assistance, Sit to/from stand, +2 for physical assistance Upper Body Dressing : Moderate assistance, Sitting Lower Body Dressing: Maximal assistance, Sit to/from stand, +2 for physical assistance Toilet Transfer: +2 for physical assistance, Stand-pivot, Maximal assistance Toileting- Clothing Manipulation and Hygiene: Maximal assistance, +2 for physical assistance, Sit to/from stand Functional mobility during ADLs: Moderate assistance, +2 for physical assistance (stand-pivot only) General ADL Comments: Max assist +2 to maintain balance during pericare. Upon standing for simulated toilet transfer, pt with incontinence of bowel.     Mobility  Overal bed mobility: Needs Assistance Bed Mobility: Rolling, Sidelying to Sit Rolling: Min assist Sidelying to sit: Min assist Supine to sit: Mod assist General bed mobility comments: pt required increased time, verbal and tactile cueing for sequencing, use of bed rails, assist to bring bilateral LEs off of bed. Pt able to use R UE to elevate trunk and achieve sitting EOB    Transfers  Overall transfer level: Needs assistance Equipment used: 2 person hand held assist Transfers: Sit to/from Stand, Stand Pivot Transfers Sit to Stand: Mod assist, +2 safety/equipment, +2 physical assistance (one with gait belt on R side, one assisting at L LE) Stand pivot transfers: Max assist, +2 physical assistance General transfer comment: pt unable to achieve L knee extension or L foot in complete contact with the floor even with physical assistance secondary to increased flexor tone.     Ambulation / Gait /  Stairs / Engineer, drilling / Balance Dynamic Sitting Balance Sitting balance - Comments: pt able to sit EOB with R UE support and min guard for safety Balance Overall balance assessment: Needs assistance Sitting-balance support: Feet supported, Single extremity supported Sitting balance-Leahy Scale: Poor Sitting balance - Comments: pt able to sit EOB with R UE support and min guard for safety Postural control: Left lateral lean Standing balance support: Bilateral upper extremity supported, During functional activity Standing balance-Leahy Scale: Poor Standing balance comment: mod A x2 to maintain upright standing    Special needs/care consideration BiPAP/CPAP No CPM No Continuous Drip IV 0.9% NS 125 mL/hr Dialysis No     Life Vest No Oxygen No Special Bed No Trach Size No Wound Vac (area) No     Skin No                            Bowel mgmt: Last BM 12/08/16 Bladder mgmt: Incontinence Diabetic mgmt No    Previous Home Environment Living Arrangements: Children, Other relatives Available Help at Discharge: Family Type of Home: House Home Layout: Two level, Able to live on main level with bedroom/bathroom Alternate Level Stairs-Number of Steps: flight Home Access: Stairs to enter Entrance Stairs-Rails: None Entrance Stairs-Number of Steps: 3 Home Care Services: No  Discharge Living Setting Plans for Discharge Living Setting: House, Lives with (comment) (Lives with son, dtr-in-law, another son, dtr to come stay.) Type of Home at Discharge: House Discharge Home Layout: Two level, Able to live on main level with bedroom/bathroom Alternate Level Stairs-Number of Steps: 1 step up into bedroom and 1/2 bath; 1 step back down to full bath with shower. Discharge Home Access: Stairs to enter Entrance Stairs-Number of Steps: 2 step entry. Does the patient have any problems obtaining your medications?: No  Social/Family/Support Systems Patient Roles: Parent, Other  (Comment) (Has a dtr, son, dtr-in-law, father.) Contact Information: Colen Darling - daughter Anticipated Caregiver: daughter and family Anticipated Caregiver's Contact Information: Hillery Hunter - daughter - [phone] Ability/Limitations of Caregiver: Daughter from [place] plans to stay with patient after discharge from rehab for a month as needed.  She is 36 yo.  Also son and dtr-in-law to assist when not working. Caregiver Availability: 24/7 Discharge Plan Discussed with Primary Caregiver: Yes Is Caregiver In Agreement with Plan?: Yes Does Caregiver/Family have Issues with Lodging/Transportation while Pt is in Rehab?: No  Goals/Additional Needs Patient/Family Goal for Rehab: PT/OT min to mod assist, SLP  supervision goals Expected length of stay: 21-24 days Cultural Considerations: None Dietary Needs: Dys 3, nectar thick liquids Equipment Needs: TBD Pt/Family Agrees to Admission and willing to participate: Yes Program Orientation Provided & Reviewed with Pt/Caregiver Including Roles  & Responsibilities: Yes  Decrease burden of Care through IP rehab admission: N/A  Possible need for SNF placement upon discharge: Not planned  Patient Condition: This patient's medical and functional status has changed since the consult dated: 12/05/16 in which the Rehabilitation Physician determined and documented that the patient's condition is appropriate for intensive rehabilitative care in an inpatient rehabilitation facility. See "History of Present Illness" (above) for medical update. Functional changes are: Currently requiring max assist for stand pivot transfers. Patient's medical and functional status update has been discussed with the Rehabilitation physician and patient remains appropriate for inpatient rehabilitation. Will admit to inpatient rehab tomorrow.  Preadmission Screen Completed By:  Trish Mage, 12/09/2016 11:20  AM ______________________________________________________________________   Discussed  status with Dr. Riley Kill on 12/09/16 at 1024 and received telephone approval for admission tomorrow.  Admission Coordinator:  Trish Mage, time 1120/Date 12/09/16

## 2016-12-10 ENCOUNTER — Inpatient Hospital Stay (HOSPITAL_COMMUNITY)
Admission: RE | Admit: 2016-12-10 | Discharge: 2017-01-14 | DRG: 057 | Disposition: A | Discharge status: Home Health | Payer: Medicaid Other | Source: Ambulatory Visit | Attending: Physical Medicine & Rehabilitation | Admitting: Physical Medicine & Rehabilitation

## 2016-12-10 ENCOUNTER — Encounter (HOSPITAL_COMMUNITY): Payer: Self-pay

## 2016-12-10 DIAGNOSIS — E669 Obesity, unspecified: Secondary | ICD-10-CM | POA: Diagnosis present

## 2016-12-10 DIAGNOSIS — Z880 Allergy status to penicillin: Secondary | ICD-10-CM | POA: Diagnosis not present

## 2016-12-10 DIAGNOSIS — R1310 Dysphagia, unspecified: Secondary | ICD-10-CM | POA: Diagnosis present

## 2016-12-10 DIAGNOSIS — G43709 Chronic migraine without aura, not intractable, without status migrainosus: Secondary | ICD-10-CM | POA: Diagnosis present

## 2016-12-10 DIAGNOSIS — R269 Unspecified abnormalities of gait and mobility: Secondary | ICD-10-CM | POA: Diagnosis present

## 2016-12-10 DIAGNOSIS — E041 Nontoxic single thyroid nodule: Secondary | ICD-10-CM | POA: Diagnosis present

## 2016-12-10 DIAGNOSIS — I63511 Cerebral infarction due to unspecified occlusion or stenosis of right middle cerebral artery: Secondary | ICD-10-CM | POA: Diagnosis present

## 2016-12-10 DIAGNOSIS — I69319 Unspecified symptoms and signs involving cognitive functions following cerebral infarction: Secondary | ICD-10-CM | POA: Diagnosis not present

## 2016-12-10 DIAGNOSIS — K5901 Slow transit constipation: Secondary | ICD-10-CM | POA: Diagnosis present

## 2016-12-10 DIAGNOSIS — I69320 Aphasia following cerebral infarction: Secondary | ICD-10-CM

## 2016-12-10 DIAGNOSIS — E785 Hyperlipidemia, unspecified: Secondary | ICD-10-CM | POA: Diagnosis present

## 2016-12-10 DIAGNOSIS — R252 Cramp and spasm: Secondary | ICD-10-CM

## 2016-12-10 DIAGNOSIS — I69354 Hemiplegia and hemiparesis following cerebral infarction affecting left non-dominant side: Principal | ICD-10-CM

## 2016-12-10 DIAGNOSIS — R32 Unspecified urinary incontinence: Secondary | ICD-10-CM | POA: Diagnosis present

## 2016-12-10 DIAGNOSIS — Z6834 Body mass index (BMI) 34.0-34.9, adult: Secondary | ICD-10-CM

## 2016-12-10 DIAGNOSIS — R414 Neurologic neglect syndrome: Secondary | ICD-10-CM | POA: Diagnosis present

## 2016-12-10 DIAGNOSIS — I69391 Dysphagia following cerebral infarction: Secondary | ICD-10-CM

## 2016-12-10 DIAGNOSIS — J00 Acute nasopharyngitis [common cold]: Secondary | ICD-10-CM

## 2016-12-10 DIAGNOSIS — W1800XA Striking against unspecified object with subsequent fall, initial encounter: Secondary | ICD-10-CM

## 2016-12-10 DIAGNOSIS — I69398 Other sequelae of cerebral infarction: Secondary | ICD-10-CM

## 2016-12-10 HISTORY — DX: Cerebral infarction due to unspecified occlusion or stenosis of right middle cerebral artery: I63.511

## 2016-12-10 LAB — URINALYSIS, ROUTINE W REFLEX MICROSCOPIC
Bilirubin Urine: NEGATIVE
GLUCOSE, UA: NEGATIVE mg/dL
Ketones, ur: NEGATIVE mg/dL
Leukocytes, UA: NEGATIVE
Nitrite: NEGATIVE
PH: 7 (ref 5.0–8.0)
PROTEIN: NEGATIVE mg/dL
Specific Gravity, Urine: 1.012 (ref 1.005–1.030)

## 2016-12-10 LAB — FACTOR 5 LEIDEN

## 2016-12-10 MED ORDER — METHOCARBAMOL 500 MG PO TABS
500.0000 mg | ORAL_TABLET | Freq: Four times a day (QID) | ORAL | Status: DC | PRN
  Administered 2016-12-11 – 2016-12-15 (×4): 500 mg via ORAL
  Filled 2016-12-10 (×5): qty 1

## 2016-12-10 MED ORDER — DIPHENHYDRAMINE HCL 12.5 MG/5ML PO ELIX
12.5000 mg | ORAL_SOLUTION | Freq: Four times a day (QID) | ORAL | Status: DC | PRN
  Administered 2016-12-14 – 2016-12-15 (×2): 25 mg via ORAL
  Filled 2016-12-10 (×2): qty 10

## 2016-12-10 MED ORDER — CYANOCOBALAMIN 1000 MCG/ML IJ SOLN
1000.0000 ug | Freq: Every day | INTRAMUSCULAR | Status: AC
  Administered 2016-12-11: 1000 ug via INTRAMUSCULAR
  Filled 2016-12-10 (×2): qty 1

## 2016-12-10 MED ORDER — PROCHLORPERAZINE MALEATE 5 MG PO TABS: 5.0000 mg | ORAL_TABLET | Freq: Four times a day (QID) | ORAL | Status: DC | PRN

## 2016-12-10 MED ORDER — TOPIRAMATE 25 MG PO TABS
50.0000 mg | ORAL_TABLET | Freq: Every day | ORAL | Status: DC
  Administered 2016-12-11 – 2016-12-13 (×3): 50 mg via ORAL
  Filled 2016-12-10 (×2): qty 2

## 2016-12-10 MED ORDER — ASPIRIN 325 MG PO TABS
325.0000 mg | ORAL_TABLET | Freq: Every day | ORAL | Status: DC
  Administered 2016-12-11 – 2017-01-14 (×35): 325 mg via ORAL
  Filled 2016-12-10 (×35): qty 1

## 2016-12-10 MED ORDER — ENOXAPARIN SODIUM 40 MG/0.4ML ~~LOC~~ SOLN
40.0000 mg | SUBCUTANEOUS | Status: DC
  Administered 2016-12-10 – 2016-12-26 (×17): 40 mg via SUBCUTANEOUS
  Filled 2016-12-10 (×17): qty 0.4

## 2016-12-10 MED ORDER — BISACODYL 10 MG RE SUPP: 10.0000 mg | Freq: Every day | RECTAL | Status: DC | PRN

## 2016-12-10 MED ORDER — PROCHLORPERAZINE 25 MG RE SUPP
12.5000 mg | Freq: Four times a day (QID) | RECTAL | Status: DC | PRN
  Filled 2016-12-10: qty 1

## 2016-12-10 MED ORDER — FLEET ENEMA 7-19 GM/118ML RE ENEM: 1.0000 | ENEMA | Freq: Once | RECTAL | Status: DC | PRN

## 2016-12-10 MED ORDER — TRAMADOL HCL 50 MG PO TABS
50.0000 mg | ORAL_TABLET | Freq: Four times a day (QID) | ORAL | Status: DC | PRN
  Administered 2016-12-11 – 2017-01-02 (×3): 50 mg via ORAL
  Filled 2016-12-10 (×5): qty 1

## 2016-12-10 MED ORDER — ALUM & MAG HYDROXIDE-SIMETH 200-200-20 MG/5ML PO SUSP: 30.0000 mL | ORAL | Status: DC | PRN

## 2016-12-10 MED ORDER — ACETAMINOPHEN 325 MG PO TABS
325.0000 mg | ORAL_TABLET | ORAL | Status: DC | PRN
  Administered 2016-12-10 – 2016-12-12 (×2): 650 mg via ORAL
  Filled 2016-12-10 (×2): qty 2

## 2016-12-10 MED ORDER — TRAZODONE HCL 50 MG PO TABS
25.0000 mg | ORAL_TABLET | Freq: Every evening | ORAL | Status: DC | PRN
  Administered 2016-12-10: 50 mg via ORAL
  Filled 2016-12-10: qty 1

## 2016-12-10 MED ORDER — CLOPIDOGREL BISULFATE 75 MG PO TABS
75.0000 mg | ORAL_TABLET | Freq: Every day | ORAL | Status: DC
  Administered 2016-12-11 – 2017-01-14 (×35): 75 mg via ORAL
  Filled 2016-12-10 (×36): qty 1

## 2016-12-10 MED ORDER — POLYETHYLENE GLYCOL 3350 17 G PO PACK
17.0000 g | PACK | Freq: Every day | ORAL | Status: DC | PRN
  Filled 2016-12-10 (×2): qty 1

## 2016-12-10 MED ORDER — PROCHLORPERAZINE EDISYLATE 5 MG/ML IJ SOLN: 5.0000 mg | Freq: Four times a day (QID) | INTRAMUSCULAR | Status: DC | PRN

## 2016-12-10 MED ORDER — GUAIFENESIN-DM 100-10 MG/5ML PO SYRP: 5.0000 mL | ORAL_SOLUTION | Freq: Four times a day (QID) | ORAL | Status: DC | PRN

## 2016-12-10 MED ORDER — ATORVASTATIN CALCIUM 80 MG PO TABS
80.0000 mg | ORAL_TABLET | Freq: Every day | ORAL | Status: DC
  Administered 2016-12-10 – 2017-01-13 (×35): 80 mg via ORAL
  Filled 2016-12-10 (×35): qty 1

## 2016-12-10 NOTE — Discharge Summary (Signed)
Physician Discharge Summary   Patient ID: Shannon Erickson MRN: [id number] DOB/AGE: 1976/04/20 41 y.o.  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 PT evaluation recommended inpatient rehabilitation 2. Please repeat CBC/BMET at next visit   DIET: Dysphagia 3 nectar thick diet    Allergies:   Allergies  Allergen Reactions  . Penicillins Other (See Comments)    unknown     DISCHARGE MEDICATIONS: Current Discharge Medication List    CONTINUE these medications which have NOT CHANGED   Details  aspirin-acetaminophen-caffeine (EXCEDRIN MIGRAINE) 250-250-65 MG tablet Take 1 tablet by mouth every 6 (six) hours as needed for headache.       Please continue following medications, patient will need prescriptions outpatient upon discharge  Aspirin 325 mg daily Lipitor 80 mg daily at bedtime Plavix 75 mg daily Topamax 50 mg daily    Brief H and P: For complete details please refer to admission H and P, but in brief Patient is a 41 year old female with history of migraines presented with severe headache and altered mental status. Patient presented to ED and had further complaints of left-sided jerking and weakness. MRI revealed multiple embolic strokes.   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 MRA with suspected right MCA M1 stenosis or occlusion. Per Neurology, most likely embolic from right ICA soft plaque. However due to high beta 2 glycoprotein IgG, antiphospholipid syndrome cannot be ruled out at this time - Patient presented with left facial  droop and left hemiparesis - CT angiogram head and neck with the proximal right M1 occlusion - 2-D echo showed EF of 60-65% with no source of emboli - TEE unremarkable negative for PFO or ASO or endocarditis - Lower extremity Dopplers were negative for DVT - Hypercoagulability workup: Anti-dsDNA antibody negative, anticardiolipin IgM slightly elevated 13 (12 cutoff), beta 2 glycoprotein IgG elevated 109, protein C,S stable - Lipid panel showed LDL of 138 - Endocarditis ruled out, patient was placed on aspirin 325 mg daily, Plavix, statin - TCD's showed no PFO, no MES. CTA neck concerning for right ICA soft plaque as well as left ICA with mild soft plaque - EEG showed right hemispheric slowing, diffuse slowing of the waking background - CT head with evolving acute large right MCA territory infarct without hemorrhagic conversion increasing 5 mm right-to-left midline shift. Repeat CT head 7/19 showed stable shift - Patient accepted to inpatient rehabilitation    Hyperlipidemia LDL 138, continue Lipitor 80 mg daily Goal less than 70 LDL   Chronic migraine without aura without status migrainosus, not intractable - Currently stable, continue Topamax, will need prescription upon discharge      Thyroid nodule - TSH within normal limits, will need thyroid ultrasound on follow-up in the outpatient setting     Dysphagia, post-stroke - Continue dysphagia 3 diet thin liquids, may need SLP evaluation to upgrade diet   Day of Discharge BP 125/61 (BP Location: Left Arm)   Pulse 60   Temp 97.6 F (36.4 C) (Oral)   Resp 16   Ht 5\' 3"  (1.6 m)   Wt 89.5 kg (197 lb 5 oz)   LMP 11/05/2016 (Within  Days)   SpO2 98%   BMI 34.95 kg/m   Physical Exam: General: Alert and awake oriented x3 not in any acute distress. HEENT: anicteric sclera, pupils reactive to light and accommodation CVS: S1-S2 clear no murmur rubs or gallops Chest: clear to auscultation bilaterally, no wheezing rales or  rhonchi Abdomen: soft nontender, nondistended, normal bowel sounds Extremities: no cyanosis, clubbing or edema noted bilaterally Neuroleft-sided hemiparesis   The results of significant diagnostics from this hospitalization (including imaging, microbiology, ancillary and laboratory) are listed below for reference.    LAB RESULTS: Basic Metabolic Panel:  Recent Labs Lab 12/05/16 1516 12/06/16 0903 12/07/16 0415  NA 137 137 135  K 3.6 3.8 3.8  CL 107 110 111  CO2 22 21* 17*  GLUCOSE 88 108* 105*  BUN 17 12 11   CREATININE 0.90 0.81 0.76  CALCIUM 8.1* 8.2* 8.0*  MG 1.8  --   --    Liver Function Tests:  Recent Labs Lab 12/04/16 0419  AST 25  ALT 47  ALKPHOS 95  BILITOT 0.7  PROT 6.4*  ALBUMIN 2.7*   No results for input(s): LIPASE, AMYLASE in the last 168 hours. No results for input(s): AMMONIA in the last 168 hours. CBC:  Recent Labs Lab 12/05/16 1516 12/07/16 0415  WBC 8.9 6.7  NEUTROABS 5.9  --   HGB 13.5 12.6  HCT 42.1 39.4  MCV 85.9 84.7  PLT 251 240   Cardiac Enzymes: No results for input(s): CKTOTAL, CKMB, CKMBINDEX, TROPONINI in the last 168 hours. BNP: Invalid input(s): POCBNP CBG: No results for input(s): GLUCAP in the last 168 hours.  Significant Diagnostic Studies:  Ct Angio Head W Or Wo Contrast  Result Date: 12/03/2016 CLINICAL DATA:  Right MCA territory infarct.  Migraine headaches. EXAM: CT ANGIOGRAPHY HEAD AND NECK TECHNIQUE: Multidetector CT imaging of the head and neck was performed using the standard protocol during bolus administration of intravenous contrast. Multiplanar CT image reconstructions and MIPs were obtained to evaluate the vascular anatomy. Carotid stenosis measurements (when applicable) are obtained utilizing NASCET criteria, using the distal internal carotid diameter as the denominator. CONTRAST:  50 mL Isovue 370 was administered into the right antecubital fossa with contrast extravasation. Contrast extravasation  consultation: Type of contrast: Isovue 370 site of extravasation: Right antecubital fossa Estimated volume of extravasation: <50 ml Area of extravasation scanned with CT? no PATIENT'S SIGNS AND SYMPTOMS Skin blistering/ulceration: no Decrease capillary refill: no Change in skin color: no Decreased motor function or severe tightness: Unable to assess due to stroke Decreased pulses distal to site of extravasation: no Altered sensation: Unable to assess due to stroke Increasing pain or signs of increased swelling during observation: no TREATMENT Limb elevation: yes Ice packs applied: yes Heat pads applied: yes Plastic surgery consulted? no DOCUMENTATION AND FOLLOW-UP Site contrast extravasation forms submitted? yes Post extravasation orders completed? yes Was additional follow up assigned to PA's? Yes COMPARISON:  MRI brain from the same day at 11:55 a.m. CT head without contrast at 3:08 a.m. FINDINGS: CT HEAD FINDINGS Brain: Hypoattenuation involving the right temporal lobe, insular cortex, and basal ganglia matches the diffusion abnormality. There is effacement of the sulci. Midline shift is 2 mm. There is no hemorrhage. A hyperdense right MCA is noted. No new infarcts are present. Vascular: Hyperdense right MCA. Skull: The calvarium is intact. No focal lytic or blastic lesions are present. Sinuses: Minimal mucosal thickening is present in the left maxillary sinus. The remaining paranasal sinuses and the mastoid air cells are  clear. Orbits: The globes and orbits are within normal limits. Review of the MIP images confirms the above findings CTA NECK FINDINGS Aortic arch: There is a common origin of the left common carotid artery and the innominate artery. There is no focal stenosis of the great vessel origins at the aorta. No significant vascular calcifications are present at the aorta. Right carotid system: The right common carotid artery is within normal limits. Soft tissue plaque is noted along the posterior  proximal right internal carotid artery at the bifurcation. There is a shelf-like plaque. The lumen is narrowed to 3 mm maximally. The more distal right internal carotid artery is within normal limits. Left carotid system: The left common carotid artery is within normal limits. Atherosclerotic changes are noted at the bifurcation. The cervical left internal carotid artery is normal. Vertebral arteries: The vertebral arteries both originate from the subclavian arteries without significant stenoses. Both vertebral arteries are small without significant focal stenosis in the neck. Skeleton: Age advanced degenerative changes are noted at C5-6 and C6-7. Left greater than right uncovertebral spurring is present at C4-5. Vertebral body heights are maintained. No focal lytic or blastic lesions are present. Other neck: 11 mm inferior thyroid nodule is present on the left. No other discrete thyroid lesions are present. There is no significant adenopathy. No focal mucosal lesions are evident. The salivary glands are within normal limits. Upper chest: Ground-glass attenuation of the lung apices bilaterally likely reflects edema or atelectasis. No focal nodule or mass lesion is present. Review of the MIP images confirms the above findings CTA HEAD FINDINGS Anterior circulation: Internal carotid artery's are within normal limits through the ICA termini bilaterally. The A1 segments are patent. Anterior communicating artery is patent. There is a proximal occlusion of the right M1 segment. The left M1 segment is within normal limits. The left MCA bifurcation is unremarkable. Left MCA and right ACA branch vessels are within normal limits. Anterior and superior right MCA branch vessels perfuse. Posterior and inferior branch vessels are occluded. Posterior circulation: The vertebral arteries are small. PICA origins are visualized and is normal. The basilar artery is small, terminating at the superior cerebellar artery is. Fetal type  posterior cerebral arteries are present bilaterally. The PCA branch vessels are within normal limits. Venous sinuses: The dural sinuses are patent. The right transverse sinus is dominant. The straight sinus deep cerebral veins are intact. Cortical veins are within normal limits. Anatomic variants: Bilateral fetal type posterior cerebral artery is. Delayed phase: The infarct is again noted. No pathologic enhancement is evident. Review of the MIP images confirms the above findings IMPRESSION: 1. Proximal right M1 conclusion. 2. Expected evolution of right temporal lobe, insular cortex, and basal ganglia nonhemorrhagic infarction. 3. Focal irregularity at the right carotid bifurcation with a shelf-like soft tissue plaque but no significant stenosis relative to the more distal vessel. 4. More mild atherosclerotic changes at the left carotid bifurcation. 5. Fetal type posterior cerebral artery is bilaterally. 6. 11 mm thyroid nodule. Consider further evaluation with thyroid ultrasound. If patient is clinically hyperthyroid, consider nuclear medicine thyroid uptake and scan. Thyroid ultrasound can be done following discharge. These results were called by telephone at the time of interpretation on 12/03/2016 at 9:00 pm to Dr. Noel Christmas, who verbally acknowledged these results. Electronically Signed   By: Marin Roberts M.D.   On: 12/03/2016 21:15   Ct Angio Neck W Or Wo Contrast  Result Date: 12/03/2016 CLINICAL DATA:  Right MCA territory infarct.  Migraine headaches.  EXAM: CT ANGIOGRAPHY HEAD AND NECK TECHNIQUE: Multidetector CT imaging of the head and neck was performed using the standard protocol during bolus administration of intravenous contrast. Multiplanar CT image reconstructions and MIPs were obtained to evaluate the vascular anatomy. Carotid stenosis measurements (when applicable) are obtained utilizing NASCET criteria, using the distal internal carotid diameter as the denominator. CONTRAST:  50 mL  Isovue 370 was administered into the right antecubital fossa with contrast extravasation. Contrast extravasation consultation: Type of contrast: Isovue 370 site of extravasation: Right antecubital fossa Estimated volume of extravasation: <50 ml Area of extravasation scanned with CT? no PATIENT'S SIGNS AND SYMPTOMS Skin blistering/ulceration: no Decrease capillary refill: no Change in skin color: no Decreased motor function or severe tightness: Unable to assess due to stroke Decreased pulses distal to site of extravasation: no Altered sensation: Unable to assess due to stroke Increasing pain or signs of increased swelling during observation: no TREATMENT Limb elevation: yes Ice packs applied: yes Heat pads applied: yes Plastic surgery consulted? no DOCUMENTATION AND FOLLOW-UP Site contrast extravasation forms submitted? yes Post extravasation orders completed? yes Was additional follow up assigned to PA's? Yes COMPARISON:  MRI brain from the same day at 11:55 a.m. CT head without contrast at 3:08 a.m. FINDINGS: CT HEAD FINDINGS Brain: Hypoattenuation involving the right temporal lobe, insular cortex, and basal ganglia matches the diffusion abnormality. There is effacement of the sulci. Midline shift is 2 mm. There is no hemorrhage. A hyperdense right MCA is noted. No new infarcts are present. Vascular: Hyperdense right MCA. Skull: The calvarium is intact. No focal lytic or blastic lesions are present. Sinuses: Minimal mucosal thickening is present in the left maxillary sinus. The remaining paranasal sinuses and the mastoid air cells are clear. Orbits: The globes and orbits are within normal limits. Review of the MIP images confirms the above findings CTA NECK FINDINGS Aortic arch: There is a common origin of the left common carotid artery and the innominate artery. There is no focal stenosis of the great vessel origins at the aorta. No significant vascular calcifications are present at the aorta. Right carotid  system: The right common carotid artery is within normal limits. Soft tissue plaque is noted along the posterior proximal right internal carotid artery at the bifurcation. There is a shelf-like plaque. The lumen is narrowed to 3 mm maximally. The more distal right internal carotid artery is within normal limits. Left carotid system: The left common carotid artery is within normal limits. Atherosclerotic changes are noted at the bifurcation. The cervical left internal carotid artery is normal. Vertebral arteries: The vertebral arteries both originate from the subclavian arteries without significant stenoses. Both vertebral arteries are small without significant focal stenosis in the neck. Skeleton: Age advanced degenerative changes are noted at C5-6 and C6-7. Left greater than right uncovertebral spurring is present at C4-5. Vertebral body heights are maintained. No focal lytic or blastic lesions are present. Other neck: 11 mm inferior thyroid nodule is present on the left. No other discrete thyroid lesions are present. There is no significant adenopathy. No focal mucosal lesions are evident. The salivary glands are within normal limits. Upper chest: Ground-glass attenuation of the lung apices bilaterally likely reflects edema or atelectasis. No focal nodule or mass lesion is present. Review of the MIP images confirms the above findings CTA HEAD FINDINGS Anterior circulation: Internal carotid artery's are within normal limits through the ICA termini bilaterally. The A1 segments are patent. Anterior communicating artery is patent. There is a proximal occlusion of the  right M1 segment. The left M1 segment is within normal limits. The left MCA bifurcation is unremarkable. Left MCA and right ACA branch vessels are within normal limits. Anterior and superior right MCA branch vessels perfuse. Posterior and inferior branch vessels are occluded. Posterior circulation: The vertebral arteries are small. PICA origins are  visualized and is normal. The basilar artery is small, terminating at the superior cerebellar artery is. Fetal type posterior cerebral arteries are present bilaterally. The PCA branch vessels are within normal limits. Venous sinuses: The dural sinuses are patent. The right transverse sinus is dominant. The straight sinus deep cerebral veins are intact. Cortical veins are within normal limits. Anatomic variants: Bilateral fetal type posterior cerebral artery is. Delayed phase: The infarct is again noted. No pathologic enhancement is evident. Review of the MIP images confirms the above findings IMPRESSION: 1. Proximal right M1 conclusion. 2. Expected evolution of right temporal lobe, insular cortex, and basal ganglia nonhemorrhagic infarction. 3. Focal irregularity at the right carotid bifurcation with a shelf-like soft tissue plaque but no significant stenosis relative to the more distal vessel. 4. More mild atherosclerotic changes at the left carotid bifurcation. 5. Fetal type posterior cerebral artery is bilaterally. 6. 11 mm thyroid nodule. Consider further evaluation with thyroid ultrasound. If patient is clinically hyperthyroid, consider nuclear medicine thyroid uptake and scan. Thyroid ultrasound can be done following discharge. These results were called by telephone at the time of interpretation on 12/03/2016 at 9:00 pm to Dr. Noel Christmas, who verbally acknowledged these results. Electronically Signed   By: Marin Roberts M.D.   On: 12/03/2016 21:15   Mr Brain Wo Contrast  Result Date: 12/03/2016 CLINICAL DATA:  Slurred speech and LEFT-sided weakness beginning earlier today. EXAM: MRI HEAD WITHOUT CONTRAST TECHNIQUE: Multiplanar, multiecho pulse sequences of the brain and surrounding structures were obtained without intravenous contrast. COMPARISON:  Noncontrast CT head earlier today. FINDINGS: Significant motion degradation. The study is diagnostic for the clinical indication, but small or subtle  lesions could be overlooked. Brain: Large area of restricted diffusion, RIGHT MCA territory, involves the temporal lobe, small portion of the frontal lobe, insula, and basal ganglia, as well as the regional white matter. Findings consistent with acute cerebral infarction. This is not visible on prior CT. Early mass effect RIGHT to LEFT of 1-2 mm. Gradient sequence shows significant motion degradation, pre occluding definitive excess but of hemorrhage. No gross lobar hemorrhage. No mass lesion, hydrocephalus, or extra-axial fluid. Vascular: Suspected RIGHT MCA M1 stenosis or occlusion. This is difficult to confirm due to motion degradation. Skull and upper cervical spine: Normal marrow signal. Sinuses/Orbits: Negative. Other: None. IMPRESSION: Motion degraded exam. Large RIGHT MCA territory nonhemorrhagic infarct as described. There is early mass effect RIGHT-to-LEFT of 1-2 mm. Close follow-up recommended. Within limits for detection on this motion degraded exam, there is no gross hemorrhage, although small areas could be overlooked. Consider CT follow-up. Suspected RIGHT MCA M1 stenosis or occlusion. This could be further assessed with CT angiography of the head and neck, but only after the patient is more cooperative. These results were called by telephone at the time of interpretation on 12/03/2016 at 1:04 pm to Dr. Wilford Corner, who verbally acknowledged these results. Electronically Signed   By: Elsie Stain M.D.   On: 12/03/2016 13:05   Dg Swallowing Func-speech Pathology  Result Date: 12/03/2016 Objective Swallowing Evaluation: Type of Study: MBS-Modified Barium Swallow Study Patient Details Name: Sayana Salley MRN: [id number] Date of Birth: 1976/03/02 Today's Date: 12/03/2016 Time: SLP Start Time (  ACUTE ONLY): 1238-SLP Stop Time (ACUTE ONLY): 1252 SLP Time Calculation (min) (ACUTE ONLY): 14 min Past Medical History: Past Medical History: Diagnosis Date . Migraine  Past Surgical History: Past Surgical History:  Procedure Laterality Date . CESAREAN SECTION Bilateral 1995, 2002 HPI: 41 y.o. female w hx of migraines was at home around 11pm c/o headache and then had altered mental status; BSE completed and pt kept NPO d/t overt s/s of aspiration including delayed throat clearing/cough with thin liquids; anterior left loss; CT negative; MRI pending No Data Recorded Assessment / Plan / Recommendation CHL IP CLINICAL IMPRESSIONS 12/03/2016 Clinical Impression Pt presents with mild-moderate oropharyngeal dysphagia characterized by premature spillage to pyriform sinuses prior to swallow triggering d/t sensory impairment; anterior loss noted with liquids either via tsp and/or cup; chin tuck and head turn left attempted with larger volumes of thin and nectar-thickened liquids, but was inconsistent d/t pt lethargy, subsequently, larger volumes of thin entered laryngeal vestibule with cup sips and to level of the vocal folds with straw sips and ejected out; no penetration and/or aspiration noted with nectar-thickened liquids via cup/straw, but pt is at risk with larger amounts; mechanical/soft consistency attempted, but removed from oral cavity d/t impaired mastication/poor lingual control/manipulation paired with pt lethargy; recommend Dysphagia 1 (puree) with nectar-thickened liquids via individual straw sips to reduce anterior loss presented on right side pending pt alertness level and increased appetite with swallowing precautions in place during PO intake SLP Visit Diagnosis Dysphagia, oropharyngeal phase (R13.12) Attention and concentration deficit following -- Frontal lobe and executive function deficit following -- Impact on safety and function Moderate aspiration risk   CH[facility] IP TREATMENT RECOMMENDATION 12/03/2016 Treatment Recommendations Therapy as outlined in treatment plan below   Prognosis 12/03/2016 Prognosis for Safe Diet Advancement Good Barriers to Reach Goals -- Barriers/Prognosis Comment -- CHL IP DIET RECOMMENDATION  12/03/2016 SLP Diet Recommendations Dysphagia 1 (Puree) solids;Nectar thick liquid Liquid Administration via Cup;Straw;Other (Comment) Medication Administration Crushed with puree Compensations Slow rate;Small sips/bites;Lingual sweep for clearance of pocketing;Monitor for anterior loss Postural Changes --   CHL IP OTHER RECOMMENDATIONS 12/03/2016 Recommended Consults -- Oral Care Recommendations Oral care BID Other Recommendations Order thickener from pharmacy   CHL IP FOLLOW UP RECOMMENDATIONS 12/03/2016 Follow up Recommendations Other (comment)   CHL IP FREQUENCY AND DURATION 12/03/2016 Speech Therapy Frequency (ACUTE ONLY) min 2x/week Treatment Duration 1 week      CHL IP ORAL PHASE 12/03/2016 Oral Phase Impaired Oral - Pudding Teaspoon -- Oral - Pudding Cup -- Oral - Honey Teaspoon -- Oral - Honey Cup -- Oral - Nectar Teaspoon Left anterior bolus loss;Premature spillage Oral - Nectar Cup -- Oral - Nectar Straw Premature spillage Oral - Thin Teaspoon Left anterior bolus loss;Premature spillage Oral - Thin Cup Left anterior bolus loss;Premature spillage Oral - Thin Straw Premature spillage Oral - Puree Premature spillage Oral - Mech Soft -- Oral - Regular -- Oral - Multi-Consistency -- Oral - Pill -- Oral Phase - Comment (No Data)  CHL IP PHARYNGEAL PHASE 12/03/2016 Pharyngeal Phase Impaired Pharyngeal- Pudding Teaspoon -- Pharyngeal -- Pharyngeal- Pudding Cup -- Pharyngeal -- Pharyngeal- Honey Teaspoon -- Pharyngeal -- Pharyngeal- Honey Cup -- Pharyngeal -- Pharyngeal- Nectar Teaspoon Delayed swallow initiation-pyriform sinuses Pharyngeal Material does not enter airway Pharyngeal- Nectar Cup Delayed swallow initiation-pyriform sinuses Pharyngeal Material does not enter airway Pharyngeal- Nectar Straw Delayed swallow initiation-pyriform sinuses Pharyngeal Material does not enter airway Pharyngeal- Thin Teaspoon Delayed swallow initiation-pyriform sinuses Pharyngeal Material enters airway, remains ABOVE vocal cords then  ejected  out Pharyngeal- Thin Cup Delayed swallow initiation-pyriform sinuses;Compensatory strategies attempted (chin tuck; head turn left) Pharyngeal Material enters airway, remains ABOVE vocal cords then ejected out Pharyngeal- Thin Straw Delayed swallow initiation-pyriform sinuses;Penetration/Aspiration before swallow;Trace aspiration;Compensatory strategies attempted (chin tuck/head turn left) Pharyngeal Material enters airway, CONTACTS cords and then ejected out Pharyngeal- Puree Delayed swallow initiation-vallecula Pharyngeal -- Pharyngeal- Mechanical Soft NT Pharyngeal -- Pharyngeal- Regular -- Pharyngeal -- Pharyngeal- Multi-consistency -- Pharyngeal -- Pharyngeal- Pill -- Pharyngeal -- Pharyngeal Comment (No Data)  CHL IP CERVICAL ESOPHAGEAL PHASE 12/03/2016 Cervical Esophageal Phase WFL Pudding Teaspoon -- Pudding Cup -- Honey Teaspoon -- Honey Cup -- Nectar Teaspoon -- Nectar Cup -- Nectar Straw -- Thin Teaspoon -- Thin Cup -- Thin Straw -- Puree -- Mechanical Soft -- Regular -- Multi-consistency -- Pill -- Cervical Esophageal Comment -- CHL IP GO 12/03/2016 Functional Assessment Tool Used NOMS; clinical judgment Functional Limitations Swallowing Swallow Current Status (Z6109) CK Swallow Goal Status (U0454) CJ Swallow Discharge Status (U9811) (None) Motor Speech Current Status (B1478) (None) Motor Speech Goal Status (G9562) (None) Motor Speech Goal Status (Z3086) (None) Spoken Language Comprehension Current Status (V7846) (None) Spoken Language Comprehension Goal Status (N6295) (None) Spoken Language Comprehension Discharge Status (M8413) (None) Spoken Language Expression Current Status (K4401) (None) Spoken Language Expression Goal Status (U2725) (None) Spoken Language Expression Discharge Status [phone]) (None) Attention Current Status (I3474) (None) Attention Goal Status (Q5956) (None) Attention Discharge Status (L8756) (None) Memory Current Status (E3329) (None) Memory Goal Status (J1884) (None) Memory  Discharge Status (Z6606) (None) Voice Current Status (T0160) (None) Voice Goal Status (F0932) (None) Voice Discharge Status (T5573) (None) Other Speech-Language Pathology Functional Limitation Current Status (U2025) (None) Other Speech-Language Pathology Functional Limitation Goal Status (K2706) (None) Other Speech-Language Pathology Functional Limitation Discharge Status [phone]) (None) Tressie Stalker, M.S., CCC-SLP 12/03/2016, 1:18 PM              Ct Head Code Stroke W/o Cm  Result Date: 12/03/2016 CLINICAL DATA:  Code stroke. Slurred speech and weakness. Last seen normal at 0200 hours. EXAM: CT HEAD WITHOUT CONTRAST TECHNIQUE: Contiguous axial images were obtained from the base of the skull through the vertex without intravenous contrast. COMPARISON:  None. FINDINGS: Mild motion degraded examination. BRAIN: No intraparenchymal hemorrhage, mass effect nor midline shift. The ventricles and sulci are normal. No acute large vascular territory infarcts. No abnormal extra-axial fluid collections. Basal cisterns are patent. VASCULAR: Unremarkable. SKULL/SOFT TISSUES: No skull fracture. No significant soft tissue swelling. ORBITS/SINUSES: The included ocular globes and orbital contents are normal.The mastoid aircells and included paranasal sinuses are well-aerated. Dehiscent RIGHT jugular bulb. OTHER: None. ASPECTS [facility] Stroke Program Early CT Score) - Ganglionic level infarction (caudate, lentiform nuclei, internal capsule, insula, M1-M3 cortex): 7 - Supraganglionic infarction (M4-M6 cortex): 3 Total score (0-10 with 10 being normal): 10 IMPRESSION: 1. Negative mildly motion degraded noncontrast CT HEAD. 2. ASPECTS is 10. 3. Critical Value/emergent results were called by telephone at the time of interpretation on 12/03/2016 at 3:15 am to Dr. Nicholas Lose, Neurology, who verbally acknowledged these results. Electronically Signed   By: Awilda Metro M.D.   On: 12/03/2016 03:17    2D ECHO: Study Conclusions  - Left  ventricle: The cavity size was normal. Systolic function was   normal. The estimated ejection fraction was in the range of 60%   to 65%. Wall motion was normal; there were no regional wall   motion abnormalities. Left ventricular diastolic function   parameters were normal. - Mitral valve: There was trivial regurgitation. - Pulmonic  valve: There was no regurgitation.   Disposition and Follow-up: Discharge Instructions    Ambulatory referral to Neurology    Complete by:  As directed    Pt will follow up with Dr. Roda Shutters at [facility] in about 2 months. Thanks.       DISPOSITION: CIR    DISCHARGE FOLLOW-UP Follow-up Information    Marvel Plan, MD. Schedule an appointment as soon as possible for a visit in 6 week(s).   Specialty:  Neurology Contact information: [address] [place] [address] [phone]            Time spent on Discharge: 35 mins   Signed:   Thad Ranger M.D. [facility] 12/10/2016, 11:23 AM Pager: [phone]

## 2016-12-10 NOTE — H&P (Signed)
Physical Medicine and Rehabilitation Admission H&P        Chief Complaint  Patient presents with  . Left sided weakness, left inattention,dysphagia and cognitive linguistic deficits.       HPI:  Shannon Erickson is a 41 year old female with history of meningitis, migraines who was admitted on 12/03/16 with severe right frontoparietal HA, slurred speech, lethargy and left sided weakness. CT head negative. MRI brain done revealing "Large RIGHT MCA territory nonhemorrhagic infarct involving temporal lobe, frontal lobe, insula, basal ganglia and regional white matter with early mass effect and 1-2 mm. EEG done showing right hemispheric slowing with diffuse cerebral dysfunction due to encephalopathy.  BLE dopplers were negative for DVT. TEE done revealing EF 50% with no thrombus, no PFO or ASD.  CTA neck showed focal irregularity at right carotid bifurcation with self like plaque and 11 mm thyroid nodule. Stroke felt to be embolic from R-ICA plaque and Dr. Roda ShuttersXu recommended ASA/Plavix X 3 months followed by Plavix alone. Follow up CT head 7/19 stable and lethargy resolving with improvement in headaches.  Coagulopathy panel with high beta 2 glycoprotein IgG and will need repeat test in 12 weeks due question of antiphospholipid syndrome.  Patient with resultant left sided weakness, left inattention, delayed processing with cognitive deficits and dysphagia. CIR recommended by rehab team.      Review of Systems  Constitutional: Negative for fever.  HENT: Negative for hearing loss and tinnitus.   Eyes: Negative for blurred vision and double vision.  Respiratory: Negative for cough, sputum production and shortness of breath.   Cardiovascular: Negative for chest pain, palpitations and leg swelling.  Gastrointestinal: Negative for constipation and nausea.  Genitourinary: Negative for dysuria and urgency.  Musculoskeletal: Negative for back pain and myalgias.  Skin: Negative for rash.  Neurological: Positive  for sensory change, speech change and focal weakness. Negative for dizziness, weakness and headaches.  Psychiatric/Behavioral: Negative for memory loss. The patient does not have insomnia.             Past Medical History:  Diagnosis Date  . Lesion of left ulnar nerve    . Meningitis    . Migraine             Past Surgical History:  Procedure Laterality Date  . CESAREAN SECTION Bilateral 1995, 2002  . TEE WITHOUT CARDIOVERSION N/A 12/05/2016    Procedure: TRANSESOPHAGEAL ECHOCARDIOGRAM (TEE);  Surgeon: Elease HashimotoNahser, Deloris PingPhilip J, MD;  [facility];  Service: Cardiovascular;  Laterality: N/A;           Family History  Problem Relation Age of Onset  . Lung cancer Mother          mets to brain, bone, liver  . Hypertension Father    . Heart attack Brother          at age 142  . Renal cancer Maternal Grandfather    . Heart attack Paternal Grandfather          Social History:  reports that she has never smoked. She has never used smokeless tobacco. She reports that she does not drink alcohol. Her drug history is not on file.           Allergies  Allergen Reactions  . Penicillins Other (See Comments)      unknown            Medications Prior to Admission  Medication Sig Dispense Refill  . aspirin-acetaminophen-caffeine (EXCEDRIN MIGRAINE) 250-250-65 MG tablet Take 1 tablet by mouth  every 6 (six) hours as needed for headache.          Home: Home Living Family/patient expects to be discharged to:: Private residence Living Arrangements: Children, Other relatives Available Help at Discharge: Family Type of Home: House Home Access: Stairs to enter Secretary/administrator of Steps: 3 Entrance Stairs-Rails: None Home Layout: Two level, Able to live on main level with bedroom/bathroom Alternate Level Stairs-Number of Steps: flight Home Equipment: None   Functional History: Prior Function Level of Independence: Independent Comments: works full time, very independent    Functional Status:  Mobility: Bed Mobility Overal bed mobility: Needs Assistance Bed Mobility: Rolling, Sidelying to Sit Rolling: Min assist Sidelying to sit: Min assist Supine to sit: Mod assist General bed mobility comments: pt required increased time, verbal and tactile cueing for sequencing, use of bed rails, assist to bring bilateral LEs off of bed. Pt able to use R UE to elevate trunk and achieve sitting EOB Transfers Overall transfer level: Needs assistance Equipment used: 2 person hand held assist Transfers: Sit to/from Stand, Stand Pivot Transfers Sit to Stand: Mod assist, +2 safety/equipment, +2 physical assistance (one with gait belt on R side, one assisting at L LE) Stand pivot transfers: Max assist, +2 physical assistance General transfer comment: pt unable to achieve L knee extension or L foot in complete contact with the floor even with physical assistance secondary to increased flexor tone.    ADL: ADL Overall ADL's : Needs assistance/impaired Grooming: Moderate assistance, Sitting Upper Body Bathing: Moderate assistance, Sitting Lower Body Bathing: Maximal assistance, Sit to/from stand, +2 for physical assistance Upper Body Dressing : Moderate assistance, Sitting Lower Body Dressing: Maximal assistance, Sit to/from stand, +2 for physical assistance Toilet Transfer: +2 for physical assistance, Stand-pivot, Maximal assistance Toileting- Clothing Manipulation and Hygiene: Maximal assistance, +2 for physical assistance, Sit to/from stand Functional mobility during ADLs: Moderate assistance, +2 for physical assistance (stand-pivot only) General ADL Comments: Max assist +2 to maintain balance during pericare. Upon standing for simulated toilet transfer, pt with incontinence of bowel.    Cognition: Cognition Overall Cognitive Status: Impaired/Different from baseline Arousal/Alertness: Awake/alert Orientation Level: Oriented X4 Attention: Sustained, Selective Sustained  Attention: Appears intact Selective Attention: Impaired Selective Attention Impairment: Functional basic Memory: Impaired Memory Impairment: Retrieval deficit Problem Solving: Impaired Safety/Judgment: Appears intact Cognition Arousal/Alertness: Awake/alert Behavior During Therapy: Flat affect Overall Cognitive Status: Impaired/Different from baseline Area of Impairment: Awareness, Problem solving Current Attention Level: Sustained Following Commands: Follows one step commands consistently Awareness: Emergent Problem Solving: Requires tactile cues, Requires verbal cues, Slow processing General Comments: pt continues to have L inattention of body and environment. Pt required frequent multimodal cueing to attend to L.   Physical Exam: Blood pressure 109/65, pulse 65, temperature 97.6 F (36.4 C), temperature source Oral, resp. rate 20, height 5\' 3"  (1.6 m), weight 89.5 kg (197 lb 5 oz), last menstrual period 11/05/2016, SpO2 97 %. Physical Exam  Nursing note and vitals reviewed. Constitutional: She is oriented to person, place, and time. She appears well-developed and well-nourished. She appears lethargic. She is easily aroused. No distress.  HENT:  Head: Normocephalic and atraumatic.  Eyes: Pupils are equal, round, and reactive to light. Conjunctivae and EOM are normal.  Neck: Normal range of motion. Neck supple.  Cardiovascular: Normal rate and regular rhythm.   Respiratory: Effort normal and breath sounds normal. No respiratory distress. She has no wheezes.  GI: Soft. Bowel sounds are normal. She exhibits no distension. There is no tenderness.  Musculoskeletal: She  exhibits no tenderness.  Min edema left hand and left foot  Neurological: She is oriented to person, place, and time and easily aroused. She appears lethargic. She displays abnormal reflex. She exhibits abnormal muscle tone.  Had difficulty keeping eyes open but able to interact when engage. Soft voice with left  inattention. Left hemiparesis with extensor tone and 1-2 beats clonus left foot.   Motor:  5/5 in Right Delt Bi Tri , Grip , HF, KE, ADF 0/5 in Left Delt Bi Tri Grip, 2- KE, tr HF, 0 ADF Skin: Skin is warm and dry. She is not diaphoretic.  Psychiatric: Her affect is blunt. Her speech is delayed. She is slowed. She expresses inappropriate judgment.   Sensation reduced LT LUE and LLE   Lab Results Last 48 Hours  No results found for this or any previous visit (from the past 48 hour(s)).    Imaging Results (Last 48 hours)  Dg Wrist 2 Views Left   Result Date: 12/08/2016 CLINICAL DATA:  Bruising along the ulnar side of the left wrist and the distal ulna over the past several days. Pt is unsure when the bruises occurred and does not recall any injury. Pt denies pain in the left wrist but is also unable to move .*comment was truncated* EXAM: LEFT WRIST - 2 VIEW COMPARISON:  Plain film of the left wrist dated 08/07/2008. FINDINGS: Osseous alignment is normal. Bone mineralization is normal. No fracture line or displaced fracture fragment seen. No degenerative change. Adjacent soft tissues appear somewhat thickened/edematous compared to previous exam. No focal soft tissue abnormality. IMPRESSION: 1. No acute osseous abnormality. 2. Probable soft tissue edema/thickening. Electronically Signed   By: Bary Richard M.D.   On: 12/08/2016 21:52    Ct Head Wo Contrast   Result Date: 12/08/2016 CLINICAL DATA:  Stroke EXAM: CT HEAD WITHOUT CONTRAST TECHNIQUE: Contiguous axial images were obtained from the base of the skull through the vertex without intravenous contrast. COMPARISON:  Head CT 12/06/2016 FINDINGS: Brain: There is cytotoxic edema within the right MCA territory and right basal ganglia, unchanged from the prior examination. There is no hemorrhagic conversion. There is unchanged mass effect on the right lateral ventricle. No hydrocephalus. Leftward midline shift measures 6 mm at the level of the foramina  of [place], unchanged. Basal cisterns are patent. Vascular: No unexpected calcification. Skull: Normal visualized skull base, calvarium and extracranial soft tissues. Sinuses/Orbits: No sinus fluid levels or advanced mucosal thickening. No mastoid effusion. Normal orbits. IMPRESSION: 1. Unchanged appearance of right MCA territory infarct with 6 mm of leftward midline shift. No hemorrhagic conversion. 2. No ventricular entrapment or hydrocephalus. Electronically Signed   By: Deatra Robinson M.D.   On: 12/08/2016 06:09             Medical Problem List and Plan: 1.  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  DVT Prophylaxis/Anticoagulation: Pharmaceutical: Lovenox 3. Headaches/Pain Management: has been getting Toradol since 7/14. On low dose topamax--will change to nights to help with sedation and may need titrated upwards. .  4. Mood: LCSW to follow for evaluation and support.  5. Neuropsych: This patient is not fully capable of making decisions on her own behalf. 6. Skin/Wound Care: routine pressure relief measures  7. Fluids/Electrolytes/Nutrition: Liquids advanced to thins --d/c IVF. Monitor I/O. Check lytes in am.  8. Dyslipidemia: On Lipitor. 9. Thyroid nodule: follow up on outpatient basis 10. Obesity: Educate on importance of weight loss with appropriate diet to promote health and mobility.  11. Question antiphospholipid syndrome: high Beta 2-glycoprotein IgG. Will need recheck labs in 12 weeks.        Post Admission Physician Evaluation: 1. Functional deficits secondary  to Right MCA infarct. 2. Patient is admitted to receive collaborative, interdisciplinary care between the physiatrist, rehab nursing staff, and therapy team. 3. Patient's level of medical complexity and substantial therapy needs in context of that medical necessity cannot be provided at a lesser intensity of care such as a SNF. 4. Patient has experienced substantial functional loss from his/her baseline which was  documented above under the "Functional History" and "Functional Status" headings.  Judging by the patient's diagnosis, physical exam, and functional history, the patient has potential for functional progress which will result in measurable gains while on inpatient rehab.  These gains will be of substantial and practical use upon discharge  in facilitating mobility and self-care at the household level. 5. Physiatrist will provide 24 hour management of medical needs as well as oversight of the therapy plan/treatment and provide guidance as appropriate regarding the interaction of the two. 6. The Preadmission Screening has been reviewed and patient status is unchanged unless otherwise stated above. 7. 24 hour rehab nursing will assist with bladder management, bowel management, safety, skin/wound care, disease management, medication administration, pain management and patient education  and help integrate therapy concepts, techniques,education, etc. 8. PT will assess and treat for/with: pre gait, gait training, endurance , safety, equipment, neuromuscular re education.   Goals are: Min/Sup. 9. OT will assess and treat for/with: ADLs, Cognitive perceptual skills, Neuromuscular re education, safety, endurance, equipment.   Goals are: Sup/min A. Therapy may proceed with showering this patient. 10. SLP will assess and treat for/with: cognition, swallowing.  Goals are: Safe and adequate po fluid and caloric intake, ModI/S med management. 11. Case Management and Social Worker will assess and treat for psychological issues and discharge planning. 12. Team conference will be held weekly to assess progress toward goals and to determine barriers to discharge. 13. Patient will receive at least 3 hours of therapy per day at least 5 days per week. 14. ELOS: 18-21d       15. Prognosis:  excellent         Erick Colace M.D. [facility] FAAPM&R (Sports Med, Neuromuscular Med) Diplomate Am Board of  Electrodiagnostic Med  Jerene Pitch 12/09/2016

## 2016-12-10 NOTE — Progress Notes (Signed)
[phone]. To room 4MW. Family and possessions at bedside.

## 2016-12-11 ENCOUNTER — Inpatient Hospital Stay (HOSPITAL_COMMUNITY): Payer: Self-pay

## 2016-12-11 ENCOUNTER — Inpatient Hospital Stay (HOSPITAL_COMMUNITY): Payer: Self-pay | Admitting: Physical Therapy

## 2016-12-11 DIAGNOSIS — I69398 Other sequelae of cerebral infarction: Secondary | ICD-10-CM

## 2016-12-11 DIAGNOSIS — R269 Unspecified abnormalities of gait and mobility: Secondary | ICD-10-CM

## 2016-12-11 LAB — COMPREHENSIVE METABOLIC PANEL
ALBUMIN: 2.5 g/dL — AB (ref 3.5–5.0)
ALK PHOS: 102 U/L (ref 38–126)
ALT: 77 U/L — AB (ref 14–54)
ANION GAP: 8 (ref 5–15)
AST: 41 U/L (ref 15–41)
BUN: 13 mg/dL (ref 6–20)
CHLORIDE: 111 mmol/L (ref 101–111)
CO2: 20 mmol/L — AB (ref 22–32)
Calcium: 8.5 mg/dL — ABNORMAL LOW (ref 8.9–10.3)
Creatinine, Ser: 0.83 mg/dL (ref 0.44–1.00)
GFR calc non Af Amer: >60 mL/min (ref >60)
GLUCOSE: 120 mg/dL — AB (ref 65–99)
Potassium: 3.7 mmol/L (ref 3.5–5.1)
SODIUM: 139 mmol/L (ref 135–145)
Total Bilirubin: 0.7 mg/dL (ref 0.3–1.2)
Total Protein: 6.5 g/dL (ref 6.5–8.1)

## 2016-12-11 LAB — CBC WITH DIFFERENTIAL/PLATELET
BASOS PCT: 1 %
Basophils Absolute: 0 10*3/uL (ref 0.0–0.1)
EOS ABS: 0.2 10*3/uL (ref 0.0–0.7)
Eosinophils Relative: 2 %
HCT: 39.7 % (ref 36.0–46.0)
HEMOGLOBIN: 13.3 g/dL (ref 12.0–15.0)
LYMPHS ABS: 2.7 10*3/uL (ref 0.7–4.0)
Lymphocytes Relative: 32 %
MCH: 27.7 pg (ref 26.0–34.0)
MCHC: 33.5 g/dL (ref 30.0–36.0)
MCV: 82.5 fL (ref 78.0–100.0)
Monocytes Absolute: 0.8 10*3/uL (ref 0.1–1.0)
Monocytes Relative: 9 %
NEUTROS PCT: 56 %
Neutro Abs: 4.9 10*3/uL (ref 1.7–7.7)
PLATELETS: 272 10*3/uL (ref 150–400)
RBC: 4.81 MIL/uL (ref 3.87–5.11)
RDW: 13.4 % (ref 11.5–15.5)
WBC: 8.6 10*3/uL (ref 4.0–10.5)

## 2016-12-11 LAB — URINE CULTURE

## 2016-12-11 NOTE — Progress Notes (Signed)
Orthopedic Tech Progress Note Patient Details:  Shannon Erickson 01/04/1976 [id number]  Patient ID: Shannon Erickson, female   DOB: 08/05/1975, 41 y.o.   MRN: [id number]   Shannon DomCrawford, Camera Krienke 12/11/2016, 12:13 PM Called in advanced brace order; spoke with answering service

## 2016-12-11 NOTE — Evaluation (Signed)
Occupational Therapy Assessment and Plan  Patient Details  Name: Shannon Erickson MRN: [id number] Date of Birth: Jul 18, 1975  OT Diagnosis: abnormal posture, cognitive deficits, hemiplegia affecting non-dominant side and muscle weakness (generalized) Rehab Potential:   ELOS: 21-25 days   Today's Date: 12/11/2016 OT Individual Time: 1300-1415 OT Individual Time Calculation (min): 75 min     Problem List:  Patient Active Problem List   Diagnosis Date Noted  . Stroke due to occlusion of right middle cerebral artery ([place]) 12/10/2016  . Gait disturbance, post-stroke 12/10/2016  . Hemiparesis affecting left side as late effect of cerebrovascular accident ([place])   . Left-sided visual neglect   . Antiphospholipid syndrome ([place])   . Pain   . Prediabetes   . Dysphagia, post-stroke   . Carotid atherosclerosis, bilateral   . Thyroid nodule   . Hyperlipidemia   . Chronic migraine without aura without status migrainosus, not intractable   . Headache 12/03/2016  . Hypokalemia 12/03/2016  . Stroke (cerebrum) ([place]) 12/03/2016  . Left-sided weakness     Past Medical History:  Past Medical History:  Diagnosis Date  . Lesion of left ulnar nerve   . Meningitis 1995  . Migraine    Past Surgical History:  Past Surgical History:  Procedure Laterality Date  . CESAREAN SECTION Bilateral 1995, 2002  . TEE WITHOUT CARDIOVERSION N/A 12/05/2016   Procedure: TRANSESOPHAGEAL ECHOCARDIOGRAM (TEE);  Surgeon: Acie Fredrickson Wonda Cheng, MD;  Location: [facility];  Service: Cardiovascular;  Laterality: N/A;    Assessment & Plan Clinical Impression: Shannon Erickson is a 41 year old female with history of meningitis, migraines who was admitted on 12/03/16 with severe right frontoparietal HA, slurred speech, lethargyand left sided weakness. CT head negative. MRI brain done revealing "Large RIGHT MCA territory nonhemorrhagic infarct involving temporal lobe, frontal lobe, insula, basal ganglia and regional white  matter with early mass effect and 1-2 mm. EEG done showing right hemispheric slowing with diffuse cerebral dysfunction due to encephalopathy. BLE dopplers were negative for DVT. TEE done revealing EF 50% with no thrombus, no PFO or ASD. CTA neck showed focal irregularity at right carotid bifurcation with self like plaque and 11 mm thyroid nodule. Stroke felt to be embolic from R-ICA plaque and Dr. Erlinda Hong recommended ASA/Plavix X 3 months followed by Plavix alone. Follow up CT head 7/19 stable and lethargy resolving with improvement in headaches. Coagulopathy panel with high beta 2 glycoprotein IgG and will need repeat test in 12 weeks due question of antiphospholipid syndrome. Patient with resultant left sided weakness, left inattention, delayed processing with cognitive deficits and dysphagia. CIR recommended by rehab team. .    Patient currently requires max with basic self-care skills secondary to muscle weakness, decreased cardiorespiratoy endurance, abnormal tone, decreased coordination and decreased motor planning, decreased midline orientation and decreased attention to left and decreased attention, decreased awareness, decreased problem solving, decreased safety awareness, decreased memory and delayed processing.  Prior to hospitalization, patient could complete BADL, IADL and work with independent .  Patient will benefit from skilled intervention to increase independence with basic self-care skills prior to discharge home with care partner.  Anticipate patient will require 24 hour supervision and minimal physical assistance and follow up home health.  OT - End of Session Endurance Deficit: Yes OT Assessment Rehab Potential (ACUTE ONLY): Good OT Barriers to Discharge: Inaccessible home environment OT Barriers to Discharge Comments: 3 steps to enter bedroom OT Patient demonstrates impairments in the following area(s): Balance;Cognition;Endurance;Motor;Perception;Safety;Sensory OT Basic ADL's  Functional Problem(s): Grooming;Bathing;Dressing;Toileting OT  Additional Impairment(s): Fuctional Use of Upper Extremity OT Plan OT Intensity: Minimum of 1-2 x/day, 45 to 90 minutes OT Frequency: 5 out of 7 days OT Duration/Estimated Length of Stay: 21-25 days OT Treatment/Interventions: Balance/vestibular training;Cognitive remediation/compensation;Discharge planning;Disease mangement/prevention;Functional electrical stimulation;Functional mobility training;Neuromuscular re-education;DME/adaptive equipment instruction;Patient/family education;Psychosocial support;Self Care/advanced ADL retraining;Splinting/orthotics;Therapeutic Activities;Therapeutic Exercise;UE/LE Strength taining/ROM;UE/LE Coordination activities;Visual/perceptual remediation/compensation OT Self Feeding Anticipated Outcome(s): MOD I OT Basic Self-Care Anticipated Outcome(s): S OT Toileting Anticipated Outcome(s): S toilet; MIN A shower OT Bathroom Transfers Anticipated Outcome(s): S toilet; MIN A shower OT Recommendation Patient destination: Home Follow Up Recommendations: Home health OT Equipment Recommended: 3 in 1 bedside comode;To be determined;Tub/shower bench   Skilled Therapeutic Intervention 1:1. Pt with no c/o pain. Pt daughter and daughter in law present throughout session. Pt supine to sitting EOB with MOD A for LE/trunk management. Pt stand pivot transfer MAX A EOB>w/c<>shower seat with VC for sequencing, hand placement and safety awareness. Pt bathes seated on TTB with A to wash B lower legs, back and buttocks in standing with MOD A and hand on grab bar. Pt facilitates normal movement with HOH A of LUE to wash RUE. Pt requires VC to scan left to locate clothing items 4/5 times. While seated in w/c at sink pt dons pull over shirt with VC for hemi techniques and A to thread L sleeve and pull shirt over trunk. With A to maintain LLE in seated figure 4 pt able to thread BLE into pants and don socks with 1 handed  technique. Pt stands with MOD A with RUE on sink for OT to advance pants past hips. Pt grooms in standing at sink with MOD increasing to MAX A with fatigue with VC to weight shift R. Exited session with pt seated in w/c at sink with daughter brushing hair and all needs met. Educated pt on importance of call ing for staff assistance to get back in bed/to toilet.   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, agricultural: Full time employment Comments: works full time, very independent ADL   Vision Baseline Vision/History: No visual deficits Patient Visual Report: No change from baseline Perception  Perception: Impaired Inattention/Neglect: Does not attend to left side of body;Does not attend to left visual field Praxis Praxis: Impaired Praxis Impairment Details: Motor planning Cognition Overall Cognitive Status: Impaired/Different from baseline Arousal/Alertness: Awake/alert Orientation Level: Person;Place;Situation Person: Oriented Place: Oriented Situation: Oriented Year: 2018 Month: July Day of Week: Correct Immediate Memory Recall: (P) Sock;Blue;Bed Attention: Sustained;Selective Sustained Attention: Appears intact Selective Attention Impairment: Functional basic Problem Solving: Impaired Safety/Judgment: Impaired Comments: impulsive Sensation Sensation Light Touch: Impaired  Detail Light Touch Impaired Details: Impaired LUE;Impaired LLE Proprioception: Impaired Detail Proprioception Impaired Details: Impaired LUE;Impaired LLE Additional Comments: sensory extinction LUE/LLE Coordination Gross Motor Movements are Fluid and Coordinated: No Finger Nose Finger Test: unable to perform LLE d/t flaccidity Heel Shin Test: unable to perform LLE d/t flaccidity Motor  Motor Motor: Hemiplegia;Abnormal tone Mobility  Bed Mobility Bed Mobility: Rolling Right;Rolling Left;Supine to Sit Rolling Right: 4: Min guard;With rail Rolling Left:  4: Min guard;With rail Supine to Sit: 3: Mod assist Supine to Sit Details: Verbal cues for sequencing;Verbal cues for technique;Verbal cues for precautions/safety;Tactile cues for sequencing;Tactile cues for initiation;Tactile cues for posture;Tactile cues for weight shifting Transfers Sit to Stand: 3: Mod assist;With armrests;With upper extremity assist Sit to Stand Details: Verbal cues for sequencing;Tactile cues for sequencing;Tactile cues for placement;Tactile cues for initiation;Verbal cues for technique;Verbal cues for precautions/safety;Manual facilitation for weight shifting;Manual facilitation for weight bearing Sit to Stand Details (indicate cue type and reason): assist for LLE stance control Stand to Sit: 3: Mod assist;With upper extremity assist Stand to Sit Details (indicate cue type and reason): Verbal cues for sequencing;Verbal cues for technique;Verbal cues for precautions/safety;Tactile cues for initiation;Tactile cues for sequencing;Tactile cues for placement;Tactile cues for weight shifting;Manual facilitation for weight bearing;Manual facilitation for placement  Trunk/Postural Assessment  Cervical Assessment Cervical Assessment: Within Functional Limits Thoracic Assessment Thoracic Assessment: Exceptions to [facility] (increased kyphosis) Lumbar Assessment Lumbar Assessment: Exceptions to [facility] (reduced lumbar lordosis,  posterior pelvic tilt) Postural Control Postural Control: Deficits on evaluation (consistent LOB to L side with poor righting reactions)  Balance Balance Balance Assessed: Yes Static Sitting Balance Static Sitting - Balance Support: Feet supported;Right upper extremity supported Static Sitting - Level of Assistance: 5: Stand by assistance Dynamic Sitting Balance Dynamic Sitting - Balance Support: Feet supported Dynamic Sitting - Level of Assistance: 3: Mod assist Reach (Patient is able to reach ___ inches to right, left, forward, back): LOB to L side when reaching forward and to L, modA to recover Sitting balance - Comments: pt able to sit EOB with R UE support and min guard for safety Static Standing Balance Static Standing - Balance Support: Right upper extremity supported Static Standing - Level of Assistance: 2: Max assist Dynamic Standing Balance Dynamic Standing - Balance Support: Right upper extremity supported Dynamic Standing - Level of Assistance: 1: +1 Total assist Extremity/Trunk Assessment RUE Assessment RUE Assessment: Within Functional Limits LUE Assessment LUE Assessment: Exceptions to [facility] (full PROM, flaccid)   See Function Navigator for Current Functional Status.   Refer to Care Plan for Long Term Goals  Recommendations for other services: None    Discharge Criteria: Patient will be discharged from OT if patient refuses treatment 3 consecutive times without medical reason, if treatment goals not met, if there is a change in medical status, if patient makes no progress towards goals or if patient is discharged from hospital.  The above assessment, treatment plan, treatment alternatives and goals were discussed and mutually agreed upon: by patient  Tonny Branch 12/11/2016, 1:07 PM

## 2016-12-11 NOTE — Evaluation (Addendum)
Physical Therapy Assessment and Plan  Patient Details  Name: Shannon Erickson MRN: [id number] Date of Birth: 06/29/1975  PT Diagnosis: Abnormality of gait, Coordination disorder, Difficulty walking, Hemiplegia non-dominant, Hypertonia, Impaired sensation and Muscle weakness Rehab Potential: Good ELOS: 25-28 days   Today's Date: 12/11/2016 PT Individual Time: 1100-1200 PT Individual Time Calculation (min): 60 min    Problem List:  Patient Active Problem List   Diagnosis Date Noted  . 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 Gait disturbance, post-stroke 12/10/2016  . Hemiparesis affecting left side as late effect of cerebrovascular accident ([place])   . Left-sided visual neglect   . Antiphospholipid syndrome ([place])   . Pain   . Prediabetes   . Dysphagia, post-stroke   . Carotid atherosclerosis, bilateral   . Thyroid nodule   . Hyperlipidemia   . Chronic migraine without aura without status migrainosus, not intractable   . Headache 12/03/2016  . Hypokalemia 12/03/2016  . Stroke (cerebrum) ([place]) 12/03/2016  . Left-sided weakness     Past Medical History:  Past Medical History:  Diagnosis Date  . Lesion of left ulnar nerve   . Meningitis 1995  . Migraine    Past Surgical History:  Past Surgical History:  Procedure Laterality Date  . CESAREAN SECTION Bilateral 1995, 2002  . TEE WITHOUT CARDIOVERSION N/A 12/05/2016   Procedure: TRANSESOPHAGEAL ECHOCARDIOGRAM (TEE);  Surgeon: Acie Fredrickson Wonda Cheng, MD;  Location: [facility] ENDOSCOPY;  Service: Cardiovascular;  Laterality: N/A;    Assessment & Plan Clinical Impression: Shannon Erickson is a 41 year old female with history of meningitis, migraines who was admitted on 12/03/16 with severe right frontoparietal HA, slurred speech, lethargyand left sided weakness. CT head negative. MRI brain done revealing "Large RIGHT MCA territory nonhemorrhagic infarct involving temporal lobe, frontal lobe, insula, basal  ganglia and regional white matter with early mass effect and 1-2 mm. EEG done showing right hemispheric slowing with diffuse cerebral dysfunction due to encephalopathy. BLE dopplers were negative for DVT. TEE done revealing EF 50% with no thrombus, no PFO or ASD. CTA neck showed focal irregularity at right carotid bifurcation with self like plaque and 11 mm thyroid nodule. Stroke felt to be embolic from R-ICA plaque and Dr. Erlinda Hong recommended ASA/Plavix X 3 months followed by Plavix alone. Follow up CT head 7/19 stable and lethargy resolving with improvement in headaches. Coagulopathy panel with high beta 2 glycoprotein IgG and will need repeat test in 12 weeks due question of antiphospholipid syndrome. Patient with resultant left sided weakness, left inattention, delayed processing with cognitive deficits and dysphagia. CIR recommended by rehab team. Patient transferred to CIR on 12/10/2016 .   Patient currently requires max with mobility secondary to muscle weakness and muscle paralysis, decreased cardiorespiratoy endurance, impaired timing and sequencing, abnormal tone, unbalanced muscle activation, motor apraxia, decreased coordination and decreased motor planning, decreased attention to left, left side neglect and decreased motor planning and decreased sitting balance, decreased standing balance, decreased postural control, hemiplegia and decreased balance strategies.  Prior to hospitalization, patient was independent  with mobility and lived with Family in a House home.  Home access is 3Stairs to enter.  Patient will benefit from skilled PT intervention to maximize safe functional mobility, minimize fall risk and decrease caregiver burden for planned discharge home with 24 hour assist.  Anticipate patient will benefit from follow up [facility] at discharge.  PT - End of Session Activity Tolerance: Tolerates 30+ min activity with multiple rests Endurance Deficit: Yes Endurance Deficit  Description: fatigues  quickly with short duration mobility activities PT Assessment Rehab Potential (ACUTE/IP ONLY): Good PT Barriers to Discharge: Home environment access/layout PT Barriers to Discharge Comments: stairs to enter home PT Patient demonstrates impairments in the following area(s): Balance;Endurance;Motor;Perception;Safety;Sensory;Skin Integrity PT Transfers Functional Problem(s): Bed Mobility;Bed to Chair;Car;Furniture PT Locomotion Functional Problem(s): Ambulation;Wheelchair Mobility;Stairs PT Plan PT Intensity: Minimum of 1-2 x/day ,45 to 90 minutes PT Frequency: 5 out of 7 days PT Duration Estimated Length of Stay: 25-28 days PT Treatment/Interventions: Ambulation/gait training;Balance/vestibular training;Discharge planning;Disease management/prevention;Neuromuscular re-education;Functional mobility training;Psychosocial support;Skin care/wound management;Therapeutic Activities;Visual/perceptual remediation/compensation;Wheelchair propulsion/positioning;Therapeutic Exercise;UE/LE Strength taining/ROM;Splinting/orthotics;Pain management;DME/adaptive equipment instruction;Cognitive remediation/compensation;Community reintegration;Functional electrical stimulation;Patient/family education;Stair training;UE/LE Coordination activities PT Transfers Anticipated Outcome(s): S PT Locomotion Anticipated Outcome(s): minA with LRAD PT Recommendation Recommendations for Other Services: Neuropsych consult;Therapeutic Recreation consult Therapeutic Recreation Interventions: Pet therapy;Stress management;Outing/community reintergration;Kitchen group Follow Up Recommendations: Home health PT;24 hour supervision/assistance Patient destination: Home Equipment Recommended: To be determined  Skilled Therapeutic Intervention Pt received supine in bed, denies pain and agreeable to treatment. PT initial evaluation performed and completed with maxA overall as described below due to L hemiplegia, L inattention. Educated  pt, pt's daughter ad daughter-in-law in rehab process, goals, estimated LOS, and falls prevention safety with use of staff to perform transfers at this time until pt safer. Therapist stepped out of room to retrieve safety plan to post in room, when therapist returned pt's daughter had picked up pt and was attempting to transfer her back to bed totalA. Required assist from therapist to safely guide hips towards bed and safely sit. Returned pt to supine maxA. Reiterated importance of requesting staff help for transfers as pt high fall risk, flaccid in L side and requires skilled assistance for transfers; daughter and daughter in law verbalize understanding. Alerted RN and NT to pt's family attempting to transfer pt, requested that the reiterate importance of having staff help. Pt remained supine in bed at end of session, all needs in reach.   PT Evaluation Precautions/Restrictions Precautions Precautions: Fall Precaution Comments: L hemiplegia Restrictions Weight Bearing Restrictions: No General Chart Reviewed: Yes Response to Previous Treatment: Not applicable Family/Caregiver Present: Yes   Pain Pain Assessment Pain Assessment: No/denies pain Home Living/Prior Functioning Home Living Available Help at Discharge: Family Type of Home: House Home Access: Stairs to enter [employer] of Steps: 3 Entrance Stairs-Rails: None Home Layout: Two level;Able to live on main level with bedroom/bathroom Alternate Level Stairs-Number of Steps: flight Prior Function Vocation: Full time employment Comments: works full time, very independent Vision/Perception  Perception Perception: Impaired Inattention/Neglect: Does not attend to left side of body;Does not attend to left visual field  Cognition Overall Cognitive Status: Impaired/Different from baseline Arousal/Alertness: Awake/alert Attention: Sustained;Selective Sustained Attention: Appears intact Selective Attention Impairment:  Functional basic Problem Solving: Impaired Safety/Judgment: Impaired Comments: impulsive Sensation Sensation Light Touch: Impaired Detail Light Touch Impaired Details: Impaired LUE;Impaired LLE Proprioception: Impaired Detail Proprioception Impaired Details: Impaired LUE;Impaired LLE Additional Comments: sensory extinction LUE/LLE Coordination Gross Motor Movements are Fluid and Coordinated: No Finger Nose Finger Test: unable to perform LLE d/t flaccidity Heel Shin Test: unable to perform LLE d/t flaccidity Motor  Motor Motor: Hemiplegia;Abnormal tone  Mobility Bed Mobility Bed Mobility: Rolling Right;Rolling Left;Supine to Sit Rolling Right: 4: Min guard;With rail Rolling Left: 4: Min guard;With rail Supine to Sit: 3: Mod assist Supine to Sit Details: Verbal cues for sequencing;Verbal cues for technique;Verbal cues for precautions/safety;Tactile cues for sequencing;Tactile cues for initiation;Tactile cues for posture;Tactile cues for weight shifting Transfers Transfers: Yes Sit  to Stand: 3: Mod assist;With armrests;With upper extremity assist Sit to Stand Details: Verbal cues for sequencing;Tactile cues for sequencing;Tactile cues for placement;Tactile cues for initiation;Verbal cues for technique;Verbal cues for precautions/safety;Manual facilitation for weight shifting;Manual facilitation for weight bearing Sit to Stand Details (indicate cue type and reason): assist for LLE stance control Stand to Sit: 3: Mod assist;With upper extremity assist Stand to Sit Details (indicate cue type and reason): Verbal cues for sequencing;Verbal cues for technique;Verbal cues for precautions/safety;Tactile cues for initiation;Tactile cues for sequencing;Tactile cues for placement;Tactile cues for weight shifting;Manual facilitation for weight bearing;Manual facilitation for placement Stand Pivot Transfers: 2: Max assist Stand Pivot Transfer Details: Verbal cues for technique;Verbal cues for  precautions/safety;Tactile cues for weight beaing;Tactile cues for weight shifting;Tactile cues for sequencing;Tactile cues for placement;Manual facilitation for weight bearing;Manual facilitation for weight shifting Stand Pivot Transfer Details (indicate cue type and reason): assist for LLE managemnet, stance control Locomotion  Ambulation Ambulation: Yes Ambulation/Gait Assistance: 1: +2 Total assist Ambulation Distance (Feet): 25 Feet Assistive device: Other (Comment) (RUE rail in hall) Ambulation/Gait Assistance Details: Verbal cues for gait pattern;Verbal cues for sequencing;Verbal cues for technique;Manual facilitation for weight bearing;Manual facilitation for placement;Manual facilitation for weight shifting;Tactile cues for weight beaing;Tactile cues for weight shifting;Tactile cues for sequencing;Tactile cues for placement;Tactile cues for posture;Tactile cues for initiation Ambulation/Gait Assistance Details: assist for progression and stance control LLE Gait Gait: Yes Gait Pattern: Impaired Gait Pattern: Step-to pattern;Decreased stance time - left;Decreased weight shift to left;Poor foot clearance - left;Narrow base of support;Lateral hip instability Gait velocity: significantly decreased  Stairs / Additional Locomotion Stairs: Yes Stairs Assistance: 2: Max Industrial/product designer Assistance Details: Manual facilitation for placement;Manual facilitation for weight bearing;Manual facilitation for weight shifting;Verbal cues for technique;Verbal cues for sequencing;Tactile cues for posture;Tactile cues for sequencing;Tactile cues for placement;Tactile cues for weight shifting;Tactile cues for weight beaing Stair Management Technique: One rail Right;Step to pattern;Backwards;Forwards (forwards ascent, backwards descent) Number of Stairs: 4 Height of Stairs: 3 Wheelchair Mobility Wheelchair Mobility: No (unable to reach floor with RLE for hemi technique)  Trunk/Postural Assessment  Cervical  Assessment Cervical Assessment: Within Functional Limits Thoracic Assessment Thoracic Assessment: Exceptions to [facility] (increased kyphosis) Lumbar Assessment Lumbar Assessment: Exceptions to [facility] (reduced lumbar lordosis, posterior pelvic tilt) Postural Control Postural Control: Deficits on evaluation (consistent LOB to L side with poor righting reactions)  Balance Balance Balance Assessed: Yes Static Sitting Balance Static Sitting - Balance Support: Feet supported;Right upper extremity supported Static Sitting - Level of Assistance: 5: Stand by assistance Dynamic Sitting Balance Dynamic Sitting - Balance Support: Feet supported;Bilateral upper extremity supported Dynamic Sitting - Level of Assistance: 3: Mod assist Reach (Patient is able to reach ___ inches to right, left, forward, back): LOB to L side when reaching forward and to L, modA to recover Static Standing Balance Static Standing - Balance Support: Right upper extremity supported Static Standing - Level of Assistance: 2: Max assist Dynamic Standing Balance Dynamic Standing - Balance Support: Right upper extremity supported Dynamic Standing - Level of Assistance: 1: +1 Total assist Extremity Assessment  RUE Assessment RUE Assessment: Within Functional Limits LUE Assessment LUE Assessment: Exceptions to [facility] (full PROM, flaccid) RLE Assessment RLE Assessment: Within Functional Limits LLE Assessment LLE Assessment: Exceptions to [facility] LLE Strength LLE Overall Strength: Deficits Left Hip Flexion: 0/5 Left Knee Flexion: 1/5 Left Knee Extension: 1/5 Left Ankle Dorsiflexion: 0/5 Left Ankle Plantar Flexion: 0/5 LLE Tone LLE Tone: Moderate;Hypertonic Hypertonic Details: extensor tone and flexor withdrawal both noted throughout mobility tasks  See Function Navigator for Current Functional Status.   Refer to Care Plan for Long Term Goals  Recommendations for other services: Neuropsych and Therapeutic Recreation  Pet  therapy, Kitchen group, Stress management and Outing/community reintegration  Discharge Criteria: Patient will be discharged from PT if patient refuses treatment 3 consecutive times without medical reason, if treatment goals not met, if there is a change in medical status, if patient makes no progress towards goals or if patient is discharged from hospital.  The above assessment, treatment plan, treatment alternatives and goals were discussed and mutually agreed upon: by patient and by family  Luberta Mutter 12/11/2016, 12:58 PM

## 2016-12-11 NOTE — Progress Notes (Signed)
Subjective/Complaints: No issues overnite Son's wife requesting FMLA paperwork for him Slept poorly last noc but sleeping and dificult to arouse this am ROS- lethargic, unable to obtain  Objective: Vital Signs: Blood pressure 111/65, pulse 75, temperature 97.8 F (36.6 C), temperature source Oral, resp. rate 16, height 5' 3"  (1.6 m), weight 89.4 kg (197 lb), SpO2 98 %. No results found. Results for orders placed or performed during the hospital encounter of 12/10/16 (from the past 72 hour(s))  Urinalysis, Routine w reflex microscopic     Status: Abnormal   Collection Time: 12/10/16  6:26 PM  Result Value Ref Range   Color, Urine YELLOW YELLOW   APPearance CLEAR CLEAR   Specific Gravity, Urine 1.012 1.005 - 1.030   pH 7.0 5.0 - 8.0   Glucose, UA NEGATIVE NEGATIVE mg/dL   Hgb urine dipstick LARGE (A) NEGATIVE   Bilirubin Urine NEGATIVE NEGATIVE   Ketones, ur NEGATIVE NEGATIVE mg/dL   Protein, ur NEGATIVE NEGATIVE mg/dL   Nitrite NEGATIVE NEGATIVE   Leukocytes, UA NEGATIVE NEGATIVE   RBC / HPF 6-30 0 - 5 RBC/hpf   WBC, UA 0-5 0 - 5 WBC/hpf   Bacteria, UA RARE (A) NONE SEEN   Squamous Epithelial / LPF 0-5 (A) NONE SEEN  CBC WITH DIFFERENTIAL     Status: None   Collection Time: 12/11/16  4:43 AM  Result Value Ref Range   WBC 8.6 4.0 - 10.5 K/uL   RBC 4.81 3.87 - 5.11 MIL/uL   Hemoglobin 13.3 12.0 - 15.0 g/dL   HCT 39.7 36.0 - 46.0 %   MCV 82.5 78.0 - 100.0 fL   MCH 27.7 26.0 - 34.0 pg   MCHC 33.5 30.0 - 36.0 g/dL   RDW 13.4 11.5 - 15.5 %   Platelets 272 150 - 400 K/uL   Neutrophils Relative % 56 %   Neutro Abs 4.9 1.7 - 7.7 K/uL   Lymphocytes Relative 32 %   Lymphs Abs 2.7 0.7 - 4.0 K/uL   Monocytes Relative 9 %   Monocytes Absolute 0.8 0.1 - 1.0 K/uL   Eosinophils Relative 2 %   Eosinophils Absolute 0.2 0.0 - 0.7 K/uL   Basophils Relative 1 %   Basophils Absolute 0.0 0.0 - 0.1 K/uL  Comprehensive metabolic panel     Status: Abnormal   Collection Time: 12/11/16   4:43 AM  Result Value Ref Range   Sodium 139 135 - 145 mmol/L   Potassium 3.7 3.5 - 5.1 mmol/L   Chloride 111 101 - 111 mmol/L   CO2 20 (L) 22 - 32 mmol/L   Glucose, Bld 120 (H) 65 - 99 mg/dL   BUN 13 6 - 20 mg/dL   Creatinine, Ser 0.83 0.44 - 1.00 mg/dL   Calcium 8.5 (L) 8.9 - 10.3 mg/dL   Total Protein 6.5 6.5 - 8.1 g/dL   Albumin 2.5 (L) 3.5 - 5.0 g/dL   AST 41 15 - 41 U/L   ALT 77 (H) 14 - 54 U/L   Alkaline Phosphatase 102 38 - 126 U/L   Total Bilirubin 0.7 0.3 - 1.2 mg/dL   GFR calc non Af Amer >60 >60 mL/min   GFR calc Af Amer >60 >60 mL/min    Comment: (NOTE) The eGFR has been calculated using the CKD EPI equation. This calculation has not been validated in all clinical situations. eGFR's persistently <60 mL/min signify possible Chronic Kidney Disease.    Anion gap 8 5 - 15  HEENT: normal Cardio: RRR and no murmur Resp: CTA B/L and unlabored GI: BS positive and NT, ND Extremity:  No Edema Skin:   Intact Neuro: Lethargic, Abnormal Sensory reduced Left side, Abnormal Motor 3-/5 Left HF KE, 0 at ankle , 0 in LUE, increaseed finger flexor tone and Inattention Musc/Skel:  Normal Gen NAD   Assessment/Plan: 1. Functional deficits secondary to Right MCA infarct which require 3+ hours per day of interdisciplinary therapy in a comprehensive inpatient rehab setting. Physiatrist is providing close team supervision and 24 hour management of active medical problems listed below. Physiatrist and rehab team continue to assess barriers to discharge/monitor patient progress toward functional and medical goals. FIM:       Function - Toileting Toileting activity did not occur: Safety/medical concerns Toileting steps completed by patient: Adjust clothing after toileting Toileting steps completed by helper: Adjust clothing after toileting Toileting Assistive Devices: Toilet aid Assist level: Touching or steadying assistance (Pt.75%)           Function -  Comprehension Comprehension: Auditory Comprehension assist level: Follows complex conversation/direction with no assist  Function - Expression Expression: Verbal  Function - Social Interaction Social Interaction assist level: Interacts appropriately with others - No medications needed.  Function - Problem Solving Problem solving assist level: Solves complex problems: Recognizes & self-corrects  Function - Memory Memory assist level: More than reasonable amount of time Patient normally able to recall (first 3 days only): Current season, Location of own room, Staff names and faces, That he or she is in a hospital   Medical Problem List and Plan: 1. Left hemiparesis secondary to Right MCA infarct CIR PT, OT, SLP evals today 2. DVT Prophylaxis/Anticoagulation: Pharmaceutical: Lovenox 3. Headaches/Pain Management: has been getting Toradol since 7/14. On low dose topamax--will change to nights to help with sedation and may need titrated upwards. .  4. Mood: LCSW to follow for evaluation and support.  5. Neuropsych: This patient is not fullycapable of making decisions on herown behalf. 6. Skin/Wound Care: routine pressure relief measures  7. Fluids/Electrolytes/Nutrition: Liquids advanced to thins --d/c IVF. Monitor I/O.  lytes normal today, low alb will supplement 8. Dyslipidemia: On Lipitor. 9. Thyroid nodule: follow up on outpatient basis 10. Obesity: Educate on importance of weight loss with appropriate diet to promote health and mobility.  11. Question antiphospholipid syndrome: high Beta 2-glycoprotein IgG. Will need recheck labs in 12 weeks. 12.  Spasticity L Finger flexors resting hand splint LOS (Days) 1 A FACE TO FACE EVALUATION WAS PERFORMED  Cabrini Ruggieri E 12/11/2016, 7:36 AM

## 2016-12-12 ENCOUNTER — Inpatient Hospital Stay (HOSPITAL_COMMUNITY): Payer: Medicaid Other | Admitting: Speech Pathology

## 2016-12-12 ENCOUNTER — Inpatient Hospital Stay (HOSPITAL_COMMUNITY): Payer: Self-pay | Admitting: Occupational Therapy

## 2016-12-12 ENCOUNTER — Inpatient Hospital Stay (HOSPITAL_COMMUNITY): Payer: Self-pay | Admitting: Physical Therapy

## 2016-12-12 ENCOUNTER — Inpatient Hospital Stay (HOSPITAL_COMMUNITY): Payer: Self-pay

## 2016-12-12 ENCOUNTER — Encounter (HOSPITAL_COMMUNITY): Payer: Self-pay

## 2016-12-12 LAB — PROTHROMBIN GENE MUTATION

## 2016-12-12 MED ORDER — TRAZODONE HCL 50 MG PO TABS: 25.0000 mg | ORAL_TABLET | Freq: Every evening | ORAL | Status: DC | PRN

## 2016-12-12 MED ORDER — PRO-STAT SUGAR FREE PO LIQD
30.0000 mL | Freq: Two times a day (BID) | ORAL | Status: DC
  Administered 2016-12-12 – 2016-12-16 (×6): 30 mL via ORAL
  Filled 2016-12-12 (×11): qty 30

## 2016-12-12 NOTE — Progress Notes (Signed)
Marcello Fennel, MD Physician Signed Physical Medicine and Rehabilitation  Consult Note Date of Service: 12/05/2016 5:55 AM  Related encounter: ED to Hosp-Admission (Discharged) from 12/03/2016 in [facility]     Expand All Collapse All   [] Hide copied text [] Hover for attribution information      Physical Medicine and Rehabilitation Consult Reason for Consult: Left side weakness Referring Physician: [facility]   HPI: Shannon Erickson is a 41 y.o. right handed female with history of migraine headaches. Per chart review and daughter-in-law, patient lives with family. Two-level home with bedroom first floor. Independent prior to admission. Presented 12/03/2016 with left-sided weakness and jerking movement as well as headache. CT/MRI reviewed, showing right MCA CVA.  Per report, large right MCA territory nonhemorrhagic infarct. Patient did not receive TPA. CT Angio head and neck showed a proximal right M1 occlusion. Incidental finding of a 11 mm thyroid nodule. Patient did not receive TPA. Echocardiogram and lower extremity Dopplers are pending. EEG pending. Aspirin for CVA prophylaxis. Subcutaneous Lovenox for DVT prophylaxis. Neurology follow-up with workup ongoing. Formal physical and occupational therapy evaluations pending. M.D. has requested physical medicine rehabilitation consult. Per daughter-in-law, pt was given sedatives yesterday for TEE and has been difficult to arouse since.   Review of Systems  Unable to perform ROS: Other  Extremely somnolent      Past Medical History:  Diagnosis Date  . Migraine         Past Surgical History:  Procedure Laterality Date  . CESAREAN SECTION Bilateral 1995, 2002   History reviewed. No pertinent family history., unable to question patient Social History:  reports that she has never smoked. She has never used smokeless tobacco. She reports that she does not drink alcohol. Her drug history is  not on file. Allergies:       Allergies  Allergen Reactions  . Penicillins Other (See Comments)    unknown         Medications Prior to Admission  Medication Sig Dispense Refill  . aspirin-acetaminophen-caffeine (EXCEDRIN MIGRAINE) 250-250-65 MG tablet Take 1 tablet by mouth every 6 (six) hours as needed for headache.      Home: Home Living Family/patient expects to be discharged to:: Private residence Living Arrangements: Children, Other relatives  Functional History: Functional Status:  Mobility:  ADL:  Cognition: Cognition Orientation Level: Oriented X4  Blood pressure (!) 111/48, pulse 91, temperature 98.5 F (36.9 C), temperature source Axillary, resp. rate 18, height 5\' 3"  (1.6 m), weight 88.3 kg (194 lb 10.7 oz), SpO2 98 %. Physical Exam  Vitals reviewed. Constitutional: She appears well-developed and well-nourished.  HENT:  Head: Normocephalic and atraumatic.  Eyes: EOM are normal. Right eye exhibits no discharge. Left eye exhibits no discharge.  Neck: Normal range of motion. Neck supple. No thyromegaly present.  Cardiovascular: Normal rate and regular rhythm.   Respiratory: Effort normal and breath sounds normal. No respiratory distress.  GI: Soft. Bowel sounds are normal. She exhibits no distension.  Musculoskeletal: She exhibits no edema or tenderness.  Neurological:  Lethargic  Unable to accurately assess orientation or MMT due to lack of participation, however, see moving RUE.  Skin: Skin is warm and dry.  Psychiatric:  Unable to assess due to somnolence    Lab Results Last 24 Hours       Results for orders placed or performed during the hospital encounter of 12/03/16 (from the past 24 hour(s))  Antithrombin III     Status: None  Collection Time: 12/04/16  1:16 PM  Result Value Ref Range   AntiThromb III Func 113 75 - 120 %      Imaging Results (Last 48 hours)  Ct Angio Head W Or Wo Contrast  Result Date: 12/03/2016 CLINICAL  DATA:  Right MCA territory infarct.  Migraine headaches. EXAM: CT ANGIOGRAPHY HEAD AND NECK TECHNIQUE: Multidetector CT imaging of the head and neck was performed using the standard protocol during bolus administration of intravenous contrast. Multiplanar CT image reconstructions and MIPs were obtained to evaluate the vascular anatomy. Carotid stenosis measurements (when applicable) are obtained utilizing NASCET criteria, using the distal internal carotid diameter as the denominator. CONTRAST:  50 mL Isovue 370 was administered into the right antecubital fossa with contrast extravasation. Contrast extravasation consultation: Type of contrast: Isovue 370 site of extravasation: Right antecubital fossa Estimated volume of extravasation: <50 ml Area of extravasation scanned with CT? no PATIENT'S SIGNS AND SYMPTOMS Skin blistering/ulceration: no Decrease capillary refill: no Change in skin color: no Decreased motor function or severe tightness: Unable to assess due to stroke Decreased pulses distal to site of extravasation: no Altered sensation: Unable to assess due to stroke Increasing pain or signs of increased swelling during observation: no TREATMENT Limb elevation: yes Ice packs applied: yes Heat pads applied: yes Plastic surgery consulted? no DOCUMENTATION AND FOLLOW-UP Site contrast extravasation forms submitted? yes Post extravasation orders completed? yes Was additional follow up assigned to PA's? Yes COMPARISON:  MRI brain from the same day at 11:55 a.m. CT head without contrast at 3:08 a.m. FINDINGS: CT HEAD FINDINGS Brain: Hypoattenuation involving the right temporal lobe, insular cortex, and basal ganglia matches the diffusion abnormality. There is effacement of the sulci. Midline shift is 2 mm. There is no hemorrhage. A hyperdense right MCA is noted. No new infarcts are present. Vascular: Hyperdense right MCA. Skull: The calvarium is intact. No focal lytic or blastic lesions are present. Sinuses: Minimal  mucosal thickening is present in the left maxillary sinus. The remaining paranasal sinuses and the mastoid air cells are clear. Orbits: The globes and orbits are within normal limits. Review of the MIP images confirms the above findings CTA NECK FINDINGS Aortic arch: There is a common origin of the left common carotid artery and the innominate artery. There is no focal stenosis of the great vessel origins at the aorta. No significant vascular calcifications are present at the aorta. Right carotid system: The right common carotid artery is within normal limits. Soft tissue plaque is noted along the posterior proximal right internal carotid artery at the bifurcation. There is a shelf-like plaque. The lumen is narrowed to 3 mm maximally. The more distal right internal carotid artery is within normal limits. Left carotid system: The left common carotid artery is within normal limits. Atherosclerotic changes are noted at the bifurcation. The cervical left internal carotid artery is normal. Vertebral arteries: The vertebral arteries both originate from the subclavian arteries without significant stenoses. Both vertebral arteries are small without significant focal stenosis in the neck. Skeleton: Age advanced degenerative changes are noted at C5-6 and C6-7. Left greater than right uncovertebral spurring is present at C4-5. Vertebral body heights are maintained. No focal lytic or blastic lesions are present. Other neck: 11 mm inferior thyroid nodule is present on the left. No other discrete thyroid lesions are present. There is no significant adenopathy. No focal mucosal lesions are evident. The salivary glands are within normal limits. Upper chest: Ground-glass attenuation of the lung apices bilaterally likely reflects edema  or atelectasis. No focal nodule or mass lesion is present. Review of the MIP images confirms the above findings CTA HEAD FINDINGS Anterior circulation: Internal carotid artery's are within normal  limits through the ICA termini bilaterally. The A1 segments are patent. Anterior communicating artery is patent. There is a proximal occlusion of the right M1 segment. The left M1 segment is within normal limits. The left MCA bifurcation is unremarkable. Left MCA and right ACA branch vessels are within normal limits. Anterior and superior right MCA branch vessels perfuse. Posterior and inferior branch vessels are occluded. Posterior circulation: The vertebral arteries are small. PICA origins are visualized and is normal. The basilar artery is small, terminating at the superior cerebellar artery is. Fetal type posterior cerebral arteries are present bilaterally. The PCA branch vessels are within normal limits. Venous sinuses: The dural sinuses are patent. The right transverse sinus is dominant. The straight sinus deep cerebral veins are intact. Cortical veins are within normal limits. Anatomic variants: Bilateral fetal type posterior cerebral artery is. Delayed phase: The infarct is again noted. No pathologic enhancement is evident. Review of the MIP images confirms the above findings IMPRESSION: 1. Proximal right M1 conclusion. 2. Expected evolution of right temporal lobe, insular cortex, and basal ganglia nonhemorrhagic infarction. 3. Focal irregularity at the right carotid bifurcation with a shelf-like soft tissue plaque but no significant stenosis relative to the more distal vessel. 4. More mild atherosclerotic changes at the left carotid bifurcation. 5. Fetal type posterior cerebral artery is bilaterally. 6. 11 mm thyroid nodule. Consider further evaluation with thyroid ultrasound. If patient is clinically hyperthyroid, consider nuclear medicine thyroid uptake and scan. Thyroid ultrasound can be done following discharge. These results were called by telephone at the time of interpretation on 12/03/2016 at 9:00 pm to Dr. Noel ChristmasHARLES STEWART, who verbally acknowledged these results. Electronically Signed   By:  Marin Robertshristopher  Mattern M.D.   On: 12/03/2016 21:15   Ct Angio Neck W Or Wo Contrast  Result Date: 12/03/2016 CLINICAL DATA:  Right MCA territory infarct.  Migraine headaches. EXAM: CT ANGIOGRAPHY HEAD AND NECK TECHNIQUE: Multidetector CT imaging of the head and neck was performed using the standard protocol during bolus administration of intravenous contrast. Multiplanar CT image reconstructions and MIPs were obtained to evaluate the vascular anatomy. Carotid stenosis measurements (when applicable) are obtained utilizing NASCET criteria, using the distal internal carotid diameter as the denominator. CONTRAST:  50 mL Isovue 370 was administered into the right antecubital fossa with contrast extravasation. Contrast extravasation consultation: Type of contrast: Isovue 370 site of extravasation: Right antecubital fossa Estimated volume of extravasation: <50 ml Area of extravasation scanned with CT? no PATIENT'S SIGNS AND SYMPTOMS Skin blistering/ulceration: no Decrease capillary refill: no Change in skin color: no Decreased motor function or severe tightness: Unable to assess due to stroke Decreased pulses distal to site of extravasation: no Altered sensation: Unable to assess due to stroke Increasing pain or signs of increased swelling during observation: no TREATMENT Limb elevation: yes Ice packs applied: yes Heat pads applied: yes Plastic surgery consulted? no DOCUMENTATION AND FOLLOW-UP Site contrast extravasation forms submitted? yes Post extravasation orders completed? yes Was additional follow up assigned to PA's? Yes COMPARISON:  MRI brain from the same day at 11:55 a.m. CT head without contrast at 3:08 a.m. FINDINGS: CT HEAD FINDINGS Brain: Hypoattenuation involving the right temporal lobe, insular cortex, and basal ganglia matches the diffusion abnormality. There is effacement of the sulci. Midline shift is 2 mm. There is no hemorrhage. A hyperdense  right MCA is noted. No new infarcts are present.  Vascular: Hyperdense right MCA. Skull: The calvarium is intact. No focal lytic or blastic lesions are present. Sinuses: Minimal mucosal thickening is present in the left maxillary sinus. The remaining paranasal sinuses and the mastoid air cells are clear. Orbits: The globes and orbits are within normal limits. Review of the MIP images confirms the above findings CTA NECK FINDINGS Aortic arch: There is a common origin of the left common carotid artery and the innominate artery. There is no focal stenosis of the great vessel origins at the aorta. No significant vascular calcifications are present at the aorta. Right carotid system: The right common carotid artery is within normal limits. Soft tissue plaque is noted along the posterior proximal right internal carotid artery at the bifurcation. There is a shelf-like plaque. The lumen is narrowed to 3 mm maximally. The more distal right internal carotid artery is within normal limits. Left carotid system: The left common carotid artery is within normal limits. Atherosclerotic changes are noted at the bifurcation. The cervical left internal carotid artery is normal. Vertebral arteries: The vertebral arteries both originate from the subclavian arteries without significant stenoses. Both vertebral arteries are small without significant focal stenosis in the neck. Skeleton: Age advanced degenerative changes are noted at C5-6 and C6-7. Left greater than right uncovertebral spurring is present at C4-5. Vertebral body heights are maintained. No focal lytic or blastic lesions are present. Other neck: 11 mm inferior thyroid nodule is present on the left. No other discrete thyroid lesions are present. There is no significant adenopathy. No focal mucosal lesions are evident. The salivary glands are within normal limits. Upper chest: Ground-glass attenuation of the lung apices bilaterally likely reflects edema or atelectasis. No focal nodule or mass lesion is present. Review of the  MIP images confirms the above findings CTA HEAD FINDINGS Anterior circulation: Internal carotid artery's are within normal limits through the ICA termini bilaterally. The A1 segments are patent. Anterior communicating artery is patent. There is a proximal occlusion of the right M1 segment. The left M1 segment is within normal limits. The left MCA bifurcation is unremarkable. Left MCA and right ACA branch vessels are within normal limits. Anterior and superior right MCA branch vessels perfuse. Posterior and inferior branch vessels are occluded. Posterior circulation: The vertebral arteries are small. PICA origins are visualized and is normal. The basilar artery is small, terminating at the superior cerebellar artery is. Fetal type posterior cerebral arteries are present bilaterally. The PCA branch vessels are within normal limits. Venous sinuses: The dural sinuses are patent. The right transverse sinus is dominant. The straight sinus deep cerebral veins are intact. Cortical veins are within normal limits. Anatomic variants: Bilateral fetal type posterior cerebral artery is. Delayed phase: The infarct is again noted. No pathologic enhancement is evident. Review of the MIP images confirms the above findings IMPRESSION: 1. Proximal right M1 conclusion. 2. Expected evolution of right temporal lobe, insular cortex, and basal ganglia nonhemorrhagic infarction. 3. Focal irregularity at the right carotid bifurcation with a shelf-like soft tissue plaque but no significant stenosis relative to the more distal vessel. 4. More mild atherosclerotic changes at the left carotid bifurcation. 5. Fetal type posterior cerebral artery is bilaterally. 6. 11 mm thyroid nodule. Consider further evaluation with thyroid ultrasound. If patient is clinically hyperthyroid, consider nuclear medicine thyroid uptake and scan. Thyroid ultrasound can be done following discharge. These results were called by telephone at the time of interpretation on  12/03/2016 at  9:00 pm to Dr. Noel Christmas, who verbally acknowledged these results. Electronically Signed   By: Marin Roberts M.D.   On: 12/03/2016 21:15   Mr Brain Wo Contrast  Result Date: 12/03/2016 CLINICAL DATA:  Slurred speech and LEFT-sided weakness beginning earlier today. EXAM: MRI HEAD WITHOUT CONTRAST TECHNIQUE: Multiplanar, multiecho pulse sequences of the brain and surrounding structures were obtained without intravenous contrast. COMPARISON:  Noncontrast CT head earlier today. FINDINGS: Significant motion degradation. The study is diagnostic for the clinical indication, but small or subtle lesions could be overlooked. Brain: Large area of restricted diffusion, RIGHT MCA territory, involves the temporal lobe, small portion of the frontal lobe, insula, and basal ganglia, as well as the regional white matter. Findings consistent with acute cerebral infarction. This is not visible on prior CT. Early mass effect RIGHT to LEFT of 1-2 mm. Gradient sequence shows significant motion degradation, pre occluding definitive excess but of hemorrhage. No gross lobar hemorrhage. No mass lesion, hydrocephalus, or extra-axial fluid. Vascular: Suspected RIGHT MCA M1 stenosis or occlusion. This is difficult to confirm due to motion degradation. Skull and upper cervical spine: Normal marrow signal. Sinuses/Orbits: Negative. Other: None. IMPRESSION: Motion degraded exam. Large RIGHT MCA territory nonhemorrhagic infarct as described. There is early mass effect RIGHT-to-LEFT of 1-2 mm. Close follow-up recommended. Within limits for detection on this motion degraded exam, there is no gross hemorrhage, although small areas could be overlooked. Consider CT follow-up. Suspected RIGHT MCA M1 stenosis or occlusion. This could be further assessed with CT angiography of the head and neck, but only after the patient is more cooperative. These results were called by telephone at the time of interpretation on 12/03/2016 at  1:04 pm to Dr. Wilford Corner, who verbally acknowledged these results. Electronically Signed   By: Elsie Stain M.D.   On: 12/03/2016 13:05   Dg Swallowing Func-speech Pathology  Result Date: 12/03/2016 Objective Swallowing Evaluation: Type of Study: MBS-Modified Barium Swallow Study Patient Details Name: Icel Castles MRN: [id number] Date of Birth: 1976-03-29 Today's Date: 12/03/2016 Time: SLP Start Time (ACUTE ONLY): 1238-SLP Stop Time (ACUTE ONLY): 1252 SLP Time Calculation (min) (ACUTE ONLY): 14 min Past Medical History: Past Medical History: Diagnosis Date . Migraine  Past Surgical History: Past Surgical History: Procedure Laterality Date . CESAREAN SECTION Bilateral 1995, 2002 HPI: 41 y.o. female w hx of migraines was at home around 11pm c/o headache and then had altered mental status; BSE completed and pt kept NPO d/t overt s/s of aspiration including delayed throat clearing/cough with thin liquids; anterior left loss; CT negative; MRI pending No Data Recorded Assessment / Plan / Recommendation CHL IP CLINICAL IMPRESSIONS 12/03/2016 Clinical Impression Pt presents with mild-moderate oropharyngeal dysphagia characterized by premature spillage to pyriform sinuses prior to swallow triggering d/t sensory impairment; anterior loss noted with liquids either via tsp and/or cup; chin tuck and head turn left attempted with larger volumes of thin and nectar-thickened liquids, but was inconsistent d/t pt lethargy, subsequently, larger volumes of thin entered laryngeal vestibule with cup sips and to level of the vocal folds with straw sips and ejected out; no penetration and/or aspiration noted with nectar-thickened liquids via cup/straw, but pt is at risk with larger amounts; mechanical/soft consistency attempted, but removed from oral cavity d/t impaired mastication/poor lingual control/manipulation paired with pt lethargy; recommend Dysphagia 1 (puree) with nectar-thickened liquids via individual straw sips to reduce  anterior loss presented on right side pending pt alertness level and increased appetite with swallowing precautions in place during PO intake SLP  Visit Diagnosis Dysphagia, oropharyngeal phase (R13.12) Attention and concentration deficit following -- Frontal lobe and executive function deficit following -- Impact on safety and function Moderate aspiration risk   CHL IP TREATMENT RECOMMENDATION 12/03/2016 Treatment Recommendations Therapy as outlined in treatment plan below   Prognosis 12/03/2016 Prognosis for Safe Diet Advancement Good Barriers to Reach Goals -- Barriers/Prognosis Comment -- CHL IP DIET RECOMMENDATION 12/03/2016 SLP Diet Recommendations Dysphagia 1 (Puree) solids;Nectar thick liquid Liquid Administration via Cup;Straw;Other (Comment) Medication Administration Crushed with puree Compensations Slow rate;Small sips/bites;Lingual sweep for clearance of pocketing;Monitor for anterior loss Postural Changes --   CHL IP OTHER RECOMMENDATIONS 12/03/2016 Recommended Consults -- Oral Care Recommendations Oral care BID Other Recommendations Order thickener from pharmacy   CHL IP FOLLOW UP RECOMMENDATIONS 12/03/2016 Follow up Recommendations Other (comment)   CHL IP FREQUENCY AND DURATION 12/03/2016 Speech Therapy Frequency (ACUTE ONLY) min 2x/week Treatment Duration 1 week      CHL IP ORAL PHASE 12/03/2016 Oral Phase Impaired Oral - Pudding Teaspoon -- Oral - Pudding Cup -- Oral - Honey Teaspoon -- Oral - Honey Cup -- Oral - Nectar Teaspoon Left anterior bolus loss;Premature spillage Oral - Nectar Cup -- Oral - Nectar Straw Premature spillage Oral - Thin Teaspoon Left anterior bolus loss;Premature spillage Oral - Thin Cup Left anterior bolus loss;Premature spillage Oral - Thin Straw Premature spillage Oral - Puree Premature spillage Oral - Mech Soft -- Oral - Regular -- Oral - Multi-Consistency -- Oral - Pill -- Oral Phase - Comment (No Data)  CHL IP PHARYNGEAL PHASE 12/03/2016 Pharyngeal Phase Impaired Pharyngeal-  Pudding Teaspoon -- Pharyngeal -- Pharyngeal- Pudding Cup -- Pharyngeal -- Pharyngeal- Honey Teaspoon -- Pharyngeal -- Pharyngeal- Honey Cup -- Pharyngeal -- Pharyngeal- Nectar Teaspoon Delayed swallow initiation-pyriform sinuses Pharyngeal Material does not enter airway Pharyngeal- Nectar Cup Delayed swallow initiation-pyriform sinuses Pharyngeal Material does not enter airway Pharyngeal- Nectar Straw Delayed swallow initiation-pyriform sinuses Pharyngeal Material does not enter airway Pharyngeal- Thin Teaspoon Delayed swallow initiation-pyriform sinuses Pharyngeal Material enters airway, remains ABOVE vocal cords then ejected out Pharyngeal- Thin Cup Delayed swallow initiation-pyriform sinuses;Compensatory strategies attempted (chin tuck; head turn left) Pharyngeal Material enters airway, remains ABOVE vocal cords then ejected out Pharyngeal- Thin Straw Delayed swallow initiation-pyriform sinuses;Penetration/Aspiration before swallow;Trace aspiration;Compensatory strategies attempted (chin tuck/head turn left) Pharyngeal Material enters airway, CONTACTS cords and then ejected out Pharyngeal- Puree Delayed swallow initiation-vallecula Pharyngeal -- Pharyngeal- Mechanical Soft NT Pharyngeal -- Pharyngeal- Regular -- Pharyngeal -- Pharyngeal- Multi-consistency -- Pharyngeal -- Pharyngeal- Pill -- Pharyngeal -- Pharyngeal Comment (No Data)  CHL IP CERVICAL ESOPHAGEAL PHASE 12/03/2016 Cervical Esophageal Phase WFL Pudding Teaspoon -- Pudding Cup -- Honey Teaspoon -- Honey Cup -- Nectar Teaspoon -- Nectar Cup -- Nectar Straw -- Thin Teaspoon -- Thin Cup -- Thin Straw -- Puree -- Mechanical Soft -- Regular -- Multi-consistency -- Pill -- Cervical Esophageal Comment -- CHL IP GO 12/03/2016 Functional Assessment Tool Used NOMS; clinical judgment Functional Limitations Swallowing Swallow Current Status (Z3664) CK Swallow Goal Status (Q0347) CJ Swallow Discharge Status (Q2595) (None) Motor Speech Current Status (G3875) (None)  Motor Speech Goal Status (I4332) (None) Motor Speech Goal Status (R5188) (None) Spoken Language Comprehension Current Status (C1660) (None) Spoken Language Comprehension Goal Status (Y3016) (None) Spoken Language Comprehension Discharge Status (W1093) (None) Spoken Language Expression Current Status (A3557) (None) Spoken Language Expression Goal Status (D2202) (None) Spoken Language Expression Discharge Status (R4270) (None) Attention Current Status (W2376) (None) Attention Goal Status (E8315) (None) Attention Discharge Status (V7616) (None) Memory Current Status (W7371) (None)  Memory Goal Status [phone]) (None) Memory Discharge Status [phone]) (None) Voice Current Status [phone]) (None) Voice Goal Status (O1308) (None) Voice Discharge Status [phone]) (None) Other Speech-Language Pathology Functional Limitation Current Status [phone]) (None) Other Speech-Language Pathology Functional Limitation Goal Status (B2841) (None) Other Speech-Language Pathology Functional Limitation Discharge Status [phone]) (None) Tressie Stalker, M.S., CCC-SLP 12/03/2016, 1:18 PM                Assessment/Plan: Diagnosis: Right MCA territory nonhemorrhagic infarct Labs and images independently reviewed.  Records reviewed and summated above. Stroke: Continue secondary stroke prophylaxis and Risk Factor Modification listed below:   Antiplatelet therapy:   Blood Pressure Management:  Continue current medication with prn's with permisive HTN per primary team Statin Agent:   Preiabetes management:   Left sided hemiparesis: fit for orthosis to prevent contractures (resting hand splint for day, wrist cock up splint at night, PRAFO, etc) Motor recovery: Fluoxetine  1. Does the need for close, 24 hr/day medical supervision in concert with the patient's rehab needs make it unreasonable for this patient to be served in a less intensive setting? Likely  2. Co-Morbidities requiring supervision/potential complications: migraine (Biofeedback  training with therapies to help reduce reliance on opiate pain medications, monitor pain control during therapies, and sedation at rest and titrate to maximum efficacy to ensure participation and gains in therapies), pain (Biofeedback training with therapies to help reduce reliance on opiate and IV pain medications, particularly IV toradol, monitor pain control during therapies, and sedation at rest and titrate to maximum efficacy to ensure participation and gains in therapies), prediabetes (Monitor in accordance with exercise and adjust meds as necessary), dysphagia (advance diet as tolerated) 3. Due to bladder management, safety, disease management, medication administration and patient education, does the patient require 24 hr/day rehab nursing? Yes 4. Does the patient require coordinated care of a physician, rehab nurse, PT (1-2 hrs/day, 5 days/week), OT (1-2 hrs/day, 5 days/week) and SLP (1-2 hrs/day, 5 days/week) to address physical and functional deficits in the context of the above medical diagnosis(es)? Yes Addressing deficits in the following areas: balance, endurance, locomotion, strength, transferring, bowel/bladder control, bathing, dressing, toileting, cognition, speech, swallowing and psychosocial support 5. Can the patient actively participate in an intensive therapy program of at least 3 hrs of therapy per day at least 5 days per week? Potentially 6. The potential for patient to make measurable gains while on inpatient rehab is excellent 7. Anticipated functional outcomes upon discharge from inpatient rehab are TBD  with PT, TBD with OT, TBD with SLP. 8. Estimated rehab length of stay to reach the above functional goals is: TBD 9. Anticipated D/C setting: Home 10. Anticipated post D/C treatments: HH therapy and Home excercise program 11. Overall Rehab/Functional Prognosis: good  RECOMMENDATIONS: This patient's condition is appropriate for continued rehabilitative care in the following  setting: Likely CIR.  Will await completion of medical workup and eval by therapies.  Patient has agreed to participate in recommended program. Potentially Note that insurance prior authorization may be required for reimbursement for recommended care.  Comment: Rehab Admissions Coordinator to follow up.  Maryla Morrow, MD, [place] Dom Charlton Amor., PA-C 12/05/2016    Revision History                   Routing History

## 2016-12-12 NOTE — Progress Notes (Signed)
Orthopedic Tech Progress Note Patient Details:  Arlyce DiceCynthia Topp 03/20/1976 [id number]  Patient ID: Arlyce Diceynthia Goulette, female   DOB: 06/24/1975, 41 y.o.   MRN: [id number]   Saul FordyceJennifer C Andrews Tener 12/12/2016, 10:40 AMCalled [facility] for left resting hand splint and left PRAFO boot.

## 2016-12-12 NOTE — Progress Notes (Signed)
Restful and uneventful throughout shift, easily arosed with no complaints or distress

## 2016-12-12 NOTE — Progress Notes (Signed)
Physical Therapy Session Note  Patient Details  Name: Shannon Erickson MRN: [id number] Date of Birth: 09/16/1975  Today's Date: 12/12/2016 PT Individual Time: 1100-1200 PT Individual Time Calculation (min): 60 min   Short Term Goals: Week 1:  PT Short Term Goal 1 (Week 1): Pt will perform bed mobility minA PT Short Term Goal 2 (Week 1): Pt will perform transfers w/c <>bed consistent modA PT Short Term Goal 3 (Week 1): Pt will ambulate x25' maxA  PT Short Term Goal 4 (Week 1): Pt will demonstrate dynamic sitting balance with S x5 min  Skilled Therapeutic Interventions/Progress Updates:    no c/o pain. PT treatment session focused on NMR of L side during functional tasks during standing and sitting.  Pt supine in bed upon arrival reporting increased dizziness earlier while sitting in chair. Pt agreeable to PT treatment session. Pt BP: supine 113/67 > sitting 125/72 with no symptoms reported. Pt performed stand pivot transfer bed to w/c to mat without AD with mod assist for pivoting and steadying. Pt performed stand x4 from mat without AD, mod assist for trunk control. PT provided verbal and tactile cues for upright posture, L LE weight bearing, and facilitation at L knee for extension. Pt performed x3 stand from mat with platform RW with assist for L UE placement with verbal and tactile cues for L weight shifting and placement of R LE to increase L LE weight bearing. Pt performed sitting UE task with min guard assist for steadying focused on R trunk elongation, L lateral trunk flexion, and cross body reaching progressing to L UE weight bearing with R UE cross body reaching. PT placed towel under R side of w/c cushion for proper trunk alignment. Pt returned to room in w/c requesting to use bathroom. Pt stand pivot w/c to toilet using grab bars with mod assist for lifting and pivoting. Pt left sitting on toilet with daughter present and nurse tech notified.   Therapy Documentation Precautions:   Precautions Precautions: Fall Precaution Comments: L hemiplegia Restrictions Weight Bearing Restrictions: Yes   See Function Navigator for Current Functional Status.   Therapy/Group: Individual Therapy  Million Maharaj 12/12/2016, 12:31 PM

## 2016-12-12 NOTE — Progress Notes (Signed)
Patient information reviewed and entered into e[facility] system by Mirranda Monrroy, RN, CRRN, PPS Coordinator.  Information including medical coding and functional independence measure will be reviewed and updated through discharge.    

## 2016-12-12 NOTE — Progress Notes (Signed)
Subjective/Complaints: No issues overnite more awake this am ROS- lethargic, unable to obtain  Objective: Vital Signs: Blood pressure 105/66, pulse 79, temperature 98.9 F (37.2 C), temperature source Oral, resp. rate 18, height 5' 3" (1.6 m), weight 89.4 kg (197 lb), SpO2 100 %. No results found. Results for orders placed or performed during the hospital encounter of 12/10/16 (from the past 72 hour(s))  Urine culture     Status: Abnormal   Collection Time: 12/10/16  4:29 PM  Result Value Ref Range   Specimen Description URINE, RANDOM    Special Requests NONE    Culture MULTIPLE SPECIES PRESENT, SUGGEST RECOLLECTION (A)    Report Status 12/11/2016 FINAL   Urinalysis, Routine w reflex microscopic     Status: Abnormal   Collection Time: 12/10/16  6:26 PM  Result Value Ref Range   Color, Urine YELLOW YELLOW   APPearance CLEAR CLEAR   Specific Gravity, Urine 1.012 1.005 - 1.030   pH 7.0 5.0 - 8.0   Glucose, UA NEGATIVE NEGATIVE mg/dL   Hgb urine dipstick LARGE (A) NEGATIVE   Bilirubin Urine NEGATIVE NEGATIVE   Ketones, ur NEGATIVE NEGATIVE mg/dL   Protein, ur NEGATIVE NEGATIVE mg/dL   Nitrite NEGATIVE NEGATIVE   Leukocytes, UA NEGATIVE NEGATIVE   RBC / HPF 6-30 0 - 5 RBC/hpf   WBC, UA 0-5 0 - 5 WBC/hpf   Bacteria, UA RARE (A) NONE SEEN   Squamous Epithelial / LPF 0-5 (A) NONE SEEN  CBC WITH DIFFERENTIAL     Status: None   Collection Time: 12/11/16  4:43 AM  Result Value Ref Range   WBC 8.6 4.0 - 10.5 K/uL   RBC 4.81 3.87 - 5.11 MIL/uL   Hemoglobin 13.3 12.0 - 15.0 g/dL   HCT 39.7 36.0 - 46.0 %   MCV 82.5 78.0 - 100.0 fL   MCH 27.7 26.0 - 34.0 pg   MCHC 33.5 30.0 - 36.0 g/dL   RDW 13.4 11.5 - 15.5 %   Platelets 272 150 - 400 K/uL   Neutrophils Relative % 56 %   Neutro Abs 4.9 1.7 - 7.7 K/uL   Lymphocytes Relative 32 %   Lymphs Abs 2.7 0.7 - 4.0 K/uL   Monocytes Relative 9 %   Monocytes Absolute 0.8 0.1 - 1.0 K/uL   Eosinophils Relative 2 %   Eosinophils Absolute  0.2 0.0 - 0.7 K/uL   Basophils Relative 1 %   Basophils Absolute 0.0 0.0 - 0.1 K/uL  Comprehensive metabolic panel     Status: Abnormal   Collection Time: 12/11/16  4:43 AM  Result Value Ref Range   Sodium 139 135 - 145 mmol/L   Potassium 3.7 3.5 - 5.1 mmol/L   Chloride 111 101 - 111 mmol/L   CO2 20 (L) 22 - 32 mmol/L   Glucose, Bld 120 (H) 65 - 99 mg/dL   BUN 13 6 - 20 mg/dL   Creatinine, Ser 0.83 0.44 - 1.00 mg/dL   Calcium 8.5 (L) 8.9 - 10.3 mg/dL   Total Protein 6.5 6.5 - 8.1 g/dL   Albumin 2.5 (L) 3.5 - 5.0 g/dL   AST 41 15 - 41 U/L   ALT 77 (H) 14 - 54 U/L   Alkaline Phosphatase 102 38 - 126 U/L   Total Bilirubin 0.7 0.3 - 1.2 mg/dL   GFR calc non Af Amer >60 >60 mL/min   GFR calc Af Amer >60 >60 mL/min    Comment: (NOTE) The eGFR has been calculated  using the CKD EPI equation. This calculation has not been validated in all clinical situations. eGFR's persistently <60 mL/min signify possible Chronic Kidney Disease.    Anion gap 8 5 - 15     HEENT: normal Cardio: RRR and no murmur Resp: CTA B/L and unlabored GI: BS positive and NT, ND Extremity:  No Edema Skin:   Intact Neuro: Lethargic, Abnormal Sensory reduced Left side, Abnormal Motor 3-/5 Left HF KE, 0 at ankle , 0 in LUE, increaseed finger flexor tone and Inattention Musc/Skel:  Normal Gen NAD   Assessment/Plan: 1. Functional deficits secondary to Right MCA infarct which require 3+ hours per day of interdisciplinary therapy in a comprehensive inpatient rehab setting. Physiatrist is providing close team supervision and 24 hour management of active medical problems listed below. Physiatrist and rehab team continue to assess barriers to discharge/monitor patient progress toward functional and medical goals. FIM: Function - Bathing Position: Shower Body parts bathed by patient: Left arm, Chest, Abdomen, Front perineal area, Right upper leg, Left upper leg Body parts bathed by helper: Right arm, Buttocks, Left  upper leg, Right lower leg, Back Assist Level: Touching or steadying assistance(Pt > 75%)  Function- Upper Body Dressing/Undressing What is the patient wearing?: Pull over shirt/dress Pull over shirt/dress - Perfomed by patient: Thread/unthread right sleeve, Put head through opening Pull over shirt/dress - Perfomed by helper: Pull shirt over trunk, Thread/unthread left sleeve Assist Level:  (MOD A) Function - Lower Body Dressing/Undressing What is the patient wearing?: Underwear, Socks, Pants Position: Wheelchair/chair at sink Underwear - Performed by patient: Thread/unthread right underwear leg Underwear - Performed by helper: Thread/unthread left underwear leg, Pull underwear up/down (helper help LLE in figure 4) Pants- Performed by patient: Thread/unthread right pants leg Pants- Performed by helper: Thread/unthread left pants leg, Pull pants up/down (helper held LLE in figure 4) Socks - Performed by patient: Don/doff right sock Socks - Performed by helper: Don/doff left sock (held LLE in figure 4) Assist for footwear: Partial/moderate assist Assist for lower body dressing:  (MAX A)  Function - Toileting Toileting activity did not occur: No continent bowel/bladder event Toileting steps completed by patient: Adjust clothing after toileting Toileting steps completed by helper: Adjust clothing after toileting Toileting Assistive Devices: Toilet aid Assist level: Touching or steadying assistance (Pt.75%)  Function - Toilet Transfers Assist level to toilet: Maximal assist (Pt 25 - 49%/lift and lower) Assist level from toilet: Maximal assist (Pt 25 - 49%/lift and lower)  Function - Chair/bed transfer Chair/bed transfer method: Stand pivot Chair/bed transfer assist level: Maximal assist (Pt 25 - 49%/lift and lower) Chair/bed transfer assistive device: Armrests Chair/bed transfer details: Verbal cues for sequencing, Verbal cues for technique, Verbal cues for precautions/safety, Visual  cues/gestures for sequencing, Tactile cues for placement, Tactile cues for weight shifting, Tactile cues for sequencing, Manual facilitation for placement, Manual facilitation for weight shifting, Manual facilitation for weight bearing  Function - Locomotion: Wheelchair Will patient use wheelchair at discharge?:  (TBD) Function - Locomotion: Ambulation Assistive device: Rail in hallway Max distance: 25 Assist level: 2 helpers Assist level: 2 helpers Walk 50 feet with 2 turns activity did not occur: Safety/medical concerns Walk 150 feet activity did not occur: Safety/medical concerns Walk 10 feet on uneven surfaces activity did not occur: Safety/medical concerns  Function - Comprehension Comprehension: Auditory Comprehension assist level: Follows basic conversation/direction with no assist  Function - Expression Expression: Verbal Expression assist level: Expresses basic 90% of the time/requires cueing < 10% of the time.  Function -  Social Interaction Social Interaction assist level: Interacts appropriately 75 - 89% of the time - Needs redirection for appropriate language or to initiate interaction.  Function - Problem Solving Problem solving assist level: Solves basic 90% of the time/requires cueing < 10% of the time  Function - Memory Memory assist level: More than reasonable amount of time Patient normally able to recall (first 3 days only): Current season, Location of own room, Staff names and faces, That he or she is in a hospital   Medical Problem List and Plan: 1. Left hemiparesis secondary to Right MCA infarct- ASA and Plavix CIR PT, OT, SLP evals  2. DVT Prophylaxis/Anticoagulation: Pharmaceutical: Lovenox 3. Migraine Headaches/Pain Management: has been getting Toradol since 7/14. On low dose topamax--will change to nights to help with sedation and may need titrated upwards. .  4. Mood: LCSW to follow for evaluation and support.  5. Neuropsych: This patient is not  fullycapable of making decisions on herown behalf. 6. Skin/Wound Care: routine pressure relief measures  7. Fluids/Electrolytes/Nutrition: Liquids advanced to thins --d/c IVF. Monitor I/O.  Lytes normal 7/22, low alb will supplement with prostat 8. Dyslipidemia: On Lipitor. 9. Thyroid nodule: follow up on outpatient basis 10. Obesity: Educate on importance of weight loss with appropriate diet to promote health and mobility.  11. Question antiphospholipid syndrome: high Beta 2-glycoprotein IgG. Will need recheck labs in 12 weeks. 12.  Spasticity L Finger flexors resting hand splint LOS (Days) 2 A FACE TO FACE EVALUATION WAS PERFORMED  KIRSTEINS,ANDREW E 12/12/2016, 7:12 AM

## 2016-12-12 NOTE — Evaluation (Signed)
Speech Language Pathology Assessment and Plan  Patient Details  Name: Shannon Erickson MRN: [id number] Date of Birth: 01-23-1976  SLP Diagnosis: Cognitive Impairments;Dysphagia  Rehab Potential: Excellent ELOS: 21 to 25 days    Today's Date: 12/12/2016 SLP Individual Time: 0730-0830 SLP Individual Time Calculation (min): 60 min   Problem List:  Patient Active Problem List   Diagnosis Date Noted  . Stroke due to occlusion of right middle cerebral artery ([place]) 12/10/2016  . Gait disturbance, post-stroke 12/10/2016  . Hemiparesis affecting left side as late effect of cerebrovascular accident ([place])   . Left-sided visual neglect   . Antiphospholipid syndrome ([place])   . Pain   . Prediabetes   . Dysphagia, post-stroke   . Carotid atherosclerosis, bilateral   . Thyroid nodule   . Hyperlipidemia   . Chronic migraine without aura without status migrainosus, not intractable   . Headache 12/03/2016  . Hypokalemia 12/03/2016  . Stroke (cerebrum) ([place]) 12/03/2016  . Left-sided weakness    Past Medical History:  Past Medical History:  Diagnosis Date  . Lesion of left ulnar nerve   . Meningitis 1995  . Migraine    Past Surgical History:  Past Surgical History:  Procedure Laterality Date  . CESAREAN SECTION Bilateral 1995, 2002  . NO PAST SURGERIES    . TEE WITHOUT CARDIOVERSION N/A 12/05/2016   Procedure: TRANSESOPHAGEAL ECHOCARDIOGRAM (TEE);  Surgeon: Acie Fredrickson Wonda Cheng, MD;  Location: [facility] ENDOSCOPY;  Service: Cardiovascular;  Laterality: N/A;    Assessment / Plan / Recommendation Clinical Impression Shannon Erickson is a 41 year old female with history of meningitis, migraines who was admitted on 12/03/16 with severe right frontoparietal HA, slurred speech, lethargyand left sided weakness. CT head negative. MRI brain done revealing "Large RIGHT MCA territory nonhemorrhagic infarct involving temporal lobe, frontal lobe, insula, basal ganglia and regional white matter with early mass  effect and 1-2 mm. EEG done showing right hemispheric slowing with diffuse cerebral dysfunction due to encephalopathy. BLE dopplers were negative for DVT. TEE done revealing EF 50% with no thrombus, no PFO or ASD. CTA neck showed focal irregularity at right carotid bifurcation with self like plaque and 11 mm thyroid nodule. Stroke felt to be embolic from R-ICA plaque and Dr. Erlinda Hong recommended ASA/Plavix X 3 months followed by Plavix alone. Follow up CT head 7/19 stable and lethargy resolving with improvement in headaches. Coagulopathy panel with high beta 2 glycoprotein IgG and will need repeat test in 12 weeks due question of antiphospholipid syndrome. Patient with resultant left sided weakness, left inattention, delayed processing with cognitive deficits and dysphagia. CIR recommended by rehab team.    Comprehensive cognitive linguistic and bedside swallow evaluations completed on 12/12/16. Pt presents with mild oropharyngeal dysphagia c/b subtle thorat clear x 2 with consumption of dysphagia 3 breakfast with thin liquids via straw. Pt with no left buccal pocketing. Pt obtained score of 23 out of 30 on MOCA version 8.2 with deficits in semi-complex problem solving, recall of new information, delayed processing, anticipatory awareness and decreased attention to left. Additionally, pt with decreased speech intelligibility d/t vocal intensity. Pt with speech intelligibility of ~ 75% at the sentence level. Skilled ST is required to address the above mentioned deficits to increase functional independence and reduce caregiver burden. Anticipate that pt will require 24 hour supervision and follow-up Outpatient ST at discharge.   Skilled Therapeutic Interventions          Skilled treatment session focused on completion of cognitive linguistic and bedside swallow evaluation, see  above.    SLP Assessment  Patient will need skilled Speech Lanaguage Pathology Services during CIR admission    Recommendations  SLP  Diet Recommendations: Dysphagia 3 (Mech soft);Thin Liquid Administration via: Straw;Cup Medication Administration: Whole meds with puree Supervision: Patient able to self feed;Intermittent supervision to cue for compensatory strategies Compensations: Minimize environmental distractions;Slow rate;Small sips/bites;Lingual sweep for clearance of pocketing Postural Changes and/or Swallow Maneuvers: Seated upright 90 degrees;Upright 30-60 min after meal Oral Care Recommendations: Oral care BID Recommendations for Other Services: Neuropsych consult Patient destination: Home Follow up Recommendations: 24 hour supervision/assistance;Outpatient SLP;Home Health SLP Equipment Recommended: None recommended by SLP    SLP Frequency 3 to 5 out of 7 days   SLP Duration  SLP Intensity  SLP Treatment/Interventions 21 to 25 days  Minumum of 1-2 x/day, 30 to 90 minutes  Cognitive remediation/compensation;Internal/external aids;Functional tasks;Dysphagia/aspiration precaution training;Medication managment;Patient/family education;Speech/Language facilitation;Therapeutic Activities    Pain Pain Assessment Pain Assessment: No/denies pain  Prior Functioning Cognitive/Linguistic Baseline: Within functional limits Type of Home: House  Lives With: Family Available Help at Discharge: Family 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 Needs helper to occlude  trach/needs to repeat words.  Social Interaction Social Interaction assist level: Interacts appropriately 75 - 89% of the time - Needs redirection for appropriate language or to initiate interaction.  Problem Solving Problem solving assist level: Solves basic 90% of the time/requires cueing < 10% of the time  Memory Memory assist level: More than reasonable amount of time;Recognizes or recalls 90% of the time/requires cueing < 10% of the time   Short Term Goals: Week 1: SLP Short Term Goal 1 (Week 1): Pt will consume regular diet textures without overt s/s of aspiration and Mod I use of compensatory swallow strategies.  SLP Short Term Goal 2 (Week 1): Pt will increase use speech intelligibility strategies to increase vocal intensity to achieve ~ 80% intelligibility at the sentence level. with Mod A cues. SLP Short Term Goal 3 (Week 1): Pt will use external memory aids to recall new daily information with Mod A cues.  SLP Short Term Goal 4 (Week 1): Pt will complete semi-complex problem solving tasks with Mod A cues.  SLP Short Term Goal 5 (Week 1): Pt will scan to left of her environment with Mod A cues to locate objects during functional tasks.   Refer to Care Plan for Long Term Goals  Recommendations for other services: Neuropsych  Discharge Criteria: Patient will be discharged from SLP if patient refuses treatment 3 consecutive times without medical reason, if treatment goals not met, if there is a change in medical status, if patient makes no progress towards goals or if patient is discharged from hospital.  The above assessment, treatment plan, treatment alternatives and goals were discussed and mutually agreed upon: by patient and by family  Taijah Macrae 12/12/2016, 12:51 PM

## 2016-12-12 NOTE — Progress Notes (Signed)
Occupational Therapy Session Note  Patient Details  Name: Shannon Erickson MRN: [id number] Date of Birth: 10/13/1975  Today's Date: 12/12/2016 OT Individual Time: 0900-1000 OT Individual Time Calculation (min): 60 min    Short Term Goals: Week 1:  OT Short Term Goal 1 (Week 1): Pt will sit to stand consistantly with MOD A in prep for clothing management OT Short Term Goal 2 (Week 1): Pt will maintain midline during bathing tasks while seated with supervision OT Short Term Goal 3 (Week 1): Pt will locate all needed grooming items on L of sink with MIN VC OT Short Term Goal 4 (Week 1): Pt will recall hemi techniques for UB dressing for 2 consecutive sessions  OT Short Term Goal 5 (Week 1): [facility] A and [facility] PRN.  Skilled Therapeutic Interventions/Progress Updates:    Treatment session focused on ADl/self care training, AE/AD training, safety awareness, NMR techniques, transfer training, and balance training. Upon entering, Pt supine in bed with HOB elevated and agreeable for AM ADLs. Pt instructed on bed mob techniques and L sided awareness during EOB sit. She tolerated sitting EOB with min A and was instructed on SPT to w/c with mod A and v/c for hand/foot placement. Noted impulsivity with transfer. Pt completed shower transfer from w/c to shower chair with mod A, L LE stability with tx. Pt instructed on hemi d/b techniques, requiring assistance to wash under L Ue and R UE. Pt completed grooming tasks at sink in standing with mod A for standing balance. Pt instructed on weight shifting and posture when standing. Pt easily fatigued and returned to sitting upright in w/c w. Verbal cues for transfer safety techniques. Pt left resting up in wc with call bell in reach.   Therapy Documentation Precautions:  Precautions Precautions: Fall Precaution Comments: L hemiplegia Restrictions Weight Bearing Restrictions: Yes General:   Vital Signs:   Pain: Pain  Assessment Pain Assessment: No/denies pain ADL:   Vision   Perception    Praxis   Exercises:   Other Treatments:    See Function Navigator for Current Functional Status.   Therapy/Group: Individual Therapy  Shannon Erickson 12/12/2016, 12:26 PM

## 2016-12-12 NOTE — Progress Notes (Signed)
Trish Mage, RN Rehab Admission Coordinator Signed Physical Medicine and Rehabilitation  PMR Pre-admission Date of Service: 12/09/2016 10:12 AM  Related encounter: ED to Hosp-Admission (Discharged) from 12/03/2016 in [facility]       [] Hide copied text PMR Admission Coordinator Pre-Admission Assessment  Patient: Shannon Erickson is an 41 y.o., female MRN: [id number] DOB: Jan 20, 1976 Height: 5\' 3"  (160 cm) Weight: 89.5 kg (197 lb 5 oz)                                                                                                                                                  Insurance Information Self pay with medicaid application pending  Medicaid Application Date:  Pending      Case Manager:   Disability Application Date: Pending      Case Worker:    Emergency Contact Information        Contact Information    Name Relation Home Work Mobile   [employer] Daughter   [phone]   Juliannah, Ohmann   [phone]   Jeraldean, Wechter Father   [phone]   Pitzer,[name] Relative   [phone]     Current Medical History  Patient Admitting Diagnosis:Right MCA territory nonhemorrhagic infarct   History of Present Illness: A 41 y.o.right handed femalewith history of migraine headaches.Per chart review and daughter-in-law, patient lives with family. Two-level home with bedroom first floor. Independent prior to admission.Presented 12/03/2016 with left-sided weakness and jerking movement as well as headache. CT/MRI reviewed, showing right MCA CVA. Per report, large right MCA territory nonhemorrhagic infarct. Patient did not receive TPA. CT Angio head and neck showed a proximal right M1 occlusion. Incidental finding of a 11 mm thyroid nodule. Patient did not receive TPA. Echocardiogram with ejection fraction of 65% no wall motion abnormalities. EEG with diffuse slowingher activity.Venous Doppler studies lower  extremity negative. TEE showed no PFO or ASD.Aspirin and Plavix for CVA prophylaxis. Subcutaneous Lovenox for DVT prophylaxis. Dysphagia #3 nectar thick liquid diet.Formal physical and occupational therapy evaluations completed the recommendations of physical medicine rehabilitation consult. Patient to be admitted for comprehensive inpatient rehabilitation program.  Total: 12=NIH  Past Medical History      Past Medical History:  Diagnosis Date  . Migraine     Family History  family history is not on file.  Prior Rehab/Hospitalizations: No previous rehab admissions.  Has the patient had major surgery during 100 days prior to admission? No  Current Medications   Current Facility-Administered Medications:  .  0.9 %  sodium chloride infusion, , Intravenous, Continuous, Rodolph Bong, MD, Last Rate: 125 mL/hr at 12/07/16 1812 .  acetaminophen (TYLENOL) tablet 650 mg, 650 mg, Oral, Q6H PRN, Rai, Ripudeep K, MD .  aspirin tablet 325 mg, 325 mg, Oral, Daily, Marvel Plan, MD, 325 mg at 12/08/16 1049 .  atorvastatin (LIPITOR) tablet  80 mg, 80 mg, Oral, q1800, Marvel PlanXu, Jindong, MD, 80 mg at 12/08/16 1731 .  clopidogrel (PLAVIX) tablet 75 mg, 75 mg, Oral, Daily, Marvel PlanXu, Jindong, MD, 75 mg at 12/08/16 1049 .  cyanocobalamin ((VITAMIN B-12)) injection 1,000 mcg, 1,000 mcg, Intramuscular, Daily, Rodolph Bonghompson, Daniel V, MD, 1,000 mcg at 12/08/16 1049 .  enoxaparin (LOVENOX) injection 40 mg, 40 mg, Subcutaneous, Q24H, Pearson GrippeKim, James, MD, 40 mg at 12/08/16 1731 .  ketorolac (TORADOL) 15 MG/ML injection 15 mg, 15 mg, Intravenous, Q6H PRN, Rai, Ripudeep K, MD, 15 mg at 12/09/16 0044 .  RESOURCE THICKENUP CLEAR, , Oral, PRN, Rodolph Bonghompson, Daniel V, MD .  sodium chloride flush (NS) 0.9 % injection 3 mL, 3 mL, Intravenous, Q12H, Pearson GrippeKim, James, MD, 3 mL at 12/08/16 2213 .  topiramate (TOPAMAX) tablet 50 mg, 50 mg, Oral, Daily, Eshraghi, Shervin, MD, 50 mg at 12/08/16 1049  Patients Current Diet: DIET DYS 3 Room  service appropriate? Yes with Assist; Fluid consistency: Thin  Precautions / Restrictions Precautions Precautions: Fall Restrictions Weight Bearing Restrictions: No   Has the patient had 2 or more falls or a fall with injury in the past year?No  Prior Activity Level Community (5-7x/wk): Went out daily.  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  Home Assistive Devices / Equipment Home Assistive Devices/Equipment: None Home Equipment: None  Prior Device Use: Indicate devices/aids used by the patient prior to current illness, exacerbation or injury? None  Prior Functional Level Prior Function Level of Independence: Independent Comments: works full time, very independent  Self Care: Did the patient need help bathing, dressing, using the toilet or eating?  Independent  Indoor Mobility: Did the patient need assistance with walking from room to room (with or without device)? Independent  Stairs: Did the patient need assistance with internal or external stairs (with or without device)? Independent  Functional Cognition: Did the patient need help planning regular tasks such as shopping or remembering to take medications? Independent  Current Functional Level Cognition  Arousal/Alertness: Awake/alert Overall Cognitive Status: Impaired/Different from baseline Current Attention Level: Sustained Orientation Level: Oriented X4 Following Commands: Follows one step commands consistently General Comments: pt continues to have L inattention of body and environment. Pt required frequent multimodal cueing to attend to L. Attention: Sustained, Selective Sustained Attention: Appears intact Selective Attention: Impaired Selective Attention Impairment: Functional basic Memory: Impaired Memory Impairment: Retrieval deficit Problem Solving: Impaired Safety/Judgment: Appears intact    Extremity Assessment (includes Sensation/Coordination)  Upper Extremity  Assessment: LUE deficits/detail LUE Deficits / Details: Inattention to L UE, increased tone, and decreased active movement on command. Pt with slight movement of L UE during testing of righting reactions.  LUE Sensation: decreased proprioception, decreased light touch LUE Coordination: decreased fine motor, decreased gross motor  Lower Extremity Assessment: Defer to PT evaluation LLE Deficits / Details: noted left side inattention, increased tone, decreased awareness and poor strength/coordination. Increased tone kicks in with task performance LLE Sensation: decreased light touch, decreased proprioception LLE Coordination: decreased fine motor, decreased gross motor    ADLs  Overall ADL's : Needs assistance/impaired Grooming: Moderate assistance, Sitting Upper Body Bathing: Moderate assistance, Sitting Lower Body Bathing: Maximal assistance, Sit to/from stand, +2 for physical assistance Upper Body Dressing : Moderate assistance, Sitting Lower Body Dressing: Maximal assistance, Sit to/from stand, +2 for physical assistance Toilet Transfer: +2 for physical assistance, Stand-pivot, Maximal assistance Toileting- Clothing Manipulation and Hygiene: Maximal assistance, +2 for physical assistance, Sit to/from stand Functional mobility during ADLs: Moderate assistance, +2 for physical  assistance (stand-pivot only) General ADL Comments: Max assist +2 to maintain balance during pericare. Upon standing for simulated toilet transfer, pt with incontinence of bowel.     Mobility  Overal bed mobility: Needs Assistance Bed Mobility: Rolling, Sidelying to Sit Rolling: Min assist Sidelying to sit: Min assist Supine to sit: Mod assist General bed mobility comments: pt required increased time, verbal and tactile cueing for sequencing, use of bed rails, assist to bring bilateral LEs off of bed. Pt able to use R UE to elevate trunk and achieve sitting EOB    Transfers  Overall transfer level: Needs  assistance Equipment used: 2 person hand held assist Transfers: Sit to/from Stand, Stand Pivot Transfers Sit to Stand: Mod assist, +2 safety/equipment, +2 physical assistance (one with gait belt on R side, one assisting at L LE) Stand pivot transfers: Max assist, +2 physical assistance General transfer comment: pt unable to achieve L knee extension or L foot in complete contact with the floor even with physical assistance secondary to increased flexor tone.     Ambulation / Gait / Stairs / Engineer, drilling / Balance Dynamic Sitting Balance Sitting balance - Comments: pt able to sit EOB with R UE support and min guard for safety Balance Overall balance assessment: Needs assistance Sitting-balance support: Feet supported, Single extremity supported Sitting balance-Leahy Scale: Poor Sitting balance - Comments: pt able to sit EOB with R UE support and min guard for safety Postural control: Left lateral lean Standing balance support: Bilateral upper extremity supported, During functional activity Standing balance-Leahy Scale: Poor Standing balance comment: mod A x2 to maintain upright standing    Special needs/care consideration BiPAP/CPAP No CPM No Continuous Drip IV 0.9% NS 125 mL/hr Dialysis No     Life Vest No Oxygen No Special Bed No Trach Size No Wound Vac (area) No     Skin No                            Bowel mgmt: Last BM 12/08/16 Bladder mgmt: Incontinence Diabetic mgmt No    Previous Home Environment Living Arrangements: Children, Other relatives Available Help at Discharge: Family Type of Home: House Home Layout: Two level, Able to live on main level with bedroom/bathroom Alternate Level Stairs-Number of Steps: flight Home Access: Stairs to enter Entrance Stairs-Rails: None Entrance Stairs-Number of Steps: 3 Home Care Services: No  Discharge Living Setting Plans for Discharge Living Setting: House, Lives with (comment) (Lives with son,  dtr-in-law, another son, dtr to come stay.) Type of Home at Discharge: House Discharge Home Layout: Two level, Able to live on main level with bedroom/bathroom Alternate Level Stairs-Number of Steps: 1 step up into bedroom and 1/2 bath; 1 step back down to full bath with shower. Discharge Home Access: Stairs to enter Entrance Stairs-Number of Steps: 2 step entry. Does the patient have any problems obtaining your medications?: No  Social/Family/Support Systems Patient Roles: Parent, Other (Comment) (Has a dtr, son, dtr-in-law, father.) Contact Information: Colen Darling - daughter Anticipated Caregiver: daughter and family Anticipated Caregiver's Contact Information: Hillery Hunter - daughter - [phone] Ability/Limitations of Caregiver: Daughter from [place] plans to stay with patient after discharge from rehab for a month as needed.  She is 57 yo.  Also son and dtr-in-law to assist when not working. Caregiver Availability: 24/7 Discharge Plan Discussed with Primary Caregiver: Yes Is Caregiver In Agreement with Plan?: Yes Does Caregiver/Family have Issues with Lodging/Transportation  while Pt is in Rehab?: No  Goals/Additional Needs Patient/Family Goal for Rehab: PT/OT min to mod assist, SLP  supervision goals Expected length of stay: 21-24 days Cultural Considerations: None Dietary Needs: Dys 3, nectar thick liquids Equipment Needs: TBD Pt/Family Agrees to Admission and willing to participate: Yes Program Orientation Provided & Reviewed with Pt/Caregiver Including Roles  & Responsibilities: Yes  Decrease burden of Care through IP rehab admission: N/A  Possible need for SNF placement upon discharge: Not planned  Patient Condition: This patient's medical and functional status has changed since the consult dated: 12/05/16 in which the Rehabilitation Physician determined and documented that the patient's condition is appropriate for intensive rehabilitative care in an inpatient  rehabilitation facility. See "History of Present Illness" (above) for medical update. Functional changes are: Currently requiring max assist for stand pivot transfers. Patient's medical and functional status update has been discussed with the Rehabilitation physician and patient remains appropriate for inpatient rehabilitation. Will admit to inpatient rehab tomorrow.  Preadmission Screen Completed By:  Trish Mage, 12/09/2016 11:20 AM ______________________________________________________________________   Discussed status with Dr. Riley Kill on 12/09/16 at 1024 and received telephone approval for admission tomorrow.  Admission Coordinator:  Trish Mage, time 1120/Date 12/09/16       Cosigned by: Erick Colace, MD at 12/10/2016 11:21 AM  Revision History

## 2016-12-13 ENCOUNTER — Inpatient Hospital Stay (HOSPITAL_COMMUNITY): Payer: Medicaid Other | Admitting: Speech Pathology

## 2016-12-13 ENCOUNTER — Inpatient Hospital Stay (HOSPITAL_COMMUNITY): Payer: Self-pay | Admitting: Physical Therapy

## 2016-12-13 ENCOUNTER — Inpatient Hospital Stay (HOSPITAL_COMMUNITY): Payer: Medicaid Other

## 2016-12-13 ENCOUNTER — Inpatient Hospital Stay (HOSPITAL_COMMUNITY): Payer: Self-pay | Admitting: Occupational Therapy

## 2016-12-13 LAB — GLUCOSE, CAPILLARY: Glucose-Capillary: 150 mg/dL — ABNORMAL HIGH (ref 65–99)

## 2016-12-13 MED ORDER — TOPIRAMATE 25 MG PO TABS
50.0000 mg | ORAL_TABLET | Freq: Every day | ORAL | Status: DC
  Administered 2016-12-14 – 2017-01-13 (×31): 50 mg via ORAL
  Filled 2016-12-13 (×34): qty 2

## 2016-12-13 NOTE — Progress Notes (Signed)
Social Work Assessment and Plan  Patient Details  Name: Shannon Erickson MRN: [id number] Date of Birth: 1975/12/26  Today's Date: 12/13/2016  Problem List:  Patient Active Problem List   Diagnosis Date Noted  . Stroke due to occlusion of right middle cerebral artery ([place]) 12/10/2016  . Gait disturbance, post-stroke 12/10/2016  . Hemiparesis affecting left side as late effect of cerebrovascular accident ([place])   . Left-sided visual neglect   . Antiphospholipid syndrome ([place])   . Pain   . Prediabetes   . Dysphagia, post-stroke   . Carotid atherosclerosis, bilateral   . Thyroid nodule   . Hyperlipidemia   . Chronic migraine without aura without status migrainosus, not intractable   . Headache 12/03/2016  . Hypokalemia 12/03/2016  . Stroke (cerebrum) ([place]) 12/03/2016  . Left-sided weakness    Past Medical History:  Past Medical History:  Diagnosis Date  . Lesion of left ulnar nerve   . Meningitis 1995  . Migraine    Past Surgical History:  Past Surgical History:  Procedure Laterality Date  . CESAREAN SECTION Bilateral 1995, 2002  . NO PAST SURGERIES    . TEE WITHOUT CARDIOVERSION N/A 12/05/2016   Procedure: TRANSESOPHAGEAL ECHOCARDIOGRAM (TEE);  Surgeon: Acie Fredrickson Wonda Cheng, MD;  [facility]: [facility] ENDOSCOPY;  Service: Cardiovascular;  Laterality: N/A;   Social History:  reports that she has never smoked. She has never used smokeless tobacco. She reports that she does not drink alcohol or use drugs.  Family / Support Systems Marital Status: Single Patient Roles: Parent, Other (Comment) (dtr; employee; mother-in-law; sister) Children: Simranjit Thayer - dtr from [place] - here through [place] stay - [phone]; Georjean Mode - son- lives with pt - [phone]; Jing Howatt - dtr-in-law (Jose's wife) [phone] Other Supports: Yesika Rispoli - father in [place] - [phone] Anticipated Caregiver: daughter and family Ability/Limitations of Caregiver: Daughter from  [place] plans to stay with patient after discharge from rehab for a month as needed.  She is 41 yo.  Also son and dtr-in-law to assist when not working.  Pt stated someone will be with her all the time. Caregiver Availability: 24/7 Family Dynamics: supportive family  Social History Preferred language: English Religion:  Education: [employer] for psychology Read: Yes Write: Yes Employment Status: Employed Name of Employer: Administrator, sports of Employment: 2 Return to Work Plans: Would like to return to work when she is able.  She feels her employer will have her job for her when she is ready to return. Legal History/Current Legal Issues: none reported Guardian/Conservator: MD has stated that pt is not fully capable of making her decisions.  Family can assist.   Abuse/Neglect Physical Abuse: Denies Verbal Abuse: Denies Sexual Abuse: Denies Exploitation of patient/patient's resources: Denies Self-Neglect: Denies  Emotional Status Pt's affect, behavior and adjustment status: Pt was mainly flat during our conversation, but became a little tearful as we discussed family and work and especially finances.  She admits to going through a lot over these last couple of weeks. Recent Psychosocial Issues: Pt is concerned about household finances without her income.  She has no benefits through work - no insurance or disability. Psychiatric History: none reported Substance Abuse History: none reported  Patient / Family Perceptions, Expectations & Goals Pt/Family understanding of illness & functional limitations: Pt/dtr have a good understanding of pt's condition and limitations. Premorbid pt/family roles/activities: Pt worked full time and was a part of an Research scientist (life sciences).  Her brother is a Company secretary and  got her involved in the ministry. Anticipated changes in roles/activities/participation: Pt would like to go back to her job, as she loves it, when she is able.  Pt also  wants to continue to work with [employer]. Pt/family expectations/goals: Pt wants to regain her independence.  Community Resources Express Scripts: None Premorbid Home Care/DME Agencies: None Transportation available at discharge: family Resource referrals recommended: Neuropsychology, Support group (specify) (stroke support group)  Discharge Planning Living Arrangements: Children Support Systems: Children, Armed forces technical officer, Other relatives, Friends/neighbors, Other (Comment) (employer and co-workers) Type of Residence: Private residence Administrator, sports: Government social research officer (Medicaid application already completed) Pensions consultant: Employment, Biomedical scientist (son and dtr-in-law are working) Museum/gallery curator Screen Referred: Previously completed Living Expenses: Education officer, community Management: Patient Does the patient have any problems obtaining your medications?: Yes (Describe) (Pt does not have any insurance coverage.) Home Management: Pt and her adult children were managing the home together. Patient/Family Preliminary Plans: Pt plans to return to her home with her children to be with her 24/7 to provide min A, as needed. Social Work Anticipated Follow Up Needs: HH/OP, Support Group Expected length of stay: 21 to 28 days  Clinical Impression CSW met with pt to introduce self and role of CSW, as well as to complete assessment.  CSW afterwards spoke with pt's dtr, Careina, to do the same.  She is here from [place] and plans to stay through pt's Rehab stay and to assist with transition home.  Other family to assist with 24/7 care after that.  Pt was flat and somewhat tearful as she admitted to being concerned about finances, because without her income, things will be difficult to make ends meet.  CSW explained team conference and how Rehab works and told her about some community resources that [place] would give more information about later.  Pt and dtr were both appreciative.  CSW had asked a visitor to wait outside  during [place], so let them come in as CSW exited the room.  CSW will continue to follow and assist as needed.  Clyde Zarrella, [name] 12/13/2016, 12:10 PM

## 2016-12-13 NOTE — Progress Notes (Addendum)
Physical Therapy Session Note  Patient Details  Name: Shannon Erickson MRN: [id number] Date of Birth: 04/02/1976  Today's Date: 12/13/2016 PT Individual Time: 0900-0944 PT Individual Time Calculation (min): 44 min   Short Term Goals: Week 1:  PT Short Term Goal 1 (Week 1): Pt will perform bed mobility minA PT Short Term Goal 2 (Week 1): Pt will perform transfers w/c <>bed consistent modA PT Short Term Goal 3 (Week 1): Pt will ambulate x25' maxA  PT Short Term Goal 4 (Week 1): Pt will demonstrate dynamic sitting balance with S x5 min  Skilled Therapeutic Interventions/Progress Updates:   Pt supine in bed upon arrival and agreeable to therapy, no c/o pain.   Pt transitioned supine to EOB w/ Mod A towards L side. She maintained static sitting balance w/ supervision while putting on LE clothing, she required Mod A to correct LOB at EOB while performing dynamic sitting balance to assist w/ putting on clothing.   Transferred bed to chair w/ Mod A via stand pivot transfer, verbal cues for technique and sequencing - decreased awareness of L side.   Pt performed sit to/from stand x5 w/ Min-Mod A for trunk control and to block L knee, unilateral handrail and armrest support. Verbal cues to increase trunk extension and manual/tactile cues for L foot placement. NMR while in standing to facilitate weight shift and weight acceptance on LLE. Verbal cues to attend to L side and decrease reliance on UE support.   Pt self-propelled w/c back to room w/ R UE/LE and Mod A for propulsion and L attention/awareness of environment.   Ended session in w/c, call bell within reach and all needs met.   Therapy Documentation Precautions:  Precautions Precautions: Fall Precaution Comments: L hemiplegia Restrictions Weight Bearing Restrictions: Yes  See Function Navigator for Current Functional Status.   Therapy/Group: Individual Therapy  Amy K Arnette 12/13/2016, 9:46 AM  

## 2016-12-13 NOTE — Progress Notes (Signed)
PT returned from CT scan. [facility] No acute changes large Rt MCA territory CVA  Continue to monitor neuro status/vitals per protocol. Pamelia HoitSharp, Mesiah Manzo B

## 2016-12-13 NOTE — Progress Notes (Signed)
Orthopedic Tech Progress Note Patient Details:  Shannon DiceCynthia Erickson 11/10/1975 [id number]  Ortho Devices Type of Ortho Device: Abdominal binder   Saul FordyceJennifer C Steaven Wholey 12/13/2016, 12:23 PM

## 2016-12-13 NOTE — Progress Notes (Signed)
Occupational Therapy Session Note  Patient Details  Name: Shannon Erickson MRN: [id number] Date of Birth: 06/22/1975  Today's Date: 12/13/2016 OT Individual Time: [id number] OT Individual Time Calculation (min): 53 min    Short Term Goals: Week 1:  OT Short Term Goal 1 (Week 1): Pt will sit to stand consistantly with MOD A in prep for clothing management OT Short Term Goal 2 (Week 1): Pt will maintain midline during bathing tasks while seated with supervision OT Short Term Goal 3 (Week 1): Pt will locate all needed grooming items on L of sink with MIN VC OT Short Term Goal 4 (Week 1): Pt will recall hemi techniques for UB dressing for 2 consecutive sessions  OT Short Term Goal 5 (Week 1): [facility] and [facility] PRN.  Skilled Therapeutic Interventions/Progress Updates:    Pt greeted supine in bed with daughter present and agreeable to OT. Pt reports improved status since fall this morning. Abdominal binder donned w/ rolling side-side, and TED hose already donned by nursing. Pt transferred sup<>sit with Mod A to advance LLE and Mod A to elevate trunk. Pt maintained sitting balance with close supervision, however pt reports feeling like she was swaying. This "swaying" feeling continued throughout session. Worked on dynamic sitting within LB dressing task. Overall Max A to thread pant leg, then max A to pull over hips during stand-pivot transfer to R. Educated pt on one-handed grooming modifications using L UE as a stabilizer. Pt required min cues to locate items on L side of sink but demonstrated good understanding of hemi-techniques. Pt then returned to bed with mod A. Brought pt into side-lying for gravity eliminated NMR focused on shoulder flex/ext, scap protraction/retraction, biceps flex/ext, triceps flex/ext. Pt left in sidelying with L UE supported and pillow between legs.   Therapy Documentation Precautions:  Precautions Precautions: Fall Precaution Comments: L  hemiplegia Restrictions Weight Bearing Restrictions: Yes General: General PT Missed Treatment Reason: CT/MRI Vital Signs: Therapy Vitals Temp: 97.8 F (36.6 C) Temp Source: Oral Pulse Rate: 79 Resp: 18 BP: 107/69 Patient Position (if appropriate): Lying Oxygen Therapy SpO2: 97 % O2 Device: Not Delivered  See Function Navigator for Current Functional Status.   Therapy/Group: Individual Therapy  Mal Amabilelisabeth S Natallia Stellmach 12/13/2016, 3:01 PM

## 2016-12-13 NOTE — Progress Notes (Signed)
   12/13/16 1010  Follow Up  MD notified Pam Love PAC (Pam Love PAC)  Time MD notified 1010  Family notified Yes-comment  Time family notified 1037  Additional tests Yes-comment  Simple treatment Ice (forehead, lft shoulder)  Progress note created (see row info) Yes  Adult Fall Risk Assessment  Risk Factor Category (scoring not indicated) Fall has occurred during this admission (document High fall risk)  Adult Fall Risk Interventions  Required Bundle Interventions *See Row Information* High fall risk - low, moderate, and high requirements implemented  Additional Interventions Family Supervision  Screening for Fall Injury Risk  Risk For Fall Injury- See Row Information  None identified  Vitals  BP 122/68  BP Location Right Arm  BP Method Automatic  Patient Position (if appropriate) Lying  Pulse Rate 91  Resp 20  Oxygen Therapy  SpO2 98 %  O2 Device Room Air

## 2016-12-13 NOTE — Progress Notes (Addendum)
Called to oroom found pt lying on left side on bathroom floor. NT reported she was just sitting on toilet and starting hearing noise in bathroom and noted pt had fallen off toilet and was having "seizure like activity" legs stiffened, rt arm up and shaking and red faced. Pt responded to call of name and aware people were there to help. Jerking movement lasted for minute or two and then pt assisted onto back and into a sitting position. Pt reported she was ok, had hit her head , left shoulder and, left knee and left lip on "wheelchair". Pt responsive and able to be assisted to chair and back to bed. PAm Love PAC notified of event and new orders placed. Vitals checked, see flowsheet, ice appiled to forehead, shoulder, EKG completed, CBG test completed. Pt made aware of orders and rationale for testing. Reviewed "light headness, flashes of light, cover pulled down, ringing, etc and pt denies any notice of event, just happened. Reviewed need to alert staff should she note any of the above or other changes in status than current or changes in speech, thinking, etc. States understanding of information. Pamelia HoitSharp, Shai Rasmussen B

## 2016-12-13 NOTE — Progress Notes (Signed)
Sleeping, arouses with ease. Denies pain or discomfort. Ice removed from forehead, still note bump on forehead and reapplied ice to left brachial area bruising. No other change in assessment. Pamelia HoitSharp, Crisanto Nied B

## 2016-12-13 NOTE — IPOC Note (Signed)
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)   Left-sided visual neglect   Gait disturbance, post-stroke     Functional Problem List: Nursing Bladder, Bowel  PT Balance, Endurance, Motor, Perception, Safety, Sensory, Skin Integrity  OT Balance, Cognition, Endurance, Motor, Perception, Safety, Sensory  SLP Cognition  TR         Basic ADL's: OT Grooming, Bathing, Dressing, Toileting     Advanced  ADL's: OT       Transfers: PT Bed Mobility, Bed to Chair, Car, State Street CorporationFurniture  OT       Locomotion: PT Ambulation, Psychologist, prison and probation servicesWheelchair Mobility, Stairs     Additional Impairments: OT Fuctional Use of Upper Extremity  SLP Swallowing, Communication, Social Cognition expression Social Interaction, Problem Solving, Memory, Attention, Awareness  TR      Anticipated Outcomes Item Anticipated Outcome  Self Feeding MOD I  Swallowing  Mod I   Basic self-care  S  Toileting  S toilet; MIN A shower   Bathroom Transfers S toilet; MIN A shower  Bowel/Bladder   (will be continent of urine and bowel during rehab)  Transfers  S  Locomotion  minA with LRAD  Communication  Mod I  Cognition  Supervision  Pain   (will remain comfortable while in our care)  Safety/Judgment   (will readjust decision making)   Therapy Plan: PT Intensity: Minimum of 1-2 x/day ,45 to 90 minutes PT Frequency: 5 out of 7 days PT Duration Estimated Length of Stay: 25-28 days OT Intensity: Minimum of 1-2 x/day, 45 to 90 minutes OT Frequency: 5 out of 7 days OT Duration/Estimated Length of Stay: 21-25 days SLP Intensity: Minumum of 1-2 x/day, 30 to 90 minutes SLP Frequency: 3 to 5 out of 7 days SLP Duration/Estimated Length  of Stay: 21 to 25 days       Team Interventions: Nursing Interventions Patient/Family Education, Bladder Management, Bowel Management  PT interventions Ambulation/gait training, Balance/vestibular training, Discharge planning, Disease management/prevention, Neuromuscular re-education, Functional mobility training, Psychosocial support, Skin care/wound management, Therapeutic Activities, Visual/perceptual remediation/compensation, Wheelchair propulsion/positioning, Therapeutic Exercise, UE/LE Strength taining/ROM, Splinting/orthotics, Pain management, DME/adaptive equipment instruction, Cognitive remediation/compensation, Community reintegration, Functional electrical stimulation, Patient/family education, Museum/gallery curatortair training, UE/LE Coordination activities  OT Interventions Warden/rangerBalance/vestibular training, Cognitive remediation/compensation, Discharge planning, Disease mangement/prevention, Functional electrical stimulation, Functional mobility training, Neuromuscular re-education, DME/adaptive equipment instruction, Patient/family education, Psychosocial support, Self Care/advanced ADL retraining, Splinting/orthotics, Therapeutic Activities, Therapeutic Exercise, UE/LE Strength taining/ROM, UE/LE Coordination activities, Visual/perceptual remediation/compensation  SLP Interventions Cognitive remediation/compensation, Internal/external aids, Functional tasks, Dysphagia/aspiration precaution training, Medication managment, Patient/family education, Speech/Language facilitation, Therapeutic Activities  TR Interventions    SW/CM Interventions Discharge Planning, Psychosocial Support, Patient/Family Education    Team Discharge Planning: Destination: PT-Home ,OT- Home , SLP-Home Projected Follow-up: PT-Home health PT, 24 hour supervision/assistance, OT-  Home health OT, SLP-24 hour supervision/assistance, Outpatient SLP, Home Health SLP Projected Equipment Needs: PT-To be determined, OT- 3 in 1 bedside comode, To be  determined, Tub/shower bench, SLP-None recommended by SLP Equipment Details: PT- , OT-  Patient/family involved in discharge planning: PT- Patient, Family member/caregiver,  OT-Patient, Family member/caregiver, SLP-Patient, Family member/caregiver  MD ELOS: 18-21d Medical Rehab Prognosis:  Excellent Assessment:  41 year old female with history of meningitis, migraines who was admitted on 12/03/16 with severe  right frontoparietal HA, slurred speech, lethargyand left sided weakness. CT head negative. MRI brain done revealing "Large RIGHT MCA territory nonhemorrhagic infarct involving temporal lobe, frontal lobe, insula, basal ganglia and regional white matter with early mass effect and 1-2 mm. EEG done showing right hemispheric slowing with diffuse cerebral dysfunction due to encephalopathy. BLE dopplers were negative for DVT. TEE done revealing EF 50% with no thrombus, no PFO or ASD. CTA neck showed focal irregularity at right carotid bifurcation with self like plaque and 11 mm thyroid nodule. Stroke felt to be embolic from R-ICA plaque and Dr. Roda Shutters recommended ASA/Plavix X 3 months followed by Plavix alone   Now requiring 24/7 Rehab RN,MD, as well as CIR level PT, OT and SLP.  Treatment team will focus on ADLs and mobility with goals set at Sup See Team Conference Notes for weekly updates to the plan of care

## 2016-12-13 NOTE — Progress Notes (Signed)
Subjective/Complaints: No issues overnite, more awake this am, holding breakfast for SLP eval ROS- lethargic, denies CP, SOB, N/V/D   Objective: Vital Signs: Blood pressure 111/68, pulse 79, temperature 97.9 F (36.6 C), temperature source Oral, resp. rate 16, height 5' 3"  (1.6 m), weight 89.4 kg (197 lb), SpO2 97 %. No results found. Results for orders placed or performed during the hospital encounter of 12/10/16 (from the past 72 hour(s))  Urine culture     Status: Abnormal   Collection Time: 12/10/16  4:29 PM  Result Value Ref Range   Specimen Description URINE, RANDOM    Special Requests NONE    Culture MULTIPLE SPECIES PRESENT, SUGGEST RECOLLECTION (A)    Report Status 12/11/2016 FINAL   Urinalysis, Routine w reflex microscopic     Status: Abnormal   Collection Time: 12/10/16  6:26 PM  Result Value Ref Range   Color, Urine YELLOW YELLOW   APPearance CLEAR CLEAR   Specific Gravity, Urine 1.012 1.005 - 1.030   pH 7.0 5.0 - 8.0   Glucose, UA NEGATIVE NEGATIVE mg/dL   Hgb urine dipstick LARGE (A) NEGATIVE   Bilirubin Urine NEGATIVE NEGATIVE   Ketones, ur NEGATIVE NEGATIVE mg/dL   Protein, ur NEGATIVE NEGATIVE mg/dL   Nitrite NEGATIVE NEGATIVE   Leukocytes, UA NEGATIVE NEGATIVE   RBC / HPF 6-30 0 - 5 RBC/hpf   WBC, UA 0-5 0 - 5 WBC/hpf   Bacteria, UA RARE (A) NONE SEEN   Squamous Epithelial / LPF 0-5 (A) NONE SEEN  CBC WITH DIFFERENTIAL     Status: None   Collection Time: 12/11/16  4:43 AM  Result Value Ref Range   WBC 8.6 4.0 - 10.5 K/uL   RBC 4.81 3.87 - 5.11 MIL/uL   Hemoglobin 13.3 12.0 - 15.0 g/dL   HCT 39.7 36.0 - 46.0 %   MCV 82.5 78.0 - 100.0 fL   MCH 27.7 26.0 - 34.0 pg   MCHC 33.5 30.0 - 36.0 g/dL   RDW 13.4 11.5 - 15.5 %   Platelets 272 150 - 400 K/uL   Neutrophils Relative % 56 %   Neutro Abs 4.9 1.7 - 7.7 K/uL   Lymphocytes Relative 32 %   Lymphs Abs 2.7 0.7 - 4.0 K/uL   Monocytes Relative 9 %   Monocytes Absolute 0.8 0.1 - 1.0 K/uL   Eosinophils  Relative 2 %   Eosinophils Absolute 0.2 0.0 - 0.7 K/uL   Basophils Relative 1 %   Basophils Absolute 0.0 0.0 - 0.1 K/uL  Comprehensive metabolic panel     Status: Abnormal   Collection Time: 12/11/16  4:43 AM  Result Value Ref Range   Sodium 139 135 - 145 mmol/L   Potassium 3.7 3.5 - 5.1 mmol/L   Chloride 111 101 - 111 mmol/L   CO2 20 (L) 22 - 32 mmol/L   Glucose, Bld 120 (H) 65 - 99 mg/dL   BUN 13 6 - 20 mg/dL   Creatinine, Ser 0.83 0.44 - 1.00 mg/dL   Calcium 8.5 (L) 8.9 - 10.3 mg/dL   Total Protein 6.5 6.5 - 8.1 g/dL   Albumin 2.5 (L) 3.5 - 5.0 g/dL   AST 41 15 - 41 U/L   ALT 77 (H) 14 - 54 U/L   Alkaline Phosphatase 102 38 - 126 U/L   Total Bilirubin 0.7 0.3 - 1.2 mg/dL   GFR calc non Af Amer >60 >60 mL/min   GFR calc Af Amer >60 >60 mL/min  Comment: (NOTE) The eGFR has been calculated using the CKD EPI equation. This calculation has not been validated in all clinical situations. eGFR's persistently <60 mL/min signify possible Chronic Kidney Disease.    Anion gap 8 5 - 15     HEENT: normal Cardio: RRR and no murmur Resp: CTA B/L and unlabored GI: BS positive and NT, ND Extremity:  No Edema Skin:   Intact Neuro: Lethargic, Abnormal Sensory reduced Left side, Abnormal Motor 3-/5 Left HF KE, 0 at ankle , 0 in LUE, increaseed finger flexor tone and Inattention Musc/Skel:  Normal Gen NAD   Assessment/Plan: 1. Functional deficits secondary to Right MCA infarct which require 3+ hours per day of interdisciplinary therapy in a comprehensive inpatient rehab setting. Physiatrist is providing close team supervision and 24 hour management of active medical problems listed below. Physiatrist and rehab team continue to assess barriers to discharge/monitor patient progress toward functional and medical goals. FIM: Function - Bathing Position: Shower Body parts bathed by patient: Left arm, Chest, Abdomen, Front perineal area, Right upper leg, Left upper leg Body parts bathed  by helper: Right arm, Buttocks, Right lower leg, Back, Left lower leg Assist Level: Touching or steadying assistance(Pt > 75%)  Function- Upper Body Dressing/Undressing What is the patient wearing?: Pull over shirt/dress Pull over shirt/dress - Perfomed by patient: Thread/unthread right sleeve, Thread/unthread left sleeve, Put head through opening, Pull shirt over trunk (verbal cues and demonstration for hemi dressing technique) Pull over shirt/dress - Perfomed by helper: Pull shirt over trunk, Thread/unthread left sleeve Assist Level: Supervision or verbal cues Function - Lower Body Dressing/Undressing What is the patient wearing?: Pants, Underwear, Non-skid slipper socks Position: Wheelchair/chair at sink Underwear - Performed by patient: Thread/unthread right underwear leg Underwear - Performed by helper: Thread/unthread right underwear leg, Thread/unthread left underwear leg, Pull underwear up/down Pants- Performed by patient: Thread/unthread right pants leg Pants- Performed by helper: Thread/unthread right pants leg, Thread/unthread left pants leg, Pull pants up/down, Fasten/unfasten pants Socks - Performed by patient: Don/doff right sock Socks - Performed by helper: Don/doff right sock, Don/doff left sock Assist for footwear: Partial/moderate assist Assist for lower body dressing: Touching or steadying assistance (Pt > 75%)  Function - Toileting Toileting activity did not occur: No continent bowel/bladder event Toileting steps completed by patient: Adjust clothing prior to toileting Toileting steps completed by helper: Performs perineal hygiene, Adjust clothing after toileting Toileting Assistive Devices: Grab bar or rail Assist level: Touching or steadying assistance (Pt.75%)  Function - Toilet Transfers Assist level to toilet: Maximal assist (Pt 25 - 49%/lift and lower) Assist level from toilet: Maximal assist (Pt 25 - 49%/lift and lower)  Function - Chair/bed  transfer Chair/bed transfer method: Stand pivot Chair/bed transfer assist level: Moderate assist (Pt 50 - 74%/lift or lower) Chair/bed transfer assistive device: Armrests Chair/bed transfer details: Verbal cues for sequencing, Verbal cues for technique, Manual facilitation for weight shifting, Tactile cues for placement, Tactile cues for weight shifting, Tactile cues for posture  Function - Locomotion: Wheelchair Will patient use wheelchair at discharge?:  (TBD) Function - Locomotion: Ambulation Assistive device: Rail in hallway Max distance: 25 Assist level: 2 helpers Assist level: 2 helpers Walk 50 feet with 2 turns activity did not occur: Safety/medical concerns Walk 150 feet activity did not occur: Safety/medical concerns Walk 10 feet on uneven surfaces activity did not occur: Safety/medical concerns  Function - Comprehension Comprehension: Auditory Comprehension assist level: Follows basic conversation/direction with extra time/assistive device  Function - Expression Expression: Verbal Expression assist  level: Expresses basic 90% of the time/requires cueing < 10% of the time., Expresses basic 75 - 89% of the time/requires cueing 10 - 24% of the time. Needs helper to occlude trach/needs to repeat words.  Function - Social Interaction Social Interaction assist level: Interacts appropriately 75 - 89% of the time - Needs redirection for appropriate language or to initiate interaction.  Function - Problem Solving Problem solving assist level: Solves basic 90% of the time/requires cueing < 10% of the time  Function - Memory Memory assist level: More than reasonable amount of time, Recognizes or recalls 90% of the time/requires cueing < 10% of the time Patient normally able to recall (first 3 days only): Current season, That he or she is in a hospital   Medical Problem List and Plan: 1. Left hemiparesis secondary to Right MCA infarct- ASA and Plavix CIR PT, OT, SLP team conf in  am 2. DVT Prophylaxis/Anticoagulation: Pharmaceutical: Lovenox 3. Migraine Headaches/Pain Management: has been getting Toradol since 7/14. On low dose topamax--will change to nights to help with sedation and may need titrated upwards. No current HA  4. Mood: LCSW to follow for evaluation and support.  5. Neuropsych: This patient is not fullycapable of making decisions on herown behalf. 6. Skin/Wound Care: routine pressure relief measures  7. Fluids/Electrolytes/Nutrition: Liquids advanced to thins --d/c IVF. Monitor I/O.  Lytes normal 7/22, low alb will supplement with prostat 8. Dyslipidemia: On Lipitor. 9. Thyroid nodule: follow up on outpatient basis 10. Obesity: Educate on importance of weight loss with appropriate diet to promote health and mobility.  11. Question antiphospholipid syndrome: high Beta 2-glycoprotein IgG. Will need recheck labs in 12 weeks. 12.  Spasticity L Finger flexors resting hand splint LOS (Days) 3 A FACE TO FACE EVALUATION WAS PERFORMED  Shannon Erickson E 12/13/2016, 7:31 AM

## 2016-12-13 NOTE — Progress Notes (Signed)
Physical Therapy Note  Patient Details  Name: Shannon Erickson MRN: [id number] Date of Birth: 01/17/1976 Today's Date: 12/13/2016    Per nurse pt awaiting CT scan due to fall this morning. Will follow up as able.   [name] 12/13/2016, 11:53 AM

## 2016-12-13 NOTE — Progress Notes (Signed)
Speech Language Pathology Daily Session Note  Patient Details  Name: Shannon Erickson MRN: [id number] Arlyce DiceDate of Birth: 07/28/1975  Today's Date: 12/13/2016 SLP Individual Time: [id number] SLP Individual Time Calculation (min): 45 min  Short Term Goals: Week 1: SLP Short Term Goal 1 (Week 1): Pt will consume regular diet textures without overt s/s of aspiration and Mod I use of compensatory swallow strategies.  SLP Short Term Goal 2 (Week 1): Pt will increase use speech intelligibility strategies to increase vocal intensity to achieve ~ 80% intelligibility at the sentence level. with Mod A cues. SLP Short Term Goal 3 (Week 1): Pt will use external memory aids to recall new daily information with Mod A cues.  SLP Short Term Goal 4 (Week 1): Pt will complete semi-complex problem solving tasks with Mod A cues.  SLP Short Term Goal 5 (Week 1): Pt will scan to left of her environment with Mod A cues to locate objects during functional tasks.   Skilled Therapeutic Interventions: Skilled treatment session focused on dysphagia and cognition goals. SLP facilitated session by providing skilled observation of pt consuming regular breakfast tray with thin liquids. After set-up, pt independently found all items on tray. TV left on but muted for purposeful distractions. Pt able to complete clear all boluses without overt s/s of aspiration. Diet upgraded to regular, education provided to nursing and posted at [facility]. Intermittent supervision only. Pt able to communicate wants and needs with nursing present at simple sentence level with good intelligibility at ~ 100%. Pt completed recall tasks with Mod A using schedule from previous day. Pt left upright in bed with all needs within reach. Continue per current plan of care.      Function:  Eating Eating   Modified Consistency Diet: No Eating Assist Level: Set up assist for   Eating Set Up Assist For: Opening containers;Cutting food        Cognition Comprehension Comprehension assist level: Follows basic conversation/direction with extra time/assistive device  Expression   Expression assist level: Expresses basic 90% of the time/requires cueing < 10% of the time.;Expresses basic 75 - 89% of the time/requires cueing 10 - 24% of the time. Needs helper to occlude trach/needs to repeat words.  Social Interaction Social Interaction assist level: Interacts appropriately 75 - 89% of the time - Needs redirection for appropriate language or to initiate interaction.  Problem Solving Problem solving assist level: Solves basic 90% of the time/requires cueing < 10% of the time  Memory Memory assist level: More than reasonable amount of time;Recognizes or recalls 90% of the time/requires cueing < 10% of the time    Pain    Therapy/Group: Individual Therapy  Roel Douthat 12/13/2016, 8:40 AM

## 2016-12-13 NOTE — Progress Notes (Signed)
[facility] Individual Statement of Services  Patient Name:  Shannon Erickson  Date:  12/13/2016  Welcome to the [facility].  Our goal is to provide you with an individualized program based on your diagnosis and situation, designed to meet your specific needs.  With this comprehensive rehabilitation program, you will be expected to participate in at least 3 hours of rehabilitation therapies Monday-Friday, with modified therapy programming on the weekends.  Your rehabilitation program will include the following services:  Physical Therapy (PT), Occupational Therapy (OT), Speech Therapy (ST), 24 hour per day rehabilitation nursing, Neuropsychology, Case Management (Social Worker), Rehabilitation Medicine, Nutrition Services and Pharmacy Services  Weekly team conferences will be held on Wednesdays to discuss your progress.  Your Social Worker will talk with you frequently to get your input and to update you on team discussions.  Team conferences with you and your family in attendance may also be held.  Expected length of stay: 21 to 28 days  Overall anticipated outcome: Supervision to minimal assistance  Depending on your progress and recovery, your program may change. Your Social Worker will coordinate services and will keep you informed of any changes. Your Social Worker's name and contact numbers are listed  below.  The following services may also be recommended but are not provided by the [facility]:   Driving Evaluations  Home Health Rehabiltiation Services  Outpatient Rehabilitation Services  Vocational Rehabilitation   Arrangements will be made to provide these services after discharge if needed.  Arrangements include referral to agencies that provide these services.  Your insurance has been verified to be:  You've applied for Medicaid. Your primary doctor is:  I [facility] get [facility] [place].  I have given you registration packet to complete.  Pertinent information will be shared with your doctor and your insurance company.  Social Worker:  Staci AcostaJenny Rudean Icenhour, LCSW  [phone] or [phone]) [phone]  Information discussed with and copy given to patient by: Elvera LennoxPrevatt, Jerica Creegan Capps, 12/13/2016, 11:45 AM

## 2016-12-14 ENCOUNTER — Inpatient Hospital Stay (HOSPITAL_COMMUNITY): Payer: Self-pay | Admitting: Occupational Therapy

## 2016-12-14 ENCOUNTER — Inpatient Hospital Stay (HOSPITAL_COMMUNITY): Payer: Self-pay | Admitting: Physical Therapy

## 2016-12-14 ENCOUNTER — Inpatient Hospital Stay (HOSPITAL_COMMUNITY): Payer: Self-pay

## 2016-12-14 LAB — BASIC METABOLIC PANEL
Anion gap: 7 (ref 5–15)
BUN: 13 mg/dL (ref 6–20)
CHLORIDE: 109 mmol/L (ref 101–111)
CO2: 23 mmol/L (ref 22–32)
CREATININE: 0.81 mg/dL (ref 0.44–1.00)
Calcium: 8.6 mg/dL — ABNORMAL LOW (ref 8.9–10.3)
GFR calc non Af Amer: >60 mL/min (ref >60)
Glucose, Bld: 118 mg/dL — ABNORMAL HIGH (ref 65–99)
POTASSIUM: 3.9 mmol/L (ref 3.5–5.1)
SODIUM: 139 mmol/L (ref 135–145)

## 2016-12-14 LAB — CBC
HEMATOCRIT: 42.2 % (ref 36.0–46.0)
Hemoglobin: 13.7 g/dL (ref 12.0–15.0)
MCH: 27.5 pg (ref 26.0–34.0)
MCHC: 32.5 g/dL (ref 30.0–36.0)
MCV: 84.6 fL (ref 78.0–100.0)
Platelets: 294 10*3/uL (ref 150–400)
RBC: 4.99 MIL/uL (ref 3.87–5.11)
RDW: 14 % (ref 11.5–15.5)
WBC: 8.2 10*3/uL (ref 4.0–10.5)

## 2016-12-14 NOTE — Progress Notes (Signed)
Physical Therapy Session Note  Patient Details  Name: Shannon Erickson MRN: [id number] Date of Birth: 03/30/1976  Today's Date: 12/14/2016 PT Individual Time: [id number] PT Individual Time Calculation (min): 43 min   Short Term Goals: Week 1:  PT Short Term Goal 1 (Week 1): Pt will perform bed mobility minA PT Short Term Goal 2 (Week 1): Pt will perform transfers w/c <>bed consistent modA PT Short Term Goal 3 (Week 1): Pt will ambulate x25' maxA  PT Short Term Goal 4 (Week 1): Pt will demonstrate dynamic sitting balance with S x5 min  Skilled Therapeutic Interventions/Progress Updates:    Pt sitting in w/c upon arrival, agreeable to PT session. Pt propelling w/c X100 ft with supervision, cues needed to attend to objects on Lt side. Pt with tendency to drift Lt and needing cues for correction. Sit<>stand performed with mod assist from w/c, working on standing posture and weight shifting Lt (PT blocking Lt knee). Progressing to ambulation using hall rail, 10 ft X2 with total assist to advance LLE and PT blocking knee with stance phase. Following session, pt returned to room, up in w/c with all needs in reach.   Therapy Documentation Precautions:  Precautions Precautions: Fall Precaution Comments: L hemiplegia Restrictions Weight Bearing Restrictions: Yes Pain: Pain Assessment Pain Assessment: No/denies pain  See Function Navigator for Current Functional Status.   Therapy/Group: Individual Therapy  Delton SeeBenjamin Kendahl Bumgardner, PT 12/14/2016, 5:02 PM

## 2016-12-14 NOTE — Plan of Care (Signed)
Problem: RH SAFETY Goal: RH STG DECREASED RISK OF FALL WITH ASSISTANCE STG Decreased Risk of Fall With min Assistance.   Outcome: Not Progressing Pt fell yesterday in her bathroom

## 2016-12-14 NOTE — Progress Notes (Signed)
Physical Therapy Session Note  Patient Details  Name: Shannon Erickson MRN: [id number] Date of Birth: 02/21/1976  Today's Date: 12/14/2016 PT Individual Time: 1300-1400 PT Individual Time Calculation (min): 60 min   Short Term Goals: Week 1:  PT Short Term Goal 1 (Week 1): Pt will perform bed mobility minA PT Short Term Goal 2 (Week 1): Pt will perform transfers w/c <>bed consistent modA PT Short Term Goal 3 (Week 1): Pt will ambulate x25' maxA  PT Short Term Goal 4 (Week 1): Pt will demonstrate dynamic sitting balance with S x5 min  Skilled Therapeutic Interventions/Progress Updates:    no c/o pain. PT treatment session focused on sit to stand transfers with equal LE weight bearing, NMR of the L LE in standing for knee and hip extension with weight shifting to L LE.  Pt supine in bed with daughter, daughter-in-law and nurse tech present, pt agreeable to PT treatment session. Pt supine to sit using bed rails with mod assist for forward weight shift and verbal cues for sequencing. Pt stand pivot transfer bed to w/c to mat with mod assist for lifting and pivoting with verbal cues for sequencing. PT rolled pt to/from therapy gym for energy conservation and time. Pt sit to stand from mat with mod assist for lifting and balance with verbal cues for forward weight shift. Pt standing without UE support focusing on midline orientation with mirror for feedback and PT providing tactile cues for L LE knee extension and weight shifting. Pt moved to same task at the rail with R UE support for balance with noticed increased weight shifting to L LE and increased attention to L LE knee extension.   Pt performed the following with mirror feedback at rail for R UE support:  L LE weight shifting facilitation by R LE on small step x4 to fatigue with mod assist for standing balance with max multimodal cues for upright posture, L LE knee extension and weight shifting R LE step up (2in step) x2 to fatigue with PT  blocking L knee with tactile cues for L knee extension and verbal cues for upright posture   Pt returned to room in w/c with QRB on and call bell in reach.   Therapy Documentation Precautions:  Precautions Precautions: Fall Precaution Comments: L hemiplegia Restrictions Weight Bearing Restrictions: Yes   See Function Navigator for Current Functional Status.   Therapy/Group: Individual Therapy  Rafay Dahan 12/14/2016, 4:45 PM

## 2016-12-14 NOTE — Progress Notes (Signed)
Subjective/Complaints: Discussed fall- pt light headed on toilet, nsg was outside of door.Discussed CT result   No HA or arm pain  ROS- lethargic, denies CP, SOB, N/V/D   Objective: Vital Signs: Blood pressure 124/68, pulse 96, temperature 98.4 F (36.9 C), temperature source Oral, resp. rate 16, height _0  (1.6 m), weight 89.5 kg (197 lb 4.2 oz), SpO2 99 %. Ct Head Wo Contrast  Result Date: 12/13/2016 CLINICAL DATA:  Fall this morning. EXAM: CT HEAD WITHOUT CONTRAST TECHNIQUE: Contiguous axial images were obtained from the base of the skull through the vertex without intravenous contrast. COMPARISON:  12/08/2016 FINDINGS: Brain: Large right MCA territory infarct again noted throughout the right posterior frontal, temporal, parietal lobes and right basal ganglia, unchanged no hemorrhage. No hydrocephalus. No new infarction. Vascular: No hyperdense vessel or unexpected calcification. Skull: No acute calvarial abnormality. Sinuses/Orbits: Visualized paranasal sinuses and mastoids clear. Orbital soft tissues unremarkable. Other: None IMPRESSION: Stable large right MCA territory infarct. Electronically Signed   By: Rolm Baptise M.D.   On: 12/13/2016 11:39   Results for orders placed or performed during the hospital encounter of 12/10/16 (from the past 72 hour(s))  Glucose, capillary     Status: Abnormal   Collection Time: 12/13/16 10:08 AM  Result Value Ref Range   Glucose-Capillary 150 (H) 65 - 99 mg/dL  Basic metabolic panel     Status: Abnormal   Collection Time: 12/14/16  5:26 AM  Result Value Ref Range   Sodium 139 135 - 145 mmol/L   Potassium 3.9 3.5 - 5.1 mmol/L   Chloride 109 101 - 111 mmol/L   CO2 23 22 - 32 mmol/L   Glucose, Bld 118 (H) 65 - 99 mg/dL   BUN 13 6 - 20 mg/dL   Creatinine, Ser 0.81 0.44 - 1.00 mg/dL   Calcium 8.6 (L) 8.9 - 10.3 mg/dL   GFR calc non Af Amer >60 >60 mL/min   GFR calc Af Amer >60 >60 mL/min    Comment: (NOTE) The eGFR has been calculated using the  CKD EPI equation. This calculation has not been validated in all clinical situations. eGFR's persistently <60 mL/min signify possible Chronic Kidney Disease.    Anion gap 7 5 - 15  CBC     Status: None   Collection Time: 12/14/16  5:26 AM  Result Value Ref Range   WBC 8.2 4.0 - 10.5 K/uL   RBC 4.99 3.87 - 5.11 MIL/uL   Hemoglobin 13.7 12.0 - 15.0 g/dL   HCT 42.2 36.0 - 46.0 %   MCV 84.6 78.0 - 100.0 fL   MCH 27.5 26.0 - 34.0 pg   MCHC 32.5 30.0 - 36.0 g/dL   RDW 14.0 11.5 - 15.5 %   Platelets 294 150 - 400 K/uL     HEENT: normal Cardio: RRR and no murmur Resp: CTA B/L and unlabored GI: BS positive and NT, ND Extremity:  No Edema Skin:   Intact Neuro: Lethargic, Abnormal Sensory reduced Left side, Abnormal Motor 3-/5 Left HF KE, 0 at ankle , 0 in LUE, increaseed finger flexor tone and Inattention Musc/Skel:  Normal Gen NAD   Assessment/Plan: 1. Functional deficits secondary to Right MCA infarct which require 3+ hours per day of interdisciplinary therapy in a comprehensive inpatient rehab setting. Physiatrist is providing close team supervision and 24 hour management of active medical problems listed below. Physiatrist and rehab team continue to assess barriers to discharge/monitor patient progress toward functional and medical goals. FIM: Function -  Bathing Position: Shower Body parts bathed by patient: Left arm, Chest, Abdomen, Front perineal area, Right upper leg, Left upper leg Body parts bathed by helper: Right arm, Buttocks, Right lower leg, Back, Left lower leg Assist Level: Touching or steadying assistance(Pt > 75%)  Function- Upper Body Dressing/Undressing What is the patient wearing?: Pull over shirt/dress Pull over shirt/dress - Perfomed by patient: Thread/unthread right sleeve, Thread/unthread left sleeve, Put head through opening, Pull shirt over trunk (verbal cues and demonstration for hemi dressing technique) Pull over shirt/dress - Perfomed by helper: Pull  shirt over trunk, Thread/unthread left sleeve Assist Level: Supervision or verbal cues Function - Lower Body Dressing/Undressing What is the patient wearing?: Pants, Shoes Position: Sitting EOB Underwear - Performed by patient: Thread/unthread right underwear leg Underwear - Performed by helper: Thread/unthread right underwear leg, Thread/unthread left underwear leg, Pull underwear up/down Pants- Performed by patient: Thread/unthread right pants leg Pants- Performed by helper: Thread/unthread right pants leg, Thread/unthread left pants leg, Pull pants up/down, Fasten/unfasten pants Socks - Performed by patient: Don/doff right sock Socks - Performed by helper: Don/doff right sock, Don/doff left sock Shoes - Performed by patient: Don/doff right shoe Shoes - Performed by helper: Don/doff left shoe Assist for footwear: Partial/moderate assist Assist for lower body dressing: Touching or steadying assistance (Pt > 75%)  Function - Toileting Toileting activity did not occur: No continent bowel/bladder event Toileting steps completed by patient: Adjust clothing prior to toileting Toileting steps completed by helper: Performs perineal hygiene, Adjust clothing after toileting Toileting Assistive Devices: Grab bar or rail Assist level: Touching or steadying assistance (Pt.75%)  Function - Toilet Transfers Assist level to toilet: Maximal assist (Pt 25 - 49%/lift and lower) Assist level from toilet: Maximal assist (Pt 25 - 49%/lift and lower)  Function - Chair/bed transfer Chair/bed transfer method: Stand pivot Chair/bed transfer assist level: Moderate assist (Pt 50 - 74%/lift or lower) Chair/bed transfer assistive device: Armrests, Walker Chair/bed transfer details: Verbal cues for sequencing, Verbal cues for technique  Function - Locomotion: Wheelchair Will patient use wheelchair at discharge?:  (TBD) Max wheelchair distance: 100' Assist Level: Moderate assistance (Pt 50 - 74%) Assist  Level: Moderate assistance (Pt 50 - 74%) Turns around,maneuvers to table,bed, and toilet,negotiates 3% grade,maneuvers on rugs and over doorsills: No Function - Locomotion: Ambulation Assistive device: Rail in hallway Max distance: 25 Assist level: 2 helpers Assist level: 2 helpers Walk 50 feet with 2 turns activity did not occur: Safety/medical concerns Walk 150 feet activity did not occur: Safety/medical concerns Walk 10 feet on uneven surfaces activity did not occur: Safety/medical concerns  Function - Comprehension Comprehension: Auditory Comprehension assist level: Follows basic conversation/direction with extra time/assistive device  Function - Expression Expression: Verbal Expression assist level: Expresses basic 90% of the time/requires cueing < 10% of the time., Expresses basic 75 - 89% of the time/requires cueing 10 - 24% of the time. Needs helper to occlude trach/needs to repeat words.  Function - Social Interaction Social Interaction assist level: Interacts appropriately 75 - 89% of the time - Needs redirection for appropriate language or to initiate interaction.  Function - Problem Solving Problem solving assist level: Solves basic 90% of the time/requires cueing < 10% of the time  Function - Memory Memory assist level: More than reasonable amount of time, Recognizes or recalls 90% of the time/requires cueing < 10% of the time Patient normally able to recall (first 3 days only): Current season, That he or she is in a hospital, Staff names and faces  Medical Problem List and Plan: 1. Left hemiparesis secondary to Right MCA infarct- ASA and Plavix CIR PT, OT, SLP Team conference today please see physician documentation under team conference tab, met with team face-to-face to discuss problems,progress, and goals. Formulized individual treatment plan based on medical history, underlying problem and comorbidities. 2. DVT Prophylaxis/Anticoagulation: Pharmaceutical:  Lovenox Plt 294K 3. Migraine Headaches/Pain Management: has been getting Toradol since 7/14. On low dose topamax--will change to nights to help with sedation and may need titrated upwards. No current HA  4. Mood: LCSW to follow for evaluation and support.  5. Neuropsych: This patient is not fullycapable of making decisions on herown behalf. 6. Skin/Wound Care: routine pressure relief measures  7. Fluids/Electrolytes/Nutrition: Liquids advanced to thins --d/c IVF. Monitor I/O.  Lytes normal 7/25, low alb will supplement with prostat 8. Dyslipidemia: On Lipitor. 9. Thyroid nodule: follow up on outpatient basis 10. Obesity: Educate on importance of weight loss with appropriate diet to promote health and mobility.  11. Question antiphospholipid syndrome: high Beta 2-glycoprotein IgG. Will need recheck labs in 12 weeks. 12.  Spasticity L Finger flexors resting hand splint LOS (Days) 4 A FACE TO FACE EVALUATION WAS PERFORMED  Jonaven Hilgers E 12/14/2016, 7:48 AM

## 2016-12-14 NOTE — Progress Notes (Signed)
Speech Language Pathology Daily Session Note  Patient Details  Name: Shannon Erickson MRN: [id number] Date of Birth: 11/06/1975  Today's Date: 12/14/2016 SLP Individual Time: 1130-1200 SLP Individual Time Calculation (min): 30 min  Short Term Goals: Week 1: SLP Short Term Goal 1 (Week 1): Pt will consume regular diet textures without overt s/s of aspiration and Mod I use of compensatory swallow strategies.  SLP Short Term Goal 2 (Week 1): Pt will increase use speech intelligibility strategies to increase vocal intensity to achieve ~ 80% intelligibility at the sentence level. with Mod A cues. SLP Short Term Goal 3 (Week 1): Pt will use external memory aids to recall new daily information with Mod A cues.  SLP Short Term Goal 4 (Week 1): Pt will complete semi-complex problem solving tasks with Mod A cues.  SLP Short Term Goal 5 (Week 1): Pt will scan to left of her environment with Mod A cues to locate objects during functional tasks.   Skilled Therapeutic Interventions: Skilled ST services focused on cognitive goals. SLP facilitated semi-complex to complex problem solving tasks utilizng ALFA daily math problems with Min-Mod A question cues. Pt demonstrated ability to recall deficits and hospitalization history. Family members presents and daughter recalled intelligibility and process delay strategies. Pt was left in chair with call bell within reach. Recommend to continue to ST services at this time.       Function:  Cognition Comprehension Comprehension assist level: Follows basic conversation/direction with extra time/assistive device  Expression   Expression assist level: Expresses complex 90% of the time/cues < 10% of the time  Social Interaction Social Interaction assist level: Interacts appropriately 90% of the time - Needs monitoring or encouragement for participation or interaction.  Problem Solving Problem solving assist level: Solves basic 75 - 89% of the time/requires cueing 10 -  24% of the time  Memory Memory assist level: Recognizes or recalls 90% of the time/requires cueing < 10% of the time    Pain Pain Assessment Pain Assessment: No/denies pain  Therapy/Group: Individual Therapy  Bryley Kovacevic  [facility] 12/14/2016, 3:09 PM

## 2016-12-14 NOTE — Progress Notes (Addendum)
Occupational Therapy Session Note  Patient Details  Name: Shannon Erickson MRN: [id number] Date of Birth: 04/17/1976  Today's Date: 12/14/2016  Session 1 OT Individual Time: 0800-0900 OT Individual Time Calculation (min): 60 min   Session 2 OT Individual Time: 1430-1458 OT Individual Time Calculation (min): 28 min   Short Term Goals: Week 1:  OT Short Term Goal 1 (Week 1): Pt will sit to stand consistantly with MOD A in prep for clothing management OT Short Term Goal 2 (Week 1): Pt will maintain midline during bathing tasks while seated with supervision OT Short Term Goal 3 (Week 1): Pt will locate all needed grooming items on L of sink with MIN VC OT Short Term Goal 4 (Week 1): Pt will recall hemi techniques for UB dressing for 2 consecutive sessions  OT Short Term Goal 5 (Week 1): Pt will transfer to [facility] with MOD A and LRAD PRN.  Skilled Therapeutic Interventions/Progress Updates:  Session 1  Pt transferred OOB with assistance to advance LLE and mod A to elevate trunk/ Pt achieved sitting balance, then completed stand-pivot to wc to on R w. Mod A. Stand-pivot in and out of shower using grab bars with mod A. Shower level bathing completed with intermittent min A for sitting balance when leaning to wash behind. Incorporated L IUE NMR weight bearing techniques during bathing tasks. Educated pt on hemi-dressing techniques with pt completing with overall mod A. Pt needed cues to recall how to use L UE as stabilizer while applying toothoaste to toothbrush and set-up A. Pt left seated in wc at end of session with call bell and needs met.   Session 2 OT treatment session focused on L NMR and NMES. Pt brought into gravity eliminated side-lying position for L NMR. Provided joint input at wrist and elbow to bring pt through full ROM. NMES seated EOB focused on wrist extensors. Pt tolerated 5 mins at EOB, then reported fatigue sitting and returned to side-lying for last 5 mins. Pt left side-lying in  bed with needs met.    Therapy Documentation Precautions:  Precautions Precautions: Fall Precaution Comments: L hemiplegia Restrictions Weight Bearing Restrictions: Yes Pain: Pain Assessment Pain Assessment: No/denies painnone/denies pain  See Function Navigator for Current Functional Status.   Therapy/Group: Individual Therapy  Valma Cava 12/14/2016, 2:58 PM

## 2016-12-14 NOTE — Progress Notes (Addendum)
Occupational Therapy Session Note  Patient Details  Name: Shannon Erickson MRN: [id number] Date of Birth: 01/16/1976  Today's Date: 12/14/2016 OT Individual Time:1018-1033 and  [id number] OT Individual Time Calculation (min):15 min and 16 min (31 total min)   Short Term Goals: Week 1:  OT Short Term Goal 1 (Week 1): Pt will sit to stand consistantly with MOD A in prep for clothing management OT Short Term Goal 2 (Week 1): Pt will maintain midline during bathing tasks while seated with supervision OT Short Term Goal 3 (Week 1): Pt will locate all needed grooming items on L of sink with MIN VC OT Short Term Goal 4 (Week 1): Pt will recall hemi techniques for UB dressing for 2 consecutive sessions  OT Short Term Goal 5 (Week 1): [facility] A and LRAD PRN.  Skilled Therapeutic Interventions/Progress Updates:    Session 1 Pt received supine in bed agreeable to OT tx session with focus on LUE NMR. With Pt in supine completed PROM LUE including shoulder flex/ext, shoulder horizontal abd/adduction, elbow flex/ext and scapular protraction/retraction with Pt able to provide minimal AAROM during scapular retraction. Daughter inquiring about assisting Pt with PROM while Pt not in therapy with therapist agreeing to review self-ROM and PROM techniques with Pt and daughter. Pt left supine in bed, needs within reach.   Session 2 with focus on additional LUE NMR and sitting balance. Pt completed supine to sitting EOB with modA, maintains static sitting balance with single UE support and no UE support with minA. Educated Pt and Pt's daughter on PROM/self-ROM techniques with education provided on body mechanics and verbal cues for Pt to complete slow, controlled movements during completion. Pt demonstrates shoulder horizontal abd/adduction, elbow flex/extension, forearm supination/pronation, completing 10 reps each. Handout provided with corresponding movements with plan to add additional  movements in future sessions. Pt/Pt's daughter verbalizing understanding of education.  Pt returned to supine in bed with ModA for LE management, +2 for [facility] to [facility]. Pt left supine in bed, lunch tray setup, call bell and needs within reach.   Therapy Documentation Precautions:  Precautions Precautions: Fall Precaution Comments: L hemiplegia Restrictions Weight Bearing Restrictions: Yes   Pain: Pain Assessment Pain Assessment: No/denies pain Pain Score: 0-No pain  See Function Navigator for Current Functional Status.   Therapy/Group: Individual Therapy  Orlando PennerBreanna L Ollin Hochmuth 12/14/2016, 2:18 PM

## 2016-12-15 ENCOUNTER — Inpatient Hospital Stay (HOSPITAL_COMMUNITY): Payer: Self-pay | Admitting: Physical Therapy

## 2016-12-15 ENCOUNTER — Inpatient Hospital Stay (HOSPITAL_COMMUNITY): Payer: Medicaid Other | Admitting: Occupational Therapy

## 2016-12-15 ENCOUNTER — Inpatient Hospital Stay (HOSPITAL_COMMUNITY): Payer: Medicaid Other | Admitting: Speech Pathology

## 2016-12-15 LAB — ANTINUCLEAR ANTIBODIES, IFA

## 2016-12-15 MED ORDER — TIZANIDINE HCL 2 MG PO TABS
4.0000 mg | ORAL_TABLET | Freq: Every day | ORAL | Status: DC
  Administered 2016-12-15 – 2017-01-05 (×22): 4 mg via ORAL
  Filled 2016-12-15 (×25): qty 2

## 2016-12-15 NOTE — Progress Notes (Signed)
Speech Language Pathology Daily Session Note  Patient Details  Name: Shannon Erickson MRN: [id number] Date of Birth: 08/27/1975  Today's Date: 12/15/2016 SLP Individual Time: 1450-1520 SLP Individual Time Calculation (min): 30 min  Short Term Goals: Week 1: SLP Short Term Goal 1 (Week 1): Pt will consume regular diet textures without overt s/s of aspiration and Mod I use of compensatory swallow strategies.  SLP Short Term Goal 2 (Week 1): Pt will increase use speech intelligibility strategies to increase vocal intensity to achieve ~ 80% intelligibility at the sentence level. with Mod A cues. SLP Short Term Goal 3 (Week 1): Pt will use external memory aids to recall new daily information with Mod A cues.  SLP Short Term Goal 4 (Week 1): Pt will complete semi-complex problem solving tasks with Mod A cues.  SLP Short Term Goal 5 (Week 1): Pt will scan to left of her environment with Mod A cues to locate objects during functional tasks.   Skilled Therapeutic Interventions: Skilled treatment session focused on cognition goals. SLP facilitated session by providing supervision cues to locate items to left of environment Pt able to use external memory aids to recall information from today with supervision cues. Pt left upright in wheelchair, safety belt donned and all needs within reach. Continue per current plan of care.      Function:   Cognition Comprehension Comprehension assist level: Understands complex 90% of the time/cues 10% of the time  Expression   Expression assist level: Expresses complex ideas: With extra time/assistive device  Social Interaction Social Interaction assist level: Interacts appropriately with others with medication or extra time (anti-anxiety, antidepressant).  Problem Solving Problem solving assist level: Solves basic 90% of the time/requires cueing < 10% of the time  Memory Memory assist level: Recognizes or recalls 90% of the time/requires cueing < 10% of the time     Pain    Therapy/Group: Individual Therapy  Yahya Boldman 12/15/2016, 3:40 PM

## 2016-12-15 NOTE — Progress Notes (Signed)
Physical Therapy Session Note  Patient Details  Name: Shannon Erickson MRN: [id number] Date of Birth: 07/25/1975  Today's Date: 12/15/2016 PT Individual Time: 1000-1100 PT Individual Time Calculation (min): 60 min   Short Term Goals: Week 1:  PT Short Term Goal 1 (Week 1): Pt will perform bed mobility minA PT Short Term Goal 2 (Week 1): Pt will perform transfers w/c <>bed consistent modA PT Short Term Goal 3 (Week 1): Pt will ambulate x25' maxA  PT Short Term Goal 4 (Week 1): Pt will demonstrate dynamic sitting balance with S x5 min  Skilled Therapeutic Interventions/Progress Updates:    no c/o pain. PT treatment session focused on midline orientation, L LE NMR during tall kneeling and exercises, L UE weight bearing and proprioceptive input.  Pt supine in bed upon arrival, agreeable to PT treatment session. Pt donned socks in supine with mod-max assist for LE placement and clothing management. Pt supine to sit using bed rails with mod assist for forward weight shifting. Pt performed LB dressing sitting EOB with mod-max assist for sitting balance and LE placement. Pt standing to complete LB dressing with 2 attempts and mod assist for balance and min assist with clothing management. Pt stand pivot transfer bed to w/c with mod assist for lifting and pivoting. PT pushed pt to/from gym in w/c for energy conservation and time. Pt got into tall kneeling on mat with +2 assist for balance and L LE placement onto mat. Pt in tall kneeling with B UE support on bench x3 for 3 minutes each with breaks between with supervision-mod assist to maintain tall kneeling position based on fatigue. PT providing manual facilitation with tactile and verbal cuing for hip extension and for L UE weight bearing to fix R lateral lean and come to midline. Pt sit to supine with mod assist and verbal cues for sequencing. Pt performed supine bridging with adductor ball squeeze x12. PT positioned LEs to increase use of L LE and  provided tactile cues for L hip extension. Pt R roll to sit with increased time and min assist for balance with verbal cues for technique and sequencing. Pt sit to stand x6 mod assist for balance with manual facilitation and tactile cues for L weight shift. PT lowered w/c to allow pt to perform B LE w/c propulsion in future sessions. Pt returned to room in w/c by PT due to time. Pt stand pivot transfer w/c to bed with mod assist for balance and pivoting. Pt sit to L side lying with min assist for trunk control. Pt left side lying on L to increase proprioceptive input and attention to L side with call bell in reach.   Therapy Documentation Precautions:  Precautions Precautions: Fall Precaution Comments: L hemiplegia Restrictions Weight Bearing Restrictions: Yes   See Function Navigator for Current Functional Status.   Therapy/Group: Individual Therapy  Jahsiah Carpenter 12/15/2016, 12:06 PM

## 2016-12-15 NOTE — Progress Notes (Signed)
Physical Therapy Session Note  Patient Details  Name: Shannon DiceCynthia Deneault MRN: [id number] Date of Birth: 01/09/1976  Today's Date: 12/15/2016 PT Individual Time: 1545-1630 PT Individual Time Calculation (min): 45 min   Short Term Goals: Week 1:  PT Short Term Goal 1 (Week 1): Pt will perform bed mobility minA PT Short Term Goal 2 (Week 1): Pt will perform transfers w/c <>bed consistent modA PT Short Term Goal 3 (Week 1): Pt will ambulate x25' maxA  PT Short Term Goal 4 (Week 1): Pt will demonstrate dynamic sitting balance with S x5 min  Skilled Therapeutic Interventions/Progress Updates:    no c/o pain. PT treatment session focused on NMR of L LE during standing and NMR of L UE during weight bearing.  Pt supine in bed upon arrival, agreeable to PT treatment session. Pt supine to sit using bed rails with mod assist for weight shifting. Pt squat pivot transfer bed to w/c with mod assist for pivoting and verbal cues for head-hips relationship and hand placement. PT rolled pt to gym for energy conservation and time. Pt squat pivot w/c to mat with mod assist for pivoting. Pt sit to stand with mod assist for trunk control and balance with a focus on L weight shift for increased L LE weight bearing throughout session. Pt standing with platform walker and mirror feedback with mod assist for balance x 4 to fatigue. PT provided max multimodal cues for L weight shifting, increased L LE weight bearing, L knee extension, trunk control, and midline orientation. Pt performed seated L lateral trunk lean with L UE weight bearing through elbow with mod assist for trunk and UE positioning x 4 while pt completing PEG board task with R UE. Pt squat pivot transfer to w/c with mod assist for pivoting. Pt performed [id number] w/c mobility using L UE and LE with supervision for safety. Pt left sitting in w/c with call bell in reach and QRB on.    Therapy Documentation Precautions:  Precautions Precautions: Fall Precaution  Comments: L hemiplegia Restrictions Weight Bearing Restrictions: Yes   See Function Navigator for Current Functional Status.   Therapy/Group: Individual Therapy  Oluwaseyi Raffel 12/15/2016, 6:03 PM

## 2016-12-15 NOTE — Progress Notes (Signed)
Subjective/Complaints:  ROS- lethargic, denies CP, SOB, N/V/D   Objective: Vital Signs: Blood pressure 107/68, pulse (!) 101, temperature 98.7 F (37.1 C), temperature source Oral, resp. rate 18, height 5' 3" (1.6 m), weight 84.9 kg (187 lb 2 oz), SpO2 100 %. Ct Head Wo Contrast  Result Date: 12/13/2016 CLINICAL DATA:  Fall this morning. EXAM: CT HEAD WITHOUT CONTRAST TECHNIQUE: Contiguous axial images were obtained from the base of the skull through the vertex without intravenous contrast. COMPARISON:  12/08/2016 FINDINGS: Brain: Large right MCA territory infarct again noted throughout the right posterior frontal, temporal, parietal lobes and right basal ganglia, unchanged no hemorrhage. No hydrocephalus. No new infarction. Vascular: No hyperdense vessel or unexpected calcification. Skull: No acute calvarial abnormality. Sinuses/Orbits: Visualized paranasal sinuses and mastoids clear. Orbital soft tissues unremarkable. Other: None IMPRESSION: Stable large right MCA territory infarct. Electronically Signed   By: Rolm Baptise M.D.   On: 12/13/2016 11:39   Results for orders placed or performed during the hospital encounter of 12/10/16 (from the past 72 hour(s))  Glucose, capillary     Status: Abnormal   Collection Time: 12/13/16 10:08 AM  Result Value Ref Range   Glucose-Capillary 150 (H) 65 - 99 mg/dL  Basic metabolic panel     Status: Abnormal   Collection Time: 12/14/16  5:26 AM  Result Value Ref Range   Sodium 139 135 - 145 mmol/L   Potassium 3.9 3.5 - 5.1 mmol/L   Chloride 109 101 - 111 mmol/L   CO2 23 22 - 32 mmol/L   Glucose, Bld 118 (H) 65 - 99 mg/dL   BUN 13 6 - 20 mg/dL   Creatinine, Ser 0.81 0.44 - 1.00 mg/dL   Calcium 8.6 (L) 8.9 - 10.3 mg/dL   GFR calc non Af Amer >60 >60 mL/min   GFR calc Af Amer >60 >60 mL/min    Comment: (NOTE) The eGFR has been calculated using the CKD EPI equation. This calculation has not been validated in all clinical situations. eGFR's  persistently <60 mL/min signify possible Chronic Kidney Disease.    Anion gap 7 5 - 15  CBC     Status: None   Collection Time: 12/14/16  5:26 AM  Result Value Ref Range   WBC 8.2 4.0 - 10.5 K/uL   RBC 4.99 3.87 - 5.11 MIL/uL   Hemoglobin 13.7 12.0 - 15.0 g/dL   HCT 42.2 36.0 - 46.0 %   MCV 84.6 78.0 - 100.0 fL   MCH 27.5 26.0 - 34.0 pg   MCHC 32.5 30.0 - 36.0 g/dL   RDW 14.0 11.5 - 15.5 %   Platelets 294 150 - 400 K/uL     HEENT: normal Cardio: RRR and no murmur Resp: CTA B/L and unlabored GI: BS positive and NT, ND Extremity:  No Edema Skin:   Intact Neuro: Lethargic, Abnormal Sensory reduced Left side, Abnormal Motor 3-/5 Left HF KE, 0 at ankle , 0 in LUE, increaseed finger flexor tone and Inattention Musc/Skel:  Normal Gen NAD   Assessment/Plan: 1. Functional deficits secondary to Right MCA infarct which require 3+ hours per day of interdisciplinary therapy in a comprehensive inpatient rehab setting. Physiatrist is providing close team supervision and 24 hour management of active medical problems listed below. Physiatrist and rehab team continue to assess barriers to discharge/monitor patient progress toward functional and medical goals. FIM: Function - Bathing Position: Shower Body parts bathed by patient: Left arm, Chest, Abdomen, Front perineal area, Right upper leg, Left upper  leg Body parts bathed by helper: Right arm, Buttocks, Right lower leg, Back, Left lower leg Assist Level: Touching or steadying assistance(Pt > 75%)  Function- Upper Body Dressing/Undressing What is the patient wearing?: Pull over shirt/dress Pull over shirt/dress - Perfomed by patient: Thread/unthread right sleeve, Thread/unthread left sleeve, Put head through opening, Pull shirt over trunk (verbal cues and demonstration for hemi dressing technique) Pull over shirt/dress - Perfomed by helper: Pull shirt over trunk, Thread/unthread left sleeve Assist Level: Supervision or verbal  cues Function - Lower Body Dressing/Undressing What is the patient wearing?: Pants, Shoes Position: Sitting EOB Underwear - Performed by patient: Thread/unthread right underwear leg Underwear - Performed by helper: Thread/unthread right underwear leg, Thread/unthread left underwear leg, Pull underwear up/down Pants- Performed by patient: Thread/unthread right pants leg Pants- Performed by helper: Thread/unthread right pants leg, Thread/unthread left pants leg, Pull pants up/down, Fasten/unfasten pants Socks - Performed by patient: Don/doff right sock Socks - Performed by helper: Don/doff right sock, Don/doff left sock Shoes - Performed by patient: Don/doff right shoe Shoes - Performed by helper: Don/doff left shoe Assist for footwear: Partial/moderate assist Assist for lower body dressing: Touching or steadying assistance (Pt > 75%)  Function - Toileting Toileting activity did not occur: No continent bowel/bladder event Toileting steps completed by patient: Adjust clothing prior to toileting Toileting steps completed by helper: Performs perineal hygiene, Adjust clothing after toileting Toileting Assistive Devices: Grab bar or rail Assist level: Touching or steadying assistance (Pt.75%)  Function - Toilet Transfers Assist level to toilet: Maximal assist (Pt 25 - 49%/lift and lower) Assist level from toilet: Maximal assist (Pt 25 - 49%/lift and lower)  Function - Chair/bed transfer Chair/bed transfer method: Stand pivot Chair/bed transfer assist level: Moderate assist (Pt 50 - 74%/lift or lower) Chair/bed transfer assistive device: Armrests Chair/bed transfer details: Verbal cues for technique, Verbal cues for sequencing, Manual facilitation for weight shifting, Tactile cues for weight shifting, Tactile cues for sequencing, Tactile cues for posture, Tactile cues for placement  Function - Locomotion: Wheelchair Will patient use wheelchair at discharge?:  (TBD) Type: Manual Max  wheelchair distance: 100 Assist Level: Supervision or verbal cues Assist Level: Supervision or verbal cues Turns around,maneuvers to table,bed, and toilet,negotiates 3% grade,maneuvers on rugs and over doorsills: No Function - Locomotion: Ambulation Assistive device: Rail in hallway Max distance: 10 ft Assist level: Maximal assist (Pt 25 - 49%) Assist level: Maximal assist (Pt 25 - 49%) Walk 50 feet with 2 turns activity did not occur: Safety/medical concerns Walk 150 feet activity did not occur: Safety/medical concerns Walk 10 feet on uneven surfaces activity did not occur: Safety/medical concerns  Function - Comprehension Comprehension: Auditory Comprehension assist level: Follows basic conversation/direction with extra time/assistive device  Function - Expression Expression: Verbal Expression assist level: Expresses complex 90% of the time/cues < 10% of the time  Function - Social Interaction Social Interaction assist level: Interacts appropriately 90% of the time - Needs monitoring or encouragement for participation or interaction.  Function - Problem Solving Problem solving assist level: Solves basic 75 - 89% of the time/requires cueing 10 - 24% of the time  Function - Memory Memory assist level: Recognizes or recalls 90% of the time/requires cueing < 10% of the time Patient normally able to recall (first 3 days only): Current season, That he or she is in a hospital, Staff names and faces   Medical Problem List and Plan: 1. Left hemiparesis secondary to Right MCA infarct- ASA and Plavix Cont CIR PT, OT, SLP  anticipate >3wk LOS although may progress more quickly due to age, discussed with pt and daughter 2. DVT Prophylaxis/Anticoagulation: Pharmaceutical: Lovenox Plt 294K on 7/25 3. Migraine Headaches/Pain Management: has been getting Toradol since 7/14. On low dose topamax--will change to nights to help with sedation and may need titrated upwards. No current HA  4. Mood:  LCSW to follow for evaluation and support.  5. Neuropsych: This patient is not fullycapable of making decisions on herown behalf. 6. Skin/Wound Care: routine pressure relief measures  7. Fluids/Electrolytes/Nutrition: Liquids advanced to thins --intake 75-100% meals . Monitor I/O.  Lytes normal 7/25, low alb will supplement with prostat 8. Dyslipidemia: On Lipitor. 9. Thyroid nodule: follow up on outpatient basis 10. Obesity: Educate on importance of weight loss with appropriate diet to promote health and mobility.  11. Question antiphospholipid syndrome: high Beta 2-glycoprotein IgG. Will need recheck labs in 12 weeks. 12.  Spasticity L Finger flexors resting hand splint on Robaxin, will change to tizanidine LOS (Days) 5 A FACE TO FACE EVALUATION WAS PERFORMED  KIRSTEINS,ANDREW E 12/15/2016, 7:00 AM

## 2016-12-15 NOTE — Progress Notes (Signed)
Occupational Therapy Session Note  Patient Details  Name: Shannon Erickson MRN: [id number] Date of Birth: 03/25/1976  Today's Date: 12/15/2016 OT Individual Time: 1400-1500 OT Individual Time Calculation (min): 60 min    Short Term Goals: Week 1:  OT Short Term Goal 1 (Week 1): Pt will sit to stand consistantly with MOD A in prep for clothing management OT Short Term Goal 2 (Week 1): Pt will maintain midline during bathing tasks while seated with supervision OT Short Term Goal 3 (Week 1): Pt will locate all needed grooming items on L of sink with MIN VC OT Short Term Goal 4 (Week 1): Pt will recall hemi techniques for UB dressing for 2 consecutive sessions  OT Short Term Goal 5 (Week 1): [facility].  Skilled Therapeutic Interventions/Progress Updates:    Treatment session with focus on standing balance, Lt attention, and LUE NMR.  Pt received in bed reporting fatigue from AM sessions but willing to engage in treatment session.  Completed bed mobility and squat pivot transfer to Rt with min assist.  Engaged in WB through LUE in sitting while addressing trunk control and attention to Lt.  Utilized Ship brokermirror for visual feedback for sitting balance and attention to Lt.  Engaged in trail making task on mirror with focus on visual scanning and Lt attention, incorporated hand over hand when erasing mirror.  Min-mod assist sit > stand with pt requiring mod assist due to trunk ataxia and LLE weakness.  Blocking at Lt knee and facilitation at hips and trunk for upright standing posture during reaching task.  Pt required mod verbal cues for sequencing and card identification as pt attempting to match red with black cards despite cue to match by suit.  Returned to room and left upright with lunch tray and all needs in reach.  Therapy Documentation Precautions:  Precautions Precautions: Fall Precaution Comments: L hemiplegia Restrictions Weight Bearing Restrictions:  Yes General:   Vital Signs: Therapy Vitals Temp: 98 F (36.7 C) Temp Source: Oral Pulse Rate: 81 Resp: 16 BP: 110/63 Patient Position (if appropriate): Lying Oxygen Therapy SpO2: 99 % O2 Device: Not Delivered Pain:  Pt with no c/o pain  See Function Navigator for Current Functional Status.   Therapy/Group: Individual Therapy  Rosalio LoudHOXIE, [name] 12/15/2016, 3:21 PM

## 2016-12-16 ENCOUNTER — Inpatient Hospital Stay (HOSPITAL_COMMUNITY): Payer: Self-pay | Admitting: Physical Therapy

## 2016-12-16 ENCOUNTER — Inpatient Hospital Stay (HOSPITAL_COMMUNITY): Payer: Medicaid Other | Admitting: Speech Pathology

## 2016-12-16 ENCOUNTER — Inpatient Hospital Stay (HOSPITAL_COMMUNITY): Payer: Self-pay | Admitting: Occupational Therapy

## 2016-12-16 DIAGNOSIS — K5901 Slow transit constipation: Secondary | ICD-10-CM

## 2016-12-16 MED ORDER — SENNOSIDES-DOCUSATE SODIUM 8.6-50 MG PO TABS
2.0000 | ORAL_TABLET | Freq: Two times a day (BID) | ORAL | Status: DC
  Administered 2016-12-16 – 2016-12-19 (×2): 2 via ORAL
  Filled 2016-12-16 (×5): qty 2

## 2016-12-16 NOTE — Progress Notes (Signed)
Physical Therapy Session Note  Patient Details  Name: Shannon DiceCynthia Wonnacott MRN: [id number] Date of Birth: 05/15/1976  Today's Date: 12/16/2016 PT Individual Time: 1324-40100945-1030 PT Individual Time Calculation (min): 45 min   Short Term Goals: Week 1:  PT Short Term Goal 1 (Week 1): Pt will perform bed mobility minA PT Short Term Goal 2 (Week 1): Pt will perform transfers w/c <>bed consistent modA PT Short Term Goal 3 (Week 1): Pt will ambulate x25' maxA  PT Short Term Goal 4 (Week 1): Pt will demonstrate dynamic sitting balance with S x5 min  Skilled Therapeutic Interventions/Progress Updates:    no c/o pain. PT treatment session focused on L LE NMR during standing activities, L UE weight bearing for proprioceptive input, and midline orientation.   Pt sitting in w/c with daughter present upon arrival, pt agreeable to PT treatment session. PT rolled pt in w/c to gym for energy conservation and time. Pt sit to stand throughout session from w/c or mat with min-mod assist for lifting, balance and trunk control based on pt fatigue. Pt got into tall kneeling on mat with +2 assist for safety , lifting L LE on mat, balance and trunk control. Pt in tall kneeling with B UE support on bench 4 minutes with supervision - mod assist to maintain upright posture with verbal and tactile cues for hip extension, L trunk lean, midline orientation, and L UE weight bearing. Pt returned to w/c stepping backwards off mat with +2 assist for safety and L LE assist. Pt squat pivot transfer w/c to mat with mod assist for lifting and pivoting and verbal cues for technique, head-hip relationship, and step-up of L LE placement and w/c arm removal. Pt standing with platform walker and mirror feedback with focus on midline orientation and L LE NMR with min assist, with occasional mod assist, for standing balance and trunk control. PT provided tactile and verbal cues for L LE knee extension, upright posture and L weight shift. Pt attempted  R LE step up on 1 inch stair with platform walker and +2 assist for balance, L knee extension with pt unable to step R LE off step. Pt performed R LE heel lifts for L weight shift x15 with mirror feedback and max assist for balance, L knee extension and trunk control. PT provided max multimodal cues for L weight shifting, upright posture, and L knee extension. Pt squat pivot mat to w/c with min assist for hip placement at end of transfer. PT rolled pt to day room for time and handoff to ST.  Therapy Documentation Precautions:  Precautions Precautions: Fall Precaution Comments: L hemiplegia Restrictions Weight Bearing Restrictions: Yes   See Function Navigator for Current Functional Status.   Therapy/Group: Individual Therapy  Deva Ron 12/16/2016, 11:17 AM

## 2016-12-16 NOTE — Progress Notes (Signed)
Occupational Therapy Session Note  Patient Details  Name: Shannon Erickson MRN: [id number] Date of Birth: [birth year]  Today's Date: 12/16/2016 OT Individual Time: 0801-0900 OT Individual Time Calculation (min): 59 min    Short Term Goals: Week 1:  OT Short Term Goal 1 (Week 1): Pt will sit to stand consistantly with MOD A in prep for clothing management OT Short Term Goal 2 (Week 1): Pt will maintain midline during bathing tasks while seated with supervision OT Short Term Goal 3 (Week 1): Pt will locate all needed grooming items on L of sink with MIN VC OT Short Term Goal 4 (Week 1): Pt will recall hemi techniques for UB dressing for 2 consecutive sessions  OT Short Term Goal 5 (Week 1): Pt will transfer to [facility] with MOD A and LRAD PRN.  Skilled Therapeutic Interventions/Progress Updates:    Pt asleep in bed upon OT arrival, easy to awake and agreeable to OT. Pt completed UB and LB dressing seated EOB. Pt required trunk support to safely acces LB to don R sock. Pt unable to tolerate figure 4 position with LLE in order to don L sock or thread L pant leg. [employer] forced use of L UE with hand-over hand A to reach to pull up pants. Sit<>stand w/ Mod A, then assist to pull pants over hips. Pt with good recall of hemi-techniques for UB dressing, but was unable to orient shirt requiring set-up A and assistance to pull shirt over back. Pt then reported need for bathroom. Pt completed stand-pivot to wc, then to toilet with mod A to stand and mod to pivot R, max to pivot L. Pt with successful BM, worked on hip hike for toileting strategies with pt needing mod A to maintain balance when reaching behind. Pt then completed grooming at the sink with min set-up A. Pt left seated in wc with breakfast set-up, daughter present, and needs met.   Therapy Documentation Precautions:  Precautions Precautions: Fall Precaution Comments: L hemiplegia Restrictions Weight Bearing Restrictions: Yes Pain: Pain  Assessment Pain Assessment: No/denies pain Pain Score: 0-No pain  See Function Navigator for Current Functional Status.   Therapy/Group: Individual Therapy  Valma Cava 12/16/2016, 9:03 AM

## 2016-12-16 NOTE — Patient Care Conference (Signed)
Inpatient RehabilitationTeam Conference and Plan of Care Update Date: 12/14/2016   Time: 11:10 AM    Patient Name: Shannon Erickson      Medical Record Number: [id number]  Date of Birth: 07/28/1975 Sex: Female         Room/Bed: 4M04C/4M04C-01 Payor Info: Payor: MEDICAID POTENTIAL / Plan: MEDICAID POTENTIAL / Product Type: *No Product type* /    Admitting Diagnosis: Right CVA  Admit Date/Time:  12/10/2016  4:39 PM Admission Comments: No comment available   Primary Diagnosis:  Hemiparesis affecting left side as late effect of cerebrovascular accident [facility](HCC) Principal Problem: Hemiparesis affecting left side as late effect of cerebrovascular accident [facility](HCC)  Patient Active Problem List   Diagnosis Date Noted  . 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 Gait disturbance, post-stroke 12/10/2016  . Hemiparesis affecting left side as late effect of cerebrovascular accident (HCC)   . Left-sided visual neglect   . Antiphospholipid syndrome (HCC)   . Pain   . Prediabetes   . Dysphagia, post-stroke   . Carotid atherosclerosis, bilateral   . Thyroid nodule   . Hyperlipidemia   . Chronic migraine without aura without status migrainosus, not intractable   . Headache 12/03/2016  . Hypokalemia 12/03/2016  . 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 Left-sided weakness     Expected Discharge Date: Expected Discharge Date:  (4 weeks estimate)  Team Members Present: Physician leading conference: Dr. Claudette LawsAndrew Kirsteins Social Worker Present: Staci AcostaJenny Joory Gough, LCSW Nurse Present: Other (comment) Shannon Hillock(Laurie D., Shannon Erickson) Shannon Erickson Present: Shannon Erickson, Shannon Erickson Shannon Erickson Present: Shannon Erickson, Shannon Erickson Shannon Erickson Present: Shannon Erickson, Shannon Erickson PPS Coordinator present : Shannon DuckMarie Noel, Shannon Erickson, Shannon Erickson     Current Status/Progress Goal Weekly Team Focus  Medical   Fall due to micturition syncope, CT of the head showed no acute injury, mild arm bruising but no arm injury.  Reduce fall risk, increase safety awareness,  may need to keep blood pressure higher  Monitor for decline in mental status, improve continence may need bladder program   Bowel/Bladder   continent of B&b  Remain continent       Swallow/Nutrition/ Hydration   Intermittent supervision with regular and thin liquids  Mod I  use of compensatory swallow strategies, upgraded to regular   ADL's   Mod A overall  Min A/supervision   L NMR, transfer training, balance strategies, L side attention, modified bathing/dressing    Mobility   mod A bed mobility, sit to stand, w/c mobility, and transfers; +2 ambulation; max A stairs  supervision bed mobility and transfers; min A ambulation with LRAD and stairs; supervision w/c mobility  sitting and standing balance, activity tolerance, pre-gait, attention to left side   Communication   Min A for ~ > 90% intelligibility at the sentence level  Supervision at the simple conversation level  introducation and carry over of speech intelligibility strategies.   Safety/Cognition/ Behavioral Observations  s/p fall on 12/13/16 , VSS,bruising to forehead and upper left chest area abrasion to left knee  no reports of fall or injuries,   Semi-complex problem solving, left inattention, anticipatory awareness   Pain   no c/o pain , muscle spasma         Skin              Rehab Goals Patient on target to meet rehab goals: Yes Rehab Goals Revised: none - Shannon Erickson's first conference *See Care Plan and progress notes for long and short-term goals.     Barriers  to Discharge  Current Status/Progress Possible Resolutions Date Resolved   Physician    Incontinence;Medical stability;Other (comments)  Financial concerns, patient was main breadwinner  Initiating therapy program  Continue CIR, given age, will likely have a better outcome      Nursing                  Shannon Erickson  Home environment access/layout;Other (comments)  stairs to enter home              Shannon Erickson                  Shannon Erickson                SW                Discharge  Planning/Teaching Needs:  Shannon Erickson plans to return to her home where her family will take turns being with her for assistance.  Shannon Erickson's family will be offered family education closer to d/c.   Team Discussion:  Shannon Erickson with recent syncopal episode.  CT completed and no new issues.  Shannon Erickson is flat, but overall doing well.  She has showered with Shannon Erickson, has some impulsivity, and poor safety awareness.  Shannon Erickson is mod to min A with complex speech/cognition tasks.  Shannon Erickson also has left inattention   Shannon Erickson is sometimes continent with bladder and continent with bowel.  Nursing to work on this.  Revisions to Treatment Plan:  None - Shannon Erickson's first conference    Continued Need for Acute Rehabilitation Level of Care: The patient requires daily medical management by a physician with specialized training in physical medicine and rehabilitation for the following conditions: Daily direction of a multidisciplinary physical rehabilitation program to ensure safe treatment while eliciting the highest outcome that is of practical value to the patient.: Yes Daily medical management of patient stability for increased activity during participation in an intensive rehabilitation regime.: Yes Daily analysis of laboratory values and/or radiology reports with any subsequent need for medication adjustment of medical intervention for : Neurological problems;Blood pressure problems;Urological problems  Shannon Erickson, Shannon Erickson 12/16/2016, 1:03 AM

## 2016-12-16 NOTE — Progress Notes (Signed)
Subjective/Complaints: On tizanidine for spasms last noc, some improvement  + nasal congestion no cough or sweats Poor appetite , reports no BM  ROS- lethargic, denies CP, SOB, N/V/D   Objective: Vital Signs: Blood pressure 115/63, pulse 97, temperature 98.2 F (36.8 C), temperature source Oral, resp. rate 16, height 5' 3"  (1.6 m), weight 84.9 kg (187 lb 2 oz), SpO2 98 %. No results found. Results for orders placed or performed during the hospital encounter of 12/10/16 (from the past 72 hour(s))  Glucose, capillary     Status: Abnormal   Collection Time: 12/13/16 10:08 AM  Result Value Ref Range   Glucose-Capillary 150 (H) 65 - 99 mg/dL  Basic metabolic panel     Status: Abnormal   Collection Time: 12/14/16  5:26 AM  Result Value Ref Range   Sodium 139 135 - 145 mmol/L   Potassium 3.9 3.5 - 5.1 mmol/L   Chloride 109 101 - 111 mmol/L   CO2 23 22 - 32 mmol/L   Glucose, Bld 118 (H) 65 - 99 mg/dL   BUN 13 6 - 20 mg/dL   Creatinine, Ser 0.81 0.44 - 1.00 mg/dL   Calcium 8.6 (L) 8.9 - 10.3 mg/dL   GFR calc non Af Amer >60 >60 mL/min   GFR calc Af Amer >60 >60 mL/min    Comment: (NOTE) The eGFR has been calculated using the CKD EPI equation. This calculation has not been validated in all clinical situations. eGFR's persistently <60 mL/min signify possible Chronic Kidney Disease.    Anion gap 7 5 - 15  CBC     Status: None   Collection Time: 12/14/16  5:26 AM  Result Value Ref Range   WBC 8.2 4.0 - 10.5 K/uL   RBC 4.99 3.87 - 5.11 MIL/uL   Hemoglobin 13.7 12.0 - 15.0 g/dL   HCT 42.2 36.0 - 46.0 %   MCV 84.6 78.0 - 100.0 fL   MCH 27.5 26.0 - 34.0 pg   MCHC 32.5 30.0 - 36.0 g/dL   RDW 14.0 11.5 - 15.5 %   Platelets 294 150 - 400 K/uL     HEENT: normal Cardio: RRR and no murmur Resp: CTA B/L and unlabored GI: BS positive and NT, ND Extremity:  No Edema Skin:   Intact Neuro: Lethargic, Abnormal Sensory reduced Left side, Abnormal Motor 3-/5 Left HF KE, 0 at ankle , 0  in LUE, increaseed finger flexor tone and Inattention Musc/Skel:  Normal Gen NAD   Assessment/Plan: 1. Functional deficits secondary to Right MCA infarct which require 3+ hours per day of interdisciplinary therapy in a comprehensive inpatient rehab setting. Physiatrist is providing close team supervision and 24 hour management of active medical problems listed below. Physiatrist and rehab team continue to assess barriers to discharge/monitor patient progress toward functional and medical goals. FIM: Function - Bathing Position: Shower Body parts bathed by patient: Left arm, Chest, Abdomen, Front perineal area, Right upper leg, Left upper leg Body parts bathed by helper: Right arm, Buttocks, Right lower leg, Back, Left lower leg Assist Level: Touching or steadying assistance(Pt > 75%)  Function- Upper Body Dressing/Undressing What is the patient wearing?: Pull over shirt/dress Pull over shirt/dress - Perfomed by patient: Thread/unthread right sleeve, Thread/unthread left sleeve, Put head through opening, Pull shirt over trunk (verbal cues and demonstration for hemi dressing technique) Pull over shirt/dress - Perfomed by helper: Pull shirt over trunk, Thread/unthread left sleeve Assist Level: Supervision or verbal cues Function - Lower Body Dressing/Undressing What is  the patient wearing?: Pants, Shoes Position: Sitting EOB Underwear - Performed by patient: Thread/unthread right underwear leg Underwear - Performed by helper: Thread/unthread right underwear leg, Thread/unthread left underwear leg, Pull underwear up/down Pants- Performed by patient: Thread/unthread right pants leg Pants- Performed by helper: Thread/unthread right pants leg, Thread/unthread left pants leg, Pull pants up/down, Fasten/unfasten pants Socks - Performed by patient: Don/doff right sock Socks - Performed by helper: Don/doff right sock, Don/doff left sock Shoes - Performed by patient: Don/doff right shoe Shoes -  Performed by helper: Don/doff left shoe Assist for footwear: Partial/moderate assist Assist for lower body dressing: Touching or steadying assistance (Pt > 75%)  Function - Toileting Toileting activity did not occur: No continent bowel/bladder event Toileting steps completed by patient: Adjust clothing prior to toileting, Adjust clothing after toileting Toileting steps completed by helper: Performs perineal hygiene Toileting Assistive Devices: Grab bar or rail Assist level: Touching or steadying assistance (Pt.75%)  Function - Toilet Transfers Assist level to toilet: Maximal assist (Pt 25 - 49%/lift and lower) Assist level from toilet: Maximal assist (Pt 25 - 49%/lift and lower)  Function - Chair/bed transfer Chair/bed transfer method: Squat pivot Chair/bed transfer assist level: Moderate assist (Pt 50 - 74%/lift or lower) Chair/bed transfer assistive device: Armrests Chair/bed transfer details: Tactile cues for weight shifting, Manual facilitation for weight shifting, Verbal cues for technique, Verbal cues for sequencing  Function - Locomotion: Wheelchair Will patient use wheelchair at discharge?:  (TBD) Type:  (R UE and LE) Max wheelchair distance: 174f Assist Level: Supervision or verbal cues Assist Level: Supervision or verbal cues Assist Level: Supervision or verbal cues Turns around,maneuvers to table,bed, and toilet,negotiates 3% grade,maneuvers on rugs and over doorsills: No Function - Locomotion: Ambulation Assistive device: Rail in hallway Max distance: 10 ft Assist level: Maximal assist (Pt 25 - 49%) Assist level: Maximal assist (Pt 25 - 49%) Walk 50 feet with 2 turns activity did not occur: Safety/medical concerns Walk 150 feet activity did not occur: Safety/medical concerns Walk 10 feet on uneven surfaces activity did not occur: Safety/medical concerns  Function - Comprehension Comprehension: Auditory Comprehension assist level: Understands complex 90% of the  time/cues 10% of the time  Function - Expression Expression: Verbal Expression assist level: Expresses complex ideas: With extra time/assistive device  Function - Social Interaction Social Interaction assist level: Interacts appropriately with others with medication or extra time (anti-anxiety, antidepressant).  Function - Problem Solving Problem solving assist level: Solves basic 90% of the time/requires cueing < 10% of the time  Function - Memory Memory assist level: Recognizes or recalls 90% of the time/requires cueing < 10% of the time Patient normally able to recall (first 3 days only): Current season, That he or she is in a hospital, Staff names and faces, Location of own room   Medical Problem List and Plan: 1. Left hemiparesis secondary to Right MCA infarct- ASA and Plavix Cont CIR PT, OT, SLP  2. DVT Prophylaxis/Anticoagulation: Pharmaceutical: Lovenox Plt 294K on 7/25 3. Migraine Headaches/Pain Management: has been getting Toradol since 7/14. On low dose topamax--will change to nights to help with sedation and may need titrated upwards. No current HA  4. Mood: LCSW to follow for evaluation and support.  5. Neuropsych: This patient is not fullycapable of making decisions on herown behalf. 6. Skin/Wound Care: routine pressure relief measures  7. Fluids/Electrolytes/Nutrition: Liquids advanced to thins --intake 75-100% meals although pt states appetite poor. Monitor I/O.  Lytes normal 7/25, low alb will supplement with prostat 8. Dyslipidemia:  On Lipitor. 9. Thyroid nodule: follow up on outpatient basis 10. Obesity: Educate on importance of weight loss with appropriate diet to promote health and mobility.  11. Question antiphospholipid syndrome: high Beta 2-glycoprotein IgG. Will need recheck labs in 12 weeks. 12.  Spasticity L Finger flexors resting hand splint Cont tizanidine 22m qhs 13.  Constipation start senna 2 po BID, needs supp today as well 14.  Rhinitis start  claritin LOS (Days) 6 A FACE TO FACE EVALUATION WAS PERFORMED  Shannon Erickson E 12/16/2016, 7:09 AM

## 2016-12-16 NOTE — Progress Notes (Signed)
Social Work Patient ID: Laruth Bouchard, female   DOB: 09-02-1975, 41 y.o.   MRN: [id number]   CSW met with pt and her dtr, Careina, and dtr-in-law, [name] 12-14-16 to update them on team conference discussion.  Explained that team did not feel ready to set a date for pt due to them just beginning to work with pt.  Expected LOS is 4 weeks and CSW shared this with pt and family.  Pt will need some min assist at home, but MD feels pt being young may help pt with better, faster recovery.  CSW gave [name] her husband's completed FMLA paperwork.  No other concerns/questions/needs at this time.  Pt/family appreciative of care pt is receiving on CIR.

## 2016-12-16 NOTE — Progress Notes (Signed)
Speech Language Pathology Daily Session Note  Patient Details  Name: Shannon Erickson MRN: [id number] Date of Birth: [birth year]  Today's Date: 12/16/2016 SLP Individual Time: 1030-1130 SLP Individual Time Calculation (min): 60 min  Short Term Goals: Week 1: SLP Short Term Goal 1 (Week 1): Pt will consume regular diet textures without overt s/s of aspiration and Mod I use of compensatory swallow strategies.  SLP Short Term Goal 1 - Progress (Week 1): Met SLP Short Term Goal 2 (Week 1): Pt will increase use speech intelligibility strategies to increase vocal intensity to achieve ~ 80% intelligibility at the sentence level. with Mod A cues. SLP Short Term Goal 2 - Progress (Week 1): Met SLP Short Term Goal 3 (Week 1): Pt will use external memory aids to recall new daily information with Mod A cues.  SLP Short Term Goal 4 (Week 1): Pt will complete semi-complex problem solving tasks with Mod A cues.  SLP Short Term Goal 5 (Week 1): Pt will scan to left of her environment with Mod A cues to locate objects during functional tasks.   Skilled Therapeutic Interventions: Skilled treatment session focused on cognition goals. SLP facilitated session by providing Mod A for completion of complex medication management task. Pt with good self-monitoring and self-correction during task. Pt able to scan to left with Min A cues. Pt with great progress towards speech intelligibility goals and is intelligible at the conversation level with Mod I. Pt returned to room, left upright in wheelchair with daughter present.      Function:  Eating Eating                 Cognition Comprehension Comprehension assist level: Follows complex conversation/direction with extra time/assistive device  Expression   Expression assist level: Expresses complex ideas: With extra time/assistive device  Social Interaction Social Interaction assist level: Interacts appropriately with others with medication or extra time  (anti-anxiety, antidepressant).  Problem Solving Problem solving assist level: Solves basic problems with no assist  Memory Memory assist level: Recognizes or recalls 90% of the time/requires cueing < 10% of the time    Pain Pain Assessment Pain Assessment: No/denies pain Pain Score: 0-No pain  Therapy/Group: Individual Therapy  Isami Mehra 12/16/2016, 10:47 AM

## 2016-12-17 ENCOUNTER — Inpatient Hospital Stay (HOSPITAL_COMMUNITY): Payer: Self-pay | Admitting: *Deleted

## 2016-12-17 DIAGNOSIS — K5901 Slow transit constipation: Secondary | ICD-10-CM | POA: Diagnosis present

## 2016-12-17 DIAGNOSIS — J00 Acute nasopharyngitis [common cold]: Secondary | ICD-10-CM

## 2016-12-17 LAB — GLUCOSE, CAPILLARY: GLUCOSE-CAPILLARY: 122 mg/dL — AB (ref 65–99)

## 2016-12-17 NOTE — Progress Notes (Signed)
Physical Therapy Session Note  Patient Details  Name: Shannon Erickson MRN: [id number] Date of Birth: December 16, 1975  Today's Date: 12/16/2016 PT Individual Time: 1600-1700   60 min   Short Term Goals: Week 1:  PT Short Term Goal 1 (Week 1): Pt will perform bed mobility minA PT Short Term Goal 2 (Week 1): Pt will perform transfers w/c <>bed consistent modA PT Short Term Goal 3 (Week 1): Pt will ambulate x25' maxA  PT Short Term Goal 4 (Week 1): Pt will demonstrate dynamic sitting balance with S x5 min  Skilled Therapeutic Interventions/Progress Updates:   Pt received supine in bed and agreeable to PT. Supine>sit transfer with mod assist from PT and  Moderate cues for sequencing. Tone in RLE limiting independence with transfer.   PT transported pt to hall at rehab gym.  Gait training instructed by PT with max assist +2 for WC follow 2x 67f. PT required to advance the LLE and prevent adduction tone in the LLE as well as prevent knee instability on first bout. Improved limb advancement and knee control on second bout,but still required assist to prevent adduction from tone.  WC mobility training instructed by PT x 1525fon various surfaces with min assist using R hemi technique. Min cues for threshold management and improved turing technique to the R.   Squat piivot transfers completed x 6 throughout treatment with min assist from PT and mod cues for proper LE and UE set up.   Sit<>supine from mat mat table with min assist to the R and min cues for improved sequencing for LLE and trunk.   Supine NMR: all completed x 10 BLE. Bridge, SLR, SAQ, heel slide, hip abduction. PNF D1 to improve LLE movement out of Synergistic pattern. PT provided AAROM as needed throughout Nueromotor re-education.   Pt returned to room and performed squat pivot transfer to bed with min assist. Sit>supine completed with mod assist to the L. Pt left supine in bed with call bell in reach and all needs met.         Therapy Documentation Precautions:  Precautions Precautions: Fall Precaution Comments: L hemiplegia Restrictions Weight Bearing Restrictions: Yes Vital Signs: Therapy Vitals Temp: 98.2 F (36.8 C) Temp Source: Oral Pulse Rate: 79 Resp: 18 BP: 97/67 Patient Position (if appropriate): Lying Oxygen Therapy SpO2: 96 % O2 Device: Not Delivered Pain: 0 /10     See Function Navigator for Current Functional Status.   Therapy/Group: Individual Therapy  AuLorie Phenix/28/2018, 5:20 AM

## 2016-12-17 NOTE — Progress Notes (Signed)
Subjective/Complaints: Pt seen laying in bed this AM.  She slept well overnight.  Family with questions about blood thinners.   ROS- Denies CP, SOB, N/V/D   Objective: Vital Signs: Blood pressure 97/67, pulse 79, temperature 98.2 F (36.8 C), temperature source Oral, resp. rate 18, height 5\' 3"  (1.6 m), weight 84.9 kg (187 lb 2 oz), SpO2 96 %. No results found. No results found for this or any previous visit (from the past 72 hour(s)).   HEENT: Normocephalic, atraumatic Cardio: RRR and no JVD Resp: CTA B/L and unlabored GI: BS positive and ND Skin:   Intact. Warm and dry Neuro: Lethargic,  Motor4-/5 Left HF KE, 0 at ankle  2-/5 in LUE Musc/Skel:  No edema, no tenderness Gen NAD. Vital signs reivewed  Assessment/Plan: 1. Functional deficits secondary to Right MCA infarct which require 3+ hours per day of interdisciplinary therapy in a comprehensive inpatient rehab setting. Physiatrist is providing close team supervision and 24 hour management of active medical problems listed below. Physiatrist and rehab team continue to assess barriers to discharge/monitor patient progress toward functional and medical goals. FIM: Function - Bathing Position: Shower Body parts bathed by patient: Left arm, Chest, Abdomen, Front perineal area, Right upper leg, Left upper leg Body parts bathed by helper: Right arm, Buttocks, Right lower leg, Back, Left lower leg Assist Level: Touching or steadying assistance(Pt > 75%)  Function- Upper Body Dressing/Undressing What is the patient wearing?: Pull over shirt/dress Pull over shirt/dress - Perfomed by patient: Thread/unthread right sleeve, Put head through opening Pull over shirt/dress - Perfomed by helper: Thread/unthread left sleeve, Pull shirt over trunk Assist Level: Touching or steadying assistance(Pt > 75%) Function - Lower Body Dressing/Undressing What is the patient wearing?: Pants, Non-skid slipper socks Position: Sitting EOB Underwear -  Performed by patient: Thread/unthread right underwear leg Underwear - Performed by helper: Thread/unthread right underwear leg, Thread/unthread left underwear leg, Pull underwear up/down Pants- Performed by patient: Thread/unthread right pants leg Pants- Performed by helper: Thread/unthread left pants leg, Pull pants up/down Non-skid slipper socks- Performed by helper: Don/doff right sock, Don/doff left sock Socks - Performed by patient: Don/doff right sock Socks - Performed by helper: Don/doff right sock, Don/doff left sock Shoes - Performed by patient: Don/doff right shoe Shoes - Performed by helper: Don/doff left shoe Assist for footwear: Partial/moderate assist Assist for lower body dressing: Touching or steadying assistance (Pt > 75%)  Function - Toileting Toileting activity did not occur: No continent bowel/bladder event Toileting steps completed by patient: Performs perineal hygiene Toileting steps completed by helper: Adjust clothing after toileting, Adjust clothing prior to toileting Toileting Assistive Devices: Grab bar or rail Assist level: Touching or steadying assistance (Pt.75%)  Function - ArchivistToilet Transfers Toilet transfer assistive device: Grab bar Assist level to toilet: Moderate assist (Pt 50 - 74%/lift or lower) Assist level from toilet: Moderate assist (Pt 50 - 74%/lift or lower)  Function - Chair/bed transfer Chair/bed transfer method: Squat pivot Chair/bed transfer assist level: Touching or steadying assistance (Pt > 75%) Chair/bed transfer assistive device: Armrests Chair/bed transfer details: Tactile cues for sequencing, Tactile cues for weight shifting, Verbal cues for sequencing, Verbal cues for technique, Manual facilitation for weight shifting, Verbal cues for safe use of DME/AE, Visual cues/gestures for sequencing  Function - Locomotion: Wheelchair Will patient use wheelchair at discharge?:  (TBD) Type: Manual Max wheelchair distance: 13650ft  Assist Level:  Supervision or verbal cues Assist Level: Supervision or verbal cues Assist Level: Supervision or verbal cues Turns around,maneuvers  to table,bed, and toilet,negotiates 3% grade,maneuvers on rugs and over doorsills: No Function - Locomotion: Ambulation Assistive device: Rail in hallway Max distance: 15 Assist level: 2 helpers Assist level: 2 helpers Walk 50 feet with 2 turns activity did not occur: Safety/medical concerns Walk 150 feet activity did not occur: Safety/medical concerns Walk 10 feet on uneven surfaces activity did not occur: Safety/medical concerns  Function - Comprehension Comprehension: Auditory Comprehension assist level: Follows complex conversation/direction with extra time/assistive device  Function - Expression Expression: Verbal Expression assist level: Expresses complex ideas: With extra time/assistive device  Function - Social Interaction Social Interaction assist level: Interacts appropriately with others with medication or extra time (anti-anxiety, antidepressant).  Function - Problem Solving Problem solving assist level: Solves basic problems with no assist  Function - Memory Memory assist level: Recognizes or recalls 90% of the time/requires cueing < 10% of the time Patient normally able to recall (first 3 days only): Current season, That he or she is in a hospital, Staff names and faces, Location of own room   Medical Problem List and Plan: 1. Left hemiparesis secondary to Right MCA infarct  ASA and Plavix  Cont CIR   Notes reviewed, images reviewed 2. DVT Prophylaxis/Anticoagulation: Pharmaceutical: Lovenox   Plt 294K on 7/25 3. Migraine Headaches/Pain Management: has been getting Toradol since 7/14. On low dose topamax--will change to nights to help with sedation and may need titrated upwards. No current HA  4. Mood: LCSW to follow for evaluation and support.  5. Neuropsych: This patient is not fullycapable of making decisions on herown  behalf. 6. Skin/Wound Care: routine pressure relief measures  7. Fluids/Electrolytes/Nutrition: Liquids advanced to thins Monitor I/O.  Low alb will supplement with prostat  BMP within acceptable range on 7/25 8. Dyslipidemia: On Lipitor. 9. Thyroid nodule: follow up on outpatient basis 10. Obesity: Educate on importance of weight loss with appropriate diet to promote health and mobility.  11. Question antiphospholipid syndrome: high Beta 2-glycoprotein IgG. Will need recheck labs in 12 weeks. 12.  Spasticity L Finger flexors resting hand splint Cont tizanidine 4mg  qhs 13.  Constipation  Started senna 2 po BID 14.  Rhinitis   Started claritin  LOS (Days) 7 A FACE TO FACE EVALUATION WAS PERFORMED  Ankit Karis Jubanil Patel 12/17/2016, 7:41 AM

## 2016-12-17 NOTE — Plan of Care (Signed)
Problem: RH PAIN MANAGEMENT Goal: RH STG PAIN MANAGED AT OR BELOW PT'S PAIN GOAL Outcome: Progressing Pain less than or equal to 2.   

## 2016-12-18 ENCOUNTER — Inpatient Hospital Stay (HOSPITAL_COMMUNITY): Payer: Self-pay

## 2016-12-18 DIAGNOSIS — G8110 Spastic hemiplegia affecting unspecified side: Secondary | ICD-10-CM | POA: Insufficient documentation

## 2016-12-18 MED ORDER — BENEPROTEIN PO POWD
1.0000 | Freq: Three times a day (TID) | ORAL | Status: DC
  Administered 2016-12-18 – 2016-12-22 (×6): 6 g via ORAL
  Filled 2016-12-18: qty 227

## 2016-12-18 NOTE — Progress Notes (Signed)
Subjective/Complaints: Pt seen laying in bed this AM.  Daughter at bedside.  Pt slept well overnight.  Daughter requests to d/c protein supplement as pt does not like it.    ROS- Denies CP, SOB, N/V/D   Objective: Vital Signs: Blood pressure 97/69, pulse 82, temperature 97.6 F (36.4 C), temperature source Oral, resp. rate 18, height 5\' 3"  (1.6 m), weight 84.9 kg (187 lb 2 oz), SpO2 96 %. No results found. Results for orders placed or performed during the hospital encounter of 12/10/16 (from the past 72 hour(s))  Glucose, capillary     Status: Abnormal   Collection Time: 12/17/16 11:57 AM  Result Value Ref Range   Glucose-Capillary 122 (H) 65 - 99 mg/dL     HEENT: Normocephalic, atraumatic Cardio: RRR and no JVD Resp: CTA B/L and unlabored GI: BS positive and ND Skin:   Intact. Warm and dry Neuro: Lethargic,  Motor: 4-/5 Left HF KE, 0/5 at ankle  0/5 in LUE with spastic tone throughout Musc/Skel:  No edema, no tenderness Gen NAD. Vital signs reivewed  Assessment/Plan: 1. Functional deficits secondary to Right MCA infarct which require 3+ hours per day of interdisciplinary therapy in a comprehensive inpatient rehab setting. Physiatrist is providing close team supervision and 24 hour management of active medical problems listed below. Physiatrist and rehab team continue to assess barriers to discharge/monitor patient progress toward functional and medical goals. FIM: Function - Bathing Position: Shower Body parts bathed by patient: Left arm, Chest, Abdomen, Front perineal area, Right upper leg, Left upper leg Body parts bathed by helper: Right arm, Buttocks, Right lower leg, Back, Left lower leg Assist Level: Touching or steadying assistance(Pt > 75%)  Function- Upper Body Dressing/Undressing What is the patient wearing?: Pull over shirt/dress Pull over shirt/dress - Perfomed by patient: Thread/unthread right sleeve, Put head through opening Pull over shirt/dress -  Perfomed by helper: Thread/unthread left sleeve, Pull shirt over trunk Assist Level: Touching or steadying assistance(Pt > 75%) Function - Lower Body Dressing/Undressing What is the patient wearing?: Pants, Non-skid slipper socks Position: Sitting EOB Underwear - Performed by patient: Thread/unthread right underwear leg Underwear - Performed by helper: Thread/unthread right underwear leg, Thread/unthread left underwear leg, Pull underwear up/down Pants- Performed by patient: Thread/unthread right pants leg Pants- Performed by helper: Thread/unthread left pants leg, Pull pants up/down Non-skid slipper socks- Performed by helper: Don/doff right sock, Don/doff left sock Socks - Performed by patient: Don/doff right sock Socks - Performed by helper: Don/doff right sock, Don/doff left sock Shoes - Performed by patient: Don/doff right shoe Shoes - Performed by helper: Don/doff left shoe Assist for footwear: Partial/moderate assist Assist for lower body dressing: Touching or steadying assistance (Pt > 75%)  Function - Toileting Toileting activity did not occur: No continent bowel/bladder event Toileting steps completed by patient: Performs perineal hygiene Toileting steps completed by helper: Adjust clothing after toileting, Adjust clothing prior to toileting Toileting Assistive Devices: Grab bar or rail Assist level: Touching or steadying assistance (Pt.75%)  Function - ArchivistToilet Transfers Toilet transfer assistive device: Grab bar Assist level to toilet: Moderate assist (Pt 50 - 74%/lift or lower) Assist level from toilet: Moderate assist (Pt 50 - 74%/lift or lower)  Function - Chair/bed transfer Chair/bed transfer method: Squat pivot Chair/bed transfer assist level: Moderate assist (Pt 50 - 74%/lift or lower) Chair/bed transfer assistive device: Armrests Chair/bed transfer details: Tactile cues for sequencing, Tactile cues for weight shifting, Verbal cues for sequencing, Verbal cues for  technique, Manual facilitation for weight  shifting, Verbal cues for safe use of DME/AE, Visual cues/gestures for sequencing  Function - Locomotion: Wheelchair Will patient use wheelchair at discharge?:  (TBD) Type: Manual Max wheelchair distance: 11150ft  Assist Level: Supervision or verbal cues Assist Level: Supervision or verbal cues Assist Level: Supervision or verbal cues Turns around,maneuvers to table,bed, and toilet,negotiates 3% grade,maneuvers on rugs and over doorsills: No Function - Locomotion: Ambulation Assistive device: Rail in hallway Max distance: 15 Assist level: 2 helpers Assist level: 2 helpers Walk 50 feet with 2 turns activity did not occur: Safety/medical concerns Walk 150 feet activity did not occur: Safety/medical concerns Walk 10 feet on uneven surfaces activity did not occur: Safety/medical concerns  Function - Comprehension Comprehension: Auditory Comprehension assist level: Follows complex conversation/direction with extra time/assistive device  Function - Expression Expression: Verbal Expression assist level: Expresses complex ideas: With extra time/assistive device  Function - Social Interaction Social Interaction assist level: Interacts appropriately with others with medication or extra time (anti-anxiety, antidepressant).  Function - Problem Solving Problem solving assist level: Solves basic problems with no assist  Function - Memory Memory assist level: Recognizes or recalls 90% of the time/requires cueing < 10% of the time Patient normally able to recall (first 3 days only): Current season, That he or she is in a hospital, Staff names and faces, Location of own room   Medical Problem List and Plan: 1. Left hemiparesis secondary to Right MCA infarct  ASA and Plavix  Cont CIR   Notes reviewed, images reviewed 2. DVT Prophylaxis/Anticoagulation: Pharmaceutical: Lovenox   Plt 294K on 7/25 3. Migraine Headaches/Pain Management: has been  getting Toradol since 7/14. On low dose topamax--will change to nights to help with sedation and may need titrated upwards. No current HA  4. Mood: LCSW to follow for evaluation and support.  5. Neuropsych: This patient is not fullycapable of making decisions on herown behalf. 6. Skin/Wound Care: routine pressure relief measures  7. Fluids/Electrolytes/Nutrition: Liquids advanced to thins Monitor I/O.  Low alb will supplement with prostat  BMP within acceptable range on 7/25   Protein supplement changed due to pt preference 8. Dyslipidemia: On Lipitor. 9. Thyroid nodule: follow up on outpatient basis 10. Obesity: Educate on importance of weight loss with appropriate diet to promote health and mobility.  11. Question antiphospholipid syndrome: high Beta 2-glycoprotein IgG. Will need recheck labs in 12 weeks. 12.  Spasticity LUE  resting hand splint   Cont tizanidine 4mg  qhs, may need further titration 13.  Constipation  Started senna 2 po BID 14.  Rhinitis   Started claritin  LOS (Days) 8 A FACE TO FACE EVALUATION WAS PERFORMED  Chigozie Basaldua Karis Jubanil Yicel Shannon 12/18/2016, 7:11 AM

## 2016-12-18 NOTE — Progress Notes (Signed)
Occupational Therapy Session Note  Patient Details  Name: Shannon Erickson MRN: [id number] Date of Birth: 03/14/1976  Today's Date: 12/18/2016 OT Individual Time: 1115-1200 OT Individual Time Calculation (min): 45 min    Short Term Goals: Week 1:  OT Short Term Goal 1 (Week 1): Pt will sit to stand consistantly with MOD A in prep for clothing management OT Short Term Goal 2 (Week 1): Pt will maintain midline during bathing tasks while seated with supervision OT Short Term Goal 3 (Week 1): Pt will locate all needed grooming items on L of sink with MIN VC OT Short Term Goal 4 (Week 1): Pt will recall hemi techniques for UB dressing for 2 consecutive sessions  OT Short Term Goal 5 (Week 1): [facility] and LRAD PRN.  Skilled Therapeutic Interventions/Progress Updates:    1:1. Pt stand pivot transfer throughout session with MIN A to R with VC for safety awareness as pt is impulsive and will attempt to strasfer without A. Focus of session on weight bearing LLE and LUE and AAROM of LUE. Pt completes towel glides 1x15 with RUE hand over hand A of  LUE in for/backward, diagonals, horizontal lines, and circles with MOD A for sit to stand and up to MAX A for standing balance 2/2 adductor and extensor tone in LLE. OT facilitating weight shift to R and giving VC for reaching far right to break up pushing L. Pt completes 1x10 on UE ranger with MAX A to reach full ROM in horizontal ab/adduction, flex/ext, and bicep flex/ext. Exited session with pt semi reclined in bed with bed exit alarm on, call light in reach, and daughter in law present.   Therapy Documentation Precautions:  Precautions Precautions: Fall Precaution Comments: L hemiplegia Restrictions Weight Bearing Restrictions: Yes General:   Vital Signs:   Pain: Pain Assessment Pain Assessment: No/denies pain Pain Score: 0-No pain  See Function Navigator for Current Functional Status.   Therapy/Group: Individual  Therapy  Shon HaleStephanie M Anitria Andon 12/18/2016, 12:06 PM

## 2016-12-19 ENCOUNTER — Inpatient Hospital Stay (HOSPITAL_COMMUNITY): Payer: Self-pay | Admitting: Physical Therapy

## 2016-12-19 ENCOUNTER — Inpatient Hospital Stay (HOSPITAL_COMMUNITY): Payer: Medicaid Other | Admitting: Occupational Therapy

## 2016-12-19 ENCOUNTER — Inpatient Hospital Stay (HOSPITAL_COMMUNITY): Payer: Self-pay

## 2016-12-19 ENCOUNTER — Encounter (HOSPITAL_COMMUNITY): Payer: Self-pay

## 2016-12-19 LAB — ANTINUCLEAR ANTIBODIES, IFA: ANTINUCLEAR ANTIBODIES, IFA: NEGATIVE

## 2016-12-19 MED ORDER — DIPHENHYDRAMINE HCL 12.5 MG/5ML PO ELIX: 12.5000 mg | ORAL_SOLUTION | Freq: Four times a day (QID) | ORAL | Status: DC | PRN

## 2016-12-19 MED ORDER — PROCHLORPERAZINE MALEATE 5 MG PO TABS
5.0000 mg | ORAL_TABLET | Freq: Four times a day (QID) | ORAL | Status: DC | PRN
  Administered 2016-12-21: 5 mg via ORAL
  Filled 2016-12-19: qty 1

## 2016-12-19 MED ORDER — TRAZODONE HCL 50 MG PO TABS
25.0000 mg | ORAL_TABLET | Freq: Every evening | ORAL | Status: DC | PRN
  Administered 2017-01-10: 25 mg via ORAL
  Filled 2016-12-19: qty 1

## 2016-12-19 MED ORDER — GUAIFENESIN-DM 100-10 MG/5ML PO SYRP: 10.0000 mL | ORAL_SOLUTION | Freq: Four times a day (QID) | ORAL | Status: DC | PRN

## 2016-12-19 MED ORDER — PROCHLORPERAZINE 25 MG RE SUPP: 12.5000 mg | Freq: Four times a day (QID) | RECTAL | Status: DC | PRN

## 2016-12-19 MED ORDER — ACETAMINOPHEN 325 MG PO TABS
650.0000 mg | ORAL_TABLET | ORAL | Status: DC | PRN
  Administered 2016-12-21 – 2017-01-01 (×5): 650 mg via ORAL
  Filled 2016-12-19 (×5): qty 2

## 2016-12-19 MED ORDER — PROCHLORPERAZINE EDISYLATE 5 MG/ML IJ SOLN: 5.0000 mg | Freq: Four times a day (QID) | INTRAMUSCULAR | Status: DC | PRN

## 2016-12-19 NOTE — Consult Note (Signed)
Neuropsychological Consultation   Patient:   Shannon Erickson   DOB:   03/10/1976  MR Number:  [id number]  [facility]  [facility] [address] [address] [id number] [place] [id number] Dept: [phone] Loc: [phone]           Date of Service:   12/19/2016  Start Time:   9 AM End Time:   10 AM  Provider/Observer:  Arley PhenixJohn Ahmad Vanwey, Psy.D.       Clinical Neuropsychologist       Billing Code/Service: [phone] 4 Units  Chief Complaint:    Shannon Erickson is a 41 year old female who was admitted on 12/03/2016 with severe right frontotemporal headache, slurred speech, lethargy and left-sided weakness. MRI revealed large right MCA territory nonhemorrhagic infarct involving the left temporal lobe, left frontal lobe, insula, basal ganglia and regional white matter. The patient experienced resulting left-sided weakness, left inattention, and delayed processing speed and mental processing speed. Expressive language deficits primarily related to dysphagia. This does not appear to be language deficits related to primary language centers in the brain but more with motor control affecting articulation.   Reason for Service:  Shannon Erickson was referred for psychological consultation to assess for current cognitive functioning and understanding as far as mental status as well as to look at issues of adjustment and coping to the sudden change she has experienced since the nonhemorrhagic stroke. Below is the history of present illness for the current admission.  HPI: Shannon Erickson is a [age] year old female with history of meningitis, migraines who was admitted on 12/03/16 with severe right frontoparietal HA, slurred speech, lethargyand left sided weakness. CT head negative. MRI brain done revealing "Large RIGHT MCA territory nonhemorrhagic infarct involving temporal lobe, frontal lobe, insula, basal ganglia and regional white  matter with early mass effect and 1-2 mm. EEG done showing right hemispheric slowing with diffuse cerebral dysfunction due to encephalopathy. BLE dopplers were negative for DVT. TEE done revealing EF 50% with no thrombus, no PFO or ASD. CTA neck showed focal irregularity at right carotid bifurcation with self like plaque and 11 mm thyroid nodule. Stroke felt to be embolic from R-ICA plaque and Dr. Roda ShuttersXu recommended ASA/Plavix X 3 months followed by Plavix alone. Follow up CT head 7/19 stable and lethargy resolving with improvement in headaches. Coagulopathy panel with high beta 2 glycoprotein IgG and will need repeat test in 12 weeks due question of antiphospholipid syndrome. Patient with resultant left sided weakness, left inattention, delayed processing with cognitive deficits and dysphagia. CIR recommended by rehab team.  Current Status:  Today,the patient appeared to be doing better from earlier in her treatment course with regard to language. The patient did not appear to be have any significant word finding difficulties, paraphrasing errors or circumlocutions. Her primary deficits had to do with articulation issues related to right hemispheric stroke. The patient reports that her memory appears to be doing well but there may be some indications of left-sided neglect. The patient's daughter did have to remind her about her left hand in issues have to do with the brace.  Reliability of Information: Information is derived from a 1 hour face-to-face clinical interview with the patient, discussions with the patient's daughter about what her perception of changes are since the stroke as well as review of medical records and discussion with treatment team members.  Behavioral Observation: Shannon Erickson  presents as a 41 y.o.-year-old Right Caucasian Female who appeared her stated age.  her dress was Appropriate and she was Well Groomed and her manners were Appropriate to the situation.  her participation  was indicative of Appropriate and Attentive behaviors.  There were physical disabilities noted relative to left side hemiparesis.  she displayed an appropriate level of cooperation and motivation.     Interactions:    Active Appropriate  Attention:   abnormal and attention span appeared shorter than expected for age  Memory:   abnormal; remote memory intact, recent memory impaired  Visuo-spatial:  abnormal  Possibly related to left sided neglect.  Speech (Volume):  low  Speech:   Some articulation errors but no paraphasic errors or circumlocutions. This does not appear to be a nonfluent aphasia (Broca's aphasia or fluent aphasia.  Thought Process:  Coherent and Relevant  Though Content:  WNL; not suicidal and not homicidal  Orientation:   person, place, time/date and situation  Judgment:   Fair  Planning:   Fair  Affect:    Depressed  Mood:    Depressed  Insight:   Fair  Intelligence:   normal  Medical History:   Past Medical History:  Diagnosis Date  . Lesion of left ulnar nerve   . Meningitis 1995  . Migraine         Family History  Problem Relation Age of Onset  . Lung cancer Mother        mets to brain, bone, liver  . Hypertension Father   . Heart attack Brother        at age 41  . Renal cancer Maternal Grandfather   . Heart attack Paternal Grandfather     Risk of Suicide/Violence: low patient denies suicidal ideation.  Impression/DX:  Shannon Erickson is a 41 year old female who was admitted on 12/03/2016 with severe right frontotemporal headache, slurred speech, lethargy and left-sided weakness. MRI revealed large right MCA territory nonhemorrhagic infarct involving the left temporal lobe, left frontal lobe, insula, basal ganglia and regional white matter. The patient experienced resulting left-sided weakness, left inattention, and delayed processing speed and mental processing speed. Expressive language deficits primarily related to dysphagia. This does  not appear to be language deficits related to primary language centers in the brain but more with motor control affecting articulation.   The patient did well on MMSE but did have some constructional issues (possibly due to neglect).  She has some issues with attention (appears mostly due to encoding, sustaining, and focus execute issues).  However, these are mild and appear to be improving looking back at prior medical records. The patient's mental status and level of attentional abilities do, however, remain at a functional level that would allow the patient to fully benefit from rehabilitative efforts.  Disposition/Plan:  I will see the patient again later this week (August 1) to assess further improvement in overall cognitive functioning.  Diagnosis:   Large right MCA territory nonhemorrhagic infarct        Electronically Signed   _______________________ Arley PhenixJohn Terryl Molinelli, Psy.D.

## 2016-12-19 NOTE — Plan of Care (Signed)
Problem: RH SKIN INTEGRITY Goal: RH STG SKIN FREE OF INFECTION/BREAKDOWN With min assist  Outcome: Progressing No skin issues  Problem: RH SAFETY Goal: RH STG DECREASED RISK OF FALL WITH ASSISTANCE STG Decreased Risk of Fall With min Assistance.   Outcome: Progressing No safety issues, no fall or injury this shift  Problem: RH PAIN MANAGEMENT Goal: RH STG PAIN MANAGED AT OR BELOW PT'S PAIN GOAL Outcome: Progressing Denies pain

## 2016-12-19 NOTE — Progress Notes (Signed)
Occupational Therapy Weekly Progress Note  Patient Details  Name: Shannon Erickson MRN: [id number] Date of Birth: [birth year]  Beginning of progress report period: December 11, 2016 End of progress report period: December 19, 2016  Today's Date: 12/19/2016 OT Individual Time: 0093-8182 OT Individual Time Calculation (min): 60 min    Patient has met 4 of 5 short term goals.  Pt is making steady progress with OT treatments at this time.  She is able to complete stand-pivot transfers with overall mod A to weaker L side and min A to R. Improved postural control and L side attention. L UE with increased tone, however pt is progressing with hemi-techniques and utilized L UE as a stabilizer.  Will continue with current OT treatment POC.    Patient continues to demonstrate the following deficits: muscle weakness, abnormal tone and decreased coordination, decreased midline orientation and decreased attention to left and decreased sitting balance, decreased standing balance, decreased postural control, hemiplegia and decreased balance strategies and therefore will continue to benefit from skilled OT intervention to enhance overall performance with BADL and Reduce care partner burden.  Patient progressing toward long term goals..  Continue plan of care.  OT Short Term Goals Week 1:  OT Short Term Goal 1 (Week 1): Pt will sit to stand consistantly with MOD A in prep for clothing management OT Short Term Goal 1 - Progress (Week 1): Met OT Short Term Goal 2 (Week 1): Pt will maintain midline during bathing tasks while seated with supervision OT Short Term Goal 2 - Progress (Week 1): Progressing toward goal OT Short Term Goal 3 (Week 1): Pt will locate all needed grooming items on L of sink with MIN VC OT Short Term Goal 3 - Progress (Week 1): Met OT Short Term Goal 4 (Week 1): Pt will recall hemi techniques for UB dressing for 2 consecutive sessions  OT Short Term Goal 4 - Progress (Week 1): Met OT Short Term  Goal 5 (Week 1): Pt will transfer to [facility] with MOD A and LRAD PRN. OT Short Term Goal 5 - Progress (Week 1): Met Week 2:  OT Short Term Goal 1 (Week 2): Pt will maintain midline during bathing tasks while seated with supervision OT Short Term Goal 2 (Week 2): Pt will demonstrate proficiency with self ROM of LUE with min cues or less. OT Short Term Goal 3 (Week 2): Pt will demonstrate improved LUE body awareness by using R hand to position L arm prior to transfers. OT Short Term Goal 4 (Week 2): Pt will complete LB dressing with overall Mod A using hemi-techniques  Skilled Therapeutic Interventions/Progress Updates:    Pt awake and eager to participate in OT this morning. Pt transferred sup<>sit with min A to advance LLE and min A to elevate trunk. Pt with increased tone noted in L UE. Provided gentle massage and passive ROM and tone decreased. Squat-pivot transfer completed to L with mod A bed>wc>shower chair. Bathing completed with VC to utilize L UE as a stabilizer, then incorporated weight bearing NMR techniques to wash. Lateral LOB to L when trying to hike hip to wash buttocks requiring mod A to maintain dynamic balance. Pt unable to wash buttocks thoroughly requiring assist from therapist. Pt transferred out of shower to R-min A. Brought wc to sink for dressing. Min A to achieve figure 4 position, then pt able to thread pant legs. Sit<>stand w/ min/mod A, then addressed standing balance while pt reached to pull up pants. Min/Mod A for standing  balance. OT added shoe buttons for increased independence with LB self-care. Pt left seated in wc at end of session with daughter present and needs met.   Therapy Documentation Precautions:  Precautions Precautions: Fall Precaution Comments: L hemiplegia Restrictions Weight Bearing Restrictions: Yes Pain: Pain Assessment Pain Assessment: No/denies pain Pain Score: 0-No pain  See Function Navigator for Current Functional Status.   Therapy/Group:  Individual Therapy  Valma Cava 12/19/2016, 12:42 PM

## 2016-12-19 NOTE — Progress Notes (Signed)
Speech Language Pathology Daily Session Note  Patient Details  Name: Shannon Erickson MRN: [id number] Date of Birth: 06/30/1975  Today's Date: 12/19/2016 SLP Individual Time: 1310-1345 SLP Individual Time Calculation (min): 35 min  Short Term Goals: Week 2: SLP Short Term Goal 1 (Week 2): Pt will use external memory aids to recall new daily information with Min A cues.  SLP Short Term Goal 2 (Week 2): Pt will complete semi-complex problem solving tasks with Min A cues.  SLP Short Term Goal 3 (Week 2): Pt will scan to left of her environment with Min A cues to locate objects during functional tasks.  SLP Short Term Goal 4 (Week 2): Pt will demonstrate anticipatory awareness by listing 3 activities that are safe for her to participate in at discharge with Min A cues.   Skilled Therapeutic Interventions: Skilled ST treatment focused on cognitive goals. SLP facilitated memory and problem solving skills with novel card game. Pt required Min A verbal cues for problem solving task and Min A for recall task. SLP educated pt on recall strategies to use at home and in her work environment. Pt was left in chair in room with call bell within reach. Recommend to continue ST services.     Function:  Eating Eating                 Cognition Comprehension Comprehension assist level: Follows complex conversation/direction with extra time/assistive device  Expression   Expression assist level: Expresses complex ideas: With extra time/assistive device  Social Interaction Social Interaction assist level: Interacts appropriately with others - No medications needed.  Problem Solving Problem solving assist level: Solves basic problems with no assist  Memory Memory assist level: Recognizes or recalls 75 - 89% of the time/requires cueing 10 - 24% of the time    Pain Pain Assessment Pain Assessment: No/denies pain  Therapy/Group: Individual Therapy  Shannon Erickson  [facility] 12/19/2016, 4:45 PM

## 2016-12-19 NOTE — Progress Notes (Signed)
Subjective/Complaints: Funny sensation Left foot  ROS- Denies CP, SOB, N/V/D   Objective: Vital Signs: Blood pressure 109/65, pulse 85, temperature 97.8 F (36.6 C), temperature source Oral, resp. rate 18, height 5\' 3"  (1.6 m), weight 84.9 kg (187 lb 2 oz), SpO2 100 %. No results found. Results for orders placed or performed during the hospital encounter of 12/10/16 (from the past 72 hour(s))  Glucose, capillary     Status: Abnormal   Collection Time: 12/17/16 11:57 AM  Result Value Ref Range   Glucose-Capillary 122 (H) 65 - 99 mg/dL     HEENT: Normocephalic, atraumatic Cardio: RRR and no JVD Resp: CTA B/L and unlabored GI: BS positive and ND Skin:   Intact. Warm and dry Neuro: Lethargic,  Motor: 4-/5 Left HF KE, 0/5 at ankle  0/5 in LUE with spastic tone throughout Musc/Skel:  No edema, no tenderness Gen NAD. Vital signs reivewed  Assessment/Plan: 1. Functional deficits secondary to Right MCA infarct which require 3+ hours per day of interdisciplinary therapy in a comprehensive inpatient rehab setting. Physiatrist is providing close team supervision and 24 hour management of active medical problems listed below. Physiatrist and rehab team continue to assess barriers to discharge/monitor patient progress toward functional and medical goals. FIM: Function - Bathing Position: Shower Body parts bathed by patient: Left arm, Chest, Abdomen, Front perineal area, Right upper leg, Left upper leg Body parts bathed by helper: Right arm, Buttocks, Right lower leg, Back, Left lower leg Assist Level: Touching or steadying assistance(Pt > 75%)  Function- Upper Body Dressing/Undressing What is the patient wearing?: Pull over shirt/dress Pull over shirt/dress - Perfomed by patient: Thread/unthread right sleeve, Put head through opening Pull over shirt/dress - Perfomed by helper: Thread/unthread left sleeve, Pull shirt over trunk Assist Level: Touching or steadying assistance(Pt >  75%) Function - Lower Body Dressing/Undressing What is the patient wearing?: Pants, Non-skid slipper socks Position: Sitting EOB Underwear - Performed by patient: Thread/unthread right underwear leg Underwear - Performed by helper: Thread/unthread right underwear leg, Thread/unthread left underwear leg, Pull underwear up/down Pants- Performed by patient: Thread/unthread right pants leg Pants- Performed by helper: Thread/unthread left pants leg, Pull pants up/down Non-skid slipper socks- Performed by helper: Don/doff right sock, Don/doff left sock Socks - Performed by patient: Don/doff right sock Socks - Performed by helper: Don/doff right sock, Don/doff left sock Shoes - Performed by patient: Don/doff right shoe Shoes - Performed by helper: Don/doff left shoe Assist for footwear: Partial/moderate assist Assist for lower body dressing: Touching or steadying assistance (Pt > 75%)  Function - Toileting Toileting activity did not occur: No continent bowel/bladder event Toileting steps completed by patient: Performs perineal hygiene Toileting steps completed by helper: Adjust clothing after toileting, Adjust clothing prior to toileting Toileting Assistive Devices: Grab bar or rail Assist level: Touching or steadying assistance (Pt.75%)  Function - ArchivistToilet Transfers Toilet transfer assistive device: Grab bar Assist level to toilet: Moderate assist (Pt 50 - 74%/lift or lower) Assist level from toilet: Moderate assist (Pt 50 - 74%/lift or lower)  Function - Chair/bed transfer Chair/bed transfer method: Squat pivot Chair/bed transfer assist level: Touching or steadying assistance (Pt > 75%) Chair/bed transfer assistive device: Armrests Chair/bed transfer details: Tactile cues for sequencing, Tactile cues for weight shifting, Verbal cues for sequencing, Verbal cues for technique, Manual facilitation for weight shifting, Verbal cues for safe use of DME/AE, Visual cues/gestures for  sequencing  Function - Locomotion: Wheelchair Will patient use wheelchair at discharge?:  (TBD) Type: Manual Max  wheelchair distance: 1450ft  Assist Level: Supervision or verbal cues Assist Level: Supervision or verbal cues Assist Level: Supervision or verbal cues Turns around,maneuvers to table,bed, and toilet,negotiates 3% grade,maneuvers on rugs and over doorsills: No Function - Locomotion: Ambulation Assistive device: Rail in hallway Max distance: 15 Assist level: 2 helpers Assist level: 2 helpers Walk 50 feet with 2 turns activity did not occur: Safety/medical concerns Walk 150 feet activity did not occur: Safety/medical concerns Walk 10 feet on uneven surfaces activity did not occur: Safety/medical concerns  Function - Comprehension Comprehension: Auditory Comprehension assist level: Follows complex conversation/direction with extra time/assistive device  Function - Expression Expression: Verbal Expression assist level: Expresses complex ideas: With extra time/assistive device  Function - Social Interaction Social Interaction assist level: Interacts appropriately with others with medication or extra time (anti-anxiety, antidepressant).  Function - Problem Solving Problem solving assist level: Solves basic problems with no assist  Function - Memory Memory assist level: Recognizes or recalls 90% of the time/requires cueing < 10% of the time Patient normally able to recall (first 3 days only): Current season, That he or she is in a hospital, Staff names and faces, Location of own room   Medical Problem List and Plan: 1. Left hemiparesis secondary to Right MCA infarct  ASA and Plavix  Cont CIR   PT,OT, SLP 2. DVT Prophylaxis/Anticoagulation: Pharmaceutical: Lovenox   Plt 294K on 7/25 3. Migraine Headaches/Pain Management: has been getting Toradol since 7/14. On low dose topamax--will change to nights to help with sedation and may need titrated upwards. Sensory  dysesthesia LLE will try increasing dose of topamax , if not effective may d/c topamax and trial gabapentin 4. Mood: LCSW to follow for evaluation and support.  5. Neuropsych: This patient is not fullycapable of making decisions on herown behalf. 6. Skin/Wound Care: routine pressure relief measures  7. Fluids/Electrolytes/Nutrition: Liquids advanced to thins Monitor I/O.  Low alb will supplement with prostat  BMP within acceptable range on 7/25   Protein supplement changed due to pt preference 8. Dyslipidemia: On Lipitor. 9. Thyroid nodule: follow up on outpatient basis 10. Obesity: Educate on importance of weight loss with appropriate diet to promote health and mobility.  11. Question antiphospholipid syndrome: high Beta 2-glycoprotein IgG. Will need recheck labs in 12 weeks. 12.  Spasticity LUE  resting hand splint   Cont tizanidine 4mg  qhs, pt c/o abnl LLE sensation > abnl movement will not change at this point 13.  Constipation  Started senna 2 po BID 14.  Rhinitis   Started claritin  LOS (Days) 9 A FACE TO FACE EVALUATION WAS PERFORMED  Carina Chaplin E 12/19/2016, 7:27 AM

## 2016-12-19 NOTE — Progress Notes (Addendum)
Physical Therapy Weekly Progress Note  Patient Details  Name: Shannon Erickson MRN: [id number] Date of Birth: [birth year]  Beginning of progress report period: December 11, 2016 End of progress report period: December 19, 2016  Today's Date: 12/19/2016 PT Individual Time: 1400-1500 and 1600-1630 PT Individual Time Calculation (min): 60 min and 30 min (90 min total)   Patient has met 3 of 4 short term goals.  Pt is making excellent progress with therapy and currently performing transfers with min assist and w/c mobility with min assist.  Pt continues to require max assist for ambulation 2/2 increased tone in LLE and decreased L weight shift during ambulation.  Patient continues to demonstrate the following deficits muscle weakness, impaired timing and sequencing, abnormal tone, unbalanced muscle activation and decreased coordination, decreased attention to left, decreased awareness, decreased problem solving, decreased safety awareness and delayed processing and decreased sitting balance, decreased standing balance, decreased postural control, hemiplegia and decreased balance strategies and therefore will continue to benefit from skilled PT intervention to increase functional independence with mobility.  Patient progressing toward long term goals..  Continue plan of care.  PT Short Term Goals Week 1:  PT Short Term Goal 1 (Week 1): Pt will perform bed mobility minA PT Short Term Goal 1 - Progress (Week 1): Progressing toward goal PT Short Term Goal 2 (Week 1): Pt will perform transfers w/c <>bed consistent modA PT Short Term Goal 2 - Progress (Week 1): Met PT Short Term Goal 3 (Week 1): Pt will ambulate x25' maxA  PT Short Term Goal 3 - Progress (Week 1): Met PT Short Term Goal 4 (Week 1): Pt will demonstrate dynamic sitting balance with S x5 min PT Short Term Goal 4 - Progress (Week 1): Met Week 2:  PT Short Term Goal 1 (Week 2): Pt will ambulate 88' with LRAD and +1 assist PT Short Term Goal 2  (Week 2): Pt will demonstrate stand/pivot transfer with min assist PT Short Term Goal 3 (Week 2): Pt will negotiate 4 steps with 1 rail and max assist  Skilled Therapeutic Interventions/Progress Updates:   Session 1:   no c/o pain.  Session focus on NMR via forced used, weight bearing, and functional mobility.   Gait training 2x30' with rail in hallway, mod/max assist for placement/stabilization of LLE in stance with noted adductor tone when not in weight bearing on LLE.  Pt demonstrates improved gait speed, reciprocal stepping pattern, and upright posture with ambulation this session.    Sit<>stand throughout session with steady assist.  Squat/pivot to R and L with steady assist for safety throughout session.    NMR in standing with forward/retro stepping in place 9 trials to fatigue with rest breaks after 3rd and 6th trial.  Pt requires max fade to mod assist for L knee control in stance phase with improvement in L quad activation with cues for "up tall" and tactile cues at L glute.  Nustep x10 minutes with L hand attachment and theraband to encourage neutral LE rotation for reciprocal stepping pattern retraining, trunk rotation, and attention to midline orientation and timer.  Pt returned to room at end of session in w/c, call bell in reach and needs met.   Session 2:  No c/o pain.  Session focus on LLE and LUE NMR.  Pt propels w/c x150' to therapy gym with R hemi technique and supervision, 2 short rest breaks 2/2 RLE fatigue.  Squat/pivot L and R throughout session with steady assist for safety.  Pt completes 8  reps LLE support bridges, 10 reps BLE support bridges, and 8 reps L clamshells focus on eccentric control of each exercise.  Pt able to roll prone with mod assist for LLE/UE positioning and tolerates prone on elbows x5 minutes.  Pt able to complete 10 modified prone press ups from elbows for scap protraction and eccentric retraction.  Pt returned to supine with supervision, back to w/c and  back to bed at end of session with steady assist.  Call bell in reach and needs met.   Therapy Documentation Precautions:  Precautions Precautions: Fall Precaution Comments: L hemiplegia Restrictions Weight Bearing Restrictions: Yes   See Function Navigator for Current Functional Status.  Therapy/Group: Individual Therapy  Earnest Conroy Penven-Crew 12/19/2016, 4:31 PM

## 2016-12-19 NOTE — Progress Notes (Signed)
Speech Language Pathology Weekly Progress and Session Note  Patient Details  Name: [name] MRN: [id number] Date of Birth: May 22, 1976  Beginning of progress report period: December 12, 2016 End of progress report period: December 19, 2016  Today's Date: 12/19/2016 SLP Individual Time:  -     Short Term Goals: Week 1: SLP Short Term Goal 1 (Week 1): Pt will consume regular diet textures without overt s/s of aspiration and Mod I use of compensatory swallow strategies.  SLP Short Term Goal 1 - Progress (Week 1): Met SLP Short Term Goal 2 (Week 1): Pt will increase use speech intelligibility strategies to increase vocal intensity to achieve ~ 80% intelligibility at the sentence level. with Mod A cues. SLP Short Term Goal 2 - Progress (Week 1): Met SLP Short Term Goal 3 (Week 1): Pt will use external memory aids to recall new daily information with Mod A cues.  SLP Short Term Goal 3 - Progress (Week 1): Met SLP Short Term Goal 4 (Week 1): Pt will complete semi-complex problem solving tasks with Mod A cues.  SLP Short Term Goal 4 - Progress (Week 1): Met SLP Short Term Goal 5 (Week 1): Pt will scan to left of her environment with Mod A cues to locate objects during functional tasks.  SLP Short Term Goal 5 - Progress (Week 1): Met    New Short Term Goals: Week 2: SLP Short Term Goal 1 (Week 2): Pt will use external memory aids to recall new daily information with Min A cues.  SLP Short Term Goal 2 (Week 2): Pt will complete semi-complex problem solving tasks with Min A cues.  SLP Short Term Goal 3 (Week 2): Pt will scan to left of her environment with Min A cues to locate objects during functional tasks.  SLP Short Term Goal 4 (Week 2): Pt will demonstrate anticipatory awareness by listing 3 activities that are safe for her to participate in at discharge with Min A cues.   Weekly Progress Updates: Pt with great progress towards goals this reporting period as evidenced by meeting 6 of 6 STGs.  Pt with progress in all areas. Skilled ST continues to be indicated to target higher level cognitive function and increase functional independence and reduce caregiver burden.    Intensity: Minumum of 1-2 x/day, 30 to 90 minutes Frequency: 3 to 5 out of 7 days Duration/Length of Stay: 21 to 25 days Treatment/Interventions: Cognitive remediation/compensation;Cueing hierarchy;Functional tasks;Medication managment;Patient/family education;Therapeutic Activities;Internal/external aids   Daily Session       General    Pain Pain Assessment Pain Assessment: No/denies pain Pain Score: 0-No pain  Therapy/Group: Individual Therapy  Nedda Gains 12/19/2016, 3:51 PM

## 2016-12-20 ENCOUNTER — Inpatient Hospital Stay (HOSPITAL_COMMUNITY): Payer: Self-pay | Admitting: Physical Therapy

## 2016-12-20 ENCOUNTER — Inpatient Hospital Stay (HOSPITAL_COMMUNITY): Payer: Self-pay

## 2016-12-20 ENCOUNTER — Encounter (HOSPITAL_COMMUNITY): Payer: Self-pay

## 2016-12-20 ENCOUNTER — Inpatient Hospital Stay (HOSPITAL_COMMUNITY): Payer: Medicaid Other | Admitting: Occupational Therapy

## 2016-12-20 MED ORDER — TIZANIDINE HCL 2 MG PO TABS: 2.0000 mg | ORAL_TABLET | Freq: Two times a day (BID) | ORAL | Status: DC

## 2016-12-20 MED ORDER — TIZANIDINE HCL 2 MG PO TABS
1.0000 mg | ORAL_TABLET | Freq: Two times a day (BID) | ORAL | Status: DC
  Administered 2016-12-20 – 2016-12-26 (×12): 1 mg via ORAL
  Filled 2016-12-20 (×14): qty 1

## 2016-12-20 MED ORDER — SENNOSIDES-DOCUSATE SODIUM 8.6-50 MG PO TABS
1.0000 | ORAL_TABLET | Freq: Two times a day (BID) | ORAL | Status: DC
  Administered 2016-12-20 – 2016-12-23 (×5): 1 via ORAL
  Filled 2016-12-20 (×11): qty 1

## 2016-12-20 NOTE — Progress Notes (Signed)
Physical Therapy Session Note  Patient Details  Name: Shannon Erickson MRN: [id number] Date of Birth: 23-Jan-1976  Today's Date: 12/20/2016 PT Individual Time: 1530-1600 PT Individual Time Calculation (min): 30 min   Short Term Goals: Week 2:  PT Short Term Goal 1 (Week 2): Pt will ambulate 75' with LRAD and +1 assist PT Short Term Goal 2 (Week 2): Pt will demonstrate stand/pivot transfer with min assist PT Short Term Goal 3 (Week 2): Pt will negotiate 4 steps with 1 rail and max assist  Skilled Therapeutic Interventions/Progress Updates:    no c/o pain, pt sleeping on arrival but easily awakes.  Session focus on hands on family training for bed mobility and transfers as well and NMR via gait training.    Pt's daughter provides assist for supine<>sit and squat/pivot bed<>w/c throughout session with min verbal cues from PT for set up and safety.  Signed off daughter for safe transfers on safety sheet.    Gait training x30' with rail in hallway with mod/max assist for LLE stabilization and weight shift.  Progress to gait with hemiwalker x75' with mod/max assist with noted improvement in pt activation of LLE stabilizers in stance phase, verbal cues for sequencing and upright posture.  PT provided PROM stretch to pt's hamstrings/heelcords 3x30' for tone management.  Discussed increased tone with PA.  Pt returned to room and bed at end of session, call bell in reach and needs met.   Therapy Documentation Precautions:  Precautions Precautions: Fall Precaution Comments: L hemiplegia Restrictions Weight Bearing Restrictions: No   See Function Navigator for Current Functional Status.   Therapy/Group: Individual Therapy  Earnest Conroy Penven-Crew 12/20/2016, 4:09 PM

## 2016-12-20 NOTE — Progress Notes (Signed)
Orthopedic Tech Progress Note Patient Details:  Shannon Erickson 12/30/1975 [id number]  Patient ID: Shannon Erickson, female   DOB: 12/19/1975, 41 y.o.   MRN: [id number]   Shannon Erickson, Shannon Erickson 12/20/2016, 2:30 PM Called in advanced brace order; [facility]

## 2016-12-20 NOTE — Plan of Care (Signed)
Problem: RH SKIN INTEGRITY Goal: RH STG SKIN FREE OF INFECTION/BREAKDOWN With min assist  Outcome: Progressing No skin infection or breakdown noted  Problem: RH SAFETY Goal: RH STG ADHERE TO SAFETY PRECAUTIONS W/ASSISTANCE/DEVICE STG Adhere to Safety Precautions With  Min Assistance/Device.   Outcome: Progressing Patient and family are aware of safety precautions Goal: RH STG DECREASED RISK OF FALL WITH ASSISTANCE STG Decreased Risk of Fall With min Assistance.   Outcome: Progressing No fall or injury this shift  Problem: RH PAIN MANAGEMENT Goal: RH STG PAIN MANAGED AT OR BELOW PT'S PAIN GOAL Outcome: Progressing Denies pain

## 2016-12-20 NOTE — Progress Notes (Signed)
Physical Therapy Session Note  Patient Details  Name: Emaree Chiu MRN: [id number] Date of Birth: 1976-01-13  Today's Date: 12/20/2016 PT Individual Time: 0900-0930 PT Individual Time Calculation (min): 30 min   Short Term Goals: Week 1:  PT Short Term Goal 1 (Week 1): Pt will perform bed mobility minA PT Short Term Goal 1 - Progress (Week 1): Progressing toward goal PT Short Term Goal 2 (Week 1): Pt will perform transfers w/c <>bed consistent modA PT Short Term Goal 2 - Progress (Week 1): Met PT Short Term Goal 3 (Week 1): Pt will ambulate x25' maxA  PT Short Term Goal 3 - Progress (Week 1): Met PT Short Term Goal 4 (Week 1): Pt will demonstrate dynamic sitting balance with S x5 min PT Short Term Goal 4 - Progress (Week 1): Met  Skilled Therapeutic Interventions/Progress Updates:    no c/o pain. PT treatment session focused on transfers and L LE flexibility. Pt asleep supine in bed asleep with daughter present, Pt agreeable to PT. Pt supine to sit with mod assist for forward weight shifting. Squat pivot transfers throughout session with min assist for steadying and verbal cues for w/c setup for transfer. Pt performed w/c mobility 130f to gym using R hemi-technique with increased time and supervision for safety. Pt c/o of stiffness in L LE requesting stretching with increased tone noted. Pt sit to supine with min assist for steadying. PT performed side lying quad stretch and supine hamstring stretch 2x30seconds each to pt comfort. Pt supine to sit with mod assist for forward weight shift and pelvic rotation. Pt returned to w/c and left in room with daughter present and call bell in reach.  Therapy Documentation Precautions:  Precautions Precautions: Fall Precaution Comments: L hemiplegia Restrictions Weight Bearing Restrictions: No  See Function Navigator for Current Functional Status.   Therapy/Group: Individual Therapy  Lanijah Warzecha 12/20/2016, 12:07 PM

## 2016-12-20 NOTE — Progress Notes (Signed)
Physical Therapy Session Note  Patient Details  Name: [name] MRN: [id number] Date of Birth: 06/09/1975  Today's Date: 12/20/2016 PT Individual Time: 1300-1400 PT Individual Time Calculation (min): 60 min   Short Term Goals: Week 2:  PT Short Term Goal 1 (Week 2): Pt will ambulate 5950' with LRAD and +1 assist PT Short Term Goal 2 (Week 2): Pt will demonstrate stand/pivot transfer with min assist PT Short Term Goal 3 (Week 2): Pt will negotiate 4 steps with 1 rail and max assist  Skilled Therapeutic Interventions/Progress Updates:    no c/o pain. PT treatment session focused on dynamic standing balance with laundry and L NMR during standing weight shift for pre-gait. Pt sitting in w/c with daughter present upon arrival, agreeable to PT. PT wheeled pt in w/c to laundry room for energy conservation and time. Pt sit to stands from w/c with mod assist for balance in laundry room due to pt beginning task prior to gaining standing balance with verbal cues for safety. Pt moved clothes from washing machine to top of dryer with mod - max assist for standing balance with noticed increased left lean and L  LOB with mod assist for recovery. Pt placed clothes in dryer from w/c level with supervision. PT rolled pt in w/c to hallway for energy conservation. Pt performed sit to stands in hallway with min assist for balance and verbal/tactile cues for L weight shift for equal weight bearing. Pt x3 trials, with seated rest breaks between, of R stepping forward/back with R UE on handrail wtih min-mod assist for balance and L knee extension. PT providing verbal and tactile cues for L knee extension, L weight shift, and upright posture. Pt returned to room in w/c with call bell in reach.   Therapy Documentation Precautions:  Precautions Precautions: Fall Precaution Comments: L hemiplegia Restrictions Weight Bearing Restrictions: No   See Function Navigator for Current Functional  Status.   Therapy/Group: Individual Therapy  Nikolaus Pienta 12/20/2016, 4:08 PM

## 2016-12-20 NOTE — Progress Notes (Signed)
Patient with increasing tone affecting mobility today. Does have some activation of LLE--orthotist consulted for input on AFO.  Will add low dose Zanaflex during the day to help manage spasticity.

## 2016-12-20 NOTE — Progress Notes (Signed)
Subjective/Complaints:  no foot pain or dysesthesia last noc  ROS- Denies CP, SOB, N/V/D   Objective: Vital Signs: Blood pressure 109/62, pulse 79, temperature 98 F (36.7 C), temperature source Oral, resp. rate 18, height 5\' 3"  (1.6 m), weight 84.9 kg (187 lb 2 oz), SpO2 97 %. No results found. Results for orders placed or performed during the hospital encounter of 12/10/16 (from the past 72 hour(s))  Glucose, capillary     Status: Abnormal   Collection Time: 12/17/16 11:57 AM  Result Value Ref Range   Glucose-Capillary 122 (H) 65 - 99 mg/dL     HEENT: Normocephalic, atraumatic Cardio: RRR and no JVD Resp: CTA B/L and unlabored GI: BS positive and ND Skin:   Intact. Warm and dry Neuro: Lethargic,  Motor: 4-/5 Left HF KE, 0/5 at ankle  0/5 in LUE with spastic tone throughout Musc/Skel:  No edema, no tenderness Gen NAD. Vital signs reivewed  Assessment/Plan: 1. Functional deficits secondary to Right MCA infarct which require 3+ hours per day of interdisciplinary therapy in a comprehensive inpatient rehab setting. Physiatrist is providing close team supervision and 24 hour management of active medical problems listed below. Physiatrist and rehab team continue to assess barriers to discharge/monitor patient progress toward functional and medical goals. FIM: Function - Bathing Position: Shower Body parts bathed by patient: Left arm, Chest, Abdomen, Front perineal area, Right upper leg, Left upper leg Body parts bathed by helper: Right arm, Buttocks, Right lower leg, Back, Left lower leg Assist Level: Touching or steadying assistance(Pt > 75%)  Function- Upper Body Dressing/Undressing What is the patient wearing?: Pull over shirt/dress Pull over shirt/dress - Perfomed by patient: Thread/unthread right sleeve, Put head through opening Pull over shirt/dress - Perfomed by helper: Thread/unthread left sleeve, Pull shirt over trunk Assist Level: Touching or steadying  assistance(Pt > 75%) Function - Lower Body Dressing/Undressing What is the patient wearing?: Pants, Non-skid slipper socks Position: Sitting EOB Underwear - Performed by patient: Thread/unthread right underwear leg Underwear - Performed by helper: Thread/unthread right underwear leg, Thread/unthread left underwear leg, Pull underwear up/down Pants- Performed by patient: Thread/unthread right pants leg Pants- Performed by helper: Thread/unthread left pants leg, Pull pants up/down Non-skid slipper socks- Performed by helper: Don/doff right sock, Don/doff left sock Socks - Performed by patient: Don/doff right sock Socks - Performed by helper: Don/doff right sock, Don/doff left sock Shoes - Performed by patient: Don/doff right shoe Shoes - Performed by helper: Don/doff left shoe Assist for footwear: Partial/moderate assist Assist for lower body dressing: Touching or steadying assistance (Pt > 75%)  Function - Toileting Toileting activity did not occur: No continent bowel/bladder event Toileting steps completed by patient: Performs perineal hygiene Toileting steps completed by helper: Adjust clothing after toileting, Adjust clothing prior to toileting Toileting Assistive Devices: Grab bar or rail Assist level: Touching or steadying assistance (Pt.75%)  Function - ArchivistToilet Transfers Toilet transfer assistive device: Grab bar Assist level to toilet: Moderate assist (Pt 50 - 74%/lift or lower) Assist level from toilet: Moderate assist (Pt 50 - 74%/lift or lower)  Function - Chair/bed transfer Chair/bed transfer method: Squat pivot Chair/bed transfer assist level: Touching or steadying assistance (Pt > 75%) Chair/bed transfer assistive device: Armrests Chair/bed transfer details: Tactile cues for sequencing, Tactile cues for weight shifting, Verbal cues for sequencing, Verbal cues for technique, Manual facilitation for weight shifting, Verbal cues for safe use of DME/AE, Visual cues/gestures for  sequencing  Function - Locomotion: Wheelchair Will patient use wheelchair at discharge?:  (  TBD) Type: Manual Max wheelchair distance: 11950ft  Assist Level: Supervision or verbal cues Assist Level: Supervision or verbal cues Assist Level: Supervision or verbal cues Turns around,maneuvers to table,bed, and toilet,negotiates 3% grade,maneuvers on rugs and over doorsills: No Function - Locomotion: Ambulation Assistive device: Rail in hallway Max distance: 15 Assist level: 2 helpers Assist level: 2 helpers Walk 50 feet with 2 turns activity did not occur: Safety/medical concerns Walk 150 feet activity did not occur: Safety/medical concerns Walk 10 feet on uneven surfaces activity did not occur: Safety/medical concerns  Function - Comprehension Comprehension: Auditory Comprehension assist level: Follows complex conversation/direction with extra time/assistive device  Function - Expression Expression: Verbal Expression assist level: Expresses complex ideas: With extra time/assistive device  Function - Social Interaction Social Interaction assist level: Interacts appropriately with others - No medications needed.  Function - Problem Solving Problem solving assist level: Solves basic problems with no assist  Function - Memory Memory assist level: Recognizes or recalls 75 - 89% of the time/requires cueing 10 - 24% of the time Patient normally able to recall (first 3 days only): Current season, That he or she is in a hospital, Staff names and faces, Location of own room   Medical Problem List and Plan: 1. Left hemiparesis secondary to Right MCA infarct  ASA and Plavix  Cont CIR   PT,OT, SLP- team conf in am 2. DVT Prophylaxis/Anticoagulation: Pharmaceutical: Lovenox   Plt 294K on 7/25 3. Migraine Headaches/Pain Management: has been getting Toradol since 7/14. On low dose topamax--will change to nights to help with sedation and may need titrated upwards. Sensory dysesthesia LLE did  not occur last noc will monitor 4. Mood: LCSW to follow for evaluation and support.  5. Neuropsych: This patient is not fullycapable of making decisions on herown behalf. 6. Skin/Wound Care: routine pressure relief measures  7. Fluids/Electrolytes/Nutrition: Liquids advanced to thins Monitor I/O.  Low alb will supplement with prostat  BMP within acceptable range on 7/25   Protein supplement changed due to pt preference 8. Dyslipidemia: On Lipitor. 9. Thyroid nodule: follow up on outpatient basis 10. Obesity: Educate on importance of weight loss with appropriate diet to promote health and mobility.  11. Question antiphospholipid syndrome: high Beta 2-glycoprotein IgG. Will need recheck labs in 12 weeks. 12.  Spasticity LUE  resting hand splint   Cont tizanidine 4mg  qhs, no spasm which disturb sleep 13.  Constipation  Started senna 2 po BID 14.  Rhinitis   Started claritin 15.  Urinary incont- cont toileting program- afebrile without dysuria LOS (Days) 10 A FACE TO FACE EVALUATION WAS PERFORMED  Valeda Corzine E 12/20/2016, 7:33 AM

## 2016-12-20 NOTE — Progress Notes (Addendum)
Occupational Therapy Session Note  Patient Details  Name: Devyn Griffing MRN: [id number] Date of Birth: 05-Dec-1975  Today's Date: 12/20/2016 OT Individual Time: 7583-0746 OT Individual Time Calculation (min): 60 min   Short Term Goals: Week 2:  OT Short Term Goal 1 (Week 2): Pt will maintain midline during bathing tasks while seated with supervision OT Short Term Goal 2 (Week 2): Pt will demonstrate proficiency with self ROM of LUE with min cues or less. OT Short Term Goal 3 (Week 2): Pt will demonstrate improved LUE body awareness by using R hand to position L arm prior to transfers. OT Short Term Goal 4 (Week 2): Pt will complete LB dressing with overall Mod A using hemi-techniques  Skilled Therapeutic Interventions/Progress Updates:    OT treatment session focused on L NMR, standing balance, and functional transfers. Sup<>sit from flat surface with min A to advance LLE, and mod A to elevate trunk. OT provided massage and gently ROM to L UE to decrease tone. Pt then completed stand-pivot to L with Mod A. Pt propelled wc to laundry room with 1 rest break. Py completed sit<>stand with min A at the washing machine. Facilitated weight bearing through L LE while reaching to L to put clothes into top loading washing machine. Pt progressing from min to Max A for standing balance with increased fatigue. Discussed home bathroom set-up and demonstrated tub shower transfer using tub bench. Pt completed stand-pivot to R with min A, then required assistance to lift L LE in and out of tub, while maintaining sitting balance. Stand-pivot to L to return to wc with LOB requiring max A to safely pivot onto wc.  UE NMR standing at raised therapy mat. Provided hand-over hand A to promote weight bearing through L UE while pushing towel. Also facilitated at L foot, knee, and hip to promote LLE weightbearing. Pt returned to room and left seated in wc with daughter present and needs met.  Therapy  Documentation Precautions:  Precautions Precautions: Fall Precaution Comments: L hemiplegia Restrictions Weight Bearing Restrictions: No Pain: Pain Assessment Pain Assessment: No/denies pain  See Function Navigator for Current Functional Status.   Therapy/Group: Individual Therapy  Valma Cava 12/20/2016, 12:10 PM

## 2016-12-20 NOTE — Progress Notes (Signed)
Speech Language Pathology Daily Session Note  Patient Details  Name: Shannon Erickson MRN: [id number] Date of Birth: 03/11/1976  Today's Date: 12/20/2016 SLP Individual Time:  -     Short Term Goals: Week 2: SLP Short Term Goal 1 (Week 2): Pt will use external memory aids to recall new daily information with Min A cues.  SLP Short Term Goal 2 (Week 2): Pt will complete semi-complex problem solving tasks with Min A cues.  SLP Short Term Goal 3 (Week 2): Pt will scan to left of her environment with Min A cues to locate objects during functional tasks.  SLP Short Term Goal 4 (Week 2): Pt will demonstrate anticipatory awareness by listing 3 activities that are safe for her to participate in at discharge with Min A cues.   Skilled Therapeutic Interventions: Skilled ST treatment focused on cognitive goals. SLP facilitated complex problem solving, thought organization and working memory task utilizing novel calendar organization tasks. Pt demonstrated Mod A in problem solving giving complex task and Mod A in working memory. PT required emergent awareness cues to self correct and utilize strategies to check work with Mod A verbal cues. Pt demonstrated the anticipatory awareness ability in identifying 3 activities that are safe to participate in once returning home, Pt lack awareness and named activities that require Mod cues at this point in time. Pt was left in bed with call bell within reach. Recommend to continue ST services.      Function:  Cognition Comprehension Comprehension assist level: Understands complex 90% of the time/cues 10% of the time  Expression   Expression assist level: Expresses complex ideas: With no assist  Social Interaction Social Interaction assist level: Interacts appropriately with others - No medications needed.  Problem Solving Problem solving assist level: Solves basic 50 - 74% of the time/requires cueing 25 - 49% of the time  Memory Memory assist level: Recognizes or  recalls 50 - 74% of the time/requires cueing 25 - 49% of the time    Pain Pain Assessment Pain Assessment: No/denies pain  Therapy/Group: Individual Therapy  Johnnie Moten  [facility] 12/20/2016, 8:37 AM

## 2016-12-21 ENCOUNTER — Inpatient Hospital Stay (HOSPITAL_COMMUNITY): Payer: Self-pay | Admitting: Occupational Therapy

## 2016-12-21 ENCOUNTER — Inpatient Hospital Stay (HOSPITAL_COMMUNITY): Payer: Self-pay

## 2016-12-21 ENCOUNTER — Inpatient Hospital Stay (HOSPITAL_COMMUNITY): Payer: Self-pay | Admitting: Physical Therapy

## 2016-12-21 ENCOUNTER — Inpatient Hospital Stay (HOSPITAL_COMMUNITY): Payer: Self-pay | Admitting: Speech Pathology

## 2016-12-21 LAB — BASIC METABOLIC PANEL
ANION GAP: 8 (ref 5–15)
BUN: 10 mg/dL (ref 6–20)
CO2: 23 mmol/L (ref 22–32)
Calcium: 8.2 mg/dL — ABNORMAL LOW (ref 8.9–10.3)
Chloride: 106 mmol/L (ref 101–111)
Creatinine, Ser: 0.73 mg/dL (ref 0.44–1.00)
Glucose, Bld: 106 mg/dL — ABNORMAL HIGH (ref 65–99)
POTASSIUM: 3.6 mmol/L (ref 3.5–5.1)
SODIUM: 137 mmol/L (ref 135–145)

## 2016-12-21 LAB — CBC
HEMATOCRIT: 36.9 % (ref 36.0–46.0)
HEMOGLOBIN: 12.2 g/dL (ref 12.0–15.0)
MCH: 27.9 pg (ref 26.0–34.0)
MCHC: 33.1 g/dL (ref 30.0–36.0)
MCV: 84.2 fL (ref 78.0–100.0)
Platelets: 292 10*3/uL (ref 150–400)
RBC: 4.38 MIL/uL (ref 3.87–5.11)
RDW: 13.7 % (ref 11.5–15.5)
WBC: 6.4 10*3/uL (ref 4.0–10.5)

## 2016-12-21 NOTE — Progress Notes (Signed)
Occupational Therapy Session Note  Patient Details  Name: Shannon Erickson MRN: [id number] Date of Birth: 06/26/1975  Today's Date: 12/21/2016 OT Individual Time: 1001-1100 OT Individual Time Calculation (min): 59 min    Short Term Goals: Week 2:  OT Short Term Goal 1 (Week 2): Pt will maintain midline during bathing tasks while seated with supervision OT Short Term Goal 2 (Week 2): Pt will demonstrate proficiency with self ROM of LUE with min cues or less. OT Short Term Goal 3 (Week 2): Pt will demonstrate improved LUE body awareness by using R hand to position L arm prior to transfers. OT Short Term Goal 4 (Week 2): Pt will complete LB dressing with overall Mod A using hemi-techniques  Skilled Therapeutic Interventions/Progress Updates:    OT treatment session focused on L NMR, modified bathing/dressing, toileting, and standing balance. Pt reports upset stomach today. Stand-pivot to toilet with Mod A and assistance for clothing management. Pt with small loose BM. Worked on toileting with improved postural control while hiking hip to complete peri-care. Continued working on use of L UE as stabilizer during BADL tasks. Pt needed assist to orient shirt and min cues to recall hemi techniques to don shirt. Pt with increased LLE spasticity making LB dressing more difficult as pt unable to achieve figure 4 position without assistance. Pt tolerated standing for 5 mins at the sink, with incorporated L UE and LLE weight bearing with support at L knee and elbow while pt brushed teeth. Pt returned to wc at end of session and left with daughter present and needs met.   Therapy Documentation Precautions:  Precautions Precautions: Fall Precaution Comments: L hemiplegia Restrictions Weight Bearing Restrictions: Yes Pain:  none/denies pain  See Function Navigator for Current Functional Status.   Therapy/Group: Individual Therapy  Elisabeth S Doe 12/21/2016, 10:22 AM  

## 2016-12-21 NOTE — Progress Notes (Signed)
Speech Language Pathology Daily Session Note  Patient Details  Name: Shannon Erickson MRN: [id number] Date of Birth: 03/11/1976  Today's Date: 12/21/2016 SLP Individual Time: [id number] SLP Individual Time Calculation (min): 25 min  Short Term Goals: Week 2: SLP Short Term Goal 1 (Week 2): Pt will use external memory aids to recall new daily information with Min A cues.  SLP Short Term Goal 2 (Week 2): Pt will complete semi-complex problem solving tasks with Min A cues.  SLP Short Term Goal 3 (Week 2): Pt will scan to left of her environment with Min A cues to locate objects during functional tasks.  SLP Short Term Goal 4 (Week 2): Pt will demonstrate anticipatory awareness by listing 3 activities that are safe for her to participate in at discharge with Min A cues.   Skilled Therapeutic Interventions:  Pt was seen for skilled ST targeting cognitive goals.  Pt needed min assist question cues to recall daily information from her previous therapy sessions.  Pt needed mod assist question cues for anticipatory awareness of how her current limitations will affect her independence in the home environment and reports that her cognition is "normal."  Pt was left in bed with bed alarm set and call bell within reach.  Daughter was at bedside.  Continue per current plan of care.    Function:  Eating Eating                 Cognition Comprehension Comprehension assist level: Understands complex 90% of the time/cues 10% of the time  Expression   Expression assist level: Expresses complex ideas: With no assist  Social Interaction Social Interaction assist level: Interacts appropriately 90% of the time - Needs monitoring or encouragement for participation or interaction.  Problem Solving Problem solving assist level: Solves basic 50 - 74% of the time/requires cueing 25 - 49% of the time  Memory Memory assist level: Recognizes or recalls 75 - 89% of the time/requires cueing 10 - 24% of the time     Pain Pain Assessment Pain Assessment: No/denies pain  Therapy/Group: Individual Therapy  Miriana Gaertner, Melanee SpryNicole L 12/21/2016, 1:34 PM

## 2016-12-21 NOTE — Progress Notes (Signed)
Physical Therapy Session Note  Patient Details  Name: Shannon Erickson Zimbelman MRN: [id number] Date of Birth: 03/25/1976  Today's Date: 12/21/2016 PT Individual Time: 0800-0900 PT Individual Time Calculation (min): 60 min   Short Term Goals: Week 2:  PT Short Term Goal 1 (Week 2): Pt will ambulate 650' with LRAD and +1 assist PT Short Term Goal 2 (Week 2): Pt will demonstrate stand/pivot transfer with min assist PT Short Term Goal 3 (Week 2): Pt will negotiate 4 steps with 1 rail and max assist  Skilled Therapeutic Interventions/Progress Updates: Pt presented in bed agreeable to therapy. PTA donned pants maxA for time management. PTA threading pants and pt able to perform partial bridge to facilitate pulling up pants. Performed supine to sit on R side of bed due to dgt still sleeping with modA. Stand pivot to L with pt holding onto PTA modmaxA. Pt propelled ro rehab gym using hemi-technique with supervision with decreased speed. Performed sit to stand with visual feedback from w/c x 4 with PTA providing maxA for LLE placement and facilitating knee extension due to increased tone. Pt requiring multimodal cues for wt shifting and increased LLE due to increased tone. Pt performed stand pivot to mat in same manner as prior and performed standing balance x 3 with HW. [facility] and performed 3 two min cycles at 70cm/sec. Pt returned to room and remained in w/c with QRB placed and call bell within reach.      Therapy Documentation Precautions:  Precautions Precautions: Fall Precaution Comments: L hemiplegia Restrictions Weight Bearing Restrictions: Yes General:   Vital Signs:   Pain: Pain Assessment Pain Assessment: No/denies pain   See Function Navigator for Current Functional Status.   Therapy/Group: Individual Therapy  Arriel Victor  Blaze Sandin, PTA  12/21/2016, 1:58 PM

## 2016-12-21 NOTE — Progress Notes (Signed)
Physical Therapy Session Note  Patient Details  Name: Shannon Erickson MRN: [id number] Date of Birth: 10/21/1975  Today's Date: 12/21/2016 PT Individual Time: 1130-1200 PT Individual Time Calculation (min): 30 min   Short Term Goals: Week 2:  PT Short Term Goal 1 (Week 2): Pt will ambulate 3650' with LRAD and +1 assist PT Short Term Goal 2 (Week 2): Pt will demonstrate stand/pivot transfer with min assist PT Short Term Goal 3 (Week 2): Pt will negotiate 4 steps with 1 rail and max assist  Skilled Therapeutic Interventions/Progress Updates:    no c/o pain. PT treatment session focused on NMR via proprioceptive input to the L UE during sitting tasks.  Pt sitting in w/c upon arrival with daughter present, pt agreeable to PT. PT rolled pt in w/c to/from therapy gym for time and energy conservation. Squat pivot transfer to L w/c to mat with close supervision for safety. Sitting reaching task focused on sitting balance with R trunk elongation, cross body reaching, and L UE weight bearing on forearm. PT provided positioning and scapular stability for L UE weight bearing with min assist for sitting balance with verbal and tactile cues for forward trunk weigh shift. Sitting task with L lateral lean for weight bearing on L UE forearm during R UE cross body reaching task with PT providing facilitation for scapular stability. Pt squat pivot transfer to L mat w/c with min assist for balance. Squat pivot w/c to bed with min assist from daughter for pivoting. Sit to supine with steadying assist. Pt left supine in bed with daughter present and call bell in reach.   Therapy Documentation Precautions:  Precautions Precautions: Fall Precaution Comments: L hemiplegia Restrictions Weight Bearing Restrictions: Yes   See Function Navigator for Current Functional Status.   Therapy/Group: Individual Therapy  [name] 12/21/2016, 12:17 PM

## 2016-12-21 NOTE — Progress Notes (Signed)
Subjective/Complaints: Pt wears diaper at night but states she voids during the day No pain c/os  ROS- Denies CP, SOB, N/V/D   Objective: Vital Signs: Blood pressure 100/62, pulse 77, temperature 98.9 F (37.2 C), temperature source Oral, resp. rate 16, height 5' 3"  (1.6 m), weight 85.2 kg (187 lb 14.5 oz), SpO2 100 %. No results found. Results for orders placed or performed during the hospital encounter of 12/10/16 (from the past 72 hour(s))  Basic metabolic panel     Status: Abnormal   Collection Time: 12/21/16  5:27 AM  Result Value Ref Range   Sodium 137 135 - 145 mmol/L   Potassium 3.6 3.5 - 5.1 mmol/L   Chloride 106 101 - 111 mmol/L   CO2 23 22 - 32 mmol/L   Glucose, Bld 106 (H) 65 - 99 mg/dL   BUN 10 6 - 20 mg/dL   Creatinine, Ser 0.73 0.44 - 1.00 mg/dL   Calcium 8.2 (L) 8.9 - 10.3 mg/dL   GFR calc non Af Amer >60 >60 mL/min   GFR calc Af Amer >60 >60 mL/min    Comment: (NOTE) The eGFR has been calculated using the CKD EPI equation. This calculation has not been validated in all clinical situations. eGFR's persistently <60 mL/min signify possible Chronic Kidney Disease.    Anion gap 8 5 - 15  CBC     Status: None   Collection Time: 12/21/16  5:27 AM  Result Value Ref Range   WBC 6.4 4.0 - 10.5 K/uL   RBC 4.38 3.87 - 5.11 MIL/uL   Hemoglobin 12.2 12.0 - 15.0 g/dL   HCT 36.9 36.0 - 46.0 %   MCV 84.2 78.0 - 100.0 fL   MCH 27.9 26.0 - 34.0 pg   MCHC 33.1 30.0 - 36.0 g/dL   RDW 13.7 11.5 - 15.5 %   Platelets 292 150 - 400 K/uL     HEENT: Normocephalic, atraumatic Cardio: RRR and no JVD Resp: CTA B/L and unlabored GI: BS positive and ND Skin:   Intact. Warm and dry Neuro: Lethargic,  Motor: 4-/5 Left HF KE, 0/5 at ankle  0/5 in LUE with spastic tone throughout Musc/Skel:  No edema, no tenderness Gen NAD. Vital signs reivewed  Assessment/Plan: 1. Functional deficits secondary to Right MCA infarct which require 3+ hours per day of interdisciplinary therapy  in a comprehensive inpatient rehab setting. Physiatrist is providing close team supervision and 24 hour management of active medical problems listed below. Physiatrist and rehab team continue to assess barriers to discharge/monitor patient progress toward functional and medical goals. FIM: Function - Bathing Position: Shower Body parts bathed by patient: Left arm, Chest, Abdomen, Front perineal area, Right upper leg, Left upper leg Body parts bathed by helper: Right arm, Buttocks, Right lower leg, Back, Left lower leg Assist Level: Touching or steadying assistance(Pt > 75%)  Function- Upper Body Dressing/Undressing What is the patient wearing?: Pull over shirt/dress Pull over shirt/dress - Perfomed by patient: Thread/unthread right sleeve, Put head through opening Pull over shirt/dress - Perfomed by helper: Thread/unthread left sleeve, Pull shirt over trunk Assist Level: Touching or steadying assistance(Pt > 75%) Function - Lower Body Dressing/Undressing What is the patient wearing?: Pants, Non-skid slipper socks Position: Sitting EOB Underwear - Performed by patient: Thread/unthread right underwear leg Underwear - Performed by helper: Thread/unthread right underwear leg, Thread/unthread left underwear leg, Pull underwear up/down Pants- Performed by patient: Thread/unthread right pants leg Pants- Performed by helper: Thread/unthread left pants leg, Pull pants up/down  Non-skid slipper socks- Performed by helper: Don/doff right sock, Don/doff left sock Socks - Performed by patient: Don/doff right sock Socks - Performed by helper: Don/doff right sock, Don/doff left sock Shoes - Performed by patient: Don/doff right shoe Shoes - Performed by helper: Don/doff left shoe Assist for footwear: Partial/moderate assist Assist for lower body dressing: Touching or steadying assistance (Pt > 75%)  Function - Toileting Toileting activity did not occur: No continent bowel/bladder event Toileting steps  completed by patient: Performs perineal hygiene Toileting steps completed by helper: Performs perineal hygiene, Adjust clothing after toileting, Adjust clothing prior to toileting Toileting Assistive Devices: Grab bar or rail Assist level: Touching or steadying assistance (Pt.75%)  Function - Air cabin crew transfer assistive device: Grab bar Assist level to toilet: Moderate assist (Pt 50 - 74%/lift or lower) Assist level from toilet: Moderate assist (Pt 50 - 74%/lift or lower)  Function - Chair/bed transfer Chair/bed transfer method: Squat pivot Chair/bed transfer assist level: Touching or steadying assistance (Pt > 75%) Chair/bed transfer assistive device: Armrests Chair/bed transfer details: Tactile cues for weight shifting, Tactile cues for sequencing, Verbal cues for sequencing, Verbal cues for technique  Function - Locomotion: Wheelchair Will patient use wheelchair at discharge?:  (TBD) Type: Manual Max wheelchair distance: 147f Assist Level: Supervision or verbal cues Assist Level: Supervision or verbal cues Assist Level: Supervision or verbal cues Turns around,maneuvers to table,bed, and toilet,negotiates 3% grade,maneuvers on rugs and over doorsills: No Function - Locomotion: Ambulation Assistive device: Rail in hallway, [employer] distance: 75 Assist level: Maximal assist (Pt 25 - 49%) Assist level: Maximal assist (Pt 25 - 49%) Walk 50 feet with 2 turns activity did not occur: Safety/medical concerns Assist level: Maximal assist (Pt 25 - 49%) Walk 150 feet activity did not occur: Safety/medical concerns Walk 10 feet on uneven surfaces activity did not occur: Safety/medical concerns  Function - Comprehension Comprehension: Auditory Comprehension assist level: Understands complex 90% of the time/cues 10% of the time  Function - Expression Expression: Verbal Expression assist level: Expresses complex ideas: With no assist  Function - Social  Interaction Social Interaction assist level: Interacts appropriately with others - No medications needed.  Function - Problem Solving Problem solving assist level: Solves basic 50 - 74% of the time/requires cueing 25 - 49% of the time  Function - Memory Memory assist level: Recognizes or recalls 50 - 74% of the time/requires cueing 25 - 49% of the time Patient normally able to recall (first 3 days only): Current season, That he or she is in a hospital, Staff names and faces, Location of own room   Medical Problem List and Plan: 1. Left hemiparesis secondary to Right MCA infarct  ASA and Plavix  Cont CIR   PT,OT, SLP- Team conference today please see physician documentation under team conference tab, met with team face-to-face to discuss problems,progress, and goals. Formulized individual treatment plan based on medical history, underlying problem and comorbidities. 2. DVT Prophylaxis/Anticoagulation: Pharmaceutical: Lovenox   Plt 292K on 7/31 3. Migraine Headaches/Pain Management: has been getting Toradol since 7/14. On low dose topamax--will change to nights to help with sedation and may need titrated upwards. Sensory dysesthesia LLE did not occur last noc will monitor 4. Mood: LCSW to follow for evaluation and support.  5. Neuropsych: This patient is not fullycapable of making decisions on herown behalf. 6. Skin/Wound Care: routine pressure relief measures  7. Fluids/Electrolytes/Nutrition: Liquids advanced to thins Monitor I/O.  Low alb will supplement with prostat  BMP within acceptable  range on 7/31   Protein supplement changed due to pt preference 8. Dyslipidemia: On Lipitor. 9. Thyroid nodule: follow up on outpatient basis 10. Obesity: Educate on importance of weight loss with appropriate diet to promote health and mobility.  11. Question antiphospholipid syndrome: high Beta 2-glycoprotein IgG. Will need recheck labs in 12 weeks. 12.  Spasticity LUE  resting hand splint    Cont tizanidine 8m qhs, no spasm which disturb sleep 13.  Constipation  Started senna 2 po BID 14.  Rhinitis - improved will d/c   claritin 15.  Urinary incont- cont toileting program- afebrile without dysuria, WBC normal 7/31 LOS (Days) 11 A FACE TO FACE EVALUATION WAS PERFORMED  KIRSTEINS,ANDREW E 12/21/2016, 6:58 AM

## 2016-12-21 NOTE — Progress Notes (Signed)
Speech Language Pathology Daily Session Note  Patient Details  Name: Shannon Erickson MRN: [id number] Date of Birth: 04-Nov-1975  Today's Date: 12/21/2016 SLP Individual Time: 1445-1500 SLP Individual Time Calculation (min): 15 min  Short Term Goals: Week 1: SLP Short Term Goal 1 (Week 1): Pt will consume regular diet textures without overt s/s of aspiration and Mod I use of compensatory swallow strategies.  SLP Short Term Goal 1 - Progress (Week 1): Met SLP Short Term Goal 2 (Week 1): Pt will increase use speech intelligibility strategies to increase vocal intensity to achieve ~ 80% intelligibility at the sentence level. with Mod A cues. SLP Short Term Goal 2 - Progress (Week 1): Met SLP Short Term Goal 3 (Week 1): Pt will use external memory aids to recall new daily information with Mod A cues.  SLP Short Term Goal 3 - Progress (Week 1): Met SLP Short Term Goal 4 (Week 1): Pt will complete semi-complex problem solving tasks with Mod A cues.  SLP Short Term Goal 4 - Progress (Week 1): Met SLP Short Term Goal 5 (Week 1): Pt will scan to left of her environment with Mod A cues to locate objects during functional tasks.  SLP Short Term Goal 5 - Progress (Week 1): Met Week 2: SLP Short Term Goal 1 (Week 2): Pt will use external memory aids to recall new daily information with Min A cues.  SLP Short Term Goal 2 (Week 2): Pt will complete semi-complex problem solving tasks with Min A cues.  SLP Short Term Goal 3 (Week 2): Pt will scan to left of her environment with Min A cues to locate objects during functional tasks.  SLP Short Term Goal 4 (Week 2): Pt will demonstrate anticipatory awareness by listing 3 activities that are safe for her to participate in at discharge with Min A cues.   Skilled Therapeutic Interventions: Skilled ST services focused on cognitive goals. Pt was in the toliteing, reported feeling ill for the first 15 minutes of the session. SLP facilitated recall of yesterday's  therapy events requiring initial Min A verbal cue.  SLP educated pt on visualization strategies to aid in recall. Pt demonstrated anticipatory awareness for safe tasks to complete at home with Mod A verbal. Pt was left in chair in room with daughter with call bell within reach. Recommend to continue ST services.     Function:  Cognition Comprehension Comprehension assist level: Understands complex 90% of the time/cues 10% of the time  Expression   Expression assist level: Expresses complex ideas: With no assist  Social Interaction Social Interaction assist level: Interacts appropriately 90% of the time - Needs monitoring or encouragement for participation or interaction.  Problem Solving Problem solving assist level: Solves basic 50 - 74% of the time/requires cueing 25 - 49% of the time  Memory Memory assist level: Recognizes or recalls 75 - 89% of the time/requires cueing 10 - 24% of the time    Pain Pain Assessment Pain Assessment: No/denies pain Pain Score: 1  Faces Pain Scale: No hurt Pain Type: Other (Comment) Pain Location: Head Pain Orientation: Anterior Pain Descriptors / Indicators: Spasm Pain Onset: Gradual Pain Intervention(s): Medication (See eMAR) Multiple Pain Sites: No PAINAD (Pain Assessment in Advanced Dementia) Breathing: normal Facial Expression: smiling or inexpressive  Therapy/Group: Individual Therapy  Shannon Erickson  [facility] 12/21/2016, 5:02 PM

## 2016-12-22 ENCOUNTER — Encounter (HOSPITAL_COMMUNITY): Payer: Self-pay

## 2016-12-22 ENCOUNTER — Inpatient Hospital Stay (HOSPITAL_COMMUNITY): Payer: Medicaid Other | Admitting: Occupational Therapy

## 2016-12-22 ENCOUNTER — Inpatient Hospital Stay (HOSPITAL_COMMUNITY): Payer: Self-pay | Admitting: Physical Therapy

## 2016-12-22 ENCOUNTER — Inpatient Hospital Stay (HOSPITAL_COMMUNITY): Payer: Self-pay | Admitting: Occupational Therapy

## 2016-12-22 ENCOUNTER — Inpatient Hospital Stay (HOSPITAL_COMMUNITY): Payer: Self-pay | Admitting: Speech Pathology

## 2016-12-22 LAB — ALPHA GALACTOSIDASE: Alpha galactosidase, serum: 13.2 nmol/hr/mg prt — ABNORMAL LOW (ref 28.0–80.0)

## 2016-12-22 NOTE — Patient Care Conference (Signed)
Inpatient RehabilitationTeam Conference and Plan of Care Update Date: 12/21/2016   Time: 11:00 AM    Patient Name: Shannon Erickson      Medical Record Number: [id number]  Date of Birth: 1975/11/01 Sex: Female         Room/Bed: 4M04C/4M04C-01 Payor Info: Payor: MEDICAID POTENTIAL / Plan: MEDICAID POTENTIAL / Product Type: *No Product type* /    Admitting Diagnosis: Right CVA  Admit Date/Time:  12/10/2016  4:39 PM Admission Comments: No comment available   Primary Diagnosis:  Hemiparesis affecting left side as late effect of cerebrovascular accident [facility]) Principal Problem: Hemiparesis affecting left side as late effect of cerebrovascular accident [facility])  Patient Active Problem List   Diagnosis Date Noted  . Spastic hemiparesis ([place])   . Acute rhinitis   . Slow transit constipation   . Stroke due to occlusion of right middle cerebral artery ([place]) 12/10/2016  . Gait disturbance, post-stroke 12/10/2016  . Hemiparesis affecting left side as late effect of cerebrovascular accident ([place])   . Left-sided visual neglect   . Antiphospholipid syndrome ([place])   . Pain   . Prediabetes   . Dysphagia, post-stroke   . Carotid atherosclerosis, bilateral   . Thyroid nodule   . Hyperlipidemia   . Chronic migraine without aura without status migrainosus, not intractable   . Headache 12/03/2016  . Hypokalemia 12/03/2016  . Stroke (cerebrum) ([place]) 12/03/2016  . Left-sided weakness     Expected Discharge Date: Expected Discharge Date: 01/14/17  Team Members Present: Physician leading conference: Dr. Alysia Penna Social Worker Present: Alfonse Alpers, LCSW Nurse Present: Arelia Sneddon, RN PT Present: Dwyane Dee, PT OT Present: Cherylynn Ridges, OT SLP Present: Weston Anna, SLP PPS Coordinator present : Daiva Nakayama, RN, CRRN     Current Status/Progress Goal Weekly Team Focus  Medical   still incont, no further syncope, diarrhea  manage spasticty multi modal approach  monitor  mental status with antispasticity meds   Bowel/Bladder   cont of b&b; lbm 7/30  maintain continence during rehab stay  assess for changes in continence   Swallow/Nutrition/ Hydration   Regular textures with thin liquids, Mod I   Mod I  Goals Met    ADL's   more consistent mod A transfers, mod A bathing/dressing toileting  Min A/Supervision   L NMR, pt.family  education, modified bathing/dressing   Mobility   min-mod A bed mobility, min A transfers, max A ambulation, supervision w/c mobility  supervision bed mobility and transfers, min A ambulation in home and stairs, supervision w/c mobility  L LE NMR, dynamic standing balance, midline orientation in standing, pre-gait, and ambulation    Communication   Mod I  Supervision at the simple conversation level  Goals Met   Safety/Cognition/ Behavioral Observations  Min-Mod A   Supervision  awareness, problem solving, memory    Pain   denies pain; zanaflex for muscle spasms         Skin   ecchymosis to knee  skin free of infection or breakdown duirng rehab stay  assess skin q shift and prn    Rehab Goals Patient on target to meet rehab goals: Yes Rehab Goals Revised: none *See Care Plan and progress notes for long and short-term goals.     Barriers to Discharge  Current Status/Progress Possible Resolutions Date Resolved   Physician    Incontinence;Home environment access/layout  establish new residence  cont [place] re education  consider affect vs cognition in terms of communication  Nursing  Medical stability               PT     not sure where pt will d/c to, LLE tone              OT                  SLP                SW                Discharge Planning/Teaching Needs:  Pt plans to go home with her 41 y/o dtr and 47 y/o son who will be with her to provide assistance.  Dtr is prepared to stay in [place] with pt for up to a year, if need be.  She is looking for new place for pt to live.  Pt's family will be offered family  education closer to d/c, but dtr is present for therapies daily.   Team Discussion:  Pt is incontinent of bowel and Dr. Letta Pate to decrease her senna to see if this helps.  Pt can be continent of bladder, but does have some urgency.  Dr. Letta Pate is considering some p.o.meds for pt's tone, if they do not sedate her.  Pt with flat affect which makes it difficult for staff to know what information pt is taking in and holding onto.  Dtr has been checked off by PT to tx pt from bed to chair.  Pt's UE has more tone.  Pt is doing well with ADLs and training with dtr has started by OT.  ST stated that pt is mod A with cognition and is on a regular diet.  Revisions to Treatment Plan:  none    Continued Need for Acute Rehabilitation Level of Care: The patient requires daily medical management by a physician with specialized training in physical medicine and rehabilitation for the following conditions: Daily direction of a multidisciplinary physical rehabilitation program to ensure safe treatment while eliciting the highest outcome that is of practical value to the patient.: Yes Daily analysis of laboratory values and/or radiology reports with any subsequent need for medication adjustment of medical intervention for : Neurological problems;Mood/behavior problems;Urological problems  Matai Carpenito, [name] 12/22/2016, 11:19 AM

## 2016-12-22 NOTE — Plan of Care (Signed)
Problem: RH SKIN INTEGRITY Goal: RH STG MAINTAIN SKIN INTEGRITY WITH ASSISTANCE STG Maintain Skin Integrity With min Assistance.   Outcome: Progressing No skin issues  Problem: RH SAFETY Goal: RH STG ADHERE TO SAFETY PRECAUTIONS W/ASSISTANCE/DEVICE STG Adhere to Safety Precautions With  Min Assistance/Device.   Outcome: Progressing Safety precautions maintained, no fall or injury  Problem: RH PAIN MANAGEMENT Goal: RH STG PAIN MANAGED AT OR BELOW PT'S PAIN GOAL Outcome: Progressing Medicated once for headache with full relief

## 2016-12-22 NOTE — Progress Notes (Signed)
Speech Language Pathology Daily Session Note  Patient Details  Name: Shannon Erickson MRN: [id number] Date of Birth: 01/09/1976  Today's Date: 12/22/2016 SLP Individual Time: [id number] SLP Individual Time Calculation (min): 45 min  Short Term Goals: Week 2: SLP Short Term Goal 1 (Week 2): Pt will use external memory aids to recall new daily information with Min A cues.  SLP Short Term Goal 2 (Week 2): Pt will complete semi-complex problem solving tasks with Min A cues.  SLP Short Term Goal 3 (Week 2): Pt will scan to left of her environment with Min A cues to locate objects during functional tasks.  SLP Short Term Goal 4 (Week 2): Pt will demonstrate anticipatory awareness by listing 3 activities that are safe for her to participate in at discharge with Min A cues.   Skilled Therapeutic Interventions:Skilled treatment session focused on cognitive goals. SLP facilitated session by providing Max A verbal cues for recall of her current medications and their functions and Min A verbal cues for problem solving during a mildly complex task of organization a BID pill box. Patient left upright in bed with all needs within reach and alarm on. Continue with current plan of care.     Function:    Cognition Comprehension Comprehension assist level: Understands complex 90% of the time/cues 10% of the time  Expression   Expression assist level: Expresses complex ideas: With no assist  Social Interaction Social Interaction assist level: Interacts appropriately 90% of the time - Needs monitoring or encouragement for participation or interaction.  Problem Solving Problem solving assist level: Solves basic 75 - 89% of the time/requires cueing 10 - 24% of the time  Memory Memory assist level: Recognizes or recalls 75 - 89% of the time/requires cueing 10 - 24% of the time    Pain No/Denies Pain   Therapy/Group: Individual Therapy  Shannon Erickson 12/22/2016, 11:23 AM

## 2016-12-22 NOTE — Progress Notes (Signed)
Subjective/Complaints:  No issues overnite, spasms are better  ROS- Denies CP, SOB, N/V/D   Objective: Vital Signs: Blood pressure (!) 98/48, pulse 84, temperature 98.1 F (36.7 C), temperature source Oral, resp. rate 17, height 5' 3" (1.6 m), weight 85.2 kg (187 lb 14.5 oz), SpO2 98 %. No results found. Results for orders placed or performed during the hospital encounter of 12/10/16 (from the past 72 hour(s))  Basic metabolic panel     Status: Abnormal   Collection Time: 12/21/16  5:27 AM  Result Value Ref Range   Sodium 137 135 - 145 mmol/L   Potassium 3.6 3.5 - 5.1 mmol/L   Chloride 106 101 - 111 mmol/L   CO2 23 22 - 32 mmol/L   Glucose, Bld 106 (H) 65 - 99 mg/dL   BUN 10 6 - 20 mg/dL   Creatinine, Ser 0.73 0.44 - 1.00 mg/dL   Calcium 8.2 (L) 8.9 - 10.3 mg/dL   GFR calc non Af Amer >60 >60 mL/min   GFR calc Af Amer >60 >60 mL/min    Comment: (NOTE) The eGFR has been calculated using the CKD EPI equation. This calculation has not been validated in all clinical situations. eGFR's persistently <60 mL/min signify possible Chronic Kidney Disease.    Anion gap 8 5 - 15  CBC     Status: None   Collection Time: 12/21/16  5:27 AM  Result Value Ref Range   WBC 6.4 4.0 - 10.5 K/uL   RBC 4.38 3.87 - 5.11 MIL/uL   Hemoglobin 12.2 12.0 - 15.0 g/dL   HCT 36.9 36.0 - 46.0 %   MCV 84.2 78.0 - 100.0 fL   MCH 27.9 26.0 - 34.0 pg   MCHC 33.1 30.0 - 36.0 g/dL   RDW 13.7 11.5 - 15.5 %   Platelets 292 150 - 400 K/uL     HEENT: Normocephalic, atraumatic Cardio: RRR and no JVD Resp: CTA B/L and unlabored GI: BS positive and ND Skin:   Intact. Warm and dry Neuro: Lethargic,  Motor: 4-/5 Left HF KE, 0/5 at ankle , tone MAS 2 at Left finger flexors 1 at elbow flexors 0/5 in LUE with spastic tone throughout Musc/Skel:  No edema, no tenderness Gen NAD. Vital signs reivewed  Assessment/Plan: 1. Functional deficits secondary to Right MCA infarct which require 3+ hours per day of  interdisciplinary therapy in a comprehensive inpatient rehab setting. Physiatrist is providing close team supervision and 24 hour management of active medical problems listed below. Physiatrist and rehab team continue to assess barriers to discharge/monitor patient progress toward functional and medical goals. FIM: Function - Bathing Position: Shower Body parts bathed by patient: Left arm, Chest, Abdomen, Front perineal area, Right upper leg, Left upper leg Body parts bathed by helper: Right arm, Buttocks, Right lower leg, Back, Left lower leg Assist Level: Touching or steadying assistance(Pt > 75%)  Function- Upper Body Dressing/Undressing What is the patient wearing?: Pull over shirt/dress Pull over shirt/dress - Perfomed by patient: Thread/unthread right sleeve, Put head through opening Pull over shirt/dress - Perfomed by helper: Thread/unthread left sleeve, Pull shirt over trunk Assist Level: Touching or steadying assistance(Pt > 75%) Function - Lower Body Dressing/Undressing What is the patient wearing?: Pants, Non-skid slipper socks Position: Sitting EOB Underwear - Performed by patient: Thread/unthread right underwear leg Underwear - Performed by helper: Thread/unthread right underwear leg, Thread/unthread left underwear leg, Pull underwear up/down Pants- Performed by patient: Thread/unthread right pants leg Pants- Performed by helper: Thread/unthread left pants  leg, Pull pants up/down Non-skid slipper socks- Performed by helper: Don/doff right sock, Don/doff left sock Socks - Performed by patient: Don/doff right sock Socks - Performed by helper: Don/doff right sock, Don/doff left sock Shoes - Performed by patient: Don/doff right shoe Shoes - Performed by helper: Don/doff left shoe Assist for footwear: Partial/moderate assist Assist for lower body dressing: Touching or steadying assistance (Pt > 75%)  Function - Toileting Toileting activity did not occur: No continent  bowel/bladder event Toileting steps completed by patient: Performs perineal hygiene Toileting steps completed by helper: Performs perineal hygiene, Adjust clothing after toileting, Adjust clothing prior to toileting Toileting Assistive Devices: Grab bar or rail Assist level: Touching or steadying assistance (Pt.75%)  Function - Air cabin crew transfer assistive device: Grab bar Assist level to toilet: Moderate assist (Pt 50 - 74%/lift or lower) Assist level from toilet: Moderate assist (Pt 50 - 74%/lift or lower)  Function - Chair/bed transfer Chair/bed transfer method: Squat pivot Chair/bed transfer assist level: Touching or steadying assistance (Pt > 75%) Chair/bed transfer assistive device: Armrests Chair/bed transfer details: Verbal cues for safe use of DME/AE, Tactile cues for weight shifting, Verbal cues for sequencing  Function - Locomotion: Wheelchair Will patient use wheelchair at discharge?:  (TBD) Type: Manual Max wheelchair distance: 151f Assist Level: Supervision or verbal cues Assist Level: Supervision or verbal cues Assist Level: Supervision or verbal cues Turns around,maneuvers to table,bed, and toilet,negotiates 3% grade,maneuvers on rugs and over doorsills: No Function - Locomotion: Ambulation Assistive device: Rail in hallway, [employer] Net[employer] distance: 75 Assist level: Maximal assist (Pt 25 - 49%) Assist level: Maximal assist (Pt 25 - 49%) Walk 50 feet with 2 turns activity did not occur: Safety/medical concerns Assist level: Maximal assist (Pt 25 - 49%) Walk 150 feet activity did not occur: Safety/medical concerns Walk 10 feet on uneven surfaces activity did not occur: Safety/medical concerns  Function - Comprehension Comprehension: Auditory Comprehension assist level: Understands complex 90% of the time/cues 10% of the time  Function - Expression Expression: Verbal Expression assist level: Expresses complex ideas: With no assist  Function -  Social Interaction Social Interaction assist level: Interacts appropriately 90% of the time - Needs monitoring or encouragement for participation or interaction.  Function - Problem Solving Problem solving assist level: Solves basic 50 - 74% of the time/requires cueing 25 - 49% of the time  Function - Memory Memory assist level: Recognizes or recalls 75 - 89% of the time/requires cueing 10 - 24% of the time Patient normally able to recall (first 3 days only): Current season, That he or she is in a hospital, Staff names and faces, Location of own room   Medical Problem List and Plan: 1. Left hemiparesis secondary to Right MCA infarct  ASA and Plavix  Cont CIR   PT,OT, SLP- 2. DVT Prophylaxis/Anticoagulation: Pharmaceutical: Lovenox   Plt 292K on 7/31 3. Migraine Headaches/Pain Management: has been getting Toradol since 7/14. On low dose topamax--will change to nights to help with sedation and may need titrated upwards. Sensory dysesthesia LLE did not occur last noc will monitor 4. Mood: LCSW to follow for evaluation and support.  5. Neuropsych: This patient is not fullycapable of making decisions on herown behalf. 6. Skin/Wound Care: routine pressure relief measures  7. Fluids/Electrolytes/Nutrition: Liquids advanced to thins Monitor I/O.  Low alb will supplement with prostat  BMP within acceptable range on 7/31   Protein supplement changed due to pt preference 8. Dyslipidemia: On Lipitor. 9. Thyroid nodule: follow up on  outpatient basis 10. Obesity: Educate on importance of weight loss with appropriate diet to promote health and mobility.  11. Question antiphospholipid syndrome: high Beta 2-glycoprotein IgG. Will need recheck labs in 12 weeks. 12.  Spasticity LUE and LLE- improving  resting hand splint   Cont tizanidine 67m qhs, no spasm which disturb sleep 13.  Constipation  Started senna 2 po BID  15.  Urinary incont- cont toileting program- afebrile without dysuria, WBC normal  7/31 LOS (Days) 12 A FACE TO FACE EVALUATION WAS PERFORMED  Abdelrahman Nair E 12/22/2016, 7:49 AM

## 2016-12-22 NOTE — Progress Notes (Signed)
Occupational Therapy Session Note  Patient Details  Name: Shannon Erickson MRN: [id number] Date of Birth: [birth year]  Today's Date: 12/22/2016 OT Individual Time: 0052-5910 OT Individual Time Calculation (min): 31 min   Short Term Goals: Week 2:  OT Short Term Goal 1 (Week 2): Pt will maintain midline during bathing tasks while seated with supervision OT Short Term Goal 2 (Week 2): Pt will demonstrate proficiency with self ROM of LUE with min cues or less. OT Short Term Goal 3 (Week 2): Pt will demonstrate improved LUE body awareness by using R hand to position L arm prior to transfers. OT Short Term Goal 4 (Week 2): Pt will complete LB dressing with overall Mod A using hemi-techniques  Skilled Therapeutic Interventions/Progress Updates:    OT treatment session focused on L NMR and UE NMES. 1:1 NMES applied to wrist extensors on Channel 1 and  supraspinatus and middle deltoid on Channel 2 to help approximate shoulder joint to reduce sublux and reduce pain.  No adverse reactions after treatment and is skin intact.  Pt then worked on active pronation of forearm with therapist positioning L UE into supination. Pt returned to room and left seated in wc with needs met and call bell in reach.   Therapy Documentation Precautions:  Precautions Precautions: Fall Precaution Comments: L hemiplegia Restrictions Weight Bearing Restrictions: No Pain: Pain Assessment Pain Assessment: No/denies pain  See Function Navigator for Current Functional Status.   Therapy/Group: Individual Therapy  Valma Cava 12/22/2016, 3:23 PM

## 2016-12-22 NOTE — Progress Notes (Signed)
Physical Therapy Session Note  Patient Details  Name: Shannon Erickson MRN: [id number] Date of Birth: 04/11/1976  Today's Date: 12/22/2016 PT Individual Time: 1300-1400 PT Individual Time Calculation (min): 60 min   Short Term Goals: Week 2:  PT Short Term Goal 1 (Week 2): Pt will ambulate 2050' with LRAD and +1 assist PT Short Term Goal 2 (Week 2): Pt will demonstrate stand/pivot transfer with min assist PT Short Term Goal 3 (Week 2): Pt will negotiate 4 steps with 1 rail and max assist  Skilled Therapeutic Interventions/Progress Updates:    no c/o pain. PT treatment session focused on L LE NMR during functional activities, orthotics consult, and transfers. Pt supine in bed with daughter and brother present, agreeable to PT treatment session. Supine to sit with HOB elevated using bed rails with supervision for safety. PT propelled pt in w/c to/from gym for energy conservation and time. Sit to stand with min A for steadying throughout session. Standing at rail R LE step forward/back with mod A for balance and L LE knee extension facilitation progressed to R LE step up onto 1 inch step with mod A for balance with facilitation for L LE knee extension. PT rolled pt to room for continent bladder and bowel. Stand pivot transfer w/c to/from toilet with mod A for pivoting and balance. Pt stand with mod A for balance and pt's daughter performed peri-hygiene and clothing management. Orthotics consult for ground reaction force AFO to assist with L knee extension during stance and leather toe cap for L foot swing during gait. Pt ambulated 2735ft at hallway rail wearing L AFO with max A for balance and PT providing facilitation for L LE swing through and L knee extension with stance. Pt returned to room in w/c with call bell in reach and daughter present.   Therapy Documentation Precautions:  Precautions Precautions: Fall Precaution Comments: L hemiplegia Restrictions Weight Bearing Restrictions: No  See  Function Navigator for Current Functional Status.   Therapy/Group: Individual Therapy  Edvin Albus 12/22/2016, 5:16 PM

## 2016-12-22 NOTE — Progress Notes (Signed)
Social Work Patient ID: Laruth Bouchard, female   DOB: December 05, 1975, 41 y.o.   MRN: [id number]   CSW met with pt and her dtr yesterday to update them on team conference discussion and targeted d/c date.  They were pleased that pt is progressing, but happy that she still will have more time on CIR.  Dtr is trying to help pt with financial affairs, a new home, and support her through this stroke recovery.  She stated she is prepared to stay in [place] for a year with her mother, if needed.  They had son's SSN, so CSW gave that to the financial counselor, so her Medicaid application should be complete now.  CSW gave them [facility] information and pt has already started on that paperwork.  Pt is not yet capable of signing legal documents, but she plans to designate her dtr as POA so that she can assist her with her affairs.  CSW to keep pt and dtr updated on her cognitive status.  CSW is available to assist as needed.

## 2016-12-22 NOTE — Progress Notes (Signed)
Orthopedic Tech Progress Note Patient Details:  Shannon DiceCynthia Castronovo 07/28/1975 [id number]  Patient ID: Shannon Diceynthia Erickson, female   DOB: 05/14/1976, 41 y.o.   MRN: [id number]   Shannon Erickson Aston 12/22/2016, 10:22 AMCalled [employer] for AFO brace.

## 2016-12-22 NOTE — Progress Notes (Signed)
Occupational Therapy Session Note  Patient Details  Name: Shannon Erickson MRN: [id number] Date of Birth: Mar 22, 1976  Today's Date: 12/22/2016 OT Individual Time: 0801-0900 OT Individual Time Calculation (min): 59 min    Short Term Goals: Week 2:  OT Short Term Goal 1 (Week 2): Pt will maintain midline during bathing tasks while seated with supervision OT Short Term Goal 2 (Week 2): Pt will demonstrate proficiency with self ROM of LUE with min cues or less. OT Short Term Goal 3 (Week 2): Pt will demonstrate improved LUE body awareness by using R hand to position L arm prior to transfers. OT Short Term Goal 4 (Week 2): Pt will complete LB dressing with overall Mod A using hemi-techniques  Skilled Therapeutic Interventions/Progress Updates:    Pt seen this session for ADL retraining with a focus on postural control in standing and transfer skills. Pt received in bed and transferred to w/c with min A squat pivot then to tub bench with increased assist to fully pivot hips to tub bench.  On bench demonstrated stable sitting balance while bathing with min A.  Used leans to wash bottom versus standing as pt had clonus in LLE. Instructed pt how to push down on base of L thigh to stabilize heel on floor when she begins to have those spasms.  For dressing, placed pt's chair at end of bed with R side at bed rail.  Pt worked on sit to stand with R hand on rail with steadying A.  In standing she was able to pull pants over hips 80% of the way with steadying A for balance.  Worked on tone reduction and NMR with bilateral hands clasped with PNF pattern of shoulder to opposite knee using trunk rotation 12 x to R , 12x to L. LLE wt bearing and NMR with sit to partial stand (squats) pushing up with R hand on arm rest and looking to the R to center balance.  Pt resting in w/c with all needs met, quick release belt on. Pt finishing her breakfast.  Therapy Documentation Precautions:  Precautions Precautions:  Fall Precaution Comments: L hemiplegia Restrictions Weight Bearing Restrictions: No  Pain: Pain Assessment Pain Assessment: No/denies pain ADL:  See Function Navigator for Current Functional Status.   Therapy/Group: Individual Therapy  Yarisbel Miranda 12/22/2016, 11:55 AM

## 2016-12-23 ENCOUNTER — Inpatient Hospital Stay (HOSPITAL_COMMUNITY): Payer: Self-pay | Admitting: Speech Pathology

## 2016-12-23 ENCOUNTER — Inpatient Hospital Stay (HOSPITAL_COMMUNITY): Payer: Medicaid Other | Admitting: Speech Pathology

## 2016-12-23 ENCOUNTER — Inpatient Hospital Stay (HOSPITAL_COMMUNITY): Payer: Self-pay | Admitting: Occupational Therapy

## 2016-12-23 ENCOUNTER — Inpatient Hospital Stay (HOSPITAL_COMMUNITY): Payer: Self-pay | Admitting: Physical Therapy

## 2016-12-23 NOTE — Progress Notes (Signed)
Occupational Therapy Session Note  Patient Details  Name: Shannon Erickson MRN: [id number] Date of Birth: 02/16/1976  Today's Date: 12/23/2016 OT Individual Time: 0900-1000 OT Individual Time Calculation (min): 60 min   Short Term Goals: Week 2:  OT Short Term Goal 1 (Week 2): Pt will maintain midline during bathing tasks while seated with supervision OT Short Term Goal 2 (Week 2): Pt will demonstrate proficiency with self ROM of LUE with min cues or less. OT Short Term Goal 3 (Week 2): Pt will demonstrate improved LUE body awareness by using R hand to position L arm prior to transfers. OT Short Term Goal 4 (Week 2): Pt will complete LB dressing with overall Mod A using hemi-techniques  Skilled Therapeutic Interventions/Progress Updates:    Pt completed bed mobility with min guard A to elevate trunk. Educated pt on massage and ROM techniques to decrease flexor tone-pt demonstrated understanding while seated EOB. Stand-pivot transfers wc>shower chair>WC with Min A overall. Provided pt with wash mit for L hand and facilitated weight bearing to wash upper legs and R UE. Pt with slight increase in shoulder activation today. Min cues to recall hemi-dressing techniques to don shirt with assistance to orient shirt and pull shirt completed over elbow. Pt able to place LLE onto R knee for figure 4 position, but needed assistance to keep LE on knee. Pt then able to thread pant legs. Facilitated LLE weight bearing with support on L knee to maintain knee and hip extension. Pt tolerated ~ 6 mins standing while brushing hair with overall Mod A to maintain standing balance. Also facilitated weight bearing through L UE on sink while standing. Pt left seated in wc at end of session with breakfast set-up and daughter present.   Therapy Documentation Precautions:  Precautions Precautions: Fall Precaution Comments: L hemiplegia Restrictions Weight Bearing Restrictions: Yes Pain: Pain Assessment Pain  Assessment: No/denies pain  See Function Navigator for Current Functional Status.   Therapy/Group: Individual Therapy  Mal Amabilelisabeth S Devra Stare 12/23/2016, 10:01 AM

## 2016-12-23 NOTE — Progress Notes (Signed)
Speech Language Pathology Daily Session Note  Patient Details  Name: Shannon Erickson MRN: [id number] Date of Birth: 11/15/1975  Today's Date: 12/23/2016 SLP Individual Time: 1116-1200 SLP Individual Time Calculation (min): 44 min  Short Term Goals: Week 2: SLP Short Term Goal 1 (Week 2): Pt will use external memory aids to recall new daily information with Min A cues.  SLP Short Term Goal 2 (Week 2): Pt will complete semi-complex problem solving tasks with Min A cues.  SLP Short Term Goal 3 (Week 2): Pt will scan to left of her environment with Min A cues to locate objects during functional tasks.  SLP Short Term Goal 4 (Week 2): Pt will demonstrate anticipatory awareness by listing 3 activities that are safe for her to participate in at discharge with Min A cues.   Skilled Therapeutic Interventions:  Pt was seen for skilled ST targeting cognitive goals.  SLP facilitated the session with a novel task to address visual scanning and problem solving.  Pt needed intermittent supervision level verbal cues to locate targets to the left of midline and was able to complete simple, mental math calculations with intermittent supervision level verbal cues.  SLP increased challenge with a semi-complex deductive reasoning task with pt needing up to min assist verbal cues for task organization and to recognize and correct errors.  Pt was returned to room and left in wheelchair with daughter at bedside.  Continue per current plan of care.    Function:  Eating Eating                 Cognition Comprehension Comprehension assist level: Understands complex 90% of the time/cues 10% of the time  Expression   Expression assist level: Expresses complex ideas: With no assist  Social Interaction Social Interaction assist level: Interacts appropriately 90% of the time - Needs monitoring or encouragement for participation or interaction.  Problem Solving Problem solving assist level: Solves basic 75 - 89% of  the time/requires cueing 10 - 24% of the time  Memory Memory assist level: Recognizes or recalls 75 - 89% of the time/requires cueing 10 - 24% of the time    Pain Pain Assessment Pain Assessment: No/denies pain Pain Score: 0-No pain  Therapy/Group: Individual Therapy  Jehan Bonano, Melanee SpryNicole L 12/23/2016, 12:48 PM

## 2016-12-23 NOTE — Progress Notes (Signed)
Speech Language Pathology Daily Session Note  Patient Details  Name: Shannon Erickson MRN: [id number] Date of Birth: 01/31/1976  Today's Date: 12/23/2016 SLP Individual Time: 1430-1500 SLP Individual Time Calculation (min): 30 min  Short Term Goals: Week 2: SLP Short Term Goal 1 (Week 2): Pt will use external memory aids to recall new daily information with Min A cues.  SLP Short Term Goal 2 (Week 2): Pt will complete semi-complex problem solving tasks with Min A cues.  SLP Short Term Goal 3 (Week 2): Pt will scan to left of her environment with Min A cues to locate objects during functional tasks.  SLP Short Term Goal 4 (Week 2): Pt will demonstrate anticipatory awareness by listing 3 activities that are safe for her to participate in at discharge with Min A cues.   Skilled Therapeutic Interventions: Skilled treatment session focused on cognitive goals. SLP facilitated session by providing supervision verbal cues for problem solving and recall during a mildly complex novel card task. Patient was Mod I for visual scanning throughout task. Patient left upright in wheelchair with daughter present. Continue with current plan of care.      Function:   Cognition Comprehension Comprehension assist level: Understands complex 90% of the time/cues 10% of the time  Expression   Expression assist level: Expresses complex ideas: With no assist  Social Interaction Social Interaction assist level: Interacts appropriately 90% of the time - Needs monitoring or encouragement for participation or interaction.  Problem Solving Problem solving assist level: Solves complex 90% of the time/cues < 10% of the time  Memory Memory assist level: Recognizes or recalls 90% of the time/requires cueing < 10% of the time    Pain Pain Assessment Pain Assessment: No/denies pain  Therapy/Group: Individual Therapy  Apryle Stowell 12/23/2016, 4:06 PM

## 2016-12-23 NOTE — Progress Notes (Signed)
Subjective/Complaints:  No issues overnite, denies pain, states sys BP tends to run a bit low at night  ROS- Denies CP, SOB, N/V/D   Objective: Vital Signs: Blood pressure (!) 91/48, pulse 74, temperature 98 F (36.7 C), temperature source Oral, resp. rate 18, height 5' 3"  (1.6 m), weight 85.2 kg (187 lb 14.5 oz), SpO2 98 %. No results found. Results for orders placed or performed during the hospital encounter of 12/10/16 (from the past 72 hour(s))  Basic metabolic panel     Status: Abnormal   Collection Time: 12/21/16  5:27 AM  Result Value Ref Range   Sodium 137 135 - 145 mmol/L   Potassium 3.6 3.5 - 5.1 mmol/L   Chloride 106 101 - 111 mmol/L   CO2 23 22 - 32 mmol/L   Glucose, Bld 106 (H) 65 - 99 mg/dL   BUN 10 6 - 20 mg/dL   Creatinine, Ser 0.73 0.44 - 1.00 mg/dL   Calcium 8.2 (L) 8.9 - 10.3 mg/dL   GFR calc non Af Amer >60 >60 mL/min   GFR calc Af Amer >60 >60 mL/min    Comment: (NOTE) The eGFR has been calculated using the CKD EPI equation. This calculation has not been validated in all clinical situations. eGFR's persistently <60 mL/min signify possible Chronic Kidney Disease.    Anion gap 8 5 - 15  CBC     Status: None   Collection Time: 12/21/16  5:27 AM  Result Value Ref Range   WBC 6.4 4.0 - 10.5 K/uL   RBC 4.38 3.87 - 5.11 MIL/uL   Hemoglobin 12.2 12.0 - 15.0 g/dL   HCT 36.9 36.0 - 46.0 %   MCV 84.2 78.0 - 100.0 fL   MCH 27.9 26.0 - 34.0 pg   MCHC 33.1 30.0 - 36.0 g/dL   RDW 13.7 11.5 - 15.5 %   Platelets 292 150 - 400 K/uL     HEENT: Normocephalic, atraumatic Cardio: RRR and no JVD Resp: CTA B/L and unlabored GI: BS positive and ND Skin:   Intact. Warm and dry Neuro: Lethargic,  Motor: 4-/5 Left HF KE, 0/5 at ankle , tone MAS 2 at Left finger flexors 1 at elbow flexors 0/5 in LUE with spastic tone throughout Musc/Skel:  No edema, no tenderness Gen NAD. Vital signs reivewed  Assessment/Plan: 1. Functional deficits secondary to Right MCA infarct  which require 3+ hours per day of interdisciplinary therapy in a comprehensive inpatient rehab setting. Physiatrist is providing close team supervision and 24 hour management of active medical problems listed below. Physiatrist and rehab team continue to assess barriers to discharge/monitor patient progress toward functional and medical goals. FIM: Function - Bathing Position: Shower Body parts bathed by patient: Left arm, Chest, Abdomen, Front perineal area, Right upper leg, Left upper leg Body parts bathed by helper: Right arm, Buttocks, Left lower leg, Right lower leg, Back Assist Level: Touching or steadying assistance(Pt > 75%)  Function- Upper Body Dressing/Undressing What is the patient wearing?: Pull over shirt/dress Pull over shirt/dress - Perfomed by patient: Thread/unthread right sleeve, Put head through opening, Thread/unthread left sleeve, Pull shirt over trunk Pull over shirt/dress - Perfomed by helper: Thread/unthread left sleeve, Pull shirt over trunk Assist Level: Supervision or verbal cues Function - Lower Body Dressing/Undressing What is the patient wearing?: Pants, Underwear, Socks, Shoes Position: Wheelchair/chair at [employer] - Performed by patient: Thread/unthread right underwear leg, Thread/unthread left underwear leg Underwear - Performed by helper: Pull underwear up/down Pants- Performed by  patient: Thread/unthread right pants leg, Thread/unthread left pants leg Pants- Performed by helper: Pull pants up/down Non-skid slipper socks- Performed by helper: Don/doff right sock, Don/doff left sock Socks - Performed by patient: Don/doff right sock Socks - Performed by helper: Don/doff right sock, Don/doff left sock Shoes - Performed by patient: Don/doff right shoe Shoes - Performed by helper: Don/doff right shoe, Don/doff left shoe, Fasten right, Fasten left Assist for footwear: Partial/moderate assist Assist for lower body dressing: Touching or steadying  assistance (Pt > 75%) (to cross leg over knee to thread L leg)  Function - Toileting Toileting activity did not occur: No continent bowel/bladder event Toileting steps completed by patient: Performs perineal hygiene Toileting steps completed by helper: Performs perineal hygiene, Adjust clothing after toileting, Adjust clothing prior to toileting Toileting Assistive Devices: Grab bar or rail Assist level: Touching or steadying assistance (Pt.75%)  Function - Air cabin crew transfer assistive device: Grab bar Assist level to toilet: Moderate assist (Pt 50 - 74%/lift or lower) Assist level from toilet: Moderate assist (Pt 50 - 74%/lift or lower)  Function - Chair/bed transfer Chair/bed transfer method: Squat pivot Chair/bed transfer assist level: Touching or steadying assistance (Pt > 75%) Chair/bed transfer assistive device: Armrests Chair/bed transfer details: Verbal cues for safe use of DME/AE, Tactile cues for weight shifting, Verbal cues for sequencing  Function - Locomotion: Wheelchair Will patient use wheelchair at discharge?:  (TBD) Type: Manual Max wheelchair distance: 16f Assist Level: Supervision or verbal cues Assist Level: Supervision or verbal cues Assist Level: Supervision or verbal cues Turns around,maneuvers to table,bed, and toilet,negotiates 3% grade,maneuvers on rugs and over doorsills: No Function - Locomotion: Ambulation Assistive device: Rail in hallway Max distance: 374fAssist level: Maximal assist (Pt 25 - 49%) Assist level: Maximal assist (Pt 25 - 49%) Walk 50 feet with 2 turns activity did not occur: Safety/medical concerns Assist level: Maximal assist (Pt 25 - 49%) Walk 150 feet activity did not occur: Safety/medical concerns Walk 10 feet on uneven surfaces activity did not occur: Safety/medical concerns  Function - Comprehension Comprehension: Auditory Comprehension assist level: Understands complex 90% of the time/cues 10% of the  time  Function - Expression Expression: Verbal Expression assist level: Expresses complex ideas: With no assist  Function - Social Interaction Social Interaction assist level: Interacts appropriately 90% of the time - Needs monitoring or encouragement for participation or interaction.  Function - Problem Solving Problem solving assist level: Solves basic 75 - 89% of the time/requires cueing 10 - 24% of the time  Function - Memory Memory assist level: Recognizes or recalls 75 - 89% of the time/requires cueing 10 - 24% of the time Patient normally able to recall (first 3 days only): Current season, That he or she is in a hospital, Staff names and faces, Location of own room   Medical Problem List and Plan: 1. Left hemiparesis secondary to Right MCA infarct  ASA and Plavix  Cont CIR   PT,OT, SLP- progressing toward goals 2. DVT Prophylaxis/Anticoagulation: Pharmaceutical: Lovenox   Plt 292K on 7/31 3. Migraine Headaches/Pain Management: has been getting Toradol since 7/14. On low dose topamax--will change to nights to help with sedation and may need titrated upwards. Sensory dysesthesia LLE improved 4. Mood: LCSW to follow for evaluation and support.  5. Neuropsych: This patient is not fullycapable of making decisions on herown behalf. 6. Skin/Wound Care: routine pressure relief measures  7. Fluids/Electrolytes/Nutrition: Liquids advanced to thins Monitor I/O.  Low alb will supplement with prostat  BMP  within acceptable range on 7/31   Protein supplement changed due to pt preference 8. Dyslipidemia: On Lipitor. 9. Thyroid nodule: follow up on outpatient basis 10. Obesity: Educate on importance of weight loss with appropriate diet to promote health and mobility.  11. Question antiphospholipid syndrome: high Beta 2-glycoprotein IgG. Will need recheck labs in 12 weeks. 12.  Spasticity LUE and LLE- improving  resting hand splint   Cont tizanidine 27m qhs, no spasm which disturb  sleep 13.  Constipation  Started senna 2 po BID- improved although occ incont  15.  Urinary incont- cont toileting program- afebrile without dysuria, WBC normal 7/31 LOS (Days) 13 A FACE TO FACE EVALUATION WAS PERFORMED  KIRSTEINS,ANDREW E 12/23/2016, 7:13 AM

## 2016-12-23 NOTE — Progress Notes (Signed)
Physical Therapy Session Note  Patient Details  Name: Shannon Erickson MRN: [id number] Date of Birth: 12/02/1975  Today's Date: 12/23/2016 PT Individual Time: 1300-1400 PT Individual Time Calculation (min): 60 min   Short Term Goals: Week 2:  PT Short Term Goal 1 (Week 2): Pt will ambulate 2150' with LRAD and +1 assist PT Short Term Goal 2 (Week 2): Pt will demonstrate stand/pivot transfer with min assist PT Short Term Goal 3 (Week 2): Pt will negotiate 4 steps with 1 rail and max assist  Skilled Therapeutic Interventions/Progress Updates:    no c/o pain. PT session focused on orthotic consult and L LE NMR during pre-gait and ambulation with UE NMR via weight bearing.  Pt sitting in w/c with daughter present upon arrival, agreeable to PT. PT rolled pt to therapy gym for energy conservation and time. Sit to stand throughout session with min A for steadying and focus on equal LE weight bearing when coming to standing. In parallel bars pt attempted to perform weight shifting with B UE support on bars, with daughter assisting L UE, and mod A for balance with PT providing tactile cues for L LE knee and hip extension, L weight shift, and upright posture. Orthotic consult arrived with ground reaction force AFO and leather shoe cap. Ambulated 8930ft at hallway rail with AFO and leather shoe cap with max A for balance with PT providing assist with L LE placement, due to adduction tone, and occasional stance control facilitation for knee extension. Pt education on AFO purpose and use and pt wearing AFO remainder of treatment. Squat pivot w/c to/from mat with min A for steadying. Sit <> stand throughout with min A for steadying. Standing pre-gait with R LE stepping forward/backward 3 sets to fatigue, seated rest breaks between, B UE support on beside table with daughter providing assist with L UE placement with weight bearing and PT providing mod A for balance. PT provided tactile cues for L knee and hip extension  and L weight shift with verbal cues for upright posture and L weight shift. Pt returned to room in w/c by daughter.  Therapy Documentation Precautions:  Precautions Precautions: Fall Precaution Comments: L hemiplegia Restrictions Weight Bearing Restrictions: Yes   See Function Navigator for Current Functional Status.   Therapy/Group: Individual Therapy  Nicholette Dolson 12/23/2016, 5:36 PM

## 2016-12-24 ENCOUNTER — Inpatient Hospital Stay (HOSPITAL_COMMUNITY): Payer: Medicaid Other | Admitting: Occupational Therapy

## 2016-12-24 NOTE — Progress Notes (Signed)
Subjective/Complaints:  No issues overnite  ROS- Denies CP, SOB, N/V/D   Objective: Vital Signs: Blood pressure (!) 100/47, pulse 72, temperature 98.9 F (37.2 C), temperature source Oral, resp. rate 16, height $RemoveBefo [id number]$5\' 3"0 %. No results found. No results found for this or any previous visit (from the past 72 hour(s)).   HEENT: Normocephalic, atraumatic Cardio: RRR and no JVD Resp: CTA B/L and unlabored GI: BS positive and ND Skin:   Intact. Warm and dry Neuro: Lethargic,  Motor: 4-/5 Left HF KE, 0/5 at ankle , tone MAS 2 at Left finger flexors 1 at elbow flexors 0/5 in LUE with spastic tone throughout Musc/Skel:  No edema, no tenderness Gen NAD. Vital signs reivewed  Assessment/Plan: 1. Functional deficits secondary to Right MCA infarct which require 3+ hours per day of interdisciplinary therapy in a comprehensive inpatient rehab setting. Physiatrist is providing close team supervision and 24 hour management of active medical problems listed below. Physiatrist and rehab team continue to assess barriers to discharge/monitor patient progress toward functional and medical goals. FIM: Function - Bathing Position: Shower Body parts bathed by patient: Left arm, Chest, Abdomen, Front perineal area, Right upper leg, Left upper leg Body parts bathed by helper: Right arm, Buttocks, Left lower leg, Right lower leg, Back Assist Level: Touching or steadying assistance(Pt > 75%)  Function- Upper Body Dressing/Undressing What is the patient wearing?: Pull over shirt/dress Pull over shirt/dress - Perfomed by patient: Thread/unthread right sleeve, Pull shirt over trunk, Put head through opening, Thread/unthread left sleeve Pull over shirt/dress - Perfomed by helper: Thread/unthread left sleeve, Pull shirt over trunk Assist Level: Supervision or verbal cues Function - Lower Body Dressing/Undressing What is the patient wearing?: Underwear,  Pants Position: Wheelchair/chair at sink Underwear - Performed by patient: Thread/unthread right underwear leg, Thread/unthread left underwear leg Underwear - Performed by helper: Pull underwear up/down Pants- Performed by patient: Thread/unthread right pants leg, Thread/unthread left pants leg Pants- Performed by helper: Pull pants up/down Non-skid slipper socks- Performed by helper: Don/doff right sock, Don/doff left sock Socks - Performed by patient: Don/doff right sock Socks - Performed by helper: Don/doff right sock, Don/doff left sock Shoes - Performed by patient: Don/doff right shoe Shoes - Performed by helper: Don/doff right shoe, Don/doff left shoe, Fasten right, Fasten left Assist for footwear: Partial/moderate assist Assist for lower body dressing: Touching or steadying assistance (Pt > 75%)  Function - Toileting Toileting activity did not occur: No continent bowel/bladder event Toileting steps completed by patient: Performs perineal hygiene Toileting steps completed by helper: Performs perineal hygiene, Adjust clothing after toileting, Adjust clothing prior to toileting Toileting Assistive Devices: Grab bar or rail Assist level: Touching or steadying assistance (Pt.75%)  Function - ArchivistToilet Transfers Toilet transfer assistive device: Grab bar Assist level to toilet: Moderate assist (Pt 50 - 74%/lift or lower) Assist level from toilet: Moderate assist (Pt 50 - 74%/lift or lower)  Function - Chair/bed transfer Chair/bed transfer method: Squat pivot Chair/bed transfer assist level: Touching or steadying assistance (Pt > 75%) Chair/bed transfer assistive device: Armrests Chair/bed transfer details: Verbal cues for safe use of DME/AE, Verbal cues for technique  Function - Locomotion: Wheelchair Will patient use wheelchair at discharge?:  (TBD) Type: Manual Max wheelchair distance: 14950ft Assist Level: Supervision or verbal cues Assist Level: Supervision or verbal cues Assist  Level: Supervision or verbal cues Turns around,maneuvers to table,bed, and toilet,negotiates 3% grade,maneuvers on rugs and over doorsills: No  Function - Locomotion: Ambulation Assistive device: Rail in hallway Max distance: 6330ft Assist level: Maximal assist (Pt 25 - 49%) Assist level: Maximal assist (Pt 25 - 49%) Walk 50 feet with 2 turns activity did not occur: Safety/medical concerns Assist level: Maximal assist (Pt 25 - 49%) Walk 150 feet activity did not occur: Safety/medical concerns Walk 10 feet on uneven surfaces activity did not occur: Safety/medical concerns  Function - Comprehension Comprehension: Auditory Comprehension assist level: Understands complex 90% of the time/cues 10% of the time  Function - Expression Expression: Verbal Expression assist level: Expresses complex ideas: With no assist  Function - Social Interaction Social Interaction assist level: Interacts appropriately 90% of the time - Needs monitoring or encouragement for participation or interaction.  Function - Problem Solving Problem solving assist level: Solves complex 90% of the time/cues < 10% of the time  Function - Memory Memory assist level: Recognizes or recalls 90% of the time/requires cueing < 10% of the time Patient normally able to recall (first 3 days only): Current season, That he or she is in a hospital, Staff names and faces, Location of own room   Medical Problem List and Plan: 1. Left hemiparesis secondary to Right MCA infarct  ASA and Plavix  Cont CIR   PT,OT, SLP- progressing toward goals 2. DVT Prophylaxis/Anticoagulation: Pharmaceutical: Lovenox   Plt 292K on 7/31 3. Migraine Headaches/Pain Management: has been getting Toradol since 7/14. On low dose topamax--will change to nights to help with sedation and may need titrated upwards. Sensory dysesthesia LLE improved 4. Mood: LCSW to follow for evaluation and support.  5. Neuropsych: This patient is not fullycapable of making  decisions on herown behalf. 6. Skin/Wound Care: routine pressure relief measures  7. Fluids/Electrolytes/Nutrition: Liquids advanced to thins Monitor I/O.  Low alb will supplement with prostat  BMP within acceptable range on 7/31   Protein supplement changed due to pt preference 8. Dyslipidemia: On Lipitor. 9. Thyroid nodule: follow up on outpatient basis 10. Obesity: Educate on importance of weight loss with appropriate diet to promote health and mobility.  11. Question antiphospholipid syndrome: high Beta 2-glycoprotein IgG. Will need recheck labs in 12 weeks. 12.  Spasticity LUE and LLE- improving  resting hand splint   Cont tizanidine 4mg  qhs, no spasm which disturb sleep 13.  Constipation  Started senna 2 po BID- improved although occ incont  15.  Urinary incont- cont toileting program- afebrile without dysuria, WBC normal 7/31 LOS (Days) 14 A FACE TO FACE EVALUATION WAS PERFORMED  Dago Jungwirth E 12/24/2016, 7:03 AM

## 2016-12-24 NOTE — Progress Notes (Signed)
Occupational Therapy Session Note  Patient Details  Name: Shannon Erickson MRN: [id number] Date of Birth: 06/29/1975  Today's Date: 12/24/2016 OT Individual Time:  - 10-11   (60 min)      Short Term Goals: Week 1:  OT Short Term Goal 1 (Week 1): Pt will sit to stand consistantly with MOD A in prep for clothing management OT Short Term Goal 1 - Progress (Week 1): Met OT Short Term Goal 2 (Week 1): Pt will maintain midline during bathing tasks while seated with supervision OT Short Term Goal 2 - Progress (Week 1): Progressing toward goal OT Short Term Goal 3 (Week 1): Pt will locate all needed grooming items on L of sink with MIN VC OT Short Term Goal 3 - Progress (Week 1): Met OT Short Term Goal 4 (Week 1): Pt will recall hemi techniques for UB dressing for 2 consecutive sessions  OT Short Term Goal 4 - Progress (Week 1): Met OT Short Term Goal 5 (Week 1): Pt will transfer to [facility] with MOD A and LRAD PRN. OT Short Term Goal 5 - Progress (Week 1): Met Week 2:  OT Short Term Goal 1 (Week 2): Pt will maintain midline during bathing tasks while seated with supervision OT Short Term Goal 2 (Week 2): Pt will demonstrate proficiency with self ROM of LUE with min cues or less. OT Short Term Goal 3 (Week 2): Pt will demonstrate improved LUE body awareness by using R hand to position L arm prior to transfers. OT Short Term Goal 4 (Week 2): Pt will complete LB dressing with overall Mod A using hemi-techniques :     Skilled Therapeutic Interventions/Progress Updates:    Ppt lying in bed.  Daughter, [name] present.   Pt agreed to OT treatment of dressing at sink, standing balance, and transfers.  Pt went from supine to EOB with min assist.  Sat EOB and performed stand pivot transfer with daughter showing good technique and good carryover from previous sessions.  Ppt placed at  sink.  Performed standing balance with brace on RLE.  Addressed postural control and alignment; LUE weight bearing while  standing with max assist to maintain in weight bearing position;    LLE weight bearing while standing with facilitation for quad control and mid trunk isometric holding.   Maual tapping cues for pelvic alighnment.   Pt stood for5 minutes x3 while performing teeth and hair with OT providing  body corrections.  Pt able to  maintaini balance with mod assist.    Daughter assisting with carryover of ppt's posture and tor ppt to make adjustments as OT provided education.  Peformed LUE ROM with ppt showing HEP with good carryover.  Instructed ppt/daughter on scapular adduction and depression and sitting unsupported for balance and control.  Pt demonstrated good peripheral vision in Left eye.  Pt left in wc with daughter and all  needs in reach.   Therapy Documentation Precautions:  Precautions Precautions: Fall Precaution Comments: L hemiplegia Restrictions Weight Bearing Restrictions: No   Pain: none Pain Assessment Pain Assessment: No/denies pain    See Function Navigator for Current Functional Status.   Therapy/Group: Individual Therapy  Lisa Roca 12/24/2016, 10:13 AM

## 2016-12-25 ENCOUNTER — Inpatient Hospital Stay (HOSPITAL_COMMUNITY): Payer: Self-pay | Admitting: Physical Therapy

## 2016-12-25 NOTE — Progress Notes (Signed)
Physical Therapy Note  Patient Details  Name: Shannon Erickson MRN: [id number] Date of Birth: 12/02/1975 Today's Date: 12/25/2016    Time: 800-855 55 minutes  1:1 No c/o pain.  Pt requires max A for supine to sit for trunk and LEs.  Sit to stand to pull up pants with mod A for sit to stand and standing balance, mod A for pulling up pants on Lt side.  Transfers throughout session to both sides with min A.  At parallel bar pt performed pre gait wt shifts, stepping with Rt LE, sit to stand repetitions and tapping 2'' block with Rt LE all with mod/max manual facilitation for upright posture, Lt hip and knee control.  W/c mobility in home and controlled environments with supervision, hemi technique.  nustep x 5 minutes with bilat LEs only for LE strengthening level 3.  Pt left in room with quick release belt donned,needs at hand.   DONAWERTH,KAREN 12/25/2016, 8:55 AM

## 2016-12-25 NOTE — Progress Notes (Signed)
Subjective/Complaints:  RN just toileted pt, was cont  ROS- Denies CP, SOB, N/V/D   Objective: Vital Signs: Blood pressure (!) 107/58, pulse 81, temperature 98 F (36.7 C), temperature source Oral, resp. rate 16, height 5\' 3"  (1.6 m), weight 85.2 kg (187 lb 14.5 oz), SpO2 100 %. No results found. No results found for this or any previous visit (from the past 72 hour(s)).   HEENT: Normocephalic, atraumatic Cardio: RRR and no JVD Resp: CTA B/L and unlabored GI: BS positive and ND Skin:   Intact. Warm and dry Neuro: Lethargic,  Motor: 4-/5 Left HF KE, 0/5 at ankle , tone MAS 2 at Left finger flexors 1 at elbow flexors 0/5 in LUE with spastic tone throughout Musc/Skel:  No edema, no tenderness Gen NAD. Vital signs reivewed  Assessment/Plan: 1. Functional deficits secondary to Right MCA infarct which require 3+ hours per day of interdisciplinary therapy in a comprehensive inpatient rehab setting. Physiatrist is providing close team supervision and 24 hour management of active medical problems listed below. Physiatrist and rehab team continue to assess barriers to discharge/monitor patient progress toward functional and medical goals. FIM: Function - Bathing Position: Shower Body parts bathed by patient: Left arm, Chest, Abdomen, Front perineal area, Right upper leg, Left upper leg Body parts bathed by helper: Right arm, Buttocks, Left lower leg, Right lower leg, Back Assist Level: Touching or steadying assistance(Pt > 75%)  Function- Upper Body Dressing/Undressing What is the patient wearing?: Pull over shirt/dress Pull over shirt/dress - Perfomed by patient: Thread/unthread right sleeve, Pull shirt over trunk, Put head through opening, Thread/unthread left sleeve Pull over shirt/dress - Perfomed by helper: Thread/unthread left sleeve, Pull shirt over trunk Assist Level: Supervision or verbal cues Function - Lower Body Dressing/Undressing What is the patient wearing?:  Underwear, Pants Position: Wheelchair/chair at sink Underwear - Performed by patient: Thread/unthread right underwear leg, Thread/unthread left underwear leg Underwear - Performed by helper: Thread/unthread left underwear leg, Pull underwear up/down Pants- Performed by patient: Thread/unthread right pants leg Pants- Performed by helper: Thread/unthread left pants leg, Pull pants up/down Non-skid slipper socks- Performed by helper: Don/doff right sock, Don/doff left sock Socks - Performed by patient: Don/doff right sock Socks - Performed by helper: Don/doff right sock, Don/doff left sock Shoes - Performed by patient: Don/doff right shoe Shoes - Performed by helper: Don/doff right shoe, Don/doff left shoe, Fasten right, Fasten left Assist for footwear: Maximal assist Assist for lower body dressing: Touching or steadying assistance (Pt > 75%)  Function - Toileting Toileting activity did not occur: No continent bowel/bladder event Toileting steps completed by patient: Performs perineal hygiene Toileting steps completed by helper: Adjust clothing prior to toileting, Performs perineal hygiene, Adjust clothing after toileting Toileting Assistive Devices: Grab bar or rail Assist level: Two helpers  Function - ArchivistToilet Transfers Toilet transfer assistive device: Grab bar Assist level to toilet: Total assist (Pt < 25%) Assist level from toilet: Total assist (Pt < 25%)  Function - Chair/bed transfer Chair/bed transfer method: Squat pivot Chair/bed transfer assist level: Touching or steadying assistance (Pt > 75%) Chair/bed transfer assistive device: Armrests Chair/bed transfer details: Verbal cues for safe use of DME/AE, Verbal cues for technique  Function - Locomotion: Wheelchair Will patient use wheelchair at discharge?:  (TBD) Type: Manual Max wheelchair distance: 16350ft Assist Level: Supervision or verbal cues Assist Level: Supervision or verbal cues Assist Level: Supervision or verbal  cues Turns around,maneuvers to table,bed, and toilet,negotiates 3% grade,maneuvers on rugs and over doorsills: No Function -  Locomotion: Ambulation Assistive device: Rail in hallway Max distance: 6230ft Assist level: Maximal assist (Pt 25 - 49%) Assist level: Maximal assist (Pt 25 - 49%) Walk 50 feet with 2 turns activity did not occur: Safety/medical concerns Assist level: Maximal assist (Pt 25 - 49%) Walk 150 feet activity did not occur: Safety/medical concerns Walk 10 feet on uneven surfaces activity did not occur: Safety/medical concerns  Function - Comprehension Comprehension: Auditory Comprehension assist level: Understands basic 90% of the time/cues < 10% of the time  Function - Expression Expression: Verbal Expression assist level: Expresses basic 90% of the time/requires cueing < 10% of the time.  Function - Social Interaction Social Interaction assist level: Interacts appropriately 90% of the time - Needs monitoring or encouragement for participation or interaction.  Function - Problem Solving Problem solving assist level: Solves basic 90% of the time/requires cueing < 10% of the time  Function - Memory Memory assist level: Recognizes or recalls 90% of the time/requires cueing < 10% of the time Patient normally able to recall (first 3 days only): Current season, That he or she is in a hospital, Staff names and faces, Location of own room   Medical Problem List and Plan: 1. Left hemiparesis secondary to Right MCA infarct  ASA and Plavix  Cont CIR   PT,OT, SLP- progressing toward goals 2. DVT Prophylaxis/Anticoagulation: Pharmaceutical: Lovenox   Plt 292K on 7/31 3. Migraine Headaches/Pain Management: has been getting Toradol since 7/14. On low dose topamax--will change to nights to help with sedation and may need titrated upwards. Sensory dysesthesia LLE improved 4. Mood: LCSW to follow for evaluation and support.  5. Neuropsych: This patient is not fullycapable of  making decisions on herown behalf. 6. Skin/Wound Care: routine pressure relief measures  7. Fluids/Electrolytes/Nutrition: Liquids advanced to thins Monitor I/O.  Low alb will supplement with prostat  BMP within acceptable range on 7/31   Protein supplement changed due to pt preference 8. Dyslipidemia: On Lipitor. 9. Thyroid nodule: follow up on outpatient basis 10. Obesity: Educate on importance of weight loss with appropriate diet to promote health and mobility.  11. Question antiphospholipid syndrome: high Beta 2-glycoprotein IgG. Will need recheck labs in 12 weeks. 12.  Spasticity LUE and LLE- improving  resting hand splint   Cont tizanidine 4mg  qhs, no spasm which disturb sleep 13.  Constipation  Started senna 2 po BID- improved   15.  Urinary incont- Improving per RN LOS (Days) 15 A FACE TO FACE EVALUATION WAS PERFORMED  KIRSTEINS,ANDREW E 12/25/2016, 7:17 AM

## 2016-12-26 ENCOUNTER — Inpatient Hospital Stay (HOSPITAL_COMMUNITY): Payer: Medicaid Other | Admitting: Speech Pathology

## 2016-12-26 ENCOUNTER — Inpatient Hospital Stay (HOSPITAL_COMMUNITY): Payer: Self-pay | Admitting: Physical Therapy

## 2016-12-26 ENCOUNTER — Inpatient Hospital Stay (HOSPITAL_COMMUNITY): Payer: Self-pay | Admitting: Occupational Therapy

## 2016-12-26 MED ORDER — TIZANIDINE HCL 2 MG PO TABS
2.0000 mg | ORAL_TABLET | Freq: Two times a day (BID) | ORAL | Status: DC
  Administered 2016-12-26 – 2017-01-14 (×36): 2 mg via ORAL
  Filled 2016-12-26 (×38): qty 1

## 2016-12-26 MED ORDER — POLYETHYLENE GLYCOL 3350 17 G PO PACK
17.0000 g | PACK | Freq: Every day | ORAL | Status: DC
  Administered 2016-12-26 – 2017-01-10 (×10): 17 g via ORAL
  Filled 2016-12-26 (×17): qty 1

## 2016-12-26 NOTE — Progress Notes (Signed)
Occupational Therapy Session Note  Patient Details  Name: [name] MRN: [id number] Date of Birth: February 18, 1976  Today's Date: 12/26/2016 OT Individual Time: 1002-1100 OT Individual Time Calculation (min): 58 min   Short Term Goals: Week 2:  OT Short Term Goal 1 (Week 2): Pt will maintain midline during bathing tasks while seated with supervision OT Short Term Goal 2 (Week 2): Pt will demonstrate proficiency with self ROM of LUE with min cues or less. OT Short Term Goal 3 (Week 2): Pt will demonstrate improved LUE body awareness by using R hand to position L arm prior to transfers. OT Short Term Goal 4 (Week 2): Pt will complete LB dressing with overall Mod A using hemi-techniques  Skilled Therapeutic Interventions/Progress Updates:    OT treatment session focused on modified dressing, standing balance, L NMR, and NMES. Pt practiced LB dressing from bed level today. OT educated pt on modified techniques, and pt was able to position LLE to thread pant legg. OT then supported LLE and pt able to achieve full hip bridge to pull up pants. Pt completed stand-pivot to L with Min A. Sit<>stand at the sink with facilitation at hip and knee to elicit full hip/knee extension, while pt brushed teeth using one-handed strategies. Facilitated weight bearing through L UE during standing. L NMR in standing with towel pushes. Pt then completed 10 mins 1:1 NMES applied to  Wrist extensors, and supraspinatus and middle deltoid. No adverse reactions after treatment and is skin intact. Pt left seated in wc with needs met and daughter present.   Therapy Documentation Precautions:  Precautions Precautions: Fall Precaution Comments: L hemiplegia Restrictions Weight Bearing Restrictions: No Pain: Pain Assessment Pain Assessment: No/denies pain Pain Score: 0-No pain  See Function Navigator for Current Functional Status.   Therapy/Group: Individual Therapy  Valma Cava 12/26/2016, 10:52 AM

## 2016-12-26 NOTE — Progress Notes (Signed)
Speech Language Pathology Weekly Progress and Session Note  Patient Details  Name: Shannon Erickson MRN: [id number] Date of Birth: Sep 06, 1975  Beginning of progress report period: December 19, 2016 End of progress report period: December 26, 2016  Today's Date: 12/26/2016 SLP Individual Time: 1430-1500 SLP Individual Time Calculation (min): 30 min  Short Term Goals: Week 2: SLP Short Term Goal 1 (Week 2): Pt will use external memory aids to recall new daily information with Min A cues.  SLP Short Term Goal 1 - Progress (Week 2): Met SLP Short Term Goal 2 (Week 2): Pt will complete semi-complex problem solving tasks with Min A cues.  SLP Short Term Goal 2 - Progress (Week 2): Met SLP Short Term Goal 3 (Week 2): Pt will scan to left of her environment with Min A cues to locate objects during functional tasks.  SLP Short Term Goal 3 - Progress (Week 2): Met SLP Short Term Goal 4 (Week 2): Pt will demonstrate anticipatory awareness by listing 3 activities that are safe for her to participate in at discharge with Min A cues.  SLP Short Term Goal 4 - Progress (Week 2): Met    New Short Term Goals: Week 3: SLP Short Term Goal 1 (Week 3): Pt will use external memory aids to recall new daily information with Supervision verbal cues.  SLP Short Term Goal 2 (Week 3): Pt will complete semi-complex problem solving tasks with Supervision verbal cues.  SLP Short Term Goal 3 (Week 3): Pt will scan to left of her environment with supervision verbal cues to locate objects during functional tasks.  SLP Short Term Goal 4 (Week 3): Pt will demonstrate anticipatory awareness by listing 3 activities that are safe for her to participate in at discharge with Supervision verbal cues.   Weekly Progress Updates: Patient has made excellent gains and has met 4 of 4 STG's this reporting period. Currently, patient requires overall Min A to complete functional and familiar tasks safely in regards to problem solving, awareness,  visual scanning and recall. Patient and family education is ongoing. Patient would benefit from continued skilled SLP intervention to maximize her cognitive function and overall functional independence prior to discharge.      Intensity: Minumum of 1-2 x/day, 30 to 90 minutes Frequency: 3 to 5 out of 7 days Duration/Length of Stay: 01/14/17 Treatment/Interventions: Cognitive remediation/compensation;Cueing hierarchy;Functional tasks;Patient/family education;Therapeutic Activities;Internal/external aids   Daily Session  Skilled Therapeutic Interventions: Skilled treatment session focused on cognitive goals. Patient independently recalled procedures to a previously learned task with Mod I and required intermittent supervision verbal cues for problem solving with the mildly complex, novel card task. Patient left upright in wheelchair with daughter present. Continue with current plan of care.       Function:    Cognition Comprehension Comprehension assist level: Understands basic 90% of the time/cues < 10% of the time  Expression   Expression assist level: Expresses basic 90% of the time/requires cueing < 10% of the time.  Social Interaction Social Interaction assist level: Interacts appropriately 90% of the time - Needs monitoring or encouragement for participation or interaction.  Problem Solving Problem solving assist level: Solves basic 90% of the time/requires cueing < 10% of the time  Memory Memory assist level: Recognizes or recalls 90% of the time/requires cueing < 10% of the time   Pain No/Denies Pain   Therapy/Group: Individual Therapy  Vaniah Chambers 12/26/2016, 3:21 PM

## 2016-12-26 NOTE — Progress Notes (Signed)
Subjective/Complaints:  No issues overnite, remains incont, discussed with RN  ROS- Denies CP, SOB, N/V/D   Objective: Vital Signs: Blood pressure 106/62, pulse 89, temperature 98.2 F (36.8 C), temperature source Oral, resp. rate 16, height 5\' 3"  (1.6 m), weight 85.2 kg (187 lb 14.5 oz), SpO2 100 %. No results found. No results found for this or any previous visit (from the past 72 hour(s)).   HEENT: Normocephalic, atraumatic Cardio: RRR and no JVD Resp: CTA B/L and unlabored GI: BS positive and ND Skin:   Intact. Warm and dry Neuro: Lethargic,  Motor: 4-/5 Left HF KE, 0/5 at ankle , tone MAS 2 at Left finger flexors 1 at elbow flexors 0/5 in LUE with spastic tone throughout Musc/Skel:  No edema, no tenderness Gen NAD. Vital signs reivewed  Assessment/Plan: 1. Functional deficits secondary to Right MCA infarct which require 3+ hours per day of interdisciplinary therapy in a comprehensive inpatient rehab setting. Physiatrist is providing close team supervision and 24 hour management of active medical problems listed below. Physiatrist and rehab team continue to assess barriers to discharge/monitor patient progress toward functional and medical goals. FIM: Function - Bathing Position: Shower Body parts bathed by patient: Left arm, Chest, Abdomen, Front perineal area, Right upper leg, Left upper leg Body parts bathed by helper: Right arm, Buttocks, Left lower leg, Right lower leg, Back Assist Level: Touching or steadying assistance(Pt > 75%)  Function- Upper Body Dressing/Undressing What is the patient wearing?: Pull over shirt/dress Pull over shirt/dress - Perfomed by patient: Thread/unthread right sleeve, Pull shirt over trunk, Put head through opening, Thread/unthread left sleeve Pull over shirt/dress - Perfomed by helper: Thread/unthread left sleeve, Pull shirt over trunk Assist Level: Supervision or verbal cues Function - Lower Body Dressing/Undressing What is the  patient wearing?: Underwear, Pants Position: Wheelchair/chair at sink Underwear - Performed by patient: Thread/unthread right underwear leg, Thread/unthread left underwear leg Underwear - Performed by helper: Thread/unthread left underwear leg, Pull underwear up/down Pants- Performed by patient: Thread/unthread right pants leg Pants- Performed by helper: Thread/unthread left pants leg, Pull pants up/down Non-skid slipper socks- Performed by helper: Don/doff right sock, Don/doff left sock Socks - Performed by patient: Don/doff right sock Socks - Performed by helper: Don/doff right sock, Don/doff left sock Shoes - Performed by patient: Don/doff right shoe Shoes - Performed by helper: Don/doff right shoe, Don/doff left shoe, Fasten right, Fasten left Assist for footwear: Maximal assist Assist for lower body dressing: Touching or steadying assistance (Pt > 75%)  Function - Toileting Toileting activity did not occur: No continent bowel/bladder event Toileting steps completed by patient: Performs perineal hygiene Toileting steps completed by helper: Adjust clothing prior to toileting, Performs perineal hygiene, Adjust clothing after toileting Toileting Assistive Devices: Grab bar or rail Assist level: Two helpers  Function - ArchivistToilet Transfers Toilet transfer assistive device: Bedside commode Assist level to toilet: Moderate assist (Pt 50 - 74%/lift or lower) Assist level from toilet: Moderate assist (Pt 50 - 74%/lift or lower)  Function - Chair/bed transfer Chair/bed transfer method: Squat pivot Chair/bed transfer assist level: Touching or steadying assistance (Pt > 75%) Chair/bed transfer assistive device: Armrests Chair/bed transfer details: Verbal cues for safe use of DME/AE, Verbal cues for technique  Function - Locomotion: Wheelchair Will patient use wheelchair at discharge?:  (TBD) Type: Manual Max wheelchair distance: 18550ft Assist Level: Supervision or verbal cues Assist Level:  Supervision or verbal cues Assist Level: Supervision or verbal cues Turns around,maneuvers to table,bed, and toilet,negotiates 3% grade,maneuvers on  rugs and over doorsills: No Function - Locomotion: Ambulation Assistive device: Rail in hallway Max distance: 54ft Assist level: Maximal assist (Pt 25 - 49%) Assist level: Maximal assist (Pt 25 - 49%) Walk 50 feet with 2 turns activity did not occur: Safety/medical concerns Assist level: Maximal assist (Pt 25 - 49%) Walk 150 feet activity did not occur: Safety/medical concerns Walk 10 feet on uneven surfaces activity did not occur: Safety/medical concerns  Function - Comprehension Comprehension: Auditory Comprehension assist level: Understands basic 90% of the time/cues < 10% of the time  Function - Expression Expression: Verbal Expression assist level: Expresses basic 90% of the time/requires cueing < 10% of the time.  Function - Social Interaction Social Interaction assist level: Interacts appropriately 90% of the time - Needs monitoring or encouragement for participation or interaction.  Function - Problem Solving Problem solving assist level: Solves basic 90% of the time/requires cueing < 10% of the time  Function - Memory Memory assist level: Recognizes or recalls 90% of the time/requires cueing < 10% of the time Patient normally able to recall (first 3 days only): Current season, That he or she is in a hospital, Staff names and faces, Location of own room   Medical Problem List and Plan: 1. Left hemiparesis secondary to Right MCA infarct  ASA and Plavix  Cont CIR   PT,OT, SLP- progressing toward goals, little LUE recovery thus far 2. DVT Prophylaxis/Anticoagulation: Pharmaceutical: Lovenox   Plt 292K on 7/31 3. Migraine Headaches/Pain Management: has been getting Toradol since 7/14. On low dose topamax-. Sensory dysesthesia LLE improved 4. Mood: LCSW to follow for evaluation and support.  5. Neuropsych: This patient is not  fullycapable of making decisions on herown behalf. 6. Skin/Wound Care: routine pressure relief measures  7. Fluids/Electrolytes/Nutrition: Liquids advanced to thins Monitor I/O.  Low alb will supplement with prostat  BMP within acceptable range on 7/31   Protein supplement changed due to pt preference 8. Dyslipidemia: On Lipitor. 9. Thyroid nodule: follow up on outpatient basis 10. Obesity: Educate on importance of weight loss with appropriate diet to promote health and mobility.  11. Question antiphospholipid syndrome: high Beta 2-glycoprotein IgG. Will need recheck labs in 12 weeks. 12.  Spasticity LUE and LLE- improving  resting hand splint   Cont tizanidine 4mg  qhs, no spasm which disturb sleep 13.  Constipation- last BM 8/4  Refused senna due to diarrhea, will trial miralax  15.  Urinary incont-still using diaper LOS (Days) 16 A FACE TO FACE EVALUATION WAS PERFORMED  Zahra Peffley E 12/26/2016, 7:02 AM

## 2016-12-26 NOTE — Progress Notes (Signed)
Physical Therapy Weekly Progress Note  Patient Details  Name: Shannon Erickson MRN: [id number] Date of Birth: 16-Sep-1975  Beginning of progress report period: December 19, 2016 End of progress report period: December 26, 2016  Today's Date: 12/26/2016 PT Individual Time: 1300-1415 PT Individual Time Calculation (min): 75 min   Patient has met 3 of 3 short term goals.  Pt is making good progressing towards LTGs.  Patient continues to demonstrate the following deficits muscle weakness, decreased cardiorespiratoy endurance, impaired timing and sequencing, abnormal tone, unbalanced muscle activation and decreased coordination, decreased midline orientation, decreased attention to left and decreased motor planning, decreased safety awareness and decreased sitting balance, decreased standing balance, decreased postural control, hemiplegia and decreased balance strategies and therefore will continue to benefit from skilled PT intervention to increase functional independence with mobility.  Patient progressing toward long term goals..  Continue plan of care.  PT Short Term Goals Week 2:  PT Short Term Goal 1 (Week 2): Pt will ambulate 19' with LRAD and +1 assist PT Short Term Goal 1 - Progress (Week 2): Met PT Short Term Goal 2 (Week 2): Pt will demonstrate stand/pivot transfer with min assist PT Short Term Goal 2 - Progress (Week 2): Met PT Short Term Goal 3 (Week 2): Pt will negotiate 4 steps with 1 rail and max assist PT Short Term Goal 3 - Progress (Week 2): Met Week 3:  PT Short Term Goal 1 (Week 3): Pt will ambulate 134f with mod A with LRAD PT Short Term Goal 2 (Week 3): Pt will perform bed mobility without bed rails and without HOB elevated with min A PT Short Term Goal 3 (Week 3): Pt will ascend/descend 12 stairs with mod A  Skilled Therapeutic Interventions/Progress Updates:    no c/o pain. PT treatment session focused on L LE NMR during functional activities, stair negotiation, trunk NMR,  and midline orientation.  Pt sitting in w/c upon arrival, pt agreeable to PT. PT rolled pt to/from therapy gym for energy conservation and time. Ambulated forward and backwards in parallel bars 4 sets to fatigue, sitting rest break between, with B UE support with 1 PT assisting with L UE placement and 1 providing mod A for trunk control and L LE placement. PT provided facilitation with verbal and tactile cues for upright posture, L LE placement, L knee extension, and L weight shifting during stance phase of gait. Ascend/descend 8 stairs (3 inch) with 1 hand rail and mod A for trunk control with pt able to place L LE on step and PT assist for positioning. Stand pivot transfer w/c to mat with min A for steadying. Sit to stands from raised mat with R LE blocked on step to increase use of L LE with verbal and tactile cues for L weight shift ad mod A for balance and trunk control to come to standing. Standing midline orientation between sit to stands with mod A for balance with mirror feedback and multimodal cues for L weight shifting, upright posture, and L knee extension. Attempted anterior/posterior scooting with pt unable to lift L hip off mat. Performed R lateral trunk lean to forearm with focus on L trunk activation to return upright.  Ambulate 950fwith hemiwalker with max A for trunk control and balance with verbal cues for sequencing and placement of AD with LE stepping. PT provided verbal and tactile cues for upright posture, L LE step length and AD placement. Pt returned to room and left in w/c with call bell in reach  and daughter present.  Therapy Documentation Precautions:  Precautions Precautions: Fall Precaution Comments: L hemiplegia Restrictions Weight Bearing Restrictions: No   See Function Navigator for Current Functional Status.  Therapy/Group: Individual Therapy  Haldon Carley 12/26/2016, 5:21 PM

## 2016-12-26 NOTE — Progress Notes (Signed)
Physical Therapy Session Note  Patient Details  Name: Shannon Erickson MRN: [id number] Date of Birth: 10/15/1975  Today's Date: 12/26/2016 PT Individual Time: 1130-1200 PT Individual Time Calculation (min): 30 min   Short Term Goals: Week 2:  PT Short Term Goal 1 (Week 2): Pt will ambulate 8350' with LRAD and +1 assist PT Short Term Goal 2 (Week 2): Pt will demonstrate stand/pivot transfer with min assist PT Short Term Goal 3 (Week 2): Pt will negotiate 4 steps with 1 rail and max assist  Skilled Therapeutic Interventions/Progress Updates:    no c/o pain. PT treatment session focused on bed mobility simulating home environment, sit to stands, and pre-gait.  Pt sitting in w/c upon arrival with daughter present, pt agreeable to PT. Squat pivot transfer w/c <> bed with close supervision for safety. Sit to supine with HOB flat x 3 with supervision and verbal cues for technique. Supine to sit with HOB flat and no bed rails with mod A x2 for forward weight shift to come to sitting. Supine to sit x 1 with HOB flat using bed rails with supervision for safety. PT rolled pt in w/c to/from room for time. Sit to stand from w/c with mod A for balance and lifting with verbal and tactile cues for L weight shift and increased L LE weight bearing. Pre-gait in parallel bars with B UE support and mod A for L knee blocking and for trunk control with verbal cues for upright posture and tactile cues for L weight shift. Pt returned to room and left sitting in w/c with daughter present.   Therapy Documentation Precautions:  Precautions Precautions: Fall Precaution Comments: L hemiplegia Restrictions Weight Bearing Restrictions: No  See Function Navigator for Current Functional Status.   Therapy/Group: Individual Therapy  Hareem Surowiec 12/26/2016, 12:22 PM

## 2016-12-26 NOTE — Plan of Care (Signed)
Problem: RH SKIN INTEGRITY Goal: RH STG SKIN FREE OF INFECTION/BREAKDOWN With min assist  Outcome: Progressing Some episodes of continence with timed toileting

## 2016-12-27 ENCOUNTER — Inpatient Hospital Stay (HOSPITAL_COMMUNITY): Payer: Self-pay | Admitting: Physical Therapy

## 2016-12-27 ENCOUNTER — Inpatient Hospital Stay (HOSPITAL_COMMUNITY): Payer: Self-pay

## 2016-12-27 ENCOUNTER — Inpatient Hospital Stay (HOSPITAL_COMMUNITY): Payer: Medicaid Other | Admitting: Occupational Therapy

## 2016-12-27 NOTE — Progress Notes (Signed)
Occupational Therapy Weekly Progress Note  Patient Details  Name: Shannon Erickson MRN: [id number] Date of Birth: 11/13/1975  Beginning of progress report period: December 11, 2016 End of progress report period: December 27, 2016  Today's Date: 12/27/2016 OT Individual Time: 0900-1015 OT Individual Time Calculation (min): 75 min   Patient has met 3 of 4 short term goals.  Pt is making steady progress with OT treatments at this time.  She is able to complete squat-pivot functional transfers with consistent min A, LUE function is progressing slowly, with some increased shoulder activation noted this week. Pt also has increased tone, but  Is improving. LB dressing from a seated position continues to be challenging, but pt is making progress with modified strategies. Pt has demonstrated improved awareness of L UE and is initiating use of L UE during bathing tasks by using R hand.    Patient continues to demonstrate the following deficits: muscle weakness, abnormal tone and unbalanced muscle activation, decreased midline orientation and decreased attention to left, decreased initiation, decreased attention, decreased awareness and decreased problem solving and decreased sitting balance, decreased standing balance, decreased postural control, hemiplegia and decreased balance strategies and therefore will continue to benefit from skilled OT intervention to enhance overall performance with BADL and Reduce care partner burden.  Patient progressing toward long term goals..  Continue plan of care.  OT Short Term Goals Week 2:  OT Short Term Goal 1 (Week 2): Pt will maintain midline during bathing tasks while seated with supervision OT Short Term Goal 1 - Progress (Week 2): Met OT Short Term Goal 2 (Week 2): Pt will demonstrate proficiency with self ROM of LUE with min cues or less. OT Short Term Goal 2 - Progress (Week 2): Met OT Short Term Goal 3 (Week 2): Pt will demonstrate improved LUE body awareness by  using R hand to position L arm prior to transfers. OT Short Term Goal 3 - Progress (Week 2): Progressing toward goal OT Short Term Goal 4 (Week 2): Pt will complete LB dressing with overall Mod A using hemi-techniques OT Short Term Goal 4 - Progress (Week 2): Met Week 3:  OT Short Term Goal 1 (Week 3): Pt will demonstrate improved LUE body awareness by using R hand to position L arm prior to transfers. OT Short Term Goal 2 (Week 3): Pt will complete shower transfers with consistent supervision and out of shower OT Short Term Goal 3 (Week 3): Pt will don AFO with min A OT Short Term Goal 4 (Week 3): Pt will thread both pant legs with min A  Skilled Therapeutic Interventions/Progress Updates:    OT treatment session focused on L NMR, standing balance, functional transfers, and modified bathing/dressing. Pt greeted seated on toilet with daughter present. Pt completed hygiene, then completed stand-pivot to wc with min A. Mod A to transfer into shower 2/2 LOB and difficulty weight bearing through LLE 2/2 hypertonicity this am. Pt needed assistance to don L bathing glove, then provided guided A to weight bear through L UE . Pt required questioning cues to initiate bathing tasks today. Worked on LB dressing techniques by propping LEs on chair, with increased access to lower body. Worked on standing balance/endurance with facilitation at L hip/knee to promote L weight shift and hip/knee extension. Pt tolerated 10 mins standing while brushing teeth and brushing hair. Pt needed seated rest break, then stood for another 5 mins to finish brushing hair. Gentle ROM to L UE and massage to decrease tone. Pt left  with L lap tray, call bell, and needs met.  Therapy Documentation Precautions:  Precautions Precautions: Fall Precaution Comments: L hemiplegia Restrictions Weight Bearing Restrictions: No Pain: Pain Assessment Pain Assessment: No/denies pain Pain Score: 0-No pain  See Function Navigator for Current  Functional Status.   Therapy/Group: Individual Therapy  Valma Cava 12/27/2016, 10:15 AM

## 2016-12-27 NOTE — Progress Notes (Signed)
Subjective/Complaints:  C/o excess bruising/discomfort with lovenox injection  ROS- Denies CP, SOB, N/V/D   Objective: Vital Signs: Blood pressure (!) 98/57, pulse 83, temperature 98.2 F (36.8 C), temperature source Oral, resp. rate 18, height 5\' 3"  (1.6 m), weight 85.2 kg (187 lb 14.5 oz), SpO2 100 %. No results found. No results found for this or any previous visit (from the past 72 hour(s)).   HEENT: Normocephalic, atraumatic Cardio: RRR and no JVD Resp: CTA B/L and unlabored GI: BS positive and ND Skin:   Intact. Warm and dry, ecchymosis from injection lower abd but no hematoma Neuro: Lethargic,  Motor: 4-/5 Left HF KE, 0/5 at ankle , tone MAS 2 at Left finger flexors 1 at elbow flexors 0/5 in LUE with spastic tone throughout Musc/Skel:  No edema, no tenderness Gen NAD. Vital signs reivewed  Assessment/Plan: 1. Functional deficits secondary to Right MCA infarct which require 3+ hours per day of interdisciplinary therapy in a comprehensive inpatient rehab setting. Physiatrist is providing close team supervision and 24 hour management of active medical problems listed below. Physiatrist and rehab team continue to assess barriers to discharge/monitor patient progress toward functional and medical goals. FIM: Function - Bathing Position: Shower Body parts bathed by patient: Left arm, Chest, Abdomen, Front perineal area, Right upper leg, Left upper leg Body parts bathed by helper: Right arm, Buttocks, Left lower leg, Right lower leg, Back Assist Level: Touching or steadying assistance(Pt > 75%)  Function- Upper Body Dressing/Undressing What is the patient wearing?: Pull over shirt/dress Pull over shirt/dress - Perfomed by patient: Thread/unthread right sleeve, Pull shirt over trunk, Put head through opening, Thread/unthread left sleeve Pull over shirt/dress - Perfomed by helper: Thread/unthread left sleeve, Pull shirt over trunk Assist Level: Supervision or verbal  cues Function - Lower Body Dressing/Undressing What is the patient wearing?: Underwear, Pants Position: Wheelchair/chair at sink Underwear - Performed by patient: Thread/unthread right underwear leg, Thread/unthread left underwear leg Underwear - Performed by helper: Thread/unthread left underwear leg, Pull underwear up/down Pants- Performed by patient: Thread/unthread right pants leg Pants- Performed by helper: Thread/unthread left pants leg, Pull pants up/down Non-skid slipper socks- Performed by helper: Don/doff right sock, Don/doff left sock Socks - Performed by patient: Don/doff right sock Socks - Performed by helper: Don/doff right sock, Don/doff left sock Shoes - Performed by patient: Don/doff right shoe Shoes - Performed by helper: Don/doff right shoe, Don/doff left shoe, Fasten right, Fasten left Assist for footwear: Maximal assist Assist for lower body dressing: Touching or steadying assistance (Pt > 75%)  Function - Toileting Toileting activity did not occur: No continent bowel/bladder event Toileting steps completed by patient: Performs perineal hygiene Toileting steps completed by helper: Adjust clothing prior to toileting, Performs perineal hygiene, Adjust clothing after toileting Toileting Assistive Devices: Grab bar or rail Assist level: Touching or steadying assistance (Pt.75%)  Function - Archivist transfer assistive device: Bedside commode Assist level to toilet: Moderate assist (Pt 50 - 74%/lift or lower) Assist level from toilet: Moderate assist (Pt 50 - 74%/lift or lower)  Function - Chair/bed transfer Chair/bed transfer method: Stand pivot Chair/bed transfer assist level: Touching or steadying assistance (Pt > 75%) Chair/bed transfer assistive device: Armrests Chair/bed transfer details: Verbal cues for precautions/safety, Verbal cues for sequencing  Function - Locomotion: Wheelchair Will patient use wheelchair at discharge?:  (TBD) Type:  Manual Max wheelchair distance: 115ft Assist Level: Supervision or verbal cues Assist Level: Supervision or verbal cues Assist Level: Supervision or verbal cues Turns around,maneuvers  to table,bed, and toilet,negotiates 3% grade,maneuvers on rugs and over doorsills: No Function - Locomotion: Ambulation Assistive device: Walker-hemi Max distance: 6390ft Assist level: Maximal assist (Pt 25 - 49%) Assist level: Maximal assist (Pt 25 - 49%) Walk 50 feet with 2 turns activity did not occur: Safety/medical concerns Assist level: Maximal assist (Pt 25 - 49%) Walk 150 feet activity did not occur: Safety/medical concerns Walk 10 feet on uneven surfaces activity did not occur: Safety/medical concerns  Function - Comprehension Comprehension: Auditory Comprehension assist level: Understands basic 90% of the time/cues < 10% of the time  Function - Expression Expression: Verbal Expression assist level: Expresses basic 90% of the time/requires cueing < 10% of the time.  Function - Social Interaction Social Interaction assist level: Interacts appropriately 90% of the time - Needs monitoring or encouragement for participation or interaction.  Function - Problem Solving Problem solving assist level: Solves basic 90% of the time/requires cueing < 10% of the time  Function - Memory Memory assist level: Recognizes or recalls 90% of the time/requires cueing < 10% of the time Patient normally able to recall (first 3 days only): Current season, That he or she is in a hospital, Staff names and faces, Location of own room   Medical Problem List and Plan: 1. Left hemiparesis secondary to Right MCA infarct  ASA and Plavix  Cont CIR   PT,OT, SLP- team conf in am          2. DVT Prophylaxis/Anticoagulation: Pharmaceutical: Lovenox - pt c/o excessive bruising, no hematoma formation, discussed alternative treatment of SCD and lower efficacy of DVT prevention, pt and her daughter wish to proceed with  this  3. Migraine Headaches/Pain Management: has been getting Toradol since 7/14. On low dose topamax-. Sensory dysesthesia LLE improved 4. Mood: LCSW to follow for evaluation and support.  5. Neuropsych: This patient is not fullycapable of making decisions on herown behalf. 6. Skin/Wound Care: routine pressure relief measures  7. Fluids/Electrolytes/Nutrition: Liquids advanced to thins Monitor I/O.  Low alb will supplement with prostat  BMP within acceptable range on 7/31   Protein supplement changed due to pt preference 8. Dyslipidemia: On Lipitor. 9. Thyroid nodule: follow up on outpatient basis 10. Obesity: Educate on importance of weight loss with appropriate diet to promote health and mobility.  11. Question antiphospholipid syndrome: high Beta 2-glycoprotein IgG. Will need recheck labs in 12 weeks. 12.  Spasticity LUE and LLE- improving  resting hand splint   Cont tizanidine 4mg  qhs, no spasm which disturb sleep 13.  Constipation- last BM 8/4  Refused senna due to diarrhea, will trial miralax  15.  Urinary incont-still using diaper LOS (Days) 17 A FACE TO FACE EVALUATION WAS PERFORMED  KIRSTEINS,ANDREW E 12/27/2016, 6:58 AM

## 2016-12-27 NOTE — Progress Notes (Signed)
Physical Therapy Session Note  Patient Details  Name: Shannon Erickson MRN: [id number] Date of Birth: 08/28/1975  Today's Date: 12/27/2016 PT Individual Time: 1500-1615 PT Individual Time Calculation (min): 75 min   Short Term Goals: Week 3:  PT Short Term Goal 1 (Week 3): Pt will ambulate 18250ft with mod A with LRAD PT Short Term Goal 2 (Week 3): Pt will perform bed mobility without bed rails and without HOB elevated with min A PT Short Term Goal 3 (Week 3): Pt will ascend/descend 12 stairs with mod A  Skilled Therapeutic Interventions/Progress Updates:    no c/o pain. PT treatment session focused on L LE NMR during standing and gait, trunk NMR, and midline orientation.  Pt supine in bed asleep upon arrival with daughter present, pt agreeable to PT. Supine to sit using bed rails with min A for steadying. Squat pivot bed to w/c with min A for steadying. Ambulated forward/backwards in parallel bars with B UE support 10616ft x2, seated rest break between. PT provided L UE support on bar and tactile cues for weight shifting with verbal cues for L LE placement. Standing in parallel bars with R LE on step for increased L weight shift and L LE weight bearing with mod A for standing balance. PT provided verbal and tactile cues for L LE knee extension, L weight shift, upright posture, midline orientation, and forward hip alignment with daughter providing L UE placement on bar. Pt got into tall kneeling on mat with + 2 assist for balance, L LE placement and safety. In tall kneeling with B UE support with min A progressing to no UE support with mod A for balance and occasional max A for LOB posteriorly. PT providing tactile and verbal cues for upright posture, midline orientation, trunk control, hip extension, and forward hip alignment. Sitting balance EOM with UE task reaching across midline with min guard A for balance and occasional max A for recovery of L LOB. Sitting EOM lateral trunk leans to forearm 2 sets  of 15 each with supervision leaning to R and min to max A leaning to L with PT providing facilitation for proper L UE alignment. Squat pivot mat to w/c with min assist for steadying. Pt rolled daughter to room for time. Squat pivot w/c to bed with daughter assisting min A for steadying with verbal cues for L LE placement. Sit to supine with supervision. Pt left supine in bed with call bell in reach and daughter present.  Therapy Documentation Precautions:  Precautions Precautions: Fall Precaution Comments: L hemiplegia Restrictions Weight Bearing Restrictions: No   See Function Navigator for Current Functional Status.   Therapy/Group: Individual Therapy  Giordano Getman 12/27/2016, 5:31 PM

## 2016-12-27 NOTE — Progress Notes (Signed)
Speech Language Pathology Daily Session Note  Patient Details  Name: Shannon Erickson MRN: [id number] Date of Birth: 12/05/1975  Today's Date: 12/27/2016 SLP Individual Time: 8270-7867 SLP Individual Time Calculation (min): 45 min  Short Term Goals: Week 2: SLP Short Term Goal 1 (Week 2): Pt will use external memory aids to recall new daily information with Min A cues.  SLP Short Term Goal 1 - Progress (Week 2): Met SLP Short Term Goal 2 (Week 2): Pt will complete semi-complex problem solving tasks with Min A cues.  SLP Short Term Goal 2 - Progress (Week 2): Met SLP Short Term Goal 3 (Week 2): Pt will scan to left of her environment with Min A cues to locate objects during functional tasks.  SLP Short Term Goal 3 - Progress (Week 2): Met SLP Short Term Goal 4 (Week 2): Pt will demonstrate anticipatory awareness by listing 3 activities that are safe for her to participate in at discharge with Min A cues.  SLP Short Term Goal 4 - Progress (Week 2): Met  Skilled Therapeutic Interventions: Skilled ST treatment focused on cognitive goals. SLP facilitated complex organization and problem solving tasks, utilizing reading and eduction skills requiring extended time for processing and Min A verbal and question cues. Pt recalled today's therapy schedule following 45 min delay without visual aid. Pt was left in bed with call bell within reach. Recommend to continue ST services at this time.      Function:  Comprehension Comprehension assist level: Understands basic 90% of the time/cues < 10% of the time  Expression   Expression assist level: Expresses basic 90% of the time/requires cueing < 10% of the time.  Social Interaction Social Interaction assist level: Interacts appropriately 90% of the time - Needs monitoring or encouragement for participation or interaction.  Problem Solving Problem solving assist level: Solves basic 90% of the time/requires cueing < 10% of the time  Memory Memory assist  level: Recognizes or recalls 90% of the time/requires cueing < 10% of the time    Pain Pain Assessment Pain Assessment: No/denies pain Pain Score: 0-No pain  Therapy/Group: Individual Therapy  Irlanda Croghan  [facility] 12/27/2016, 9:46 AM

## 2016-12-28 ENCOUNTER — Inpatient Hospital Stay (HOSPITAL_COMMUNITY): Payer: Self-pay | Admitting: Occupational Therapy

## 2016-12-28 ENCOUNTER — Encounter (HOSPITAL_COMMUNITY): Payer: Self-pay | Admitting: Psychology

## 2016-12-28 ENCOUNTER — Inpatient Hospital Stay (HOSPITAL_COMMUNITY): Payer: Medicaid Other | Admitting: Physical Therapy

## 2016-12-28 ENCOUNTER — Inpatient Hospital Stay (HOSPITAL_COMMUNITY): Payer: Self-pay | Admitting: Speech Pathology

## 2016-12-28 LAB — BASIC METABOLIC PANEL
Anion gap: 8 (ref 5–15)
BUN: 11 mg/dL (ref 6–20)
CHLORIDE: 110 mmol/L (ref 101–111)
CO2: 21 mmol/L — ABNORMAL LOW (ref 22–32)
Calcium: 8.3 mg/dL — ABNORMAL LOW (ref 8.9–10.3)
Creatinine, Ser: 0.82 mg/dL (ref 0.44–1.00)
GFR calc Af Amer: >60 mL/min (ref >60)
GFR calc non Af Amer: >60 mL/min (ref >60)
GLUCOSE: 106 mg/dL — AB (ref 65–99)
POTASSIUM: 4.3 mmol/L (ref 3.5–5.1)
Sodium: 139 mmol/L (ref 135–145)

## 2016-12-28 LAB — CBC
HEMATOCRIT: 40.2 % (ref 36.0–46.0)
Hemoglobin: 12.9 g/dL (ref 12.0–15.0)
MCH: 27.6 pg (ref 26.0–34.0)
MCHC: 32.1 g/dL (ref 30.0–36.0)
MCV: 85.9 fL (ref 78.0–100.0)
Platelets: 297 10*3/uL (ref 150–400)
RBC: 4.68 MIL/uL (ref 3.87–5.11)
RDW: 14.1 % (ref 11.5–15.5)
WBC: 6 10*3/uL (ref 4.0–10.5)

## 2016-12-28 NOTE — Consult Note (Signed)
Neuropsychological Consultation  Patient:  Shannon Erickson   DOB: 12/03/1975  MR Number: [id number]  [facility] [address] [facility] [address] [id number] [place] [id number] Dept: [phone] Loc: [phone]  Start: 10:30 AM End: 11:30 AM  Provider/Observer:        Chief Complaint:     No chief complaint on file.   Reason For Service:     Shannon Erickson is a 41 year old female who was admitted on 12/03/2016 with severe right frontotemporal headache, slurred speech, lethargy and left-sided weakness. MRI revealed large right MCA territory nonhemorrhagic infarct involving the temporal lobe,  frontal lobe, insula, basal ganglia and regional white matter. The patient experienced resulting left-sided weakness, left inattention, and delayed processing speed and mental processing speed. Expressive language deficits primarily related to dysphagia. This does not appear to be language deficits related to primary language centers in the brain but more with motor control affecting articulation.  Testing Administered:   Brief Mini Mental Status Exam and the RBANS  Participation Level:   Active  Participation Quality:  Appropriate and Attentive      Behavioral Observation:  Well Groomed, Alert, and Appropriate.   Test Results:   The patient did well on orientation (x4) and general mental status.  Was able to recall three objects after delay.  Expressive language intact and improving articulation.  Constructional abilities good.    Immediate memory has improved and is now in the lower end of average range.  Issues with left visual neglect have been improving and only mild  issues with left neglect.  Language (expressive aphasia) has improved to near baseline.  She did not have a true non-fluent aphasia but more of a core motor issues with the left facial muscles.  Language centers in brain were not effected by the vascular  event.  Attention was in the lower end of average range and delayed memory was in the average range as well.    Summary of Results:   Overall, Shannon Erickson has had significant improvement in cognitive functioning.  She continues to have signficant hemiparesis of left side with greatest impact on left arm and hand.  While her affect is at times associated with frustration and depressive types of symptoms these do appear to be appropriate responses to her current situation. I do not think that she is dealing with a clinical depression at this point but this will need to be monitored over time especially after she is discharged and is facing real world challenge in the level of support is decreased. The patient's overall cognitive function appears to be returning to baseline with the exception of the psychomotor functions.  Impression/Diagnosis:   The patient is clearly continuing to have left side psychomotor deficits particularly focused on the left arm and hand. She also has left leg weakness. However, and issues related to visual neglect have been improving and she did well on line orientation task. While attention and concentration and memory functions do show some potential mild residual deficits at this point there are likely approaching baseline functioning.  Diagnosis:  Stroke due to right MCA, hemiparesis of left side   Arley PhenixJohn Rodenbough, Psy.D. Neuropsychologist

## 2016-12-28 NOTE — Progress Notes (Signed)
Occupational Therapy Session Note  Patient Details  Name: Shannon Erickson MRN: [id number] Date of Birth: 03-Apr-1976  Today's Date: 12/28/2016  Session 1 OT Individual Time: 0800-0900 OT Individual Time Calculation (min): 60 min   Session 2 OT Individual Time: 9150-4136 OT Individual Time Calculation (min): 31 min   Skilled Therapeutic Interventions/Progress Updates:  Session 1   Pt reports difficulty sleeping last night 2/2 restless LLE and pain. Pt came to sitting EOB with close supervision. Addressed L UE hypertonicity with gentle massage and stretching. Squat-pivot transfers wc>toilet with min A. Pt needed assistance for clothing management, but able to perform peri-care after voiding bladder. Shower level bathing completed with focus on functional use of L UE using wash mit. Modified dressing completed wc at the sink. Educated pt on other ways to use L UE tone a stabilizer and pt able to use L hand tone to hold pants open while threading R LE. Standing balance and L UE weight bearing to brush teeth and brush hair with facilitation at L knee. Pt with 2 lateral LOB requiring mod A to correct. Pt left seated in wc at end of session with L UE supported on lap tray.   Session 2  OT treatment session focused on L NMR and NMES.  1:1 NMES applied to supraspinatus and middle deltoid, and wrist extensors. Ratio: 1:1 Rate 35 pps Waveform- Asymmetric Ramp 1.0 Pulse 300 Intensity- 25 Duration -   15 mins No adverse reactions after treatment and is skin intact.   L UE NMR with joint input to bring pt through full ROM shoulder/elbow/wrist/hand. Pt left seated in wc with needs met and daughter present.   Therapy Documentation Precautions:  Precautions Precautions: Fall Precaution Comments: L hemiplegia Restrictions Weight Bearing Restrictions: No  See Function Navigator for Current Functional Status.   Therapy/Group: Individual Therapy  Valma Cava 12/28/2016, 8:57 PM

## 2016-12-28 NOTE — Progress Notes (Signed)
Speech Language Pathology Daily Session Note  Patient Details  Name: Shannon Erickson MRN: [id number] Date of Birth: 01/13/1976  Today's Date: 12/28/2016 SLP Concurrent Time: [id number] SLP Concurrent Time Calculation (min): 45 min   Short Term Goals: Week 3: SLP Short Term Goal 1 (Week 3): Pt will use external memory aids to recall new daily information with Supervision verbal cues.  SLP Short Term Goal 2 (Week 3): Pt will complete semi-complex problem solving tasks with Supervision verbal cues.  SLP Short Term Goal 3 (Week 3): Pt will scan to left of her environment with supervision verbal cues to locate objects during functional tasks.  SLP Short Term Goal 4 (Week 3): Pt will demonstrate anticipatory awareness by listing 3 activities that are safe for her to participate in at discharge with Supervision verbal cues.   Skilled Therapeutic Interventions: Skilled treatment focused on cognition goals. SLP facilitated session by providing supervision cues for semi-complex problem solving novel card game. Pt able to scan to left of environment with supervision cues. Pt was returned to room, left in care of daughter. Continue current plan of care.      Function:    Cognition Comprehension Comprehension assist level: Follows basic conversation/direction with no assist  Expression   Expression assist level: Expresses complex 90% of the time/cues < 10% of the time  Social Interaction Social Interaction assist level: Interacts appropriately 90% of the time - Needs monitoring or encouragement for participation or interaction.  Problem Solving Problem solving assist level: Solves basic problems with no assist  Memory Memory assist level: Recognizes or recalls 90% of the time/requires cueing < 10% of the time;More than reasonable amount of time    Pain    Therapy/Group: Individual Therapy  [name] 12/28/2016, 11:04 AM

## 2016-12-28 NOTE — Progress Notes (Signed)
Subjective/Complaints:  No issues overnite except left foot pain  ROS- Denies CP, SOB, N/V/D   Objective: Vital Signs: Blood pressure (!) 100/55, pulse 86, temperature 98.1 F (36.7 C), temperature source Oral, resp. rate 16, height 5' 3"  (1.6 m), weight 86.8 kg (191 lb 5.4 oz), SpO2 100 %. No results found. Results for orders placed or performed during the hospital encounter of 12/10/16 (from the past 72 hour(s))  Basic metabolic panel     Status: Abnormal   Collection Time: 12/28/16  4:49 AM  Result Value Ref Range   Sodium 139 135 - 145 mmol/L   Potassium 4.3 3.5 - 5.1 mmol/L   Chloride 110 101 - 111 mmol/L   CO2 21 (L) 22 - 32 mmol/L   Glucose, Bld 106 (H) 65 - 99 mg/dL   BUN 11 6 - 20 mg/dL   Creatinine, Ser 0.82 0.44 - 1.00 mg/dL   Calcium 8.3 (L) 8.9 - 10.3 mg/dL   GFR calc non Af Amer >60 >60 mL/min   GFR calc Af Amer >60 >60 mL/min    Comment: (NOTE) The eGFR has been calculated using the CKD EPI equation. This calculation has not been validated in all clinical situations. eGFR's persistently <60 mL/min signify possible Chronic Kidney Disease.    Anion gap 8 5 - 15  CBC     Status: None   Collection Time: 12/28/16  4:49 AM  Result Value Ref Range   WBC 6.0 4.0 - 10.5 K/uL   RBC 4.68 3.87 - 5.11 MIL/uL   Hemoglobin 12.9 12.0 - 15.0 g/dL   HCT 40.2 36.0 - 46.0 %   MCV 85.9 78.0 - 100.0 fL   MCH 27.6 26.0 - 34.0 pg   MCHC 32.1 30.0 - 36.0 g/dL   RDW 14.1 11.5 - 15.5 %   Platelets 297 150 - 400 K/uL     HEENT: Normocephalic, atraumatic Cardio: RRR and no JVD Resp: CTA B/L and unlabored GI: BS positive and ND Skin:   Intact. Warm and dry, ecchymosis from injection lower abd but no hematoma Neuro: Lethargic,  Motor: 4-/5 Left HF KE, 0/5 at ankle , tone MAS 2 at Left finger flexors 1 at elbow flexors 0/5 in LUE with spastic tone throughout Musc/Skel:  No edema, no tenderness Gen NAD. Vital signs reivewed  Assessment/Plan: 1. Functional deficits  secondary to Right MCA infarct which require 3+ hours per day of interdisciplinary therapy in a comprehensive inpatient rehab setting. Physiatrist is providing close team supervision and 24 hour management of active medical problems listed below. Physiatrist and rehab team continue to assess barriers to discharge/monitor patient progress toward functional and medical goals. FIM: Function - Bathing Position: Shower Body parts bathed by patient: Left arm, Chest, Abdomen, Front perineal area, Right upper leg, Left upper leg, Right lower leg, Left lower leg Body parts bathed by helper: Right arm, Buttocks, Back Assist Level: Touching or steadying assistance(Pt > 75%)  Function- Upper Body Dressing/Undressing What is the patient wearing?: Pull over shirt/dress, Bra Bra - Perfomed by patient: Thread/unthread left bra strap, Thread/unthread right bra strap Bra - Perfomed by helper: Hook/unhook bra (pull down sports bra) Pull over shirt/dress - Perfomed by patient: Thread/unthread right sleeve, Thread/unthread left sleeve, Put head through opening, Pull shirt over trunk Pull over shirt/dress - Perfomed by helper: Thread/unthread left sleeve, Pull shirt over trunk Assist Level: Touching or steadying assistance(Pt > 75%) Function - Lower Body Dressing/Undressing What is the patient wearing?: Underwear, Pants, Socks, Shoes, AFO  Position: Wheelchair/chair at sink Underwear - Performed by patient: Thread/unthread right underwear leg, Thread/unthread left underwear leg Underwear - Performed by helper: Pull underwear up/down Pants- Performed by patient: Thread/unthread right pants leg, Thread/unthread left pants leg Pants- Performed by helper: Pull pants up/down Non-skid slipper socks- Performed by helper: Don/doff right sock, Don/doff left sock Socks - Performed by patient: Don/doff right sock Socks - Performed by helper: Don/doff left sock Shoes - Performed by patient: Don/doff right shoe, Fasten  right Shoes - Performed by helper: Don/doff left shoe, Fasten left AFO - Performed by helper: Don/doff left AFO Assist for footwear: Partial/moderate assist Assist for lower body dressing: Touching or steadying assistance (Pt > 75%)  Function - Toileting Toileting activity did not occur: No continent bowel/bladder event Toileting steps completed by patient: Performs perineal hygiene Toileting steps completed by helper: Adjust clothing prior to toileting, Performs perineal hygiene, Adjust clothing after toileting Toileting Assistive Devices: Grab bar or rail Assist level: Touching or steadying assistance (Pt.75%)  Function - Air cabin crew transfer assistive device: Grab bar Assist level to toilet: Touching or steadying assistance (Pt > 75%) Assist level from toilet: Touching or steadying assistance (Pt > 75%)  Function - Chair/bed transfer Chair/bed transfer method: Squat pivot Chair/bed transfer assist level: Touching or steadying assistance (Pt > 75%) Chair/bed transfer assistive device: Armrests Chair/bed transfer details: Verbal cues for precautions/safety, Tactile cues for weight shifting, Verbal cues for sequencing, Verbal cues for technique  Function - Locomotion: Wheelchair Will patient use wheelchair at discharge?:  (TBD) Type: Manual Max wheelchair distance: 157f Assist Level: Supervision or verbal cues Assist Level: Supervision or verbal cues Assist Level: Supervision or verbal cues Turns around,maneuvers to table,bed, and toilet,negotiates 3% grade,maneuvers on rugs and over doorsills: No Function - Locomotion: Ambulation Assistive device: Parallel bars Max distance: 189fAssist level: Moderate assist (Pt 50 - 74%) Assist level: Moderate assist (Pt 50 - 74%) Walk 50 feet with 2 turns activity did not occur: Safety/medical concerns Assist level: Maximal assist (Pt 25 - 49%) Walk 150 feet activity did not occur: Safety/medical concerns Walk 10 feet on  uneven surfaces activity did not occur: Safety/medical concerns  Function - Comprehension Comprehension: Auditory Comprehension assist level: Understands basic 90% of the time/cues < 10% of the time  Function - Expression Expression: Verbal Expression assist level: Expresses basic 90% of the time/requires cueing < 10% of the time.  Function - Social Interaction Social Interaction assist level: Interacts appropriately 90% of the time - Needs monitoring or encouragement for participation or interaction.  Function - Problem Solving Problem solving assist level: Solves basic 90% of the time/requires cueing < 10% of the time  Function - Memory Memory assist level: Recognizes or recalls 90% of the time/requires cueing < 10% of the time Patient normally able to recall (first 3 days only): Current season, That he or she is in a hospital, Staff names and faces, Location of own room   Medical Problem List and Plan: 1. Left hemiparesis secondary to Right MCA infarct  ASA and Plavix  Cont CIR   PT,OT, SLP-Team conference today please see physician documentation under team conference tab, met with team face-to-face to discuss problems,progress, and goals. Formulized individual treatment plan based on medical history, underlying problem and comorbidities.         2. DVT Prophylaxis/Anticoagulation: Pharmaceutical: Lovenox - pt c/o excessive bruising, no hematoma formation, discussed alternative treatment of SCD and lower efficacy of DVT prevention, pt and her daughter wish to proceed with this  3. Migraine Headaches/Pain Management: has been getting Toradol since 7/14. On low dose topamax-. Sensory dysesthesia LLE improved 4. Mood: LCSW to follow for evaluation and support.  5. Neuropsych: This patient is not fullycapable of making decisions on herown behalf. 6. Skin/Wound Care: routine pressure relief measures  7. Fluids/Electrolytes/Nutrition: Liquids advanced to thins Monitor I/O.  Low alb  will supplement with prostat  BMP normal on 8/8   Protein supplement changed due to pt preference 8. Dyslipidemia: On Lipitor. 9. Thyroid nodule: follow up on outpatient basis 10. Obesity: Educate on importance of weight loss with appropriate diet to promote health and mobility.  11. Question antiphospholipid syndrome: high Beta 2-glycoprotein IgG. Will need recheck labs in 12 weeks. 12.  Spasticity LUE and LLE- improving  resting hand splint   Cont tizanidine 16m qhs, no spasm which disturb sleep 13.  Constipation- last BM 8/7 l miralax  15.  Urinary incont-still using diaper LOS (Days) 18 A FACE TO FACE EVALUATION WAS PERFORMED  KIRSTEINS,ANDREW E 12/28/2016, 7:34 AM

## 2016-12-29 ENCOUNTER — Inpatient Hospital Stay (HOSPITAL_COMMUNITY): Payer: Medicaid Other | Admitting: Physical Therapy

## 2016-12-29 ENCOUNTER — Inpatient Hospital Stay (HOSPITAL_COMMUNITY): Payer: Medicaid Other | Admitting: Speech Pathology

## 2016-12-29 ENCOUNTER — Inpatient Hospital Stay (HOSPITAL_COMMUNITY): Payer: Self-pay | Admitting: Speech Pathology

## 2016-12-29 NOTE — Progress Notes (Signed)
Subjective/Complaints:  No issues overnite , no foot or Left shoulder pain  ROS- Denies CP, SOB, N/V/D   Objective: Vital Signs: Blood pressure (!) 96/48, pulse 79, temperature 98.4 F (36.9 C), temperature source Oral, resp. rate 17, height 5' 3"  (1.6 m), weight 86.8 kg (191 lb 5.4 oz), SpO2 100 %. No results found. Results for orders placed or performed during the hospital encounter of 12/10/16 (from the past 72 hour(s))  Basic metabolic panel     Status: Abnormal   Collection Time: 12/28/16  4:49 AM  Result Value Ref Range   Sodium 139 135 - 145 mmol/L   Potassium 4.3 3.5 - 5.1 mmol/L   Chloride 110 101 - 111 mmol/L   CO2 21 (L) 22 - 32 mmol/L   Glucose, Bld 106 (H) 65 - 99 mg/dL   BUN 11 6 - 20 mg/dL   Creatinine, Ser 0.82 0.44 - 1.00 mg/dL   Calcium 8.3 (L) 8.9 - 10.3 mg/dL   GFR calc non Af Amer >60 >60 mL/min   GFR calc Af Amer >60 >60 mL/min    Comment: (NOTE) The eGFR has been calculated using the CKD EPI equation. This calculation has not been validated in all clinical situations. eGFR's persistently <60 mL/min signify possible Chronic Kidney Disease.    Anion gap 8 5 - 15  CBC     Status: None   Collection Time: 12/28/16  4:49 AM  Result Value Ref Range   WBC 6.0 4.0 - 10.5 K/uL   RBC 4.68 3.87 - 5.11 MIL/uL   Hemoglobin 12.9 12.0 - 15.0 g/dL   HCT 40.2 36.0 - 46.0 %   MCV 85.9 78.0 - 100.0 fL   MCH 27.6 26.0 - 34.0 pg   MCHC 32.1 30.0 - 36.0 g/dL   RDW 14.1 11.5 - 15.5 %   Platelets 297 150 - 400 K/uL     HEENT: Normocephalic, atraumatic Cardio: RRR and no JVD Resp: CTA B/L and unlabored GI: BS positive and ND Skin:   Intact. Warm and dry, ecchymosis from injection lower abd but no hematoma Neuro: Lethargic,  Motor: 4-/5 Left HF KE, 0/5 at ankle , tone MAS 2 at Left finger flexors 1 at elbow flexors 0/5 in LUE with spastic tone throughout Musc/Skel:  No edema, no tenderness Gen NAD. Vital signs reivewed  Assessment/Plan: 1. Functional deficits  secondary to Right MCA infarct which require 3+ hours per day of interdisciplinary therapy in a comprehensive inpatient rehab setting. Physiatrist is providing close team supervision and 24 hour management of active medical problems listed below. Physiatrist and rehab team continue to assess barriers to discharge/monitor patient progress toward functional and medical goals. FIM: Function - Bathing Position: Shower Body parts bathed by patient: Left arm, Chest, Abdomen, Front perineal area, Right upper leg, Left upper leg, Right lower leg, Left lower leg Body parts bathed by helper: Right arm, Buttocks, Back Assist Level: Touching or steadying assistance(Pt > 75%)  Function- Upper Body Dressing/Undressing What is the patient wearing?: Pull over shirt/dress, Bra Bra - Perfomed by patient: Thread/unthread left bra strap, Thread/unthread right bra strap Bra - Perfomed by helper: Hook/unhook bra (pull down sports bra) Pull over shirt/dress - Perfomed by patient: Thread/unthread right sleeve, Thread/unthread left sleeve, Put head through opening, Pull shirt over trunk Pull over shirt/dress - Perfomed by helper: Thread/unthread left sleeve, Pull shirt over trunk Assist Level: Touching or steadying assistance(Pt > 75%) Function - Lower Body Dressing/Undressing What is the patient wearing?: Underwear, Pants,  Socks, Shoes, AFO Position: Education officer, museum at [employer] - Performed by patient: Thread/unthread right underwear leg, Thread/unthread left underwear leg Underwear - Performed by helper: Pull underwear up/down Pants- Performed by patient: Thread/unthread right pants leg, Thread/unthread left pants leg Pants- Performed by helper: Pull pants up/down Non-skid slipper socks- Performed by helper: Don/doff right sock, Don/doff left sock Socks - Performed by patient: Don/doff right sock Socks - Performed by helper: Don/doff left sock Shoes - Performed by patient: Don/doff right shoe, Fasten  right Shoes - Performed by helper: Don/doff left shoe, Fasten left AFO - Performed by helper: Don/doff left AFO Assist for footwear: Partial/moderate assist Assist for lower body dressing: Touching or steadying assistance (Pt > 75%)  Function - Toileting Toileting activity did not occur: No continent bowel/bladder event Toileting steps completed by patient: Performs perineal hygiene Toileting steps completed by helper: Adjust clothing prior to toileting, Performs perineal hygiene, Adjust clothing after toileting Toileting Assistive Devices: Grab bar or rail Assist level: Touching or steadying assistance (Pt.75%)  Function - Air cabin crew transfer assistive device: Grab bar Assist level to toilet: Touching or steadying assistance (Pt > 75%) Assist level from toilet: Touching or steadying assistance (Pt > 75%)  Function - Chair/bed transfer Chair/bed transfer method: Stand pivot Chair/bed transfer assist level: Moderate assist (Pt 50 - 74%/lift or lower) Chair/bed transfer assistive device: Armrests Chair/bed transfer details: Verbal cues for precautions/safety, Verbal cues for sequencing  Function - Locomotion: Wheelchair Will patient use wheelchair at discharge?:  (TBD) Type: Manual Max wheelchair distance: 143f Assist Level: Supervision or verbal cues Assist Level: Supervision or verbal cues Assist Level: Supervision or verbal cues Turns around,maneuvers to table,bed, and toilet,negotiates 3% grade,maneuvers on rugs and over doorsills: No Function - Locomotion: Ambulation Assistive device: Parallel bars Max distance: 196fAssist level: Moderate assist (Pt 50 - 74%) Assist level: Moderate assist (Pt 50 - 74%) Walk 50 feet with 2 turns activity did not occur: Safety/medical concerns Assist level: Maximal assist (Pt 25 - 49%) Walk 150 feet activity did not occur: Safety/medical concerns Walk 10 feet on uneven surfaces activity did not occur: Safety/medical  concerns  Function - Comprehension Comprehension: Auditory Comprehension assist level: Understands basic 90% of the time/cues < 10% of the time  Function - Expression Expression: Verbal Expression assist level: Expresses basic 90% of the time/requires cueing < 10% of the time.  Function - Social Interaction Social Interaction assist level: Interacts appropriately 90% of the time - Needs monitoring or encouragement for participation or interaction.  Function - Problem Solving Problem solving assist level: Solves basic 90% of the time/requires cueing < 10% of the time  Function - Memory Memory assist level: Recognizes or recalls 90% of the time/requires cueing < 10% of the time Patient normally able to recall (first 3 days only): Current season, That he or she is in a hospital, Staff names and faces, Location of own room   Medical Problem List and Plan: 1. Left hemiparesis secondary to Right MCA infarct  ASA and Plavix  Cont CIR   PT,OT, SLP-       2. DVT Prophylaxis/Anticoagulation: Pharmaceutical: Lovenox - pt c/o excessive bruising, no hematoma formation, discussed alternative treatment of SCD and lower efficacy of DVT prevention, pt and her daughter wish to proceed with this  3. Migraine Headaches/Pain Management: has been getting Toradol since 7/14. On low dose topamax-. Sensory dysesthesia LLE improved 4. Mood: LCSW to follow for evaluation and support.  5. Neuropsych: This patient is not fullycapable of making  decisions on herown behalf. 6. Skin/Wound Care: routine pressure relief measures  7. Fluids/Electrolytes/Nutrition: Liquids advanced to thins Monitor I/O.  Low alb will supplement with prostat  BMP normal on 8/8   Protein supplement changed due to pt preference 8. Dyslipidemia: On Lipitor. 9. Thyroid nodule: follow up on outpatient basis 10. Obesity: Educate on importance of weight loss with appropriate diet to promote health and mobility.  11. Question  antiphospholipid syndrome: high Beta 2-glycoprotein IgG. Will need recheck labs in 12 weeks. 12.  Spasticity LUE and LLE- improving  resting hand splint   Cont tizanidine 23m qhs, no spasm which disturb sleep 13.  Constipation- last BM 8/8 cont miralax  15.  Urinary incont-still using diaper, toileting program   [place]  Annica Marinello E 12/29/2016, 7:34 AM

## 2016-12-29 NOTE — Progress Notes (Signed)
Speech Language Pathology Daily Session Note  Patient Details  Name: Shannon Erickson MRN: [id number] Date of Birth: 07/09/1975  Today's Date: 12/29/2016 SLP Individual Time: 1045-1130 SLP Individual Time Calculation (min): 45 min  Short Term Goals: Week 3: SLP Short Term Goal 1 (Week 3): Pt will use external memory aids to recall new daily information with Supervision verbal cues.  SLP Short Term Goal 2 (Week 3): Pt will complete semi-complex problem solving tasks with Supervision verbal cues.  SLP Short Term Goal 3 (Week 3): Pt will scan to left of her environment with supervision verbal cues to locate objects during functional tasks.  SLP Short Term Goal 4 (Week 3): Pt will demonstrate anticipatory awareness by listing 3 activities that are safe for her to participate in at discharge with Supervision verbal cues.   Skilled Therapeutic Interventions: Skilled treatment session focused on cognition goals. SLP facilitated session by providing Max A to learn novel card game. Game very complex and unable to be broken into basic steps. Game discontinued. Pt able to recall previously learned card game and required Mod A to play and problem solve turns. Pt also required Mod A to solve simple deductive reasoning puzzle. Pt left in care of daughter. Continue per current plan of care.      Function:  Eating Eating   Modified Consistency Diet: No Eating Assist Level: More than reasonable amount of time   Eating Set Up Assist For: Opening containers       Cognition Comprehension Comprehension assist level: Understands basic 90% of the time/cues < 10% of the time;Follows basic conversation/direction with extra time/assistive device  Expression   Expression assist level: Expresses basic 90% of the time/requires cueing < 10% of the time.  Social Interaction Social Interaction assist level: Interacts appropriately 90% of the time - Needs monitoring or encouragement for participation or interaction.   Problem Solving Problem solving assist level: Solves basic 90% of the time/requires cueing < 10% of the time  Memory Memory assist level: Recognizes or recalls 90% of the time/requires cueing < 10% of the time    Pain Pain Assessment Pain Score: 0-No pain  Therapy/Group: Individual Therapy  Bryli Mantey 12/29/2016, 12:34 PM

## 2016-12-29 NOTE — Progress Notes (Addendum)
I have read and agree with the below documentation.  Aleda GranaVictoria Lashonta Pilling, PT, DPT    Physical Therapy Session Note  Patient Details  Name: Shannon DiceCynthia Mcnutt MRN: [id number] Date of Birth: 11/25/1975  Today's Date: 12/28/2016 PT Individual Time: 1305 - 1404 PT Individual Time Calculation (min): 59  Short Term Goals: Week 3:  PT Short Term Goal 1 (Week 3): Pt will ambulate 15250ft with mod A with LRAD PT Short Term Goal 2 (Week 3): Pt will perform bed mobility without bed rails and without HOB elevated with min A PT Short Term Goal 3 (Week 3): Pt will ascend/descend 12 stairs with mod A  Skilled Therapeutic Interventions/Progress Updates:    Pt reports having an "aching" pain in her L LE from the knee down last night, but does not currently have any pain.  PT treatment session focused on trunk NMR, LE strengthening, sitting balance, and L LE NMR during functional activities.  Pt sitting in w/c with daughter present upon arrival, pt agreeable to PT. PT rolled pt to gym for energy conservation. Squat pivot w/c <>  mat with min guard A for safety. Sitting EOM performing UE reaching task outside BOS focused on trunk control with supervision and occasional min A for balance. One instance of L LOB in sitting requiring max A to return to midline. Sitting EOM lateral trunk leans to R and L onto elbow x10 each focused on coming back to midline with supervision to R and min A to L to return to upright posture. Sit to supine supervision for safety. Supine single L LE bridges x10 with tactile cues to maintain hip neutral (to prevent external rotation) and tactile cues at L glute for hip extension. Supine L LE heel slides x10 with verbal and tactile cues for knee flexion working on L LE NMR of hip flexion combined with knee flexion. Supine to sit with mod A for forward trunk weight shift. Ambulated in parallel bars with B UE support, daughter supporting L UE on bar, with min assist for balance. PT provided verbal and  tactile cues for upright posture, for L LE rotation to prevent excessive hip external rotation and for L weight shift. Pt had genu recurvatum of L knee during stance phase of gait and PT placed heel wedge in shoe with no recurvatum present during gait. Pt ambulated 1150ft from therapy gym to room with hemiwalker and mod A for balance and PT providing tactile cues for L weight shift. Pt left in w/c in room with daughter present.   Therapy Documentation Precautions:  Precautions Precautions: Fall Precaution Comments: L hemiplegia Restrictions Weight Bearing Restrictions: No  See Function Navigator for Current Functional Status.   Therapy/Group: Individual Therapy  Carly Pippin 12/29/2016, 8:55 AM

## 2016-12-29 NOTE — Patient Care Conference (Signed)
Inpatient RehabilitationTeam Conference and Plan of Care Update Date: 12/28/2016   Time: 11:00 AM    Patient Name: Shannon Erickson      Medical Record Number: [id number]  Date of Birth: July 28, 1975 Sex: Female         Room/Bed: 4M04C/4M04C-01 Payor Info: Payor: MEDICAID POTENTIAL / Plan: MEDICAID POTENTIAL / Product Type: *No Product type* /    Admitting Diagnosis: Right CVA  Admit Date/Time:  12/10/2016  4:39 PM Admission Comments: No comment available   Primary Diagnosis:  Hemiparesis affecting left side as late effect of cerebrovascular accident [facility]) Principal Problem: Hemiparesis affecting left side as late effect of cerebrovascular accident [facility])  Patient Active Problem List   Diagnosis Date Noted  . Spastic hemiparesis ([place])   . Acute rhinitis   . Slow transit constipation   . 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 Gait disturbance, post-stroke 12/10/2016  . Hemiparesis affecting left side as late effect of cerebrovascular accident ([place])   . Left-sided visual neglect   . Antiphospholipid syndrome ([place])   . Pain   . Prediabetes   . Dysphagia, post-stroke   . Carotid atherosclerosis, bilateral   . Thyroid nodule   . Hyperlipidemia   . Chronic migraine without aura without status migrainosus, not intractable   . Headache 12/03/2016  . Hypokalemia 12/03/2016  . Stroke (cerebrum) ([place]) 12/03/2016  . Left-sided weakness     Expected Discharge Date: Expected Discharge Date: 01/14/17  Team Members Present: Physician leading conference: Dr. Alysia Penna Social Worker Present: Alfonse Alpers, LCSW Nurse Present: Isla Pence, RN PT Present: Leavy Cella, PT OT Present: Cherylynn Ridges, OT SLP Present: Stormy Fabian, SLP PPS Coordinator present : Daiva Nakayama, RN, CRRN     Current Status/Progress Goal Weekly Team Focus  Medical   incontinence, improving with bowel, still occasional bladder incontinence, no syncopal episodes, diarrhea,  resolved, increasing spasticity  achieved. Bowel and bladder continence, reduce spasticity  plasticity management may need Botox   Bowel/Bladder   cont of b/b; lbm 8/7  maintain continence during rehab stay  assess for changes   Swallow/Nutrition/ Hydration   Goals met         ADL's   min A squat-pivot transfers, min/Mod A bathing/dressing/toileting  Min A/supervision  Pt/family ed, dc planning, trunk control, standing balance, L NMR   Mobility   min A bed mobility, min A squat pivot transfers, mod-max A ambulation, mod A stairs  supervision bed mobility, min A ambulation and stairs, supervision w/c  L LE NMR, trunk NMR, dynamic sitting balance, midline orientaiton in standing, pre-gait and ambulation   Communication   Goals met         Safety/Cognition/ Behavioral Observations  Min - Mod A  Supervision  Awareness, problem sovling, memory   Pain      less than 3 out of 10  assess pain q shift and prn   Skin   CDI  skin free of infection or breakdown during rehab stay  assess skin q shift and prn    Rehab Goals Patient on target to meet rehab goals: Yes Rehab Goals Revised: none *See Care Plan and progress notes for long and short-term goals.     Barriers to Discharge  Current Status/Progress Possible Resolutions Date Resolved   Physician    Inaccessible home environment;Incontinence     continue inpatient rehabilitation, educate caregivers, medication management above, progressing towards goal  see above      Nursing  PT  Home environment access/layout  not sure where pt will d/c to, L LE and UE tone              OT                  SLP                SW                Discharge Planning/Teaching Needs:  Pt plans to go home with her 41 y/o dtr and 36 y/o son who will be with her to provide assistance.  Dtr is prepared to stay in [place] with pt for up to a year, if need be.  She is looking for new place for pt to live.  Pt's family will be offered family  education closer to d/c, but dtr is present for therapies daily.   Team Discussion:  MD stated that pt had foot pain and he feels she will need an AFO.  Pt is incontinent at night, but this is improving, too.  Pt is refusing Miralax and protein supplement.  Pt is doing well in therapy and is min A for squat pivot txs.  She still has tone in her left arm and Dr. Letta Pate will consider botox as a treatment.  Pt's standing balance is better and is at supervision for toileting due to improving trunk control.  Pt is mod A with ambulation and min A with bed mobility.  Pt is min A for cognition, but this is improving also and she is on a regular diet.  Revisions to Treatment Plan:  none    Continued Need for Acute Rehabilitation Level of Care: The patient requires daily medical management by a physician with specialized training in physical medicine and rehabilitation for the following conditions: Daily direction of a multidisciplinary physical rehabilitation program to ensure safe treatment while eliciting the highest outcome that is of practical value to the patient.: Yes Daily medical management of patient stability for increased activity during participation in an intensive rehabilitation regime.: Yes Daily analysis of laboratory values and/or radiology reports with any subsequent need for medication adjustment of medical intervention for : Neurological problems;Urological problems  [name], [name] 12/30/2016, 8:40 AM

## 2016-12-29 NOTE — Progress Notes (Signed)
Physical Therapy Session Note  Patient Details  Name: Shannon Erickson MRN: [id number] Date of Birth: 07/05/1975  Today's Date: 12/29/2016 PT Individual Time: [id number] PT Individual Time Calculation (min): 54 min   Short Term Goals: Week 3:  PT Short Term Goal 1 (Week 3): Pt will ambulate 17150ft with mod A with LRAD PT Short Term Goal 2 (Week 3): Pt will perform bed mobility without bed rails and without HOB elevated with min A PT Short Term Goal 3 (Week 3): Pt will ascend/descend 12 stairs with mod A  Skilled Therapeutic Interventions/Progress Updates:    no c/o pain. PT treatment session focused on NMR during gait.  Pt on toilet with daughter and nurse tech present upon arrival. Stand pivot transfer toilet to w/c with daughter providing mod assist for pivoting. Pt agreeable to PT session. Daughter rolled pt to/from therapy gym for energy conservation. Sit to stands throughout session with min assist for balance upon standing. Ambulated in parallel bars 3 times, sitting rests between, with B UE support, daughter assisting with L UE placement on bar and PT providing min-mod A for balance with tactile cues for L weight shift and verbal cues for upright posture. Standing with R UE support on hallway rail pt performed R LE step forward/backward with mod A and facilitation for L weight shift to increase L LE weight bearing. Pt noted to have increased L LE tone compared to previous session. Ambulated ~ 5120ft x 3 with + 2 assist (pt doing 40%) progressing to max assist of 1 with pt doing 50%. PT provided mirror feedback with facilitation for L weight shift and placement of L LE during stance phase and verbal cues for upright posture. Pt returned to room in w/c with daughter present.   Therapy Documentation Precautions:  Precautions Precautions: Fall Precaution Comments: L hemiplegia Restrictions Weight Bearing Restrictions: No   See Function Navigator for Current Functional  Status.   Therapy/Group: Individual Therapy  Dmitry Macomber 12/29/2016, 3:51 PM

## 2016-12-29 NOTE — Progress Notes (Signed)
Social Work Patient ID: Shannon Erickson, female   DOB: [birth year], 41 y.o.   MRN: [id number]   CSW met with pt and her dtr to update them on team conference discussion and to let them know that 01-14-17 is still pt's targeted d/c date.  Pt is brighter this week and reports feeling more like herself cognitively.  They are also seeing physical progression.  Dtr has paperwork that financial counselor needs to complete Medicaid application.  CSW has tried to connect Development worker, community to dtr/pt.  She is to come to pt's room to get documents.  CSW also trying to assist pt/dtr in pt getting a duplicate license.  Dtr is continuing to work on a place for she, pt, and pt's 22 y/o son to live.  CSW to continue to follow and assist as needed.

## 2016-12-29 NOTE — Progress Notes (Signed)
Occupational Therapy Session Note  Patient Details  Name: Shannon Erickson MRN: [id number] Date of Birth: 1975-11-10  Today's Date: 12/29/2016  Session 1 OT Individual Time: 0900-1000 OT Individual Time Calculation (min): 60 min   Session 2 OT Individual Time: 1330-1400 OT Individual Time Calculation (min): 30 min    Short Term Goals: Week 3:  OT Short Term Goal 1 (Week 3): Pt will demonstrate improved LUE body awareness by using R hand to position L arm prior to transfers. OT Short Term Goal 2 (Week 3): Pt will complete shower transfers with consistent supervision and out of shower OT Short Term Goal 3 (Week 3): Pt will don AFO with min A OT Short Term Goal 4 (Week 3): Pt will thread both pant legs with min A  Skilled Therapeutic Interventions/Progress Updates:  Session 1   Pt greeted on [facility] with handoff from RN. Stand-pivot to wc with min A. Pt ate breakfast seated in wc with education provided for ways to use L UE as a stabilizer. Provided gentle ROM and stretching to L UE. Stand-pivot into shower w/ min A, bathing completed with assistance to don wash mit, then hand-over hand A to integrate L UE into bathing tasks. Addressed standing balance without UE support to reach and wash buttocks with Min/Mod A. Facilitated L LE and L UE weight bearing through standing tasks at the sink. Pt left seated in wc at end of session with needs met and call bell in reach.   Session 2                                                                                                              OT treatment session focused on L UE NMR. Pt brought into gravity eliminated side lying position. Shoulder flex/ext/elevation/depression/abd/add, scapula protraction/retraction, elbow flex/ext, wrist flex/ext, with joint input provided to bring pt through full ROM. Provided therapeutic massage to L pectoral working down through hand to decrease tone. Pt then donned socks in bed with min A, transferred to sitting to don  shoes but required mod A to don L AFO. Pt transferred to wc stand-pivot w. Mod A and left seated in wc with daughter present.                                                 Therapy Documentation Precautions:  Precautions Precautions: Fall Precaution Comments: L hemiplegia Restrictions Weight Bearing Restrictions: No Pain:   none/denies pain  See Function Navigator for Current Functional Status.   Therapy/Group: Individual Therapy  Valma Cava 12/29/2016, 2:11 PM

## 2016-12-30 ENCOUNTER — Inpatient Hospital Stay (HOSPITAL_COMMUNITY): Payer: Self-pay | Admitting: Occupational Therapy

## 2016-12-30 ENCOUNTER — Inpatient Hospital Stay (HOSPITAL_COMMUNITY): Payer: Self-pay | Admitting: Physical Therapy

## 2016-12-30 ENCOUNTER — Inpatient Hospital Stay (HOSPITAL_COMMUNITY): Payer: Self-pay | Admitting: Speech Pathology

## 2016-12-30 ENCOUNTER — Inpatient Hospital Stay (HOSPITAL_COMMUNITY): Payer: Medicaid Other | Admitting: Physical Therapy

## 2016-12-30 NOTE — Plan of Care (Signed)
Problem: Food- and Nutrition-Related Knowledge Deficit (NB-1.1) Goal: Nutrition education Formal process to instruct or train a patient/client in a skill or to impart knowledge to help patients/clients voluntarily manage or modify food choices and eating behavior to maintain or improve health. Outcome: Completed/Met Date Met: 12/30/16 Nutrition Education Note  RD consulted for nutrition diet education.   RD provided "Heart Healthy Nutrition Therapy" handout from the Academy of [employer]. Reviewed patient's dietary recall. Provided examples on ways to decrease sodium and fat intake in diet. Discouraged intake of processed foods and use of salt shaker. Encouraged fresh fruits and vegetables as well as whole grain sources of carbohydrates to maximize fiber intake. Teach back method used.  Expect good compliance.  Body mass index is 33.89 kg/m. Pt meets criteria for class I obesity based on current BMI.  Current diet order is heart healthy/carbohydrate modified, patient is consuming approximately 80% of meals at this time. Pt reports having a good appetite currently and PTA with no other difficulties. Labs and medications reviewed. No further nutrition interventions warranted at this time. RD contact information provided. If additional nutrition issues arise, please re-consult RD.  Shannon Parker, MS, RD, LDN Pager # [phone] After hours/ weekend pager # [phone]

## 2016-12-30 NOTE — Progress Notes (Signed)
Physical Therapy Session Note  Patient Details  Name: Shannon Erickson MRN: [id number] Date of Birth: 09/04/1975  Today's Date: 12/30/2016 PT Individual Time: 1345-1500 PT Individual Time Calculation (min): 75 min   Short Term Goals: Week 3:  PT Short Term Goal 1 (Week 3): Pt will ambulate 1150ft with mod A with LRAD PT Short Term Goal 2 (Week 3): Pt will perform bed mobility without bed rails and without HOB elevated with min A PT Short Term Goal 3 (Week 3): Pt will ascend/descend 12 stairs with mod A  Skilled Therapeutic Interventions/Progress Updates:    no c/o pain. PT treatment session focused on trunk control, sitting balance, L LE NMR, standing balance, and gait.  Pt sitting in w/c upon arrival with daughter present, agreeable to PT session. Daughter present for entire session. Rolled pt to gym in w/c for energy conservation. Squat pivot transfer w/c <> mat with supervision to R and min guard to L throughout session. Sitting balance with UE reaching task with close supervision for safety working on trunk control. Standing kinetron with R UE support 2 times to fatigue with mod A for balance and upright posture with mirror feedback. PT provided verbal cues and tactile facilitation for weight shifting and upright posture with pt noted improved weight shifting during second trial. Attempted STS with mirror feedback and R LE blocked on step with max A for lifting and standing balance changed to R LE blocked by therapist's foot with max A for lifting and standing balance but improved weight shifting to L LE. Ambulated 8480ft, 3790ft, and 850ft with hemiwalker and max A progressing to mod A for balance with facilitation for L weight shifting and verbal cues for increased and equal bilateral step length and to bring shoulders to midline. Ambulated to bed and performed a stand pivot with hemiwalker and min A for balance and verbal cues for sequencing and for safety. Squat pivot to R bed to w/c with close  supervision for safety. Left sitting in w/c with daughter present.   Therapy Documentation Precautions:  Precautions Precautions: Fall Precaution Comments: L hemiplegia Restrictions Weight Bearing Restrictions: No   See Function Navigator for Current Functional Status.   Therapy/Group: Individual Therapy  [name] 12/30/2016, 4:21 PM

## 2016-12-30 NOTE — Progress Notes (Signed)
Occupational Therapy Session Note  Patient Details  Name: Shannon Erickson MRN: [id number] Date of Birth: [birth year]  Today's Date: 12/30/2016 OT Individual Time: 0802-0900 OT Individual Time Calculation (min): 58 min   Short Term Goals: Week 3:  OT Short Term Goal 1 (Week 3): Pt will demonstrate improved LUE body awareness by using R hand to position L arm prior to transfers. OT Short Term Goal 2 (Week 3): Pt will complete shower transfers with consistent supervision and out of shower OT Short Term Goal 3 (Week 3): Pt will don AFO with min A OT Short Term Goal 4 (Week 3): Pt will thread both pant legs with min A  Skilled Therapeutic Interventions/Progress Updates:    OT treatment session focused on modified bathing/dressing, functional use of L UE, L UE tone, and one-handed modifications. Suo<>sit with supervision, stand-pivot transfer bed>wc>tub bench w/ Min guard A.  Provided pt with long-handled sponge and educated on using L UE to stabilize while applying soap. Pt demonstrated understanding and was able to access LB and back with increased independence. Dressing completed bed level today to work on modified strategies for increased independence. Pt able to achieve figure 4 position in supine to thread pant legs. Worked on hip bridges to pull up pants with min A to support LLE during bridging and min A to pull up L side. Pt also able to don socks in this position with min cues for technique. Provided pt with dysom for increased independence with self-feeding. Provided therapeutic massage and gentle ROM to L UE while pt finished eating breakfast. Pt able to keep L UE on tray table by the end of session 2/2 decresaed flexor tone. Pt left sitting in wc with needs met.   Therapy Documentation Precautions:  Precautions Precautions: Fall Precaution Comments: L hemiplegia Restrictions Weight Bearing Restrictions: No Pain:  none/denies pain  See Function Navigator for Current Functional  Status.   Therapy/Group: Individual Therapy  Valma Cava 12/30/2016, 8:24 AM

## 2016-12-30 NOTE — Progress Notes (Signed)
Subjective/Complaints:  No issues overnite , no foot or Left shoulder pain  ROS- Denies CP, SOB, N/V/D   Objective: Vital Signs: Blood pressure 103/61, pulse 86, temperature 98.6 F (37 C), temperature source Oral, resp. rate 18, height 5' 3"  (1.6 m), weight 86.8 kg (191 lb 5.4 oz), SpO2 99 %. No results found. Results for orders placed or performed during the hospital encounter of 12/10/16 (from the past 72 hour(s))  Basic metabolic panel     Status: Abnormal   Collection Time: 12/28/16  4:49 AM  Result Value Ref Range   Sodium 139 135 - 145 mmol/L   Potassium 4.3 3.5 - 5.1 mmol/L   Chloride 110 101 - 111 mmol/L   CO2 21 (L) 22 - 32 mmol/L   Glucose, Bld 106 (H) 65 - 99 mg/dL   BUN 11 6 - 20 mg/dL   Creatinine, Ser 0.82 0.44 - 1.00 mg/dL   Calcium 8.3 (L) 8.9 - 10.3 mg/dL   GFR calc non Af Amer >60 >60 mL/min   GFR calc Af Amer >60 >60 mL/min    Comment: (NOTE) The eGFR has been calculated using the CKD EPI equation. This calculation has not been validated in all clinical situations. eGFR's persistently <60 mL/min signify possible Chronic Kidney Disease.    Anion gap 8 5 - 15  CBC     Status: None   Collection Time: 12/28/16  4:49 AM  Result Value Ref Range   WBC 6.0 4.0 - 10.5 K/uL   RBC 4.68 3.87 - 5.11 MIL/uL   Hemoglobin 12.9 12.0 - 15.0 g/dL   HCT 40.2 36.0 - 46.0 %   MCV 85.9 78.0 - 100.0 fL   MCH 27.6 26.0 - 34.0 pg   MCHC 32.1 30.0 - 36.0 g/dL   RDW 14.1 11.5 - 15.5 %   Platelets 297 150 - 400 K/uL     HEENT: Normocephalic, atraumatic Cardio: RRR and no JVD Resp: CTA B/L and unlabored GI: BS positive and ND Skin:   Intact. Warm and dry, ecchymosis from injection lower abd but no hematoma Neuro: Lethargic,  Motor: 4-/5 Left HF KE, 0/5 at ankle , tone MAS 2 at Left finger flexors 1 at elbow flexors 0/5 in LUE with spastic tone throughout Musc/Skel:  No edema, no tenderness Gen NAD. Vital signs reivewed  Assessment/Plan: 1. Functional deficits  secondary to Right MCA infarct which require 3+ hours per day of interdisciplinary therapy in a comprehensive inpatient rehab setting. Physiatrist is providing close team supervision and 24 hour management of active medical problems listed below. Physiatrist and rehab team continue to assess barriers to discharge/monitor patient progress toward functional and medical goals. FIM: Function - Bathing Position: Shower Body parts bathed by patient: Left arm, Chest, Abdomen, Front perineal area, Right upper leg, Left upper leg, Right lower leg, Left lower leg Body parts bathed by helper: Right arm, Buttocks, Back Assist Level: Touching or steadying assistance(Pt > 75%)  Function- Upper Body Dressing/Undressing What is the patient wearing?: Pull over shirt/dress, Bra Bra - Perfomed by patient: Thread/unthread left bra strap, Thread/unthread right bra strap Bra - Perfomed by helper: Hook/unhook bra (pull down sports bra) Pull over shirt/dress - Perfomed by patient: Thread/unthread right sleeve, Thread/unthread left sleeve, Put head through opening, Pull shirt over trunk Pull over shirt/dress - Perfomed by helper: Thread/unthread left sleeve, Pull shirt over trunk Assist Level: Touching or steadying assistance(Pt > 75%) Function - Lower Body Dressing/Undressing What is the patient wearing?: Underwear, Pants, Socks,  Shoes, AFO Position: Wheelchair/chair at sink Underwear - Performed by patient: Thread/unthread right underwear leg, Thread/unthread left underwear leg Underwear - Performed by helper: Pull underwear up/down Pants- Performed by patient: Thread/unthread right pants leg, Thread/unthread left pants leg Pants- Performed by helper: Pull pants up/down Non-skid slipper socks- Performed by helper: Don/doff right sock, Don/doff left sock Socks - Performed by patient: Don/doff right sock Socks - Performed by helper: Don/doff left sock Shoes - Performed by patient: Don/doff right shoe, Fasten  right Shoes - Performed by helper: Don/doff left shoe, Fasten left AFO - Performed by helper: Don/doff left AFO Assist for footwear: Partial/moderate assist Assist for lower body dressing: Touching or steadying assistance (Pt > 75%)  Function - Toileting Toileting activity did not occur: No continent bowel/bladder event Toileting steps completed by patient: Performs perineal hygiene Toileting steps completed by helper: Adjust clothing prior to toileting, Performs perineal hygiene, Adjust clothing after toileting Toileting Assistive Devices: Grab bar or rail Assist level: Touching or steadying assistance (Pt.75%)  Function - Air cabin crew transfer assistive device: Grab bar Assist level to toilet: Touching or steadying assistance (Pt > 75%) Assist level from toilet: Touching or steadying assistance (Pt > 75%)  Function - Chair/bed transfer Chair/bed transfer method: Stand pivot Chair/bed transfer assist level: Touching or steadying assistance (Pt > 75%) Chair/bed transfer assistive device: Armrests Chair/bed transfer details: Verbal cues for precautions/safety, Verbal cues for sequencing  Function - Locomotion: Wheelchair Will patient use wheelchair at discharge?:  (TBD) Type: Manual Max wheelchair distance: 149f Assist Level: Supervision or verbal cues Assist Level: Supervision or verbal cues Assist Level: Supervision or verbal cues Turns around,maneuvers to table,bed, and toilet,negotiates 3% grade,maneuvers on rugs and over doorsills: No Function - Locomotion: Ambulation Assistive device: No device Max distance: 267fAssist level: Maximal assist (Pt 25 - 49%) Assist level: Maximal assist (Pt 25 - 49%) Walk 50 feet with 2 turns activity did not occur: Safety/medical concerns Assist level: Moderate assist (Pt 50 - 74%) Walk 150 feet activity did not occur: Safety/medical concerns Assist level: Moderate assist (Pt 50 - 74%) Walk 10 feet on uneven surfaces activity  did not occur: Safety/medical concerns  Function - Comprehension Comprehension: Auditory Comprehension assist level: Understands basic 90% of the time/cues < 10% of the time, Follows basic conversation/direction with extra time/assistive device  Function - Expression Expression: Verbal Expression assist level: Expresses basic 90% of the time/requires cueing < 10% of the time.  Function - Social Interaction Social Interaction assist level: Interacts appropriately 90% of the time - Needs monitoring or encouragement for participation or interaction.  Function - Problem Solving Problem solving assist level: Solves basic 90% of the time/requires cueing < 10% of the time  Function - Memory Memory assist level: Recognizes or recalls 90% of the time/requires cueing < 10% of the time Patient normally able to recall (first 3 days only): Current season, That he or she is in a hospital, Staff names and faces, Location of own room   Medical Problem List and Plan: 1. Left hemiparesis secondary to Right MCA infarct  ASA and Plavix  Cont CIR   PT,OT, SLP-  Progressing toward goal with tent D/C 8/25     2. DVT Prophylaxis/Anticoagulation: Pharmaceutical: Lovenox -cont SCD off lovenox due to excessive bruising/pt request  3. Migraine Headaches/Pain Management: has been getting Toradol since 7/14. On low dose topamax-. Sensory dysesthesia LLE improved 4. Mood: LCSW to follow for evaluation and support.  5. Neuropsych: This patient is not fullycapable of making  decisions on herown behalf. 6. Skin/Wound Care: routine pressure relief measures  7. Fluids/Electrolytes/Nutrition: Liquids advanced to thins Monitor I/O.  Low alb will supplement with prostat  BMP normal on 8/8   Protein supplement changed due to pt preference 8. Dyslipidemia: On Lipitor. 9. Thyroid nodule: follow up on outpatient basis 10. Obesity: Educate on importance of weight loss with appropriate diet to promote health and mobility.   11. Question antiphospholipid syndrome: high Beta 2-glycoprotein IgG. Will need recheck labs in 12 weeks. 12.  Spasticity LUE and LLE- improving  resting hand splint   Cont tizanidine 64m qhs, no spasm which disturb sleep 13.  Constipation- last BM 8/8 cont miralax  15.  Urinary incont-still using diaper, toileting program, low bladder scans   LOS (Days) 20 A FACE TO FACE EVALUATION WAS PERFORMED  [employer] Null E 12/30/2016, 7:00 AM

## 2016-12-30 NOTE — Progress Notes (Signed)
Speech Language Pathology Daily Session Note  Patient Details  Name: Shannon Erickson MRN: [id number] Date of Birth: 08/14/1975  Today's Date: 12/30/2016 SLP Individual Time: 1500-1530 SLP Individual Time Calculation (min): 30 min  Short Term Goals: Week 3: SLP Short Term Goal 1 (Week 3): Pt will use external memory aids to recall new daily information with Supervision verbal cues.  SLP Short Term Goal 2 (Week 3): Pt will complete semi-complex problem solving tasks with Supervision verbal cues.  SLP Short Term Goal 3 (Week 3): Pt will scan to left of her environment with supervision verbal cues to locate objects during functional tasks.  SLP Short Term Goal 4 (Week 3): Pt will demonstrate anticipatory awareness by listing 3 activities that are safe for her to participate in at discharge with Supervision verbal cues.   Skilled Therapeutic Interventions:  Pt was seen for skilled ST targeting cognitive goals.  SLP facilitated the session with a semi-complex scheduling task to address goals for problem solving.  Pt needed min assist verbal cues for task organization and planning.  Pt was able to recognize and correct errors with min assist question cues.  SLP reviewed and reinforced organizational strategies to maximize functional independence.  Pt was left in wheelchair with daughter at bedside.  Continue per current plan of care.    Function:  Eating Eating                Cognition Comprehension Comprehension assist level: Follows basic conversation/direction with extra time/assistive device  Expression   Expression assist level: Expresses basic needs/ideas: With extra time/assistive device  Social Interaction Social Interaction assist level: Interacts appropriately with others with medication or extra time (anti-anxiety, antidepressant).  Problem Solving Problem solving assist level: Solves basic 75 - 89% of the time/requires cueing 10 - 24% of the time  Memory Memory assist level:  Recognizes or recalls 90% of the time/requires cueing < 10% of the time    Pain Pain Assessment Pain Assessment: No/denies pain Pain Score: 0-No pain  Therapy/Group: Individual Therapy  Fontella Shan, Melanee SpryNicole L 12/30/2016, 9:49 PM

## 2016-12-30 NOTE — Progress Notes (Signed)
12/30/16 1800  What Happened  Was fall witnessed? Yes  Who witnessed fall? Hope Bercaw NT  Patients activity before fall to/from bed, chair, or stretcher  Point of contact hip/leg  Was patient injured? No  Follow Up  MD notified Allena KatzPatel   Time MD notified 1750  Family notified Yes-comment (Daughter Hillery HunterCareina)  Time family notified 1740  Additional tests No  Simple treatment Dressing  Progress note created (see row info) Yes  Adult Fall Risk Assessment  Risk Factor Category (scoring not indicated) Fall has occurred during this admission (document High fall risk)  Patient's Fall Risk High Fall Risk (>13 points)  Adult Fall Risk Interventions  Required Bundle Interventions *See Row Information* High fall risk - low, moderate, and high requirements implemented  Additional Interventions Use of appropriate toileting equipment (bedpan, BSC, etc.);Family Supervision  Screening for Fall Injury Risk  Risk For Fall Injury- See Row Information  None identified  Vitals  Temp 98.1 F (36.7 C)  Temp Source Oral  BP 107/72  BP Location Right Arm  BP Method Automatic  Patient Position (if appropriate) Lying  Pulse Rate 91  Pulse Rate Source Dinamap  Resp 20  Oxygen Therapy  SpO2 99 %  O2 Device Room Air  Pain Assessment  Pain Assessment No/denies pain  Pain Score 0  Neurological  Neuro (WDL) X  Level of Consciousness Alert  Orientation Level Oriented X4  Cognition Appropriate at baseline  Speech Clear  Pupil Assessment  No  R Hand Grip Moderate  L Hand Grip Absent   R Foot Dorsiflexion Moderate  L Foot Dorsiflexion Weak  R Foot Plantar Flexion Moderate  L Foot Plantar Flexion Weak  RUE Motor Response Purposeful movement  RUE Sensation Full sensation  RUE Motor Strength 4  LUE Motor Response No movement to painful stimulus  LUE Sensation Decreased  LUE Motor Strength 0  RLE Motor Response Purposeful movement  RLE Sensation Full sensation  RLE Motor Strength 4  LLE Motor  Response Non-purposeful movement  LLE Sensation Decreased  LLE Motor Strength 2  Neuro Symptoms None  Musculoskeletal  Musculoskeletal (WDL) X  Assistive Device Wheelchair  Generalized Weakness Yes  Weight Bearing Restrictions No  Musculoskeletal Details  RUE Full movement  LUE Paralysis;Ortho/Supportive Device  RLE Full movement  LLE Limited movement;Weakness;Ortho/Supportive Device  LUE Ortho/Supportive Device Splint  LLE Ortho/Supportive Device Other (Comment) (AFO/PRAFO)  Integumentary  Integumentary (WDL) X  Skin Color Appropriate for ethnicity  Skin Condition Dry  Skin Integrity Ecchymosis;Abrasion  Abrasion Location Knee  Abrasion Location Orientation Right  Abrasion Intervention Cleansed  Ecchymosis Location Abdomen  Ecchymosis Location Orientation Right;Left  Skin Turgor Non-tenting    Pt was transferring with a stand pivot from the wheelchair to the bed with the NT present. Pt's knee buckled during the transfer and pt was lowered to the floor. NT pressed her alarm button and RN arrived directly after the fall occurred. Pt did not hit her head and was not complaining of any pain so she was transferred to bed. RN assessed pt and found a small skin tear which she treated with a tegaderm. pt had no other injuries and is alert and oriented. Pt's daughter arrived at bedside and is aware of patient fall. On call MD notified and Alphonzo LemmingsWhitney, Charge RN aware.

## 2016-12-30 NOTE — Progress Notes (Signed)
Physical Therapy Session Note  Patient Details  Name: Shannon Erickson MRN: [id number] Date of Birth: Oct 16, 1975  Today's Date: 12/30/2016 PT Individual Time: 1002-1046 PT Individual Time Calculation (min): 44 min    Skilled Therapeutic Interventions/Progress Updates:    Vitls pre:  BP: 101/59.  Pt denies pain.  Pt donned shoes and socks with min assist from therapist with L LE to stabilize L LE.  Session focused on promoting independence with w/c propulsion in preparation for d/C home.  Pt propelled > 150 ft over level tile and carpeted surfaces with supervision use R UE/LE.  Pt then transferred to mat table with min to mod assist and transitioned to supine with supervision.  Pt required significant manual cueing with sit to stand transfer to facilitate increased weight bearing and activation of L LE.  Utilized 10# weight posterior to R heel, and pt required mod assist when attempting to stand more with L LE and demos poor static balance with L LE posterior.  Following session, pt left up in room in wheelchair with daughter present and needs met.  Therapy Documentation Precautions:  Precautions Precautions: Fall Precaution Comments: L hemiplegia Restrictions Weight Bearing Restrictions: No   See Function Navigator for Current Functional Status.   Therapy/Group: Individual Therapy  Cletus Paris Hilario Quarry 12/30/2016, 12:45 PM

## 2016-12-31 ENCOUNTER — Inpatient Hospital Stay (HOSPITAL_COMMUNITY): Payer: Self-pay | Admitting: Occupational Therapy

## 2016-12-31 ENCOUNTER — Inpatient Hospital Stay (HOSPITAL_COMMUNITY): Payer: Medicaid Other | Admitting: Occupational Therapy

## 2016-12-31 DIAGNOSIS — W1800XA Striking against unspecified object with subsequent fall, initial encounter: Secondary | ICD-10-CM | POA: Insufficient documentation

## 2016-12-31 NOTE — Progress Notes (Signed)
Subjective/Complaints: Patient seen lying in bed this morning. She slept well overnight. She had a fall yesterday.  ROS: Denies CP, SOB, N/V/D   Objective: Vital Signs: Blood pressure 107/69, pulse 68, temperature 98.4 F (36.9 C), temperature source Oral, resp. rate 17, height 5\' 3"  (1.6 m), weight 86.8 kg (191 lb 5.4 oz), SpO2 98 %. No results found. No results found for this or any previous visit (from the past 72 hour(s)).   Gen NAD. Vital signs reivewed HEENT: Normocephalic, atraumatic Cardio: RRR and no JVD Resp: CTA B/L and Unlabored GI: BS positive and ND Musc/Skel:  No edema, no tenderness Neuro: Alert Motor: 4-/5 Left HF KE, 0/5 at ankle 0/5 in LUE with spastic tone throughout Skin:   Intact. Warm and dry. Right knee without erythema, warmth  Assessment/Plan: 1. Functional deficits secondary to Right MCA infarct which require 3+ hours per day of interdisciplinary therapy in a comprehensive inpatient rehab setting. Physiatrist is providing close team supervision and 24 hour management of active medical problems listed below. Physiatrist and rehab team continue to assess barriers to discharge/monitor patient progress toward functional and medical goals. FIM: Function - Bathing Position: Shower Body parts bathed by patient: Left arm, Chest, Abdomen, Front perineal area, Right upper leg, Left upper leg, Right lower leg, Left lower leg Body parts bathed by helper: Right arm, Buttocks, Back Assist Level: Touching or steadying assistance(Pt > 75%)  Function- Upper Body Dressing/Undressing What is the patient wearing?: Pull over shirt/dress, Bra Bra - Perfomed by patient: Thread/unthread left bra strap, Thread/unthread right bra strap Bra - Perfomed by helper: Hook/unhook bra (pull down sports bra) Pull over shirt/dress - Perfomed by patient: Thread/unthread right sleeve, Thread/unthread left sleeve, Put head through opening, Pull shirt over trunk Pull over shirt/dress -  Perfomed by helper: Thread/unthread left sleeve, Pull shirt over trunk Assist Level: Touching or steadying assistance(Pt > 75%) Function - Lower Body Dressing/Undressing What is the patient wearing?: Underwear, Pants, Socks, Shoes, AFO Position: Wheelchair/chair at sink Underwear - Performed by patient: Thread/unthread right underwear leg, Thread/unthread left underwear leg Underwear - Performed by helper: Pull underwear up/down Pants- Performed by patient: Thread/unthread right pants leg, Thread/unthread left pants leg Pants- Performed by helper: Pull pants up/down Non-skid slipper socks- Performed by helper: Don/doff right sock, Don/doff left sock Socks - Performed by patient: Don/doff right sock Socks - Performed by helper: Don/doff left sock Shoes - Performed by patient: Don/doff right shoe, Fasten right Shoes - Performed by helper: Don/doff left shoe, Fasten left AFO - Performed by helper: Don/doff left AFO Assist for footwear: Partial/moderate assist Assist for lower body dressing: Touching or steadying assistance (Pt > 75%)  Function - Toileting Toileting activity did not occur: No continent bowel/bladder event Toileting steps completed by patient: Performs perineal hygiene Toileting steps completed by helper: Adjust clothing prior to toileting, Performs perineal hygiene, Adjust clothing after toileting Toileting Assistive Devices: Grab bar or rail Assist level: Touching or steadying assistance (Pt.75%)  Function - Archivist transfer assistive device: Grab bar Assist level to toilet: Touching or steadying assistance (Pt > 75%) Assist level from toilet: Touching or steadying assistance (Pt > 75%)  Function - Chair/bed transfer Chair/bed transfer method: Stand pivot Chair/bed transfer assist level: Touching or steadying assistance (Pt > 75%) Chair/bed transfer assistive device: Armrests Chair/bed transfer details: Verbal cues for safe use of DME/AE, Verbal cues  for sequencing, Verbal cues for technique, Verbal cues for precautions/safety  Function - Locomotion: Wheelchair Will patient use wheelchair at  discharge?:  (TBD) Type: Manual Max wheelchair distance:  (150 ft) Assist Level: Supervision or verbal cues Assist Level: Supervision or verbal cues Assist Level: Supervision or verbal cues Turns around,maneuvers to table,bed, and toilet,negotiates 3% grade,maneuvers on rugs and over doorsills: No Function - Locomotion: Ambulation Assistive device: Walker-hemi Max distance: 4790ft Assist level: Moderate assist (Pt 50 - 74%) Assist level: Moderate assist (Pt 50 - 74%) Walk 50 feet with 2 turns activity did not occur: Safety/medical concerns Assist level: Moderate assist (Pt 50 - 74%) Walk 150 feet activity did not occur: Safety/medical concerns Assist level: Moderate assist (Pt 50 - 74%) Walk 10 feet on uneven surfaces activity did not occur: Safety/medical concerns  Function - Comprehension Comprehension: Auditory Comprehension assist level: Follows basic conversation/direction with extra time/assistive device  Function - Expression Expression: Verbal Expression assist level: Expresses basic needs/ideas: With extra time/assistive device  Function - Social Interaction Social Interaction assist level: Interacts appropriately with others with medication or extra time (anti-anxiety, antidepressant).  Function - Problem Solving Problem solving assist level: Solves basic 75 - 89% of the time/requires cueing 10 - 24% of the time  Function - Memory Memory assist level: Recognizes or recalls 90% of the time/requires cueing < 10% of the time Patient normally able to recall (first 3 days only): Current season, That he or she is in a hospital, Staff names and faces, Location of own room   Medical Problem List and Plan: 1. Left hemiparesis secondary to Right MCA infarct  ASA and Plavix  Cont CIR  2. DVT Prophylaxis/Anticoagulation:  Pharmaceutical: Lovenox -cont SCD off lovenox due to excessive bruising/pt request 3. Migraine Headaches/Pain Management: has been getting Toradol since 7/14. On topamax 50 daily.   Sensory dysesthesia LLE improved  4. Mood: LCSW to follow for evaluation and support.  5. Neuropsych: This patient is not fullycapable of making decisions on herown behalf. 6. Skin/Wound Care: routine pressure relief measures  7. Fluids/Electrolytes/Nutrition: Liquids advanced to thins Monitor I/O.  Low alb will supplement with prostat  BMP normal on 8/8   Protein supplement changed due to pt preference 8. Dyslipidemia: On Lipitor. 9. Thyroid nodule: follow up on outpatient basis 10. Obesity: Educate on importance of weight loss with appropriate diet to promote health and mobility.  11. Question antiphospholipid syndrome: high Beta 2-glycoprotein IgG. Will need recheck labs in 12 weeks. 12.  Spasticity LUE and LLE- improving   resting hand splint   Cont tizanidine 4mg  qhs, no spasm which disturb sleep 13.  Constipation- last BM 8/10  Cont miralax 15.  Urinary incont-still using diaper, toileting program, low bladder scans    16. Fall on 8/10  Assisted fall  Frequent checks  Cont to monitor  LOS (Days) 21 A FACE TO FACE EVALUATION WAS PERFORMED  Shannon Erickson Karis JubaAnil Harlan Ervine 12/31/2016, 7:07 AM

## 2016-12-31 NOTE — Progress Notes (Signed)
Occupational Therapy Session Note  Patient Details  Name: Shannon DiceCynthia Swanger MRN: [id number] Date of Birth: 11/29/1975  Today's Date: 12/31/2016 OT Individual Time: 1105-1200 OT Individual Time Calculation (min): 55 min    Short Term Goals: Week 3:  OT Short Term Goal 1 (Week 3): Pt will demonstrate improved LUE body awareness by using R hand to position L arm prior to transfers. OT Short Term Goal 2 (Week 3): Pt will complete shower transfers with consistent supervision and out of shower OT Short Term Goal 3 (Week 3): Pt will don AFO with min A OT Short Term Goal 4 (Week 3): Pt will thread both pant legs with min A  Skilled Therapeutic Interventions/Progress Updates:    Treatment session with focus on transfers and LUE NMR.  Pt received supine in bed reporting desire to work on arm.  Completed bed mobility with min assist and min assist squat pivot transfer to w/c with min cues to attend to LUE.  Engaged in LUE NMR in supine with focus on PROM and AAROM.  PROM addressing shoulder flexion/extension, internal/external rotation, and abduction/adduction as well as elbow flexion/extension.  Pt able to elicit minimal shoulder adduction after stretching and PROM as well as noting trace elbow flexion with tactile cues and tapping.  Engaged in WB in sitting with pt reaching with RUE to obtain items across midline to promote increased weight bearing through LUE.  Following WB, engaged in shoulder flexion/extension and internal/external rotation with use of UE Ranger.  Therapist providing tactile cues at elbow and shoulder to reduce compensatory movements.  Therapy Documentation Precautions:  Precautions Precautions: Fall Precaution Comments: L hemiplegia Restrictions Weight Bearing Restrictions: No General:   Vital Signs: Therapy Vitals Temp: 98 F (36.7 C) Temp Source: Oral Pulse Rate: 91 Resp: 17 BP: 104/67 Patient Position (if appropriate): Sitting Oxygen Therapy SpO2: 98 % O2 Device:  Not Delivered Pain:  Pt with no c/o pain  See Function Navigator for Current Functional Status.   Therapy/Group: Individual Therapy  Rosalio LoudHOXIE, Soleia Badolato 12/31/2016, 2:49 PM

## 2016-12-31 NOTE — Progress Notes (Signed)
Occupational Therapy Session Note  Patient Details  Name: Shannon Erickson MRN: [id number] Date of Birth: 02/05/1976  Today's Date: 12/31/2016 OT Individual Time: [id number] OT Individual Time Calculation (min): 36 min    Skilled Therapeutic Interventions/Progress Updates:    Tx focus on L NMR, balance, and psychosocial wellness.   Pt greeted supine in bed, agreeable to tx. Pt completed supine<sit with min guard and instruction on sequencing. Once EOB, pt engaging in towel slides, moving L UE in all planes of movement, while in beat to meaningful music. Had her practice gentle grasping, followed by strong release with red foam sponge. Emphasis on finger extension during release to improve hand control. Educated her on relaxation techniques to improve tone. Also discussed mental practice of meaningful activities (that incorporated L UE) to promote neuroplastic changes/functional return. Pt becoming teary, reporting she did not feel like her left arm was making enough progress, also upset about her fall yesterday. Pt provided with a great deal of therapeutic listening, support, and encouragement. Daughter was present, and also providing encouragement. Pts affect appeared to brighten. Afterwards pt transitioned back to supine. She boosted herself up in bed with use of bedrails. Pt left with all needs within reach and bed alarm activated. 4 bed rails up per pt request.   Therapy Documentation Precautions:  Precautions Precautions: Fall Precaution Comments: L hemiplegia Restrictions Weight Bearing Restrictions: No   Pain: On tail bone. Pt reporting pain to be manageable during tx    ADL:      See Function Navigator for Current Functional Status.   Therapy/Group: Individual Therapy  Shannon Erickson 12/31/2016, 12:27 PM

## 2017-01-01 ENCOUNTER — Inpatient Hospital Stay (HOSPITAL_COMMUNITY): Payer: Self-pay

## 2017-01-01 DIAGNOSIS — R32 Unspecified urinary incontinence: Secondary | ICD-10-CM

## 2017-01-01 DIAGNOSIS — N39498 Other specified urinary incontinence: Secondary | ICD-10-CM

## 2017-01-01 NOTE — Progress Notes (Signed)
Dr. Allena KatzPatel made aware of patient's pain on her buttock area ( tail bone ). Patient repositioned as she refused pain medicine last night.

## 2017-01-01 NOTE — Progress Notes (Signed)
Subjective/Complaints:  Patient seen lying in bed this morning. She states she slept well overnight. Per nursing, patient complained of sacral soreness today, which appears to have relieved with Tylenol.  ROS: Denies CP, SOB, N/V/D   Objective: Vital Signs: Blood pressure 108/65, pulse 82, temperature 98.4 F (36.9 C), temperature source Oral, resp. rate 18, height 5\' 3"  (1.6 m), weight 86.8 kg (191 lb 5.4 oz), SpO2 98 %. No results found. No results found for this or any previous visit (from the past 72 hour(s)).   Gen NAD. Vital signs reivewed HEENT: Normocephalic, atraumatic Cardio: RRR and no JVD Resp: CTA B/L and Unlabored GI: BS positive and ND Musc/Skel:  No edema, no tenderness Neuro: Alert Motor: 4-/5 Left HF KE, 0/5 at ankle 0/5 in LUE with spastic tone throughout Skin:   Intact. Warm and dry. Right knee without erythema, warmth  Assessment/Plan: 1. Functional deficits secondary to Right MCA infarct which require 3+ hours per day of interdisciplinary therapy in a comprehensive inpatient rehab setting. Physiatrist is providing close team supervision and 24 hour management of active medical problems listed below. Physiatrist and rehab team continue to assess barriers to discharge/monitor patient progress toward functional and medical goals. FIM: Function - Bathing Position: Shower Body parts bathed by patient: Left arm, Chest, Abdomen, Front perineal area, Right upper leg, Left upper leg, Right lower leg, Left lower leg Body parts bathed by helper: Right arm, Buttocks, Back Assist Level: Touching or steadying assistance(Pt > 75%)  Function- Upper Body Dressing/Undressing What is the patient wearing?: Pull over shirt/dress, Bra Bra - Perfomed by patient: Thread/unthread left bra strap, Thread/unthread right bra strap Bra - Perfomed by helper: Hook/unhook bra (pull down sports bra) Pull over shirt/dress - Perfomed by patient: Thread/unthread right sleeve,  Thread/unthread left sleeve, Put head through opening, Pull shirt over trunk Pull over shirt/dress - Perfomed by helper: Thread/unthread left sleeve, Pull shirt over trunk Assist Level: Touching or steadying assistance(Pt > 75%) Function - Lower Body Dressing/Undressing What is the patient wearing?: Underwear, Pants, Socks, Shoes, AFO Position: Wheelchair/chair at sink Underwear - Performed by patient: Thread/unthread right underwear leg, Thread/unthread left underwear leg Underwear - Performed by helper: Pull underwear up/down Pants- Performed by patient: Thread/unthread right pants leg, Thread/unthread left pants leg Pants- Performed by helper: Pull pants up/down Non-skid slipper socks- Performed by helper: Don/doff right sock, Don/doff left sock Socks - Performed by patient: Don/doff right sock Socks - Performed by helper: Don/doff left sock Shoes - Performed by patient: Don/doff right shoe, Fasten right Shoes - Performed by helper: Don/doff left shoe, Fasten left AFO - Performed by helper: Don/doff left AFO Assist for footwear: Partial/moderate assist Assist for lower body dressing: Touching or steadying assistance (Pt > 75%)  Function - Toileting Toileting activity did not occur: No continent bowel/bladder event Toileting steps completed by patient: Performs perineal hygiene Toileting steps completed by helper: Adjust clothing prior to toileting, Performs perineal hygiene, Adjust clothing after toileting Toileting Assistive Devices: Grab bar or rail Assist level: Touching or steadying assistance (Pt.75%)  Function - ArchivistToilet Transfers Toilet transfer assistive device: Bedside commode Assist level to toilet: Touching or steadying assistance (Pt > 75%) Assist level from toilet: Touching or steadying assistance (Pt > 75%) Assist level to bedside commode (at bedside): Touching or steadying assistance (Pt > 75%)  Function - Chair/bed transfer Chair/bed transfer method: Stand  pivot Chair/bed transfer assist level: Touching or steadying assistance (Pt > 75%) Chair/bed transfer assistive device: Armrests Chair/bed transfer details: Verbal cues  for safe use of DME/AE, Verbal cues for sequencing, Verbal cues for technique, Verbal cues for precautions/safety  Function - Locomotion: Wheelchair Will patient use wheelchair at discharge?:  (TBD) Type: Manual Max wheelchair distance:  (150 ft) Assist Level: Supervision or verbal cues Assist Level: Supervision or verbal cues Assist Level: Supervision or verbal cues Turns around,maneuvers to table,bed, and toilet,negotiates 3% grade,maneuvers on rugs and over doorsills: No Function - Locomotion: Ambulation Assistive device: Walker-hemi Max distance: 37ft Assist level: Moderate assist (Pt 50 - 74%) Assist level: Moderate assist (Pt 50 - 74%) Walk 50 feet with 2 turns activity did not occur: Safety/medical concerns Assist level: Moderate assist (Pt 50 - 74%) Walk 150 feet activity did not occur: Safety/medical concerns Assist level: Moderate assist (Pt 50 - 74%) Walk 10 feet on uneven surfaces activity did not occur: Safety/medical concerns  Function - Comprehension Comprehension: Auditory Comprehension assist level: Follows basic conversation/direction with extra time/assistive device  Function - Expression Expression: Verbal Expression assist level: Expresses basic needs/ideas: With extra time/assistive device  Function - Social Interaction Social Interaction assist level: Interacts appropriately with others with medication or extra time (anti-anxiety, antidepressant).  Function - Problem Solving Problem solving assist level: Solves basic 75 - 89% of the time/requires cueing 10 - 24% of the time  Function - Memory Memory assist level: Recognizes or recalls 90% of the time/requires cueing < 10% of the time Patient normally able to recall (first 3 days only): Current season, That he or she is in a hospital,  Staff names and faces, Location of own room   Medical Problem List and Plan: 1. Left hemiparesis secondary to Right MCA infarct  ASA and Plavix  Cont CIR  2. DVT Prophylaxis/Anticoagulation: Pharmaceutical: Lovenox -cont SCD off lovenox due to excessive bruising/pt request 3. Migraine Headaches/Pain Management: has been getting Toradol since 7/14. On topamax 50 daily.   Sensory dysesthesia LLE improved  4. Mood: LCSW to follow for evaluation and support.  5. Neuropsych: This patient is not fullycapable of making decisions on herown behalf. 6. Skin/Wound Care: routine pressure relief measures  7. Fluids/Electrolytes/Nutrition: Liquids advanced to thins Monitor I/O.  Low alb will supplement with prostat  BMP normal on 8/8   Protein supplement changed due to pt preference 8. Dyslipidemia: On Lipitor. 9. Thyroid nodule: follow up on outpatient basis 10. Obesity: Educate on importance of weight loss with appropriate diet to promote health and mobility.  11. Question antiphospholipid syndrome: high Beta 2-glycoprotein IgG. Will need recheck labs in 12 weeks. 12.  Spasticity LUE and LLE- improving   resting hand splint   Cont tizanidine 4mg  qhs, no spasm which disturb sleep 13.  Constipation: Improving   Cont miralax 15.  Urinary incont: Improving  Cont toileting program 16. Fall on 8/10  Assisted fall  Frequent checks  Cont to monitor  LOS (Days) 22 A FACE TO FACE EVALUATION WAS PERFORMED  Shannon Erickson 01/01/2017, 6:59 AM

## 2017-01-01 NOTE — Progress Notes (Signed)
Occupational Therapy Session Note  Patient Details  Name: Kharis Lapenna MRN: [id number] Date of Birth: 1975-06-22  Today's Date: 01/01/2017 OT Individual Time: 1300-1353 OT Individual Time Calculation (min): 53 min    Skilled Therapeutic Interventions/Progress Updates:    1:1. Pt requesting to eat lunch. Pt supine>sitting EOB with MIN A for trunk facilitation from flat bed. Pt dons pants EOB with MIN A for advancing pants past hips. Pt stands with MOD A for lifting to clothing management. Pt eats EOB without UE/LE support to improve postural control required for ADLs. Pt squat pivot transfer EOB>w/c<>EOM with min A and VC for safety awareness. OT demonstrates PROM exercises to daughter present during session utilizing handout with pictures. Daughter able to return demonstrate with min instructional cues for UE PROM. Exited session with pt seated in w/c call light in reach and all needs met.   Therapy Documentation Precautions:  Precautions Precautions: Fall Precaution Comments: L hemiplegia Restrictions Weight Bearing Restrictions: No  See Function Navigator for Current Functional Status.   Therapy/Group: Individual Therapy  Tonny Branch 01/01/2017, 2:01 PM

## 2017-01-02 ENCOUNTER — Inpatient Hospital Stay (HOSPITAL_COMMUNITY): Payer: Medicaid Other | Admitting: Physical Therapy

## 2017-01-02 ENCOUNTER — Inpatient Hospital Stay (HOSPITAL_COMMUNITY): Payer: Self-pay | Admitting: Occupational Therapy

## 2017-01-02 ENCOUNTER — Inpatient Hospital Stay (HOSPITAL_COMMUNITY): Payer: Medicaid Other | Admitting: Speech Pathology

## 2017-01-02 ENCOUNTER — Inpatient Hospital Stay (HOSPITAL_COMMUNITY): Payer: Self-pay

## 2017-01-02 NOTE — Progress Notes (Signed)
Speech Language Pathology Weekly Progress and Session Note  Patient Details  Name: Shannon Erickson MRN: [id number] Date of Birth: 12-16-1975  Beginning of progress report period: December 26, 2016 End of progress report period: January 07, 2017  Today's Date: 01/02/2017 SLP Individual Time: 0900-0950 SLP Individual Time Calculation (min): 50 min  Short Term Goals: Week 3: SLP Short Term Goal 1 (Week 3): Pt will use external memory aids to recall new daily information with Supervision verbal cues.  SLP Short Term Goal 1 - Progress (Week 3): Met SLP Short Term Goal 2 (Week 3): Pt will complete semi-complex problem solving tasks with Supervision verbal cues.  SLP Short Term Goal 2 - Progress (Week 3): Not met SLP Short Term Goal 3 (Week 3): Pt will scan to left of her environment with supervision verbal cues to locate objects during functional tasks.  SLP Short Term Goal 3 - Progress (Week 3): Met SLP Short Term Goal 4 (Week 3): Pt will demonstrate anticipatory awareness by listing 3 activities that are safe for her to participate in at discharge with Supervision verbal cues.  SLP Short Term Goal 4 - Progress (Week 3): Met    New Short Term Goals: Week 4: SLP Short Term Goal 1 (Week 4): Pt will use external memory aids to recall new daily information with Mod I.  SLP Short Term Goal 2 (Week 4): Pt will complete semi-complex problem solving tasks with Supervision verbal cues.  SLP Short Term Goal 3 (Week 4): Pt will scan to left of her environment with Mod I to locate objects during functional tasks.  SLP Short Term Goal 4 (Week 4): Pt will demonstrate anticipatory awareness by listing 3 activities that are safe for her to participate in at discharge with Mod I.   Weekly Progress Updates: Patient has made excellent gains and has met 3 of 4 STG's this reporting period. Currently, patient requires overall Min A to complete functional and mildly complex tasks safely in regards to problem solving  and supervision verbal cues for scanning to left field of environment, anticipatory awareness and recall.   Patient and family education is ongoing. Patient would benefit from continued skilled SLP intervention to maximize her cognitive function and overall functional independence prior to discharge.      Intensity: Minumum of 1-2 x/day, 30 to 90 minutes Frequency: 3 to 5 out of 7 days Duration/Length of Stay: 01/14/17 Treatment/Interventions: Cognitive remediation/compensation;Cueing hierarchy;Functional tasks;Patient/family education;Therapeutic Activities;Internal/external aids   Daily Session  Skilled Therapeutic Interventions: Skilled treatment session for focused on cognitive goals. SLP facilitated session by providing supervision verbal cues for patient to self-monitor and correct errors while following mildly complex, multi-step directions during a written task. Patient left upright in wheelchair with daughter present. Continue with current plan of care.       Function:   Cognition Comprehension Comprehension assist level: Follows basic conversation/direction with extra time/assistive device  Expression   Expression assist level: Expresses basic needs/ideas: With extra time/assistive device  Social Interaction Social Interaction assist level: Interacts appropriately with others with medication or extra time (anti-anxiety, antidepressant).  Problem Solving Problem solving assist level: Solves basic 75 - 89% of the time/requires cueing 10 - 24% of the time  Memory Memory assist level: Recognizes or recalls 90% of the time/requires cueing < 10% of the time   Pain Pain Assessment Pain Assessment: No/denies pain  Therapy/Group: Individual Therapy  Shannon Erickson 01/02/2017, 11:03 AM

## 2017-01-02 NOTE — Progress Notes (Signed)
Physical Therapy Session Note  Patient Details  Name: Shannon DiceCynthia Holaway MRN: [id number] Date of Birth: 08/05/1975  Today's Date: 01/02/2017 PT Individual Time: 8295-62131303-1417 PT Individual Time Calculation (min): 74 min   Short Term Goals: Week 3:  PT Short Term Goal 1 (Week 3): Pt will ambulate 15750ft with mod A with LRAD PT Short Term Goal 2 (Week 3): Pt will perform bed mobility without bed rails and without HOB elevated with min A PT Short Term Goal 3 (Week 3): Pt will ascend/descend 12 stairs with mod A  Skilled Therapeutic Interventions/Progress Updates:   Pt c/o pain at her "tailbone" from her recent fall on 8/10. PT treatment session focused on L LE NMR during weight bearing activities, midline orientation in standing, and L UE NMR.   Pt supine in bed upon arrival with daughter present, agreeable to PT. Pt's daughter present for entire PT session. Supine to sit using bed rails with supervision. Squat pivot bed to w/c <> mat with close supervision for safety throughout session. Stand pivot w/c <> toilet with min assist for balance. Pt's daughter assisted with clothing management for change in LB dressing. Pt's daughter rolled pt in w/c to/from gym for energy conservation. Sit to stands with close supervision throughout session with cues for increased L weight bearing. Step up with L LE on 3in step 2x to fatigue, sitting break between, with mod A for balance and cues to use L LE knee extension to step up with R LE with pt noted to be using R LE to "hop" up. Changed to a  2inch stair 2x to fatigue, sitting break between, with mod A for balance and noticed increased use of L LE knee extension to bring R LE onto step. Performed STSs on kinetron with L foot pedal to ground for forced increased use of and weight bearing through L LE. In standing, worked on maintaining L foot pedal to ground with mirror feedback for upright posture with multimodal cuing for L LE knee extension, forward hip alignment,  upright posture and L weight shift. Sitting on mat performed mirror therapy working on B UE pronation and supination with PT assisting L UE with potential trace movement, otherwise no active movement. Pt returned to room in w/c with daughter present.   Therapy Documentation Precautions:  Precautions Precautions: Fall Precaution Comments: L hemiplegia Restrictions Weight Bearing Restrictions: No   See Function Navigator for Current Functional Status.   Therapy/Group: Individual Therapy  Mauria Asquith 01/02/2017, 3:37 PM

## 2017-01-02 NOTE — Progress Notes (Signed)
Occupational Therapy Session Note  Patient Details  Name: Shannon Erickson MRN: [id number] Date of Birth: 11/26/1975  Today's Date: 01/02/2017 OT Individual Time: [id number] OT Individual Time Calculation (min): 56 min   Skilled Therapeutic Interventions/Progress Updates:    Tx focus on L NMR, balance, cognitive remediation, visual scanning, and midline orientation during meaningful tasks.   [facility] daughter providing Min A- trained caregiver per safety plan). Daughter providing appropriate safety cuing during transfers/hygiene/clothing mgt. Afterwards pt was agreeable to tx, requesting to address L UE deficits. Declining B/D due to bathing last night with NT. Facilitated HOH assist for integrating L UE as stabilizer during oral care completion, and forearm weightbearing on countertop as needed. Pt was then escorted to dayroom. Engaged in [place] game (per request) while standing, all items placed on Lt side to promote increased weightbearing to Lt. Pt provided max multimodal cuing to weightshift to Lt side and weightbear through Lt heel. L UE weightbearing through elbow throughout activity. Pt requiring mod cues for accurate counting. She was able to stand for 5 minute intervals before fatiguing. During rest breaks, pt participated in L UE AAROM/gentle stretches, and providing sensory input for NMR. At end of tx pt was escorted back to room. She was left with daughter who was setting up pts breakfast (trained helper for self feeding per safety plan).    Therapy Documentation Precautions:  Precautions Precautions: Fall Precaution Comments: L hemiplegia Restrictions Weight Bearing Restrictions: No Pain: No c/o pain during tx  Pain Assessment Pain Assessment: No/denies pain ADL:  :    See Function Navigator for Current Functional Status.   Therapy/Group: Individual Therapy  Tacoya Altizer A Shahla Betsill 01/02/2017, 12:11 PM

## 2017-01-02 NOTE — Progress Notes (Signed)
Subjective/Complaints:  Pt seen laying in bed this AM.  She slept well overnight.    ROS: Denies CP, SOB, N/V/D   Objective: Vital Signs: Blood pressure 102/61, pulse 74, temperature 97.9 F (36.6 C), temperature source Oral, resp. rate 18, height 5\' 3"  (1.6 m), weight 86.8 kg (191 lb 5.4 oz), SpO2 100 %. No results found. No results found for this or any previous visit (from the past 72 hour(s)).   Gen NAD. Vital signs reivewed HEENT: Normocephalic, atraumatic Cardio: RRR and no JVD Resp: CTA B/L and Unlabored GI: BS positive and ND Musc/Skel:  No edema, no tenderness Neuro: Alert Motor: 4-/5 Left HF KE, 0/5 at ankle 0/5 in LUE with spastic tone throughout Skin:   Intact. Warm and dry. Right knee without erythema, warmth  Assessment/Plan: 1. Functional deficits secondary to Right MCA infarct which require 3+ hours per day of interdisciplinary therapy in a comprehensive inpatient rehab setting. Physiatrist is providing close team supervision and 24 hour management of active medical problems listed below. Physiatrist and rehab team continue to assess barriers to discharge/monitor patient progress toward functional and medical goals. FIM: Function - Bathing Position: Shower Body parts bathed by patient: Left arm, Chest, Abdomen, Front perineal area, Right upper leg, Left upper leg, Right lower leg, Left lower leg Body parts bathed by helper: Right arm, Buttocks, Back Assist Level: Touching or steadying assistance(Pt > 75%)  Function- Upper Body Dressing/Undressing What is the patient wearing?: Pull over shirt/dress, Bra Bra - Perfomed by patient: Thread/unthread left bra strap, Thread/unthread right bra strap Bra - Perfomed by helper: Hook/unhook bra (pull down sports bra) Pull over shirt/dress - Perfomed by patient: Thread/unthread right sleeve, Thread/unthread left sleeve, Put head through opening, Pull shirt over trunk Pull over shirt/dress - Perfomed by helper:  Thread/unthread left sleeve, Pull shirt over trunk Assist Level: Touching or steadying assistance(Pt > 75%) Function - Lower Body Dressing/Undressing What is the patient wearing?: Underwear, Pants, Socks, Shoes, AFO Position: Wheelchair/chair at sink Underwear - Performed by patient: Thread/unthread right underwear leg, Thread/unthread left underwear leg Underwear - Performed by helper: Pull underwear up/down Pants- Performed by patient: Thread/unthread right pants leg, Thread/unthread left pants leg Pants- Performed by helper: Pull pants up/down Non-skid slipper socks- Performed by helper: Don/doff right sock, Don/doff left sock Socks - Performed by patient: Don/doff right sock Socks - Performed by helper: Don/doff left sock Shoes - Performed by patient: Don/doff right shoe, Fasten right Shoes - Performed by helper: Don/doff left shoe, Fasten left AFO - Performed by helper: Don/doff left AFO Assist for footwear: Partial/moderate assist Assist for lower body dressing: Touching or steadying assistance (Pt > 75%)  Function - Toileting Toileting activity did not occur: No continent bowel/bladder event Toileting steps completed by patient: Performs perineal hygiene Toileting steps completed by helper: Adjust clothing prior to toileting, Performs perineal hygiene, Adjust clothing after toileting Toileting Assistive Devices: Grab bar or rail Assist level: Touching or steadying assistance (Pt.75%)  Function - Archivist transfer assistive device: Bedside commode Assist level to toilet: Touching or steadying assistance (Pt > 75%) Assist level from toilet: Touching or steadying assistance (Pt > 75%) Assist level to bedside commode (at bedside): Moderate assist (Pt 50 - 74%/lift or lower) Assist level from bedside commode (at bedside): Moderate assist (Pt 50 - 74%/lift or lower)  Function - Chair/bed transfer Chair/bed transfer method: Stand pivot Chair/bed transfer assist level:  Touching or steadying assistance (Pt > 75%) Chair/bed transfer assistive device: Armrests Chair/bed transfer details:  Verbal cues for safe use of DME/AE, Verbal cues for sequencing, Verbal cues for technique, Verbal cues for precautions/safety  Function - Locomotion: Wheelchair Will patient use wheelchair at discharge?:  (TBD) Type: Manual Max wheelchair distance:  (150 ft) Assist Level: Supervision or verbal cues Assist Level: Supervision or verbal cues Assist Level: Supervision or verbal cues Turns around,maneuvers to table,bed, and toilet,negotiates 3% grade,maneuvers on rugs and over doorsills: No Function - Locomotion: Ambulation Assistive device: Walker-hemi Max distance: 2790ft Assist level: Moderate assist (Pt 50 - 74%) Assist level: Moderate assist (Pt 50 - 74%) Walk 50 feet with 2 turns activity did not occur: Safety/medical concerns Assist level: Moderate assist (Pt 50 - 74%) Walk 150 feet activity did not occur: Safety/medical concerns Assist level: Moderate assist (Pt 50 - 74%) Walk 10 feet on uneven surfaces activity did not occur: Safety/medical concerns  Function - Comprehension Comprehension: Auditory Comprehension assist level: Follows basic conversation/direction with extra time/assistive device  Function - Expression Expression: Verbal Expression assist level: Expresses basic needs/ideas: With extra time/assistive device  Function - Social Interaction Social Interaction assist level: Interacts appropriately with others with medication or extra time (anti-anxiety, antidepressant).  Function - Problem Solving Problem solving assist level: Solves basic 75 - 89% of the time/requires cueing 10 - 24% of the time  Function - Memory Memory assist level: Recognizes or recalls 90% of the time/requires cueing < 10% of the time Patient normally able to recall (first 3 days only): Current season, That he or she is in a hospital, Staff names and faces, Location of own  room   Medical Problem List and Plan: 1. Left hemiparesis secondary to Right MCA infarct  ASA and Plavix  Cont CIR  2. DVT Prophylaxis/Anticoagulation: Pharmaceutical: Lovenox -cont SCD off lovenox due to excessive bruising/pt request 3. Migraine Headaches/Pain Management: has been getting Toradol since 7/14. On topamax 50 daily.   Sensory dysesthesia LLE improved  4. Mood: LCSW to follow for evaluation and support.  5. Neuropsych: This patient is not fullycapable of making decisions on herown behalf. 6. Skin/Wound Care: routine pressure relief measures  7. Fluids/Electrolytes/Nutrition: Liquids advanced to thins Monitor I/O.  Low alb will supplement with prostat  BMP within acceptable range on 8/8   Protein supplement changed due to pt preference 8. Dyslipidemia: On Lipitor. 9. Thyroid nodule: follow up on outpatient basis 10. Obesity: Educate on importance of weight loss with appropriate diet to promote health and mobility.  11. Question antiphospholipid syndrome: high Beta 2-glycoprotein IgG. Will need recheck labs in 12 weeks. 12.  Spasticity LUE and LLE   resting hand splint   Cont tizanidine 4mg  qhs  Will consider further meds if necessary 13.  Constipation: Improving   Cont miralax 15.  Urinary incont: Improving  Cont toileting program 16. Fall on 8/10  Assisted fall  Frequent checks  Cont to monitor  LOS (Days) 23 A FACE TO FACE EVALUATION WAS PERFORMED  Shannon Erickson Karis JubaAnil Nikole Swartzentruber 01/02/2017, 8:04 AM

## 2017-01-02 NOTE — Plan of Care (Signed)
Problem: RH SAFETY Goal: RH STG DECREASED RISK OF FALL WITH ASSISTANCE STG Decreased Risk of Fall With min Assistance.   Outcome: Not Progressing Pt had fall on 12/30/16. Goal updated to Moderate I Assistance.

## 2017-01-02 NOTE — Progress Notes (Signed)
Physical Therapy Session Note  Patient Details  Name: Shannon DiceCynthia Bentson MRN: [id number] Date of Birth: 07/21/1975  Today's Date: 01/02/2017 PT Individual Time: [id number] PT Individual Time Calculation (min): 30 min   Short Term Goals: Week 3:  PT Short Term Goal 1 (Week 3): Pt will ambulate 13650ft with mod A with LRAD PT Short Term Goal 2 (Week 3): Pt will perform bed mobility without bed rails and without HOB elevated with min A PT Short Term Goal 3 (Week 3): Pt will ascend/descend 12 stairs with mod A  Skilled Therapeutic Interventions/Progress Updates:    Pt seated in w/c upon PT arrival, agreeable to therapy tx and denies pain. Pt transported to gym to save time, total A. Pt performed squat pivot transfer to the mat with supervision. Session focused on L UE weightbearing and LE NMR. Pt in standing worked on UE weightbearing through table for UE support, assist needed to open and place L hand in WB position. Pt transferred sit to stand with min assist and verbal cues to correct standing posture, lateral weight shift and for L knee extension. Seated on the mat pt worked on L lateral lean over L UE for weightbearing and pushing back upright, stretching wrist and finger flexors while also promoting weightbearing. Pt performed 2 x 5 mini squats in standing for L NMR and working on L knee control, pt requiring verbal cues for technique and facilitation for trunk flexion during squatting down. Pt transferred back to w/c with supervision, squat pivot. Pt left seated in w/c in room with daughter present.   Therapy Documentation Precautions:  Precautions Precautions: Fall Precaution Comments: L hemiplegia Restrictions Weight Bearing Restrictions: No   See Function Navigator for Current Functional Status.   Therapy/Group: Individual Therapy  Cresenciano GenreEmily van Schagen, PT, DPT 01/02/2017, 5:06 PM

## 2017-01-03 ENCOUNTER — Inpatient Hospital Stay (HOSPITAL_COMMUNITY): Payer: Self-pay | Admitting: Occupational Therapy

## 2017-01-03 ENCOUNTER — Inpatient Hospital Stay (HOSPITAL_COMMUNITY): Payer: Self-pay | Admitting: Physical Therapy

## 2017-01-03 ENCOUNTER — Inpatient Hospital Stay (HOSPITAL_COMMUNITY): Payer: Medicaid Other | Admitting: Speech Pathology

## 2017-01-03 NOTE — Progress Notes (Signed)
Subjective/Complaints:  Pt seen laying ni bed this Am.  She slept well overnight.  Noted to be moving LUE on arrival.   ROS: Denies CP, SOB, N/V/D   Objective: Vital Signs: Blood pressure 104/60, pulse 82, temperature 98.9 F (37.2 C), temperature source Oral, resp. rate 17, height 5\' 3"  (1.6 m), weight 86.8 kg (191 lb 5.4 oz), SpO2 98 %. No results found. No results found for this or any previous visit (from the past 72 hour(s)).   Gen NAD. Vital signs reivewed HEENT: Normocephalic, atraumatic Cardio: RRR and no JVD Resp: CTA B/L and Unlabored GI: BS positive and ND Musc/Skel:  No edema, no tenderness Neuro: Alert Motor: 4-/5 Left HF KE, 0/5 at ankle 0/5 in LUE to command, however, moving against gravity on arrival this AM. Spastic tone throughout Skin:   Intact. Warm and dry. Right knee without erythema, warmth  Assessment/Plan: 1. Functional deficits secondary to Right MCA infarct which require 3+ hours per day of interdisciplinary therapy in a comprehensive inpatient rehab setting. Physiatrist is providing close team supervision and 24 hour management of active medical problems listed below. Physiatrist and rehab team continue to assess barriers to discharge/monitor patient progress toward functional and medical goals. FIM: Function - Bathing Bathing activity did not occur: Refused Position: Shower Body parts bathed by patient: Left arm, Chest, Abdomen, Front perineal area, Right upper leg, Left upper leg, Right lower leg, Left lower leg Body parts bathed by helper: Right arm, Buttocks, Back Assist Level: Touching or steadying assistance(Pt > 75%)  Function- Upper Body Dressing/Undressing What is the patient wearing?: Pull over shirt/dress, Bra Bra - Perfomed by patient: Thread/unthread left bra strap, Thread/unthread right bra strap Bra - Perfomed by helper: Hook/unhook bra (pull down sports bra) Pull over shirt/dress - Perfomed by patient: Thread/unthread right  sleeve, Thread/unthread left sleeve, Put head through opening, Pull shirt over trunk Pull over shirt/dress - Perfomed by helper: Thread/unthread left sleeve, Pull shirt over trunk Assist Level: Touching or steadying assistance(Pt > 75%) Function - Lower Body Dressing/Undressing What is the patient wearing?: Underwear, Pants, Socks, Shoes, AFO Position: Wheelchair/chair at sink Underwear - Performed by patient: Thread/unthread right underwear leg, Thread/unthread left underwear leg Underwear - Performed by helper: Pull underwear up/down Pants- Performed by patient: Thread/unthread right pants leg, Thread/unthread left pants leg Pants- Performed by helper: Pull pants up/down Non-skid slipper socks- Performed by helper: Don/doff right sock, Don/doff left sock Socks - Performed by patient: Don/doff right sock Socks - Performed by helper: Don/doff left sock Shoes - Performed by patient: Don/doff right shoe, Fasten right Shoes - Performed by helper: Don/doff left shoe, Fasten left AFO - Performed by helper: Don/doff left AFO Assist for footwear: Partial/moderate assist Assist for lower body dressing: Touching or steadying assistance (Pt > 75%)  Function - Toileting Toileting activity did not occur: No continent bowel/bladder event Toileting steps completed by patient: Performs perineal hygiene Toileting steps completed by helper: Adjust clothing prior to toileting, Performs perineal hygiene, Adjust clothing after toileting Toileting Assistive Devices: Grab bar or rail Assist level: Touching or steadying assistance (Pt.75%)  Function - ArchivistToilet Transfers Toilet transfer assistive device: Bedside commode Assist level to toilet: Touching or steadying assistance (Pt > 75%) Assist level from toilet: Touching or steadying assistance (Pt > 75%) Assist level to bedside commode (at bedside): Moderate assist (Pt 50 - 74%/lift or lower) Assist level from bedside commode (at bedside): Moderate assist (Pt  50 - 74%/lift or lower)  Function - Chair/bed transfer Chair/bed transfer  method: Stand pivot Chair/bed transfer assist level: Supervision or verbal cues Chair/bed transfer assistive device: Armrests Chair/bed transfer details: Verbal cues for precautions/safety, Verbal cues for safe use of DME/AE  Function - Locomotion: Wheelchair Will patient use wheelchair at discharge?:  (TBD) Type: Manual Max wheelchair distance:  (150 ft) Assist Level: Supervision or verbal cues Assist Level: Supervision or verbal cues Assist Level: Supervision or verbal cues Turns around,maneuvers to table,bed, and toilet,negotiates 3% grade,maneuvers on rugs and over doorsills: No Function - Locomotion: Ambulation Assistive device: Walker-hemi Max distance: 32ft Assist level: Moderate assist (Pt 50 - 74%) Assist level: Moderate assist (Pt 50 - 74%) Walk 50 feet with 2 turns activity did not occur: Safety/medical concerns Assist level: Moderate assist (Pt 50 - 74%) Walk 150 feet activity did not occur: Safety/medical concerns Assist level: Moderate assist (Pt 50 - 74%) Walk 10 feet on uneven surfaces activity did not occur: Safety/medical concerns  Function - Comprehension Comprehension: Auditory Comprehension assist level: Follows basic conversation/direction with extra time/assistive device  Function - Expression Expression: Verbal Expression assist level: Expresses basic needs/ideas: With extra time/assistive device  Function - Social Interaction Social Interaction assist level: Interacts appropriately with others with medication or extra time (anti-anxiety, antidepressant).  Function - Problem Solving Problem solving assist level: Solves basic 75 - 89% of the time/requires cueing 10 - 24% of the time  Function - Memory Memory assist level: Recognizes or recalls 90% of the time/requires cueing < 10% of the time Patient normally able to recall (first 3 days only): Current season, That he or she is  in a hospital, Staff names and faces, Location of own room   Medical Problem List and Plan: 1. Left hemiparesis secondary to Right MCA infarct  ASA and Plavix  Cont CIR, making progress 2. DVT Prophylaxis/Anticoagulation: Pharmaceutical: Lovenox -cont SCD off lovenox due to excessive bruising/pt request 3. Migraine Headaches/Pain Management: has been getting Toradol since 7/14. On topamax 50 daily.   Sensory dysesthesia LLE improved  4. Mood: LCSW to follow for evaluation and support.  5. Neuropsych: This patient is not fullycapable of making decisions on herown behalf. 6. Skin/Wound Care: routine pressure relief measures  7. Fluids/Electrolytes/Nutrition: Liquids advanced to thins Monitor I/O.  Low alb will supplement with prostat  BMP within acceptable range on 8/8   Labs ordered for tomorrow  Protein supplement changed due to pt preference 8. Dyslipidemia: On Lipitor. 9. Thyroid nodule: follow up on outpatient basis 10. Obesity: Educate on importance of weight loss with appropriate diet to promote health and mobility.  11. Question antiphospholipid syndrome: high Beta 2-glycoprotein IgG. Will need recheck labs in 12 weeks. 12.  Spasticity LUE and LLE   resting hand splint   Cont tizanidine 4mg  qhs  Will consider further meds if necessary 13.  Constipation: Improving   Cont miralax 15.  Urinary incont: Improving  Cont toileting program 16. Fall on 8/10  Assisted fall  Frequent checks  Cont to monitor  LOS (Days) 24 A FACE TO FACE EVALUATION WAS PERFORMED  Endrit Gittins Karis Juba 01/03/2017, 8:20 AM

## 2017-01-03 NOTE — Progress Notes (Signed)
Physical Therapy Session Note  Patient Details  Name: Shannon Erickson MRN: [id number] Date of Birth: 11-17-1975  Today's Date: 01/03/2017 PT Individual Time: 1100-1200 PT Individual Time Calculation (min): 60 min   Short Term Goals: Week 3:  PT Short Term Goal 1 (Week 3): Pt will ambulate 144f with mod A with LRAD PT Short Term Goal 2 (Week 3): Pt will perform bed mobility without bed rails and without HOB elevated with min A PT Short Term Goal 3 (Week 3): Pt will ascend/descend 12 stairs with mod A  Skilled Therapeutic Interventions/Progress Updates:   Pt in w/c upon arrival and agreeable to therapy, no c/o pain, daughter present throughout session.   Worked on [place] session w/ dynamic sitting and standing activities. Dynamic sitting w/ supervision to reach outside of BOS w/ emphasis on L weight-shifting, postural trunk control, and trunk rotation bilaterally. Dynamic standing balance w/ Min A to block LLE and participate in unilateral UE tasks w/o UE support. Emphasis also on L weight-shifting, postural control, and trunk rotation. L 3" step up w/ hemi-walker support, 3x10, tactile and verbal cues for L quad activation.   Pt self-propelled w/c to therapy gym w/ supervision to work on functional endurance. Ambulated w/ Min guard w/ hemi-walker 463 x2 w/ verbal cues for gait pattern.   Ended session in w/c in care of daughter, all needs met.   Therapy Documentation Precautions:  Precautions Precautions: Fall Precaution Comments: L hemiplegia Restrictions Weight Bearing Restrictions: No Pain: Pain Assessment Pain Assessment: No/denies pain  See Function Navigator for Current Functional Status.   Therapy/Group: Individual Therapy  [name] K Arnette 01/03/2017, 12:01 PM

## 2017-01-03 NOTE — Progress Notes (Signed)
Occupational Therapy Session Note  Patient Details  Name: Shannon Erickson MRN: [id number] Date of Birth: Jul 01, 1975  Today's Date: 01/03/2017  Session 1 OT Individual Time: 0100-7121 OT Individual Time Calculation (min): 60 min   Session 2 OT Individual Time: 1500-1530 OT Individual Time Calculation (min): 30 min    Short Term Goals: Week 3:  OT Short Term Goal 1 (Week 3): Pt will demonstrate improved LUE body awareness by using R hand to position L arm prior to transfers. OT Short Term Goal 2 (Week 3): Pt will complete shower transfers with consistent supervision and out of shower OT Short Term Goal 3 (Week 3): Pt will don AFO with min A OT Short Term Goal 4 (Week 3): Pt will thread both pant legs with min A  Skilled Therapeutic Interventions/Progress Updates:  Session 1   Worked on coming to modified circle sit to don socks with pt needing min A to maintain balance. Pt completed stand-pivot transfers bed>wc>shower chair>wc with min A. Facilitated weight bearing through LLE during transfers and standing activities. Bathing completed with overall min A, able to utilize long handled sponge and modified strategies. Worked on increased independence with LB dressing using long-handled reacher to increase access to lower body. Educated pt on using L UE as stabilizer for clothing with pt demonstrating understanding. Pt with improved weight bearing through L LE with sit<>stand to pull up pants. Educated pt's daughter on ROM and massage techniques to decrease L UE tone. Pt left seated in wc at end of session with daughter transporting pt to SLP session.  Session 2 OT treatment session focused on L UE NMR. Pt came to standing at raised table with facilitation for LLE weight bearing. Utilized shaving cream for sensory input and facilitated weight bearing through L UE to wipe off shaving cream. Pt with increased distal shoulder movement today and was able to activate shoulder FF, horizontal  adduction. Pt returned to room at end of session and left seated in recliner with needs met.   Therapy Documentation Precautions:  Precautions Precautions: Fall Precaution Comments: L hemiplegia Restrictions Weight Bearing Restrictions: No Pain:  none/denies pain  See Function Navigator for Current Functional Status.   Therapy/Group: Individual Therapy  Valma Cava 01/03/2017, 4:02 PM

## 2017-01-03 NOTE — Progress Notes (Signed)
Speech Language Pathology Daily Session Note  Patient Details  Name: Shannon Erickson Imhof MRN: [id number] Date of Birth: 01/29/1976  Today's Date: 01/03/2017 SLP Concurrent Time: [id number] SLP Concurrent Time Calculation (min): 45 min   Short Term Goals: Week 4: SLP Short Term Goal 1 (Week 4): Pt will use external memory aids to recall new daily information with Mod I.  SLP Short Term Goal 2 (Week 4): Pt will complete semi-complex problem solving tasks with Supervision verbal cues.  SLP Short Term Goal 3 (Week 4): Pt will scan to left of her environment with Mod I to locate objects during functional tasks.  SLP Short Term Goal 4 (Week 4): Pt will demonstrate anticipatory awareness by listing 3 activities that are safe for her to participate in at discharge with Mod I.   Skilled Therapeutic Interventions: Skilled treatment session focused on cognition goals. SLP facilitated session by providing Min A faded to supervision cues to provide complete object description in barrier game with another pt. Pt able to effectively describe and problem solve breakdown in communication with supervision cues. Pt returned to room, left upright in wheelchair with daughter present. Continue per current plan of care.      Function:  Eating Eating                 Cognition Comprehension Comprehension assist level: Follows basic conversation/direction with no assist  Expression   Expression assist level: Expresses basic needs/ideas: With no assist  Social Interaction Social Interaction assist level: Interacts appropriately with others with medication or extra time (anti-anxiety, antidepressant).  Problem Solving Problem solving assist level: Solves complex 90% of the time/cues < 10% of the time;Solves basic problems with no assist  Memory Memory assist level: More than reasonable amount of time    Pain    Therapy/Group: Individual Therapy  Kimmi Acocella 01/03/2017, 10:59 AM

## 2017-01-04 ENCOUNTER — Inpatient Hospital Stay (HOSPITAL_COMMUNITY): Payer: Self-pay | Admitting: Physical Therapy

## 2017-01-04 ENCOUNTER — Inpatient Hospital Stay (HOSPITAL_COMMUNITY): Payer: Self-pay

## 2017-01-04 ENCOUNTER — Inpatient Hospital Stay (HOSPITAL_COMMUNITY): Payer: Self-pay | Admitting: Occupational Therapy

## 2017-01-04 DIAGNOSIS — N3941 Urge incontinence: Secondary | ICD-10-CM

## 2017-01-04 LAB — BASIC METABOLIC PANEL
ANION GAP: 7 (ref 5–15)
BUN: 12 mg/dL (ref 6–20)
CALCIUM: 8.4 mg/dL — AB (ref 8.9–10.3)
CHLORIDE: 108 mmol/L (ref 101–111)
CO2: 25 mmol/L (ref 22–32)
Creatinine, Ser: 0.91 mg/dL (ref 0.44–1.00)
GFR calc non Af Amer: >60 mL/min (ref >60)
GLUCOSE: 116 mg/dL — AB (ref 65–99)
POTASSIUM: 4.1 mmol/L (ref 3.5–5.1)
Sodium: 140 mmol/L (ref 135–145)

## 2017-01-04 LAB — CBC
HEMATOCRIT: 38.1 % (ref 36.0–46.0)
HEMOGLOBIN: 12.2 g/dL (ref 12.0–15.0)
MCH: 27.2 pg (ref 26.0–34.0)
MCHC: 32 g/dL (ref 30.0–36.0)
MCV: 85 fL (ref 78.0–100.0)
Platelets: 269 10*3/uL (ref 150–400)
RBC: 4.48 MIL/uL (ref 3.87–5.11)
RDW: 13.6 % (ref 11.5–15.5)
WBC: 5.4 10*3/uL (ref 4.0–10.5)

## 2017-01-04 NOTE — Progress Notes (Signed)
Physical Therapy Weekly Progress Note  Patient Details  Name: Shannon Erickson MRN: [id number] Date of Birth: 1975-11-29  Beginning of progress report period: December 26, 2016 End of progress report period: January 04, 2017  Today's Date: 01/04/2017 PT Individual Time: 1415-1530 PT Individual Time Calculation (min): 75 min   Patient has met 2 of 3 short term goals.  Pt is making good progress towards LTGs.  Patient continues to demonstrate the following deficits muscle weakness, decreased cardiorespiratoy endurance, impaired timing and sequencing, abnormal tone, unbalanced muscle activation and decreased coordination, decreased midline orientation, decreased safety awareness and decreased sitting balance, decreased standing balance, decreased postural control, hemiplegia and decreased balance strategies and therefore will continue to benefit from skilled PT intervention to increase functional independence with mobility.  Patient progressing toward long term goals..  Continue plan of care.  PT Short Term Goals Week 3:  PT Short Term Goal 1 (Week 3): Pt will ambulate 164f with mod A with LRAD PT Short Term Goal 1 - Progress (Week 3): Partly met (not met distance, has met assist level) PT Short Term Goal 2 (Week 3): Pt will perform bed mobility without bed rails and without HOB elevated with min A PT Short Term Goal 2 - Progress (Week 3): Met PT Short Term Goal 3 (Week 3): Pt will ascend/descend 12 stairs with mod A PT Short Term Goal 3 - Progress (Week 3): Met Week 4:  PT Short Term Goal 1 (Week 4): Equal LTGs  Skilled Therapeutic Interventions/Progress Updates:    no c/o pain. PT treatment session focused on L LE NMR during functional activities and gait, stair negotiation, midline orientation in standing, and L UE NMR.   Pt sitting in w/c upon arrival with daughter present, agreeable to PT session. Daughter present for entire session. Stand pivot transfer w/c <> kinetron with min assist  for pivoting. Sit to stands performed on kinetron with focus on increased L LE use and weight bearing to come to standing with mirror feedback for posture. PT provided min-mod assist for lifting to come to standing with facilitation at L knee for increased weight bearing and tactile facilitation at L glute for hip extension. PT provided min - mod assist for standing balance while pt maintained L foot pedal to floor with verbal and tactile cues for L weight shift, L knee extension, and  upright posture. Ascended/descended 8 (3 inch) stairs with mod assist for balance and verbal cues for LE sequencing and R UE placement on rail with facilitation to prevent L hip external rotation when descending stairs. Ascended/descended 12 stairs (3 and 6 inch) with mod assist for balance and verbal cues for sequencing and R UE placement. Squat pivot w/c <> mat with close supervision for safety. Sit to stands at mat with R LE on step to increase L LE use and weight bearing with mod assist and mirror feedback for posture and midline orientation. Standing with R LE on step with verbal, visual, and tactile cues for L weight shift and midline orientation with upright posture. Sitting L UE pushing and pulling a towel on table focused on NMR with PT providing min assist for greater ROM. Ambulated 835fwithout AD and mod assist for balance with verbal cues for L LE step length and tactile cues for L weight shift. Pt rolled back to room in w/c for time and left in w/c with call bell in reach and daughter present.    Therapy Documentation Precautions:  Precautions Precautions: Fall Precaution Comments: L  hemiplegia Restrictions Weight Bearing Restrictions: No   See Function Navigator for Current Functional Status.  Therapy/Group: Individual Therapy  Jesse Nosbisch 01/04/2017, 5:06 PM

## 2017-01-04 NOTE — Patient Care Conference (Signed)
Inpatient RehabilitationTeam Conference and Plan of Care Update Date: 01/04/2017   Time: 11:05 AM    Patient Name: Shannon Erickson      Medical Record Number: [id number]  Date of Birth: [birth year] Sex: Female         Room/Bed: 4M04C/4M04C-01 Payor Info: Payor: MEDICAID POTENTIAL / Plan: MEDICAID POTENTIAL / Product Type: *No Product type* /    Admitting Diagnosis: Right CVA  Admit Date/Time:  12/10/2016  4:39 PM Admission Comments: No comment available   Primary Diagnosis:  Hemiparesis affecting left side as late effect of cerebrovascular accident [facility]) Principal Problem: Hemiparesis affecting left side as late effect of cerebrovascular accident [facility])  Patient Active Problem List   Diagnosis Date Noted  . Absence of bladder continence   . Fall against object   . Spastic hemiparesis (HCC)   . Acute rhinitis   . Slow transit constipation   . 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 Gait disturbance, post-stroke 12/10/2016  . Hemiparesis affecting left side as late effect of cerebrovascular accident (HCC)   . Left-sided visual neglect   . Antiphospholipid syndrome (HCC)   . Pain   . Prediabetes   . Dysphagia, post-stroke   . Carotid atherosclerosis, bilateral   . Thyroid nodule   . Hyperlipidemia   . Chronic migraine without aura without status migrainosus, not intractable   . Headache 12/03/2016  . Hypokalemia 12/03/2016  . 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 Left-sided weakness     Expected Discharge Date: Expected Discharge Date: 01/14/17  Team Members Present: Physician leading conference: Dr. Claudette Laws Social Worker Present: Staci Acosta, LCSW Nurse Present: Chrissie Noa, RN PT Present: Teodoro Kil, PT OT Present: Kearney Hard, OT SLP Present: Colin Benton, SLP PPS Coordinator present : Tora Duck, RN, CRRN     Current Status/Progress Goal Weekly Team Focus  Medical   Continence, improving, still with  some spasticity, although improving as well.  Maintaining medical stability, avoid syncope  Discharge planning   Bowel/Bladder   continent of bowel & bladder, LBM 01/01/17  remain continent  continue to monitor & assist as needed   Swallow/Nutrition/ Hydration             ADL's   Min A for ADLs and transfers  Min A/supervision  Pt/family ed, L UE NMR, NMES, standing balance,    Mobility   Min A-supervision bed mobility and stand pivot transfers, Min A ambulation 45' w/ HW, Mod A stairs, supervision w/c  supervision bed mobility, min A ambulation and stairs, supervision w/c  LLE and trunk NMR, dynamic sitting and standing balance, midline orientation in standing, gait training    Communication             Safety/Cognition/ Behavioral Observations  Min A   Supervision  Awareness, problem solving, memory   Pain   No c/o pain on this shift, has tylenol & tramadol prn, family does not want tramadol or other opiods, she also has scheduled zanaflex & topamax  pain scale <3  continue to assess & treat as needed   Skin   various bruises to bil arms, no areas of skin break down  no new areas of skin break down  assess q shift    Rehab Goals Patient on target to meet rehab goals: Yes Rehab Goals Revised: none *See Care Plan and progress notes for long and short-term goals.     Barriers to Discharge  Current Status/Progress Possible Resolutions Date Resolved  Physician    Inaccessible home environment  Established new residence  Progressing toward discharge goals  Caregiver education      Nursing                  PT  Home environment access/layout                 OT                  SLP                SW                Discharge Planning/Teaching Needs:  Pt plans to go home with her 41 y/o dtr and 41 y/o son who will be with her to provide assistance.  Dtr is prepared to stay in [place] with pt for up to a year, if need be. Pt will initially go back to her home and then will   Dtr is  being trained by therapists now.     Team Discussion:  Pt is doing well and hasn't had much pain - takes only tylenol when needed.  Pt is mostly continent with some occasional bladder incontinence.  Pt to go on an outing.  Family education with dtr and pt is min A overall with good movement coming back into her shoulder.  Pt is min A for bed mobility and transfers.  Using hemi walker with min A for ambulation and mod A stairs.  Pt is min A with ST for awareness, problem solving, memory.  Has supervision level goals.  Revisions to Treatment Plan:  none    Continued Need for Acute Rehabilitation Level of Care: The patient requires daily medical management by a physician with specialized training in physical medicine and rehabilitation for the following conditions: Daily direction of a multidisciplinary physical rehabilitation program to ensure safe treatment while eliciting the highest outcome that is of practical value to the patient.: Yes Daily medical management of patient stability for increased activity during participation in an intensive rehabilitation regime.: Yes Daily analysis of laboratory values and/or radiology reports with any subsequent need for medication adjustment of medical intervention for : Neurological problems  [name], [name] 01/04/2017, 1:15 PM

## 2017-01-04 NOTE — Progress Notes (Signed)
Speech Language Pathology Daily Session Note  Patient Details  Name: Shannon Erickson MRN: [id number] Date of Birth: 07/07/1975  Today's Date: 01/04/2017 SLP Individual Time: 1000-1030 SLP Individual Time Calculation (min): 30 min  Short Term Goals: Week 4: SLP Short Term Goal 1 (Week 4): Pt will use external memory aids to recall new daily information with Mod I.  SLP Short Term Goal 2 (Week 4): Pt will complete semi-complex problem solving tasks with Supervision verbal cues.  SLP Short Term Goal 3 (Week 4): Pt will scan to left of her environment with Mod I to locate objects during functional tasks.  SLP Short Term Goal 4 (Week 4): Pt will demonstrate anticipatory awareness by listing 3 activities that are safe for her to participate in at discharge with Mod I.   Skilled Therapeutic Interventions: Skilled [facility] services focused on congitive goals. SLP facilitated recall of novel and daily information given 30 minute delay utilizing therapy schedule demonstrating Mod I. SLP facilitated aniticpatory awareness task when returning home, requiring min-mod A verbal and question cues. SLP educated on returning home and starting familiar task at basic level, for example cooking, problem solving dangers when cooking with current impairments. PT was left in room with daughter and call bell within reach. Recommend to continue [facility] services.      Function:  Eating Eating                 Cognition Comprehension Comprehension assist level: Follows basic conversation/direction with no assist  Expression   Expression assist level: Expresses basic needs/ideas: With no assist  Social Interaction Social Interaction assist level: Interacts appropriately with others with medication or extra time (anti-anxiety, antidepressant).  Problem Solving Problem solving assist level: Solves complex 90% of the time/cues < 10% of the time;Solves basic problems with no assist  Memory Memory assist level: More than  reasonable amount of time    Pain Pain Assessment Pain Assessment: No/denies pain  Therapy/Group: Individual Therapy  Kaydon Husby  [facility] 01/04/2017, 5:20 PM

## 2017-01-04 NOTE — Progress Notes (Signed)
Physical Therapy Session Note  Patient Details  Name: Shannon Erickson MRN: [id number] Date of Birth: 11/02/1975  Today's Date: 01/04/2017 PT Individual Time: 1130-1200 PT Individual Time Calculation (min): 30 min   Short Term Goals: Week 3:  PT Short Term Goal 1 (Week 3): Pt will ambulate 17550ft with mod A with LRAD PT Short Term Goal 2 (Week 3): Pt will perform bed mobility without bed rails and without HOB elevated with min A PT Short Term Goal 3 (Week 3): Pt will ascend/descend 12 stairs with mod A  Skilled Therapeutic Interventions/Progress Updates:    pt c/o "tailbone" pain, no number given. PT treatment session focused on L LE NMR during ambulation and exercises.  Pt sitting in w/c upon arrival with daughter present, agreeable to PT session. Daughter present for entire session. PT rolled pt to/form gym in w/c for energy conservation and time. Ambulated 4780ft x 2, seated break between, with mod assist (pt doing 70%) for balance and trunk control. PT provided verbal and tactile cues for upright posture and L weight shift during stance phase. Squat pivot w/c <> mat with min guard assist for safety. Sit <> supine with supervision and verbal cues for sequencing and technique. Supine bridges progressed to single L LE bridges 2 sets of 15 with facilitation for L knee in midline and tactile cues for L glute activation. Pt returned to room in w/c with call bell in reach and daughter present.    Therapy Documentation Precautions:  Precautions Precautions: Fall Precaution Comments: L hemiplegia Restrictions Weight Bearing Restrictions: No   See Function Navigator for Current Functional Status.   Therapy/Group: Individual Therapy  Alleen Kehm 01/04/2017, 12:17 PM

## 2017-01-04 NOTE — Progress Notes (Signed)
Occupational Therapy Weekly Progress Note  Patient Details  Name: Shannon Erickson MRN: [id number] Date of Birth: Jul 07, 1975  Beginning of progress report period: December 11, 2016 End of progress report period: January 04, 2017  Today's Date: 01/04/2017 OT Individual Time: 0900-1000 OT Individual Time Calculation (min): 60 min    Patient has met 3 of 4 short term goals.  Pt Is making great progress with OT at this time. She has progressed to Min A/supervision level transfers and daughter has began training. Pt's L UE tone has improved and she has increased shoulder activation. Pt is progressing with ways to use L UE functionally.   Patient continues to demonstrate the following deficits: muscle weakness, abnormal tone and unbalanced muscle activation, decreased attention to left and decreased sitting balance, decreased standing balance, decreased postural control, hemiplegia and decreased balance strategies and therefore will continue to benefit from skilled OT intervention to enhance overall performance with BADL.  Patient progressing toward long term goals..  Continue plan of care.  OT Short Term Goals Week 3:  OT Short Term Goal 1 (Week 3): Pt will demonstrate improved LUE body awareness by using R hand to position L arm prior to transfers. OT Short Term Goal 1 - Progress (Week 3): Met OT Short Term Goal 2 (Week 3): Pt will complete shower transfers with consistent min A in and out of shower OT Short Term Goal 2 - Progress (Week 3): Met OT Short Term Goal 3 (Week 3): Pt will don AFO with min A OT Short Term Goal 3 - Progress (Week 3): Progressing toward goal OT Short Term Goal 4 (Week 3): Pt will thread both pant legs with min A OT Short Term Goal 4 - Progress (Week 3): Met Week 4:  OT Short Term Goal 1 (Week 4): Pt will don AFO with min A OT Short Term Goal 2 (Week 4): Pt's daughter will assist pt with shower transfers without cues OT Short Term Goal 3 (Week 4): Pt will maintain  standing balance with 75% supervision during ADL task OT Short Term Goal 4 (Week 4): Pt will complete 2/3 toileting steps with supervision   Skilled Therapeutic Interventions/Progress Updates:    OT treatment session focused on wc positioning L NMR and NMES. Pt reports her tailbone is sore from sitting in wc. Changed wc cushion for increased sacral cushion. Pt came to standing at standing table with facilitation for weight shifting onto LLE L NMR with weight bearing through L UE with towel pushes. Pt with increased shoulder activation noted. 1:1 NMES applied to supraspinatus and middle deltoid as well as wrist extensors.  Ratio 1:1 Rate 35 pps Waveform- Asymmetric Ramp 1.0 Pulse 300 Intensity-17  Duration - 15 mins    No adverse reactions after treatment and is skin intact.   Continued to facilitate weight bearing through elbow/forearm to push towels during NMES. Pt returned to room at end of session and left seated in wc with needs met.   Therapy Documentation Precautions:  Precautions Precautions: Fall Precaution Comments: L hemiplegia Restrictions Weight Bearing Restrictions: No Pain: Pain Assessment Pain Assessment: No/denies pain Pain Score: 0-No pain  See Function Navigator for Current Functional Status.   Therapy/Group: Individual Therapy  Valma Cava 01/04/2017, 9:53 AM

## 2017-01-04 NOTE — Progress Notes (Signed)
Subjective/Complaints:   No issues overnite, no arm pain, pt denies numbness  ROS: Denies CP, SOB, N/V/D   Objective: Vital Signs: Blood pressure 106/76, pulse 79, temperature 98 F (36.7 C), temperature source Oral, resp. rate 18, height _0  (1.6 m), weight 88 kg (193 lb 15.6 oz), SpO2 98 %. No results found. Results for orders placed or performed during the hospital encounter of 12/10/16 (from the past 72 hour(s))  Basic metabolic panel     Status: Abnormal   Collection Time: 01/04/17  4:54 AM  Result Value Ref Range   Sodium 140 135 - 145 mmol/L   Potassium 4.1 3.5 - 5.1 mmol/L   Chloride 108 101 - 111 mmol/L   CO2 25 22 - 32 mmol/L   Glucose, Bld 116 (H) 65 - 99 mg/dL   BUN 12 6 - 20 mg/dL   Creatinine, Ser 0.91 0.44 - 1.00 mg/dL   Calcium 8.4 (L) 8.9 - 10.3 mg/dL   GFR calc non Af Amer >60 >60 mL/min   GFR calc Af Amer >60 >60 mL/min    Comment: (NOTE) The eGFR has been calculated using the CKD EPI equation. This calculation has not been validated in all clinical situations. eGFR's persistently <60 mL/min signify possible Chronic Kidney Disease.    Anion gap 7 5 - 15  CBC     Status: None   Collection Time: 01/04/17  4:54 AM  Result Value Ref Range   WBC 5.4 4.0 - 10.5 K/uL   RBC 4.48 3.87 - 5.11 MIL/uL   Hemoglobin 12.2 12.0 - 15.0 g/dL   HCT 38.1 36.0 - 46.0 %   MCV 85.0 78.0 - 100.0 fL   MCH 27.2 26.0 - 34.0 pg   MCHC 32.0 30.0 - 36.0 g/dL   RDW 13.6 11.5 - 15.5 %   Platelets 269 150 - 400 K/uL     Gen NAD. Vital signs reivewed HEENT: Normocephalic, atraumatic Cardio: RRR and no JVD Resp: CTA B/L and Unlabored GI: BS positive and ND Musc/Skel:  No edema, no tenderness Neuro: Alert Motor: 4-/5 Left HF KE, 0/5 at ankle 0/5 in LUE to command, however, moving against gravity on arrival this AM. Spastic tone throughout Skin:   Intact. Warm and dry. Right knee without erythema, warmth  Assessment/Plan: 1. Functional deficits secondary to Right MCA  infarct which require 3+ hours per day of interdisciplinary therapy in a comprehensive inpatient rehab setting. Physiatrist is providing close team supervision and 24 hour management of active medical problems listed below. Physiatrist and rehab team continue to assess barriers to discharge/monitor patient progress toward functional and medical goals. FIM: Function - Bathing Bathing activity did not occur: Refused Position: Shower Body parts bathed by patient: Left arm, Chest, Abdomen, Front perineal area, Right upper leg, Left upper leg, Right lower leg, Left lower leg Body parts bathed by helper: Right arm, Buttocks, Back Assist Level: Touching or steadying assistance(Pt > 75%)  Function- Upper Body Dressing/Undressing What is the patient wearing?: Pull over shirt/dress, Bra Bra - Perfomed by patient: Thread/unthread left bra strap, Thread/unthread right bra strap Bra - Perfomed by helper: Hook/unhook bra (pull down sports bra) Pull over shirt/dress - Perfomed by patient: Thread/unthread right sleeve, Thread/unthread left sleeve, Put head through opening, Pull shirt over trunk Pull over shirt/dress - Perfomed by helper: Thread/unthread left sleeve, Pull shirt over trunk Assist Level: Touching or steadying assistance(Pt > 75%) Function - Lower Body Dressing/Undressing What is the patient wearing?: Underwear, Pants, Socks, Shoes, AFO  Position: Wheelchair/chair at sink Underwear - Performed by patient: Thread/unthread right underwear leg, Thread/unthread left underwear leg Underwear - Performed by helper: Pull underwear up/down Pants- Performed by patient: Thread/unthread right pants leg, Thread/unthread left pants leg Pants- Performed by helper: Pull pants up/down Non-skid slipper socks- Performed by helper: Don/doff right sock, Don/doff left sock Socks - Performed by patient: Don/doff right sock Socks - Performed by helper: Don/doff left sock Shoes - Performed by patient: Don/doff right  shoe, Fasten right Shoes - Performed by helper: Don/doff left shoe, Fasten left AFO - Performed by helper: Don/doff left AFO Assist for footwear: Partial/moderate assist Assist for lower body dressing: Touching or steadying assistance (Pt > 75%)  Function - Toileting Toileting activity did not occur: No continent bowel/bladder event Toileting steps completed by patient: Performs perineal hygiene Toileting steps completed by helper: Adjust clothing prior to toileting, Adjust clothing after toileting Toileting Assistive Devices: Grab bar or rail Assist level: Touching or steadying assistance (Pt.75%)  Function - Air cabin crew transfer assistive device: Bedside commode Assist level to toilet: Moderate assist (Pt 50 - 74%/lift or lower) Assist level from toilet: Touching or steadying assistance (Pt > 75%) Assist level to bedside commode (at bedside): Moderate assist (Pt 50 - 74%/lift or lower) Assist level from bedside commode (at bedside): Moderate assist (Pt 50 - 74%/lift or lower)  Function - Chair/bed transfer Chair/bed transfer method: Stand pivot Chair/bed transfer assist level: Touching or steadying assistance (Pt > 75%) Chair/bed transfer assistive device: Armrests Chair/bed transfer details: Verbal cues for precautions/safety, Verbal cues for safe use of DME/AE  Function - Locomotion: Wheelchair Will patient use wheelchair at discharge?:  (TBD) Type: Manual Max wheelchair distance: 150' Assist Level: Supervision or verbal cues Assist Level: Supervision or verbal cues Assist Level: Supervision or verbal cues Turns around,maneuvers to table,bed, and toilet,negotiates 3% grade,maneuvers on rugs and over doorsills: No Function - Locomotion: Ambulation Assistive device: Walker-hemi Max distance: 28' Assist level: Touching or steadying assistance (Pt > 75%) Assist level: Touching or steadying assistance (Pt > 75%) Walk 50 feet with 2 turns activity did not occur:  Safety/medical concerns Assist level: Moderate assist (Pt 50 - 74%) Walk 150 feet activity did not occur: Safety/medical concerns Assist level: Moderate assist (Pt 50 - 74%) Walk 10 feet on uneven surfaces activity did not occur: Safety/medical concerns  Function - Comprehension Comprehension: Auditory Comprehension assist level: Follows basic conversation/direction with no assist  Function - Expression Expression: Verbal Expression assist level: Expresses basic needs/ideas: With no assist  Function - Social Interaction Social Interaction assist level: Interacts appropriately with others with medication or extra time (anti-anxiety, antidepressant).  Function - Problem Solving Problem solving assist level: Solves complex 90% of the time/cues < 10% of the time, Solves basic problems with no assist  Function - Memory Memory assist level: More than reasonable amount of time Patient normally able to recall (first 3 days only): Current season, That he or she is in a hospital, Staff names and faces, Location of own room   Medical Problem List and Plan: 1. Left hemiparesis secondary to Right MCA infarct  ASA and Plavix  Cont CIR, Team conference today please see physician documentation under team conference tab, met with team face-to-face to discuss problems,progress, and goals. Formulized individual treatment plan based on medical history, underlying problem and comorbidities. 2. DVT Prophylaxis/Anticoagulation: Pharmaceutical: Lovenox -cont SCD off lovenox due to excessive bruising/pt request, may d/c weekly CBC now that pt off lovenox 3. Migraine Headaches/Pain Management: has been getting  Toradol since 7/14. On topamax 50 daily.   Sensory dysesthesia LLE improved  4. Mood: LCSW to follow for evaluation and support.  5. Neuropsych: This patient is not fullycapable of making decisions on herown behalf. 6. Skin/Wound Care: routine pressure relief measures  7.  Fluids/Electrolytes/Nutrition: Liquids advanced to thins Monitor I/O.  Low alb will supplement with prostat  BMP normal 8/15   Protein supplement changed due to pt preference 8. Dyslipidemia: On Lipitor. 9. Thyroid nodule: follow up on outpatient basis 10. Obesity: Educate on importance of weight loss with appropriate diet to promote health and mobility.  11. Question antiphospholipid syndrome: high Beta 2-glycoprotein IgG. Will need recheck labs in 12 weeks. 12.  Spasticity LUE and LLE   resting hand splint   Cont tizanidine 39m qhs  No need for Botox at present 13.  Constipation: Improving   Cont miralax 15.  Urinary incont: Improving  Cont toileting program 16. Fall on 8/10  Assisted fall  Frequent checks  Cont to monitor  LOS (Days) 25 A FACE TO FACE EVALUATION WAS PERFORMED  Deloma Spindle E 01/04/2017, 7:57 AM

## 2017-01-05 ENCOUNTER — Inpatient Hospital Stay (HOSPITAL_COMMUNITY): Payer: Self-pay | Admitting: Physical Therapy

## 2017-01-05 ENCOUNTER — Inpatient Hospital Stay (HOSPITAL_COMMUNITY): Payer: Medicaid Other | Admitting: Speech Pathology

## 2017-01-05 ENCOUNTER — Encounter (HOSPITAL_COMMUNITY): Payer: Self-pay | Admitting: Occupational Therapy

## 2017-01-05 ENCOUNTER — Inpatient Hospital Stay (HOSPITAL_COMMUNITY): Payer: Self-pay | Admitting: Speech Pathology

## 2017-01-05 NOTE — Progress Notes (Signed)
Subjective/Complaints:   Discussed recommendation for no driving until cleared by MD  ROS: Denies CP, SOB, N/V/D   Objective: Vital Signs: Blood pressure 98/60, pulse 88, temperature 99 F (37.2 C), temperature source Oral, resp. rate 16, height 5' 3"  (1.6 m), weight 88 kg (193 lb 15.6 oz), SpO2 100 %. No results found. Results for orders placed or performed during the hospital encounter of 12/10/16 (from the past 72 hour(s))  Basic metabolic panel     Status: Abnormal   Collection Time: 01/04/17  4:54 AM  Result Value Ref Range   Sodium 140 135 - 145 mmol/L   Potassium 4.1 3.5 - 5.1 mmol/L   Chloride 108 101 - 111 mmol/L   CO2 25 22 - 32 mmol/L   Glucose, Bld 116 (H) 65 - 99 mg/dL   BUN 12 6 - 20 mg/dL   Creatinine, Ser 0.91 0.44 - 1.00 mg/dL   Calcium 8.4 (L) 8.9 - 10.3 mg/dL   GFR calc non Af Amer >60 >60 mL/min   GFR calc Af Amer >60 >60 mL/min    Comment: (NOTE) The eGFR has been calculated using the CKD EPI equation. This calculation has not been validated in all clinical situations. eGFR's persistently <60 mL/min signify possible Chronic Kidney Disease.    Anion gap 7 5 - 15  CBC     Status: None   Collection Time: 01/04/17  4:54 AM  Result Value Ref Range   WBC 5.4 4.0 - 10.5 K/uL   RBC 4.48 3.87 - 5.11 MIL/uL   Hemoglobin 12.2 12.0 - 15.0 g/dL   HCT 38.1 36.0 - 46.0 %   MCV 85.0 78.0 - 100.0 fL   MCH 27.2 26.0 - 34.0 pg   MCHC 32.0 30.0 - 36.0 g/dL   RDW 13.6 11.5 - 15.5 %   Platelets 269 150 - 400 K/uL     Gen NAD. Vital signs reivewed HEENT: Normocephalic, atraumatic Cardio: RRR and no JVD Resp: CTA B/L and Unlabored GI: BS positive and ND Musc/Skel:  No edema, no tenderness Neuro: Alert Motor: 4-/5 Left HF KE, 0/5 at ankle 0/5 in LUE to command, however, moving against gravity on arrival this AM. Spastic tone throughout Skin:   Intact. Warm and dry. Right knee without erythema, warmth  Assessment/Plan: 1. Functional deficits secondary to Right  MCA infarct which require 3+ hours per day of interdisciplinary therapy in a comprehensive inpatient rehab setting. Physiatrist is providing close team supervision and 24 hour management of active medical problems listed below. Physiatrist and rehab team continue to assess barriers to discharge/monitor patient progress toward functional and medical goals. FIM: Function - Bathing Bathing activity did not occur: Refused Position: Shower Body parts bathed by patient: Left arm, Chest, Abdomen, Front perineal area, Right upper leg, Left upper leg, Right lower leg, Left lower leg Body parts bathed by helper: Right arm, Buttocks, Back Assist Level: Touching or steadying assistance(Pt > 75%)  Function- Upper Body Dressing/Undressing What is the patient wearing?: Pull over shirt/dress, Bra Bra - Perfomed by patient: Thread/unthread left bra strap, Thread/unthread right bra strap Bra - Perfomed by helper: Hook/unhook bra (pull down sports bra) Pull over shirt/dress - Perfomed by patient: Thread/unthread right sleeve, Thread/unthread left sleeve, Put head through opening, Pull shirt over trunk Pull over shirt/dress - Perfomed by helper: Thread/unthread left sleeve, Pull shirt over trunk Assist Level: Touching or steadying assistance(Pt > 75%) Function - Lower Body Dressing/Undressing What is the patient wearing?: Underwear, Pants, Socks, Shoes, AFO  Position: Wheelchair/chair at sink Underwear - Performed by patient: Thread/unthread right underwear leg, Thread/unthread left underwear leg Underwear - Performed by helper: Pull underwear up/down Pants- Performed by patient: Thread/unthread right pants leg, Thread/unthread left pants leg Pants- Performed by helper: Pull pants up/down Non-skid slipper socks- Performed by helper: Don/doff right sock, Don/doff left sock Socks - Performed by patient: Don/doff right sock Socks - Performed by helper: Don/doff left sock Shoes - Performed by patient: Don/doff  right shoe, Fasten right Shoes - Performed by helper: Don/doff left shoe, Fasten left AFO - Performed by helper: Don/doff left AFO Assist for footwear: Partial/moderate assist Assist for lower body dressing: Touching or steadying assistance (Pt > 75%)  Function - Toileting Toileting activity did not occur: No continent bowel/bladder event Toileting steps completed by patient: Performs perineal hygiene Toileting steps completed by helper: Adjust clothing prior to toileting, Adjust clothing after toileting Toileting Assistive Devices: Grab bar or rail Assist level: Touching or steadying assistance (Pt.75%)  Function - Air cabin crew transfer assistive device: Bedside commode Assist level to toilet: Moderate assist (Pt 50 - 74%/lift or lower) Assist level from toilet: Touching or steadying assistance (Pt > 75%) Assist level to bedside commode (at bedside): Moderate assist (Pt 50 - 74%/lift or lower) Assist level from bedside commode (at bedside): Moderate assist (Pt 50 - 74%/lift or lower)  Function - Chair/bed transfer Chair/bed transfer method: Stand pivot Chair/bed transfer assist level: Supervision or verbal cues Chair/bed transfer assistive device: Armrests Chair/bed transfer details: Verbal cues for safe use of DME/AE, Verbal cues for precautions/safety  Function - Locomotion: Wheelchair Will patient use wheelchair at discharge?:  (TBD) Type: Manual Max wheelchair distance: 150' Assist Level: Supervision or verbal cues Assist Level: Supervision or verbal cues Assist Level: Supervision or verbal cues Turns around,maneuvers to table,bed, and toilet,negotiates 3% grade,maneuvers on rugs and over doorsills: No Function - Locomotion: Ambulation Assistive device: No device Max distance: 33f Assist level: Moderate assist (Pt 50 - 74%) Assist level: Moderate assist (Pt 50 - 74%) Walk 50 feet with 2 turns activity did not occur: Safety/medical concerns Assist level:  Moderate assist (Pt 50 - 74%) Walk 150 feet activity did not occur: Safety/medical concerns Assist level: Moderate assist (Pt 50 - 74%) Walk 10 feet on uneven surfaces activity did not occur: Safety/medical concerns  Function - Comprehension Comprehension: Auditory Comprehension assist level: Follows basic conversation/direction with no assist  Function - Expression Expression: Verbal Expression assist level: Expresses basic needs/ideas: With no assist  Function - Social Interaction Social Interaction assist level: Interacts appropriately with others with medication or extra time (anti-anxiety, antidepressant).  Function - Problem Solving Problem solving assist level: Solves complex 90% of the time/cues < 10% of the time, Solves basic problems with no assist  Function - Memory Memory assist level: More than reasonable amount of time Patient normally able to recall (first 3 days only): Current season, That he or she is in a hospital, Staff names and faces, Location of own room   Medical Problem List and Plan: 1. Left hemiparesis secondary to Right MCA infarct  ASA and Plavix  Cont CIR,PT, OT, SLP  2. DVT Prophylaxis/Anticoagulation: Pharmaceutical: Lovenox -cont SCD off lovenox due to excessive bruising/pt request, may d/c weekly CBC now that pt off lovenox 3. Migraine Headaches/Pain Management: has been getting Toradol since 7/14. On topamax 50 daily.   Sensory dysesthesia LLE improved  4. Mood: LCSW to follow for evaluation and support.  5. Neuropsych: This patient is not fullycapable of making  decisions on herown behalf. 6. Skin/Wound Care: routine pressure relief measures  7. Fluids/Electrolytes/Nutrition: Liquids advanced to thins Monitor I/O.  Low alb will supplement with prostat  BMP normal 8/15   Protein supplement changed due to pt preference 8. Dyslipidemia: On Lipitor. 9. Thyroid nodule: follow up on outpatient basis 10. Obesity: Educate on importance of weight  loss with appropriate diet to promote health and mobility.  11. Question antiphospholipid syndrome: high Beta 2-glycoprotein IgG. Will need recheck labs in 12 weeks. 12.  Spasticity LUE and LLE MAS 1 in finger flexors, no flexor withdrawal in LLE  resting hand splint   Cont tizanidine 74m qhs  No need for Botox at present 13.  Constipation: Improving   Cont miralax 15.  Urinary incont: Improving  Cont toileting program 16. Fall on 8/10- no injury  Assisted fall  Frequent checks  Cont to monitor  LOS (Days) 26 A FACE TO FACE EVALUATION WAS PERFORMED  KIRSTEINS,ANDREW E 01/05/2017, 7:20 AM

## 2017-01-05 NOTE — Progress Notes (Signed)
Speech Language Pathology Daily Session Note  Patient Details  Name: Shannon Erickson MRN: [id number] Date of Birth: 04/16/1976  Today's Date: 01/05/2017 SLP Individual Time: 1430-1500 SLP Individual Time Calculation (min): 30 min  Short Term Goals: Week 4: SLP Short Term Goal 1 (Week 4): Pt will use external memory aids to recall new daily information with Mod I.  SLP Short Term Goal 2 (Week 4): Pt will complete semi-complex problem solving tasks with Supervision verbal cues.  SLP Short Term Goal 3 (Week 4): Pt will scan to left of her environment with Mod I to locate objects during functional tasks.  SLP Short Term Goal 4 (Week 4): Pt will demonstrate anticipatory awareness by listing 3 activities that are safe for her to participate in at discharge with Mod I.   Skilled Therapeutic Interventions: Skilled treatment session focused on cognitive goals. Patient generated lists of activities that she will need assistance with and activities that she can perform independently at home. Patient generated list in an e-mail and located letter keys in left field of environment with Mod I but required extra time and Mod A verbal cues to self-monitor spelling errors. Patient left upright in wheelchair with all needs within reach. Continue with current plan of care.      Function:  Cognition Comprehension Comprehension assist level: Follows basic conversation/direction with no assist  Expression   Expression assist level: Expresses basic needs/ideas: With no assist  Social Interaction Social Interaction assist level: Interacts appropriately with others with medication or extra time (anti-anxiety, antidepressant).  Problem Solving Problem solving assist level: Solves basic 90% of the time/requires cueing < 10% of the time  Memory Memory assist level: Recognizes or recalls 90% of the time/requires cueing < 10% of the time    Pain No/Denies Pain   Therapy/Group: Individual Therapy  Dennys Guin,  Jt Brabec 01/05/2017, 4:04 PM

## 2017-01-05 NOTE — Progress Notes (Signed)
Physical Therapy Session Note  Patient Details  Name: Shannon Erickson MRN: [id number] Date of Birth: 06-27-1975  Today's Date: 01/05/2017 PT Individual Time: 3704-8889 PT Individual Time Calculation (min): 30 min   Short Term Goals: Week 4:  PT Short Term Goal 1 (Week 4): Equal LTGs  Skilled Therapeutic Interventions/Progress Updates:    no c/o pain.  Session focus on postural control and NMR.  Pt completes RLE toe taps to dynadisk without UE support focus on upright posture, L weightshift, and smooth/controlled movement.  Pt with occasional recurvatum of L knee.  Gait without device x100', min assist overall with occasional instances of mod for balance.  Pt returned to room at end of session, positioned upright in w/c with call bell in reach and needs met.   Therapy Documentation Precautions:  Precautions Precautions: Fall Precaution Comments: L hemiplegia Restrictions Weight Bearing Restrictions: No   See Function Navigator for Current Functional Status.   Therapy/Group: Individual Therapy  Shannon Erickson 01/05/2017, 4:10 PM

## 2017-01-05 NOTE — Progress Notes (Signed)
Social Work Patient ID: Shannon Erickson, female   DOB: 1976/02/28, 41 y.o.   MRN: [id number]   CSW met with pt and her dtr 01-04-17 to update them on team conference discussion.  Pt's dtr is preparing for pt's d/c on 01-14-17.  They will return to pt's home initially until they find other suitable living arrangements.  Pt/dtr are pleased with pt's progress and looking forward to her going on an outing.  CSW will continue to follow and assist as needed.

## 2017-01-05 NOTE — Progress Notes (Signed)
Occupational Therapy Session Note  Patient Details  Name: Shannon Erickson MRN: [id number] Date of Birth: 10/29/1975  Today's Date: 01/05/2017 OT Individual Time: 1000-1130 OT Individual Time Calculation (min): 90 min   Today's Date: 01/05/2017 OT Concurrent Time: 1400-1430 OT Concurrent Time Calculation (min): 30 min  Short Term Goals: Week 4:  OT Short Term Goal 1 (Week 4): Pt will don AFO with min A OT Short Term Goal 2 (Week 4): Pt's daughter will assist pt with shower transfers without cues OT Short Term Goal 3 (Week 4): Pt will maintain standing balance with 75% supervision during ADL task OT Short Term Goal 4 (Week 4): Pt will complete 2/3 toileting steps with supervision   Skilled Therapeutic Interventions/Progress Updates:  Session 1   OT treatment session focused on community reintegration, pt/family education, and L UE there-ex. Pt completed bathroom transfer to toilet with min A and grab bars. Worked on standing balance w/ facilitation to promote weight bearing through LLE while pt managed clothing on and off. Pt voided bladder and completed peri-care with supervision. W/c propelled total A for time management to wc van. W/c lift used to enter [place] for safety. Pt completed stand-pivot transfer to regular vehicle seat with min A. Discussed community safety awareness, L UE awareness, barriers within community mobility, and community bathroom access. Pt's daughter demonstrated understanding of UE massage and HEP program with gentle ROM exercises while waiting in line. Pt and daughter able to negotiate public building at wc level to communicate with [employer] [employer] [facility] [employer] employees. Pt demonstrated good awareness of L UE in community environment and identified ways to safely access community from wc level. Pt returned to room at end of session and left seated in wc.  Session 2 OT treatment session focused on standing balance/endurance, L NMR, and NMES. Pt stood for 15 mins in standing frame while OT  facilitated reaching to L to promote weightbearing through LLE. 1:1 NMES applied to supraspinatus and middle deltoid Channel 1:, and  Wrist extensors on Channel 2. Incorporated functional exercises of grasp and release with hand over hand A.  Ratio 1:1 Rate 35 pps Waveform- Asymmetric Ramp 1.0 Pulse 300 Intensity- 23   Duration -   15 mins No adverse reactions after treatment and is skin intact.   Pt handoff to SLP for next therapy session.     Therapy Documentation Precautions:  Precautions Precautions: Fall Precaution Comments: L hemiplegia Restrictions Weight Bearing Restrictions: No Pain:   none/denies pain  See Function Navigator for Current Functional Status.   Therapy/Group: Individual Therapy and Concurrent Therapy  Mal Amabilelisabeth S Gerasimos Plotts 01/05/2017, 4:10 PM

## 2017-01-05 NOTE — Progress Notes (Signed)
Speech Language Pathology Daily Session Note  Patient Details  Name: Shannon Erickson MRN: [id number] Date of Birth: 01/27/1976  Today's Date: 01/05/2017 SLP Individual Time: [id number] SLP Individual Time Calculation (min): 25 min  Short Term Goals: Week 4: SLP Short Term Goal 1 (Week 4): Pt will use external memory aids to recall new daily information with Mod I.  SLP Short Term Goal 2 (Week 4): Pt will complete semi-complex problem solving tasks with Supervision verbal cues.  SLP Short Term Goal 3 (Week 4): Pt will scan to left of her environment with Mod I to locate objects during functional tasks.  SLP Short Term Goal 4 (Week 4): Pt will demonstrate anticipatory awareness by listing 3 activities that are safe for her to participate in at discharge with Mod I.   Skilled Therapeutic Interventions:  Pt was seen for skilled ST targeting cognitive goals.  Pt was able to recall activities from previously scheduled ST therapy session with mod I.  Therapist facilitated the session with structured conversations regarding discharge planning to address anticipatory awareness.  Pt was able to identify 2 physical limitations and their impact on her independence in the home environment with supervision question cues.  She endorses that she feels her cognition is "fine."  Pt was able to list at least 3 activities for which she will need assistance at discharge as well as home modifications recommended by PT with supervision question cues.  Pt was left in bed with bed alarm set and call bell within reach.  Continue per current plan of care.       Function:  Eating Eating                 Cognition Comprehension Comprehension assist level: Follows basic conversation/direction with no assist  Expression   Expression assist level: Expresses basic needs/ideas: With no assist  Social Interaction Social Interaction assist level: Interacts appropriately with others with medication or extra time  (anti-anxiety, antidepressant).  Problem Solving Problem solving assist level: Solves basic 90% of the time/requires cueing < 10% of the time  Memory Memory assist level: Recognizes or recalls 90% of the time/requires cueing < 10% of the time    Pain Pain Assessment Pain Assessment: No/denies pain  Therapy/Group: Individual Therapy  Mykah Bellomo, Melanee SpryNicole L 01/05/2017, 8:33 AM

## 2017-01-06 ENCOUNTER — Inpatient Hospital Stay (HOSPITAL_COMMUNITY): Payer: Self-pay | Admitting: Physical Therapy

## 2017-01-06 ENCOUNTER — Inpatient Hospital Stay (HOSPITAL_COMMUNITY): Payer: Self-pay | Admitting: Occupational Therapy

## 2017-01-06 ENCOUNTER — Inpatient Hospital Stay (HOSPITAL_COMMUNITY): Payer: Medicaid Other | Admitting: Speech Pathology

## 2017-01-06 MED ORDER — TIZANIDINE HCL 2 MG PO TABS
6.0000 mg | ORAL_TABLET | Freq: Every day | ORAL | Status: DC
Start: 2017-01-06 — End: 2017-01-14
  Administered 2017-01-06 – 2017-01-13 (×8): 6 mg via ORAL
  Filled 2017-01-06 (×8): qty 3

## 2017-01-06 NOTE — Progress Notes (Signed)
Occupational Therapy Session Note  Patient Details  Name: Shannon Erickson MRN: [id number] Date of Birth: 04-18-1976  Today's Date: 01/06/2017 OT Individual Time: 0901-1000 OT Individual Time Calculation (min): 59 min    Short Term Goals: Week 4:  OT Short Term Goal 1 (Week 4): Pt will don AFO with min A OT Short Term Goal 2 (Week 4): Pt's daughter will assist pt with shower transfers without cues OT Short Term Goal 3 (Week 4): Pt will maintain standing balance with 75% supervision during ADL task OT Short Term Goal 4 (Week 4): Pt will complete 2/3 toileting steps with supervision   Skilled Therapeutic Interventions/Progress Updates:    Pt transferred onto toilet with min A and facilitation for weight bearing through L LE. Pt voided bladder and completed toileting with set-up A. Pt then completed LB dressing from wc using reacher w/ R UE and VC for L UE used a stabilizer to hold pants. 1:1 NMES placed on wrist extensors. Added functional task of reaching and grasping cups. Pt needed assist to extend fingers around cup, then able to maintain grasp 2/2 tone and move cup across table actively, then assist to release cup. Pt completed task in standing at standing frame. Pt returned to room at end of session with daughter present.    Therapy Documentation Precautions:  Precautions Precautions: Fall Precaution Comments: L hemiplegia Restrictions Weight Bearing Restrictions: No  See Function Navigator for Current Functional Status.   Therapy/Group: Individual Therapy  Mal Amabile 01/06/2017, 9:48 AM

## 2017-01-06 NOTE — Progress Notes (Signed)
Physical Therapy Session Note  Patient Details  Name: Shannon Erickson MRN: [id number] Date of Birth: Sep 22, 1975  Today's Date: 01/06/2017 PT Individual Time: 1300-1415 PT Individual Time Calculation (min): 75 min   Short Term Goals: Week 4:  PT Short Term Goal 1 (Week 4): Equal LTGs  Skilled Therapeutic Interventions/Progress Updates:    no c/o pain. PT treatment session focused on L LE NMR during pre-gait and ambulation, L UE NMR with NMES.  Pt sitting in w/c upon arrival with RN and daughter present, agreeable to PT session. Daughter present for entire session. Rolled pt in w/c to/from gym for energy conservation and time. Squat pivot w/c <> mat with close supervision and verbal cues for w/c positioning, breaks, and arm rest removal. Sit to stands throughout session with focus on L LE weight bearing and midline orientation upon standing with min assist for steadying. PT provided manual facilitation for weight bearing through L LE and tactile cues for L weight shift upon standing. Pre-gait performed with mirror feedback for midline orientation, upright posture, and forward hip alignment. Pt stepping forward and back with R LE 3x to fatigue with min assist for balance, occasional mod assist with increased fatigue, with verbal cues for sequencing and tactile cues for L weight shift, hip forward alignment, and L knee extension. Discussed removal of heel lift from L shoe, but noticed increased L knee recurvatum during weight bearing without it and recommended continuing to use it. Ambulated 57ft x 2, seated break between, with min assist for balance and PT providing tactile cues for L weight shift during stance and verbal cues for L LE clearance during swing. Seated L UE NMR via NMES to the wrist extensors for with attempts to grasp cups and the elbow extensors for 5 minutes with PT providing shoulder abduction support. No side effects noted after electrical stimulation with skin intact. Pt  returned to room and left in w/c with call bell in reach and daughter present.  Therapy Documentation Precautions:  Precautions Precautions: Fall Precaution Comments: L hemiplegia Restrictions Weight Bearing Restrictions: No   See Function Navigator for Current Functional Status.   Therapy/Group: Individual Therapy  Kristyanna Barcelo 01/06/2017, 3:32 PM

## 2017-01-06 NOTE — Progress Notes (Signed)
Subjective/Complaints:   No issues overnite except Left calf spasms  ROS: Denies CP, SOB, N/V/D   Objective: Vital Signs: Blood pressure (!) 94/51, pulse 82, temperature (!) 97.5 F (36.4 C), temperature source Oral, resp. rate 12, height 5' 3" (1.6 m), weight 88 kg (193 lb 15.6 oz), SpO2 99 %. No results found. Results for orders placed or performed during the hospital encounter of 12/10/16 (from the past 72 hour(s))  Basic metabolic panel     Status: Abnormal   Collection Time: 01/04/17  4:54 AM  Result Value Ref Range   Sodium 140 135 - 145 mmol/L   Potassium 4.1 3.5 - 5.1 mmol/L   Chloride 108 101 - 111 mmol/L   CO2 25 22 - 32 mmol/L   Glucose, Bld 116 (H) 65 - 99 mg/dL   BUN 12 6 - 20 mg/dL   Creatinine, Ser 0.91 0.44 - 1.00 mg/dL   Calcium 8.4 (L) 8.9 - 10.3 mg/dL   GFR calc non Af Amer >60 >60 mL/min   GFR calc Af Amer >60 >60 mL/min    Comment: (NOTE) The eGFR has been calculated using the CKD EPI equation. This calculation has not been validated in all clinical situations. eGFR's persistently <60 mL/min signify possible Chronic Kidney Disease.    Anion gap 7 5 - 15  CBC     Status: None   Collection Time: 01/04/17  4:54 AM  Result Value Ref Range   WBC 5.4 4.0 - 10.5 K/uL   RBC 4.48 3.87 - 5.11 MIL/uL   Hemoglobin 12.2 12.0 - 15.0 g/dL   HCT 38.1 36.0 - 46.0 %   MCV 85.0 78.0 - 100.0 fL   MCH 27.2 26.0 - 34.0 pg   MCHC 32.0 30.0 - 36.0 g/dL   RDW 13.6 11.5 - 15.5 %   Platelets 269 150 - 400 K/uL     Gen NAD. Vital signs reivewed HEENT: Normocephalic, atraumatic Cardio: RRR and no JVD Resp: CTA B/L and Unlabored GI: BS positive and ND Musc/Skel:  No edema, no tenderness Neuro: Alert Motor: 4-/5 Left HF KE, 0/5 at ankle 0/5 in LUE to command, however, moving against gravity on arrival this AM. Spastic tone throughout Skin:   Intact. Warm and dry. Right knee without erythema, warmth  Assessment/Plan: 1. Functional deficits secondary to Right MCA  infarct which require 3+ hours per day of interdisciplinary therapy in a comprehensive inpatient rehab setting. Physiatrist is providing close team supervision and 24 hour management of active medical problems listed below. Physiatrist and rehab team continue to assess barriers to discharge/monitor patient progress toward functional and medical goals. FIM: Function - Bathing Bathing activity did not occur: Refused Position: Shower Body parts bathed by patient: Left arm, Chest, Abdomen, Front perineal area, Right upper leg, Left upper leg, Right lower leg, Left lower leg Body parts bathed by helper: Right arm, Buttocks, Back Assist Level: Touching or steadying assistance(Pt > 75%)  Function- Upper Body Dressing/Undressing What is the patient wearing?: Pull over shirt/dress, Bra Bra - Perfomed by patient: Thread/unthread left bra strap, Thread/unthread right bra strap Bra - Perfomed by helper: Hook/unhook bra (pull down sports bra) Pull over shirt/dress - Perfomed by patient: Thread/unthread right sleeve, Thread/unthread left sleeve, Put head through opening, Pull shirt over trunk Pull over shirt/dress - Perfomed by helper: Thread/unthread left sleeve, Pull shirt over trunk Assist Level: Touching or steadying assistance(Pt > 75%) Function - Lower Body Dressing/Undressing What is the patient wearing?: Underwear, Pants, Socks, Shoes, AFO  Position: Wheelchair/chair at sink Underwear - Performed by patient: Thread/unthread right underwear leg, Thread/unthread left underwear leg Underwear - Performed by helper: Pull underwear up/down Pants- Performed by patient: Thread/unthread right pants leg, Thread/unthread left pants leg Pants- Performed by helper: Pull pants up/down Non-skid slipper socks- Performed by helper: Don/doff right sock, Don/doff left sock Socks - Performed by patient: Don/doff right sock Socks - Performed by helper: Don/doff left sock Shoes - Performed by patient: Don/doff right  shoe, Fasten right Shoes - Performed by helper: Don/doff left shoe, Fasten left AFO - Performed by helper: Don/doff left AFO Assist for footwear: Partial/moderate assist Assist for lower body dressing: Touching or steadying assistance (Pt > 75%)  Function - Toileting Toileting activity did not occur: No continent bowel/bladder event Toileting steps completed by patient: Performs perineal hygiene Toileting steps completed by helper: Adjust clothing prior to toileting, Adjust clothing after toileting Toileting Assistive Devices: Grab bar or rail Assist level: Touching or steadying assistance (Pt.75%)  Function - Air cabin crew transfer assistive device: Bedside commode Assist level to toilet: Moderate assist (Pt 50 - 74%/lift or lower) Assist level from toilet: Moderate assist (Pt 50 - 74%/lift or lower) Assist level to bedside commode (at bedside): Moderate assist (Pt 50 - 74%/lift or lower) Assist level from bedside commode (at bedside): Moderate assist (Pt 50 - 74%/lift or lower)  Function - Chair/bed transfer Chair/bed transfer method: Squat pivot Chair/bed transfer assist level: Supervision or verbal cues Chair/bed transfer assistive device: Armrests Chair/bed transfer details: Verbal cues for safe use of DME/AE, Verbal cues for precautions/safety  Function - Locomotion: Wheelchair Will patient use wheelchair at discharge?:  (TBD) Type: Manual Max wheelchair distance: 150' Assist Level: Supervision or verbal cues Assist Level: Supervision or verbal cues Assist Level: Supervision or verbal cues Turns around,maneuvers to table,bed, and toilet,negotiates 3% grade,maneuvers on rugs and over doorsills: No Function - Locomotion: Ambulation Assistive device: No device Max distance: 200 Assist level: Moderate assist (Pt 50 - 74%) Assist level: Touching or steadying assistance (Pt > 75%) Walk 50 feet with 2 turns activity did not occur: Safety/medical concerns Assist level:  Moderate assist (Pt 50 - 74%) Walk 150 feet activity did not occur: Safety/medical concerns Assist level: Moderate assist (Pt 50 - 74%) Walk 10 feet on uneven surfaces activity did not occur: Safety/medical concerns  Function - Comprehension Comprehension: Auditory Comprehension assist level: Follows basic conversation/direction with no assist  Function - Expression Expression: Verbal Expression assist level: Expresses basic needs/ideas: With no assist  Function - Social Interaction Social Interaction assist level: Interacts appropriately with others with medication or extra time (anti-anxiety, antidepressant).  Function - Problem Solving Problem solving assist level: Solves basic 90% of the time/requires cueing < 10% of the time  Function - Memory Memory assist level: Recognizes or recalls 90% of the time/requires cueing < 10% of the time Patient normally able to recall (first 3 days only): Current season, That he or she is in a hospital, Staff names and faces, Location of own room   Medical Problem List and Plan: 1. 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 DVT Prophylaxis/Anticoagulation: Pharmaceutical: Lovenox -cont SCD off lovenox due to excessive bruising/pt request, may d/c weekly CBC now that pt off lovenox 3. Migraine Headaches/Pain Management: . On topamax 50 daily.   Sensory dysesthesia LLE improved  4. Mood: LCSW to follow for evaluation and support.  5. Neuropsych: This patient is not  fullycapable of making decisions on herown behalf. 6. Skin/Wound Care: routine pressure relief measures  7. Fluids/Electrolytes/Nutrition: Liquids advanced to thins Monitor I/O.  Low alb will supplement with prostat  BMP normal 8/15   Protein supplement changed due to pt preference 8. Dyslipidemia: On Lipitor. 9. Thyroid nodule: follow up on outpatient basis 10. Obesity: Educate on importance of weight loss  with appropriate diet to promote health and mobility.  11. Question antiphospholipid syndrome: high Beta 2-glycoprotein IgG. Will need recheck labs in 12 weeks. 12.  Spasticity LUE and LLE MAS 2 in finger flexors, no flexor withdrawal in LLE  resting hand splint , + clonus at ankle  Increase tizanidine 41m qhs  Consider botox if poor control 13.  Constipation: Improving   Cont miralax 15.  Urinary incont: Improving, CNA reports cloudy malodorous urine, no dysuria, monitor, enc fluids  Cont toileting program 16. Fall on 8/10- no injury  Assisted fall  Frequent checks  Cont to monitor  LOS (Days) 27 A FACE TO FACE EVALUATION WAS PERFORMED  , E 01/06/2017, 7:19 AM

## 2017-01-06 NOTE — Progress Notes (Signed)
Speech Language Pathology Daily Session Note  Patient Details  Name: Shannon Erickson MRN: [id number] Date of Birth: Mar 30, 1976  Today's Date: 01/06/2017 SLP Individual Time: 0725-0820 SLP Individual Time Calculation (min): 55 min  Short Term Goals: Week 4: SLP Short Term Goal 1 (Week 4): Pt will use external memory aids to recall new daily information with Mod I.  SLP Short Term Goal 2 (Week 4): Pt will complete semi-complex problem solving tasks with Supervision verbal cues.  SLP Short Term Goal 3 (Week 4): Pt will scan to left of her environment with Mod I to locate objects during functional tasks.  SLP Short Term Goal 4 (Week 4): Pt will demonstrate anticipatory awareness by listing 3 activities that are safe for her to participate in at discharge with Mod I.   Skilled Therapeutic Interventions: Skilled treatment session focused on cognitive goals. SLP facilitated session by providing intermittent verbal cues for problem solving during tray set-up. SLP also provided Min-Mod A verbal cues for problem solving and organization during a mildly complex deductive reasoning task. Patient left upright in bed with all needs within reach. Continue with current plan of care.      Function:  Eating Eating   Modified Consistency Diet: No Eating Assist Level: More than reasonable amount of time;Set up assist for   Eating Set Up Assist For: Opening containers;Cutting food       Cognition Comprehension Comprehension assist level: Follows basic conversation/direction with no assist  Expression   Expression assist level: Expresses basic needs/ideas: With no assist  Social Interaction Social Interaction assist level: Interacts appropriately with others with medication or extra time (anti-anxiety, antidepressant).  Problem Solving Problem solving assist level: Solves complex 90% of the time/cues < 10% of the time  Memory Memory assist level: Recognizes or recalls 90% of the time/requires cueing <  10% of the time    Pain Pain Assessment Pain Assessment: No/denies pain  Therapy/Group: Individual Therapy  Shannon Erickson 01/06/2017, 3:58 PM

## 2017-01-07 ENCOUNTER — Inpatient Hospital Stay (HOSPITAL_COMMUNITY): Payer: Self-pay | Admitting: Occupational Therapy

## 2017-01-07 NOTE — Progress Notes (Signed)
Subjective/Complaints:   Slept well. Wearing WHO. Denies pain  ROS: pt denies nausea, vomiting, diarrhea, cough, shortness of breath or chest pain   Objective: Vital Signs: Blood pressure 103/65, pulse 74, temperature 98.9 F (37.2 C), temperature source Oral, resp. rate 15, height 5\' 3"  (1.6 m), weight 88 kg (193 lb 15.6 oz), SpO2 100 %. No results found. No results found for this or any previous visit (from the past 72 hour(s)).   Gen NAD. Vital signs reivewed HEENT: Normocephalic, atraumatic Cardio: RRR without murmur. No JVD  Resp: CTA B/L and Unlabored GI: BS positive and ND Musc/Skel:  No edema, no tenderness Neuro: Alert Motor: 4-/5 Left HF KE, 0/5 at ankle 0/5 in LUE to command, however, moving against gravity on arrival this AM. Spastic tone throughout Skin:   Intact. Warm and dry. Right knee without erythema, warmth  Assessment/Plan: 1. Functional deficits secondary to Right MCA infarct which require 3+ hours per day of interdisciplinary therapy in a comprehensive inpatient rehab setting. Physiatrist is providing close team supervision and 24 hour management of active medical problems listed below. Physiatrist and rehab team continue to assess barriers to discharge/monitor patient progress toward functional and medical goals. FIM: Function - Bathing Bathing activity did not occur: Refused Position: Shower Body parts bathed by patient: Left arm, Chest, Abdomen, Front perineal area, Right upper leg, Left upper leg, Right lower leg, Left lower leg Body parts bathed by helper: Right arm, Buttocks, Back Assist Level: Touching or steadying assistance(Pt > 75%)  Function- Upper Body Dressing/Undressing What is the patient wearing?: Pull over shirt/dress, Bra Bra - Perfomed by patient: Thread/unthread left bra strap, Thread/unthread right bra strap Bra - Perfomed by helper: Hook/unhook bra (pull down sports bra) Pull over shirt/dress - Perfomed by patient:  Thread/unthread right sleeve, Thread/unthread left sleeve, Put head through opening, Pull shirt over trunk Pull over shirt/dress - Perfomed by helper: Thread/unthread left sleeve, Pull shirt over trunk Assist Level: Touching or steadying assistance(Pt > 75%) Function - Lower Body Dressing/Undressing What is the patient wearing?: Underwear, Pants, Socks, Shoes, AFO Position: Wheelchair/chair at sink Underwear - Performed by patient: Thread/unthread right underwear leg, Thread/unthread left underwear leg Underwear - Performed by helper: Pull underwear up/down Pants- Performed by patient: Thread/unthread right pants leg, Thread/unthread left pants leg Pants- Performed by helper: Pull pants up/down Non-skid slipper socks- Performed by helper: Don/doff right sock, Don/doff left sock Socks - Performed by patient: Don/doff right sock Socks - Performed by helper: Don/doff left sock Shoes - Performed by patient: Don/doff right shoe, Fasten right Shoes - Performed by helper: Don/doff left shoe, Fasten left AFO - Performed by helper: Don/doff left AFO Assist for footwear: Partial/moderate assist Assist for lower body dressing: Touching or steadying assistance (Pt > 75%)  Function - Toileting Toileting activity did not occur: No continent bowel/bladder event Toileting steps completed by patient: Performs perineal hygiene Toileting steps completed by helper: Adjust clothing prior to toileting, Adjust clothing after toileting Toileting Assistive Devices: Grab bar or rail Assist level: Touching or steadying assistance (Pt.75%)  Function - Archivist transfer assistive device: Walker, Elevated toilet seat/BSC over toilet Assist level to toilet: Moderate assist (Pt 50 - 74%/lift or lower) Assist level from toilet: Moderate assist (Pt 50 - 74%/lift or lower) Assist level to bedside commode (at bedside): Moderate assist (Pt 50 - 74%/lift or lower) Assist level from bedside commode (at  bedside): Moderate assist (Pt 50 - 74%/lift or lower)  Function - Chair/bed transfer Chair/bed transfer method:  Squat pivot Chair/bed transfer assist level: Supervision or verbal cues Chair/bed transfer assistive device: Armrests Chair/bed transfer details: Verbal cues for precautions/safety, Verbal cues for safe use of DME/AE  Function - Locomotion: Wheelchair Will patient use wheelchair at discharge?:  (TBD) Type: Manual Max wheelchair distance: 150' Assist Level: Supervision or verbal cues Assist Level: Supervision or verbal cues Assist Level: Supervision or verbal cues Turns around,maneuvers to table,bed, and toilet,negotiates 3% grade,maneuvers on rugs and over doorsills: No Function - Locomotion: Ambulation Assistive device: No device Max distance: 53ft Assist level: Touching or steadying assistance (Pt > 75%) Assist level: Touching or steadying assistance (Pt > 75%) Walk 50 feet with 2 turns activity did not occur: Safety/medical concerns Assist level: Moderate assist (Pt 50 - 74%) Walk 150 feet activity did not occur: Safety/medical concerns Assist level: Moderate assist (Pt 50 - 74%) Walk 10 feet on uneven surfaces activity did not occur: Safety/medical concerns  Function - Comprehension Comprehension: Auditory Comprehension assist level: Follows basic conversation/direction with no assist  Function - Expression Expression: Verbal Expression assist level: Expresses basic needs/ideas: With no assist  Function - Social Interaction Social Interaction assist level: Interacts appropriately with others with medication or extra time (anti-anxiety, antidepressant).  Function - Problem Solving Problem solving assist level: Solves basic 90% of the time/requires cueing < 10% of the time  Function - Memory Memory assist level: Recognizes or recalls 90% of the time/requires cueing < 10% of the time Patient normally able to recall (first 3 days only): Current season, That he or  she is in a hospital, Staff names and faces, Location of own room   Medical Problem List and Plan: 1. Left hemiparesis secondary to Right MCA infarct  ASA and Plavix  Cont CIR,PT, OT, SLP progressing with therapy    2. DVT Prophylaxis/Anticoagulation:  cont SCD off lovenox due to excessive bruising  3. Migraine Headaches/Pain Management: . On topamax 50 daily.   Sensory dysesthesia LLE improved  4. Mood: LCSW to follow for evaluation and support.  5. Neuropsych: This patient is not fullycapable of making decisions on herown behalf. 6. Skin/Wound Care: routine pressure relief measures  7. Fluids/Electrolytes/Nutrition: Liquids advanced to thins Monitor I/O.  Low alb will supplement with prostat  BMP normal 8/15   Protein supplement changed due to pt preference 8. Dyslipidemia: On Lipitor. 9. Thyroid nodule: follow up on outpatient basis 10. Obesity: Educate on importance of weight loss with appropriate diet to promote health and mobility.  11. Question antiphospholipid syndrome: high Beta 2-glycoprotein IgG. Will need recheck labs in 12 weeks. 12.  Spasticity LUE and LLE MAS 2 in finger flexors, no flexor withdrawal in LLE  resting hand splint , + clonus at ankle  Increased tizanidine 6mg  qhs on 8/17---observe  Consider botox if poor control 13.  Constipation: Improving   Cont miralax 15.  Urinary incont: Improving, CNA reports cloudy malodorous urine, no dysuria monitor, enc fluids  Cont toileting program 16. Fall on 8/10- no injury  Assisted fall  Frequent checks  Cont to monitor  LOS (Days) 28 A FACE TO FACE EVALUATION WAS PERFORMED  Shannon Erickson T 01/07/2017, 8:28 AM

## 2017-01-07 NOTE — Progress Notes (Signed)
Occupational Therapy Session Note  Patient Details  Name: Elenoa Spraggins MRN: [id number] Date of Birth: June 29, 1975  Today's Date: 01/07/2017 OT Individual Time: 0800-0900 OT Individual Time Calculation (min): 60 min    Skilled Therapeutic Interventions/Progress Updates:    Pt greeted supine in bed upon OT arrival. Pt transferred to toilet and voided bladder. Pt able to manage clothing with OT providing LLE stabilization and min guard A. Pt then stood for toothrushing task with while OT facilitated weight bearing through LLE. L UE NMR with focus on weight bearing and proprioceptive input using standing frame, towel placed under L elbow and forearm to reduce friction. Pt with improved proximal activation and able to push and pull marbles across midline. Pt returned to room at end of session and left seated in wc with breakfast set-up.   Therapy Documentation Precautions:  Precautions Precautions: Fall Precaution Comments: L hemiplegia Restrictions Weight Bearing Restrictions: No Pain:  none/denies pain  See Function Navigator for Current Functional Status.   Therapy/Group: Individual Therapy  Mal Amabile 01/07/2017, 8:48 AM

## 2017-01-08 ENCOUNTER — Inpatient Hospital Stay (HOSPITAL_COMMUNITY): Payer: Self-pay

## 2017-01-08 NOTE — Progress Notes (Signed)
Subjective/Complaints:   No problems overnight. Slept without issues  ROS: pt denies nausea, vomiting, diarrhea, cough, shortness of breath or chest pain   Objective: Vital Signs: Blood pressure 103/64, pulse 62, temperature 98.2 F (36.8 C), temperature source Oral, resp. rate 18, height 5\' 3"  (1.6 m), weight 88 kg (193 lb 15.6 oz), SpO2 99 %. No results found. No results found for this or any previous visit (from the past 72 hour(s)).   Gen NAD. Vital signs reivewed HEENT: Normocephalic, atraumatic Cardio: RRR without murmur. No JVD   Resp: CTA Bilaterally without wheezes or rales. Normal effort  GI: BS positive and ND Musc/Skel:  No edema, no tenderness Neuro: Alert Motor: 4-/5 Left HF KE, 0/5 at ankle 0/5 in LUE to command, however, moving against gravity on arrival this AM. Spastic tone throughout, 1-2/4 LUE flexor pattern Skin:   Intact. Warm and dry. Right knee without erythema, warmth  Assessment/Plan: 1. Functional deficits secondary to Right MCA infarct which require 3+ hours per day of interdisciplinary therapy in a comprehensive inpatient rehab setting. Physiatrist is providing close team supervision and 24 hour management of active medical problems listed below. Physiatrist and rehab team continue to assess barriers to discharge/monitor patient progress toward functional and medical goals. FIM: Function - Bathing Bathing activity did not occur: Refused Position: Shower Body parts bathed by patient: Left arm, Chest, Abdomen, Front perineal area, Right upper leg, Left upper leg, Right lower leg, Left lower leg Body parts bathed by helper: Right arm, Buttocks, Back Assist Level: Touching or steadying assistance(Pt > 75%)  Function- Upper Body Dressing/Undressing What is the patient wearing?: Pull over shirt/dress, Bra Bra - Perfomed by patient: Thread/unthread left bra strap, Thread/unthread right bra strap Bra - Perfomed by helper: Hook/unhook bra (pull down  sports bra) Pull over shirt/dress - Perfomed by patient: Thread/unthread right sleeve, Thread/unthread left sleeve, Put head through opening, Pull shirt over trunk Pull over shirt/dress - Perfomed by helper: Thread/unthread left sleeve, Pull shirt over trunk Assist Level: Touching or steadying assistance(Pt > 75%) Function - Lower Body Dressing/Undressing What is the patient wearing?: Underwear, Pants, Socks, Shoes, AFO Position: Wheelchair/chair at sink Underwear - Performed by patient: Thread/unthread right underwear leg, Thread/unthread left underwear leg Underwear - Performed by helper: Pull underwear up/down Pants- Performed by patient: Thread/unthread right pants leg, Thread/unthread left pants leg Pants- Performed by helper: Pull pants up/down Non-skid slipper socks- Performed by helper: Don/doff right sock, Don/doff left sock Socks - Performed by patient: Don/doff right sock Socks - Performed by helper: Don/doff left sock Shoes - Performed by patient: Don/doff right shoe, Fasten right Shoes - Performed by helper: Don/doff left shoe, Fasten left AFO - Performed by helper: Don/doff left AFO Assist for footwear: Partial/moderate assist Assist for lower body dressing: Touching or steadying assistance (Pt > 75%)  Function - Toileting Toileting activity did not occur: No continent bowel/bladder event Toileting steps completed by patient: Performs perineal hygiene Toileting steps completed by helper: Adjust clothing prior to toileting, Adjust clothing after toileting Toileting Assistive Devices: Grab bar or rail Assist level: Touching or steadying assistance (Pt.75%)  Function - Archivist transfer assistive device: Walker, Elevated toilet seat/BSC over toilet Assist level to toilet: Moderate assist (Pt 50 - 74%/lift or lower) Assist level from toilet: Moderate assist (Pt 50 - 74%/lift or lower) Assist level to bedside commode (at bedside): Moderate assist (Pt 50 -  74%/lift or lower) Assist level from bedside commode (at bedside): Moderate assist (Pt 50 - 74%/lift  or lower)  Function - Chair/bed transfer Chair/bed transfer method: Squat pivot Chair/bed transfer assist level: Supervision or verbal cues Chair/bed transfer assistive device: Armrests Chair/bed transfer details: Verbal cues for precautions/safety, Verbal cues for safe use of DME/AE  Function - Locomotion: Wheelchair Will patient use wheelchair at discharge?:  (TBD) Type: Manual Max wheelchair distance: 150' Assist Level: Supervision or verbal cues Assist Level: Supervision or verbal cues Assist Level: Supervision or verbal cues Turns around,maneuvers to table,bed, and toilet,negotiates 3% grade,maneuvers on rugs and over doorsills: No Function - Locomotion: Ambulation Assistive device: No device Max distance: 73ft Assist level: Touching or steadying assistance (Pt > 75%) Assist level: Touching or steadying assistance (Pt > 75%) Walk 50 feet with 2 turns activity did not occur: Safety/medical concerns Assist level: Moderate assist (Pt 50 - 74%) Walk 150 feet activity did not occur: Safety/medical concerns Assist level: Moderate assist (Pt 50 - 74%) Walk 10 feet on uneven surfaces activity did not occur: Safety/medical concerns  Function - Comprehension Comprehension: Auditory Comprehension assist level: Follows basic conversation/direction with no assist  Function - Expression Expression: Verbal Expression assist level: Expresses basic needs/ideas: With no assist  Function - Social Interaction Social Interaction assist level: Interacts appropriately with others with medication or extra time (anti-anxiety, antidepressant).  Function - Problem Solving Problem solving assist level: Solves basic 90% of the time/requires cueing < 10% of the time  Function - Memory Memory assist level: Recognizes or recalls 90% of the time/requires cueing < 10% of the time Patient normally able  to recall (first 3 days only): Current season, That he or she is in a hospital, Staff names and faces, Location of own room   Medical Problem List and Plan: 1. Left hemiparesis secondary to Right MCA infarct  ASA and Plavix  Cont CIR,PT, OT, SLP progressing with therapy    -left WHO at night  2. DVT Prophylaxis/Anticoagulation:  cont SCD off lovenox due to excessive bruising  3. Migraine Headaches/Pain Management: . On topamax 50 daily.   Sensory dysesthesia LLE improved  4. Mood: LCSW to follow for evaluation and support.  5. Neuropsych: This patient is not fullycapable of making decisions on herown behalf. 6. Skin/Wound Care: routine pressure relief measures  7. Fluids/Electrolytes/Nutrition: Liquids advanced to thins Monitor I/O.  Low alb will supplement with prostat  BMP normal 8/15   Protein supplement changed due to pt preference 8. Dyslipidemia: On Lipitor. 9. Thyroid nodule: follow up on outpatient basis 10. Obesity: Educate on importance of weight loss with appropriate diet to promote health and mobility.  11. Question antiphospholipid syndrome: high Beta 2-glycoprotein IgG. Will need recheck labs in 12 weeks. 12.  Spasticity LUE and LLE MAS 2 in finger flexors, no flexor withdrawal in LLE  resting hand splint , + clonus at ankle  Increased tizanidine 6mg  qhs on 8/17---modest improvement  Consider botox if poor control 13.  Constipation: Improving   Cont miralax 15.  Urinary incont: Improving, CNA reports cloudy malodorous urine, no dysuria monitor, enc fluids  Cont toileting program 16. Fall on 8/10- no injury  Assisted fall  Frequent checks  Cont to monitor  LOS (Days) 29 A FACE TO FACE EVALUATION WAS PERFORMED  Shannon Erickson 01/08/2017, 8:50 AM

## 2017-01-08 NOTE — Progress Notes (Signed)
Physical Therapy Session Note  Patient Details  Name: Shannon Erickson MRN: [id number] Date of Birth: 09/12/1975  Today's Date: 01/08/2017 PT Individual Time: 1445-1515 PT Individual Time Calculation (min): 30 min   Short Term Goals: Week 4:  PT Short Term Goal 1 (Week 4): Equal LTGs  Skilled Therapeutic Interventions/Progress Updates:    Pt sitting in w/c upon PT arrival, agreeable to therapy tx and denies pain. Pt transported to gym in w/c total A for energy conservation and for time. Pt transferred from w/c to mat with supervision, squat pivot, pt requiring verbal cues for set up and technique. Session focused on L LE awareness and NMR. Pt performed 1 x 5 sit to stands with min assist with emphasis on equal weightbearing through L LE as pt tends to stay shifted over R LE, verbal cues for foot placement as pt tends to bring R foot back. Pt performed 1 x 5 minisquats with emphasis on L LE weightbearing, facilitation for L lateral weightshift during squat. Pt transferred back to w/c squat pivot with supervision and verbal cues for set up. Pt given cues to use L LE (hip flexors) muscles to lift LE onto leg rest rather than defaulting to lifting L LE with R UE, emphasized the importance of L attention and use. Pt left seated in w/c with needs in reach.    Therapy Documentation Precautions:  Precautions Precautions: Fall Precaution Comments: L hemiplegia Restrictions Weight Bearing Restrictions: No   See Function Navigator for Current Functional Status.   Therapy/Group: Individual Therapy  Cresenciano Genre, PT, DPT 01/08/2017, 3:16 PM

## 2017-01-09 ENCOUNTER — Inpatient Hospital Stay (HOSPITAL_COMMUNITY): Payer: Self-pay

## 2017-01-09 ENCOUNTER — Inpatient Hospital Stay (HOSPITAL_COMMUNITY): Payer: Self-pay | Admitting: Physical Therapy

## 2017-01-09 ENCOUNTER — Inpatient Hospital Stay (HOSPITAL_COMMUNITY): Payer: Self-pay | Admitting: Occupational Therapy

## 2017-01-09 NOTE — Progress Notes (Signed)
Physical Therapy Session Note  Patient Details  Name: Shannon Erickson MRN: [id number] Date of Birth: 06/10/1975  Today's Date: 01/09/2017 PT Individual Time: 1100-1200 PT Individual Time Calculation (min): 60 min   Short Term Goals: Week 4:  PT Short Term Goal 1 (Week 4): Equal LTGs  Skilled Therapeutic Interventions/Progress Updates:    no c/o pain.  Session focus on NMR and gait training.    Nustep x12 minutes with 4 extremities at level 2 for activity tolerance, reciprocal stepping pattern retraining, and forced use.    Gait training without AD [phone] with min guard assist and min verbal cues for L weight shift.  Pt demos 2 anterior LOB which she corrects with L stepping strategy and no increase in assist from PT.    NMR for LUE in sitting with UE ranger, min tactile cues focus on elbow and shoulder flexion/extension.  NMR for sit<>stand x5 reps each with split stance (RLE leading), with even stance with ball squeeze, with hold in squat position, and coming to full standing.  Pt requires supervision>steady assist for all.    Squat/pivot back to w/c at end of session, pt returned to room and positioned with call bell in reach and needs met.   Therapy Documentation Precautions:  Precautions Precautions: Fall Precaution Comments: L hemiplegia Restrictions Weight Bearing Restrictions: No   See Function Navigator for Current Functional Status.   Therapy/Group: Individual Therapy  Earnest Conroy Penven-Crew 01/09/2017, 12:05 PM

## 2017-01-09 NOTE — Progress Notes (Signed)
Speech Language Pathology Daily Session Note  Patient Details  Name: Shannon Erickson MRN: [id number] Date of Birth: [birth year]  Today's Date: 01/09/2017 SLP Individual Time: 9622-2979 SLP Individual Time Calculation (min): 54 min  Short Term Goals: Week 4: SLP Short Term Goal 1 (Week 4): Pt will use external memory aids to recall new daily information with Mod I.  SLP Short Term Goal 2 (Week 4): Pt will complete semi-complex problem solving tasks with Supervision verbal cues.  SLP Short Term Goal 3 (Week 4): Pt will scan to left of her environment with Mod I to locate objects during functional tasks.  SLP Short Term Goal 4 (Week 4): Pt will demonstrate anticipatory awareness by listing 3 activities that are safe for her to participate in at discharge with Mod I.   Skilled Therapeutic Interventions: Skilled ST treatment focused on cognitive goals. SLP facilitated anticipatory awareness skills, pt demonstrated ability to list 3 activities that are unsafe to participate in at home due to current impairments and 3 activities she could participate in safely at home with MOD I. 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 SLP facilitated semi-complex to complex problem solving skills utilizing novel deductive reasoning tasks given Min-Mod questions and verbal cues. Pt did not ask for help when needed, suggesting continued deficit in insight/awareness. Pt was left in room with call bell within reach. Recommend to continue ST services.      Function:  Cognition Comprehension Comprehension assist level: Follows complex conversation/direction with extra time/assistive device  Expression   Expression assist level: Expresses complex ideas: With extra  time/assistive device  Social Interaction Social Interaction assist level: Interacts appropriately with others with medication or extra time (anti-anxiety, antidepressant).  Problem Solving Problem solving assist level: Solves complex 90% of the time/cues < 10% of the time  Memory Memory assist level: Recognizes or recalls 90% of the time/requires cueing < 10% of the time    Pain Pain Assessment Pain Assessment: No/denies pain  Therapy/Group: Individual Therapy  [name] 01/09/2017, 3:00 PM

## 2017-01-09 NOTE — Progress Notes (Signed)
Subjective/Complaints:   No issues overnite except poor sleep,no bowel or bladder issues that awakened pt, some LUE and LLE spasm but didn't remember what time  ROS: pt denies nausea, vomiting, diarrhea, cough, shortness of breath or chest pain   Objective: Vital Signs: Blood pressure (!) 93/49, pulse 91, temperature 98.6 F (37 C), temperature source Oral, resp. rate 18, height 5\' 3"  (1.6 m), weight 88 kg (193 lb 15.6 oz), SpO2 97 %. No results found. No results found for this or any previous visit (from the past 72 hour(s)).   Gen NAD. Vital signs reivewed HEENT: Normocephalic, atraumatic Cardio: RRR without murmur. No JVD   Resp: CTA Bilaterally without wheezes or rales. Normal effort  GI: BS positive and ND Musc/Skel:  No edema, no tenderness Neuro: Alert Motor: 4-/5 Left HF KE, 0/5 at ankle 0/5 in LUE to command, however, moving against gravity on arrival this AM. Spastic tone throughout, 1-2/4 LUE flexor pattern Skin:   Intact. Warm and dry. Right knee without erythema, warmth  Assessment/Plan: 1. Functional deficits secondary to Right MCA infarct which require 3+ hours per day of interdisciplinary therapy in a comprehensive inpatient rehab setting. Physiatrist is providing close team supervision and 24 hour management of active medical problems listed below. Physiatrist and rehab team continue to assess barriers to discharge/monitor patient progress toward functional and medical goals. FIM: Function - Bathing Bathing activity did not occur: Refused Position: Wheelchair/chair at sink Body parts bathed by patient: Right arm, Left arm, Chest, Abdomen, Front perineal area, Buttocks, Right upper leg, Left upper leg Body parts bathed by helper: Back Assist Level: Touching or steadying assistance(Pt > 75%)  Function- Upper Body Dressing/Undressing What is the patient wearing?: Pull over shirt/dress, Bra Bra - Perfomed by patient: Thread/unthread left bra strap,  Thread/unthread right bra strap Bra - Perfomed by helper: Hook/unhook bra (pull down sports bra) Pull over shirt/dress - Perfomed by patient: Thread/unthread right sleeve, Thread/unthread left sleeve, Put head through opening, Pull shirt over trunk Pull over shirt/dress - Perfomed by helper: Thread/unthread left sleeve, Pull shirt over trunk Assist Level: Supervision or verbal cues, More than reasonable time Function - Lower Body Dressing/Undressing What is the patient wearing?: Underwear, Pants, Socks, Shoes, AFO Position: Wheelchair/chair at [employer] - Performed by patient: Thread/unthread right underwear leg Underwear - Performed by helper: Thread/unthread left underwear leg, Pull underwear up/down Pants- Performed by patient: Thread/unthread right pants leg Pants- Performed by helper: Thread/unthread left pants leg, Pull pants up/down Non-skid slipper socks- Performed by helper: Don/doff right sock, Don/doff left sock Socks - Performed by patient: Don/doff right sock Socks - Performed by helper: Don/doff left sock Shoes - Performed by patient: Don/doff right shoe, Fasten right Shoes - Performed by helper: Don/doff left shoe, Fasten left AFO - Performed by helper: Don/doff left AFO Assist for footwear: Partial/moderate assist Assist for lower body dressing: Touching or steadying assistance (Pt > 75%)  Function - Toileting Toileting activity did not occur: No continent bowel/bladder event Toileting steps completed by patient: Performs perineal hygiene Toileting steps completed by helper: Adjust clothing prior to toileting, Adjust clothing after toileting Toileting Assistive Devices: Grab bar or rail Assist level: Touching or steadying assistance (Pt.75%)  Function - Archivist transfer assistive device: Bedside commode, Elevated toilet seat/BSC over toilet, Grab bar Assist level to toilet: Moderate assist (Pt 50 - 74%/lift or lower) Assist level from toilet:  Moderate assist (Pt 50 - 74%/lift or lower) Assist level to bedside commode (at bedside): Moderate assist (  Pt 50 - 74%/lift or lower) Assist level from bedside commode (at bedside): Moderate assist (Pt 50 - 74%/lift or lower)  Function - Chair/bed transfer Chair/bed transfer method: Squat pivot Chair/bed transfer assist level: Supervision or verbal cues Chair/bed transfer assistive device: Armrests Chair/bed transfer details: Verbal cues for precautions/safety, Verbal cues for safe use of DME/AE  Function - Locomotion: Wheelchair Will patient use wheelchair at discharge?:  (TBD) Type: Manual Max wheelchair distance: 150' Assist Level: Supervision or verbal cues Assist Level: Supervision or verbal cues Assist Level: Supervision or verbal cues Turns around,maneuvers to table,bed, and toilet,negotiates 3% grade,maneuvers on rugs and over doorsills: No Function - Locomotion: Ambulation Assistive device: No device Max distance: 61ft Assist level: Touching or steadying assistance (Pt > 75%) Assist level: Touching or steadying assistance (Pt > 75%) Walk 50 feet with 2 turns activity did not occur: Safety/medical concerns Assist level: Moderate assist (Pt 50 - 74%) Walk 150 feet activity did not occur: Safety/medical concerns Assist level: Moderate assist (Pt 50 - 74%) Walk 10 feet on uneven surfaces activity did not occur: Safety/medical concerns  Function - Comprehension Comprehension: Auditory Comprehension assist level: Follows basic conversation/direction with extra time/assistive device  Function - Expression Expression: Verbal Expression assist level: Expresses basic needs/ideas: With no assist  Function - Social Interaction Social Interaction assist level: Interacts appropriately with others with medication or extra time (anti-anxiety, antidepressant).  Function - Problem Solving Problem solving assist level: Solves basic 90% of the time/requires cueing < 10% of the  time  Function - Memory Memory assist level: Recognizes or recalls 90% of the time/requires cueing < 10% of the time Patient normally able to recall (first 3 days only): Current season, That he or she is in a hospital, Staff names and faces, Location of own room   Medical Problem List and Plan: 1. Left hemiparesis secondary to Right MCA infarct  ASA and Plavix  Cont CIR,PT, OT, SLP progressing with therapy    -left WHO at night  2. DVT Prophylaxis/Anticoagulation:  cont SCD off lovenox due to excessive bruising  3. Migraine Headaches/Pain Management: . On topamax 50 daily.   Sensory dysesthesia LLE improved  4. Mood: LCSW to follow for evaluation and support.  5. Neuropsych: This patient is not fullycapable of making decisions on herown behalf. 6. Skin/Wound Care: routine pressure relief measures  7. Fluids/Electrolytes/Nutrition: Liquids advanced to thins Monitor I/O.  Low alb will supplement with prostat  BMP normal 8/15   Protein supplement changed due to pt preference 8. Dyslipidemia: On Lipitor. 9. Thyroid nodule: follow up on outpatient basis 10. Obesity: Educate on importance of weight loss with appropriate diet to promote health and mobility.  11. Question antiphospholipid syndrome: high Beta 2-glycoprotein IgG. Will need recheck labs in 12 weeks. 12.  Spasticity LUE and LLE MAS 2 in finger flexors, no flexor withdrawal in LLE  resting hand splint , + clonus at ankle  Increased tizanidine 6mg  qhs on 8/17---modest improvement increase to 8mg   Consider botox if poor control 13.  Constipation: Improving   Cont miralax 15.  Urinary incont: Improving,  Cont toileting program 16. Fall on 8/10- no injury  Assisted fall  Frequent checks  Cont to monitor 17.  Insomnia- in part due to elevated tone LOS (Days) 30 A FACE TO FACE EVALUATION WAS PERFORMED  Shannon Erickson 01/09/2017, 6:51 AM

## 2017-01-09 NOTE — Progress Notes (Signed)
Occupational Therapy Session Note  Patient Details  Name: Shannon Erickson MRN: [id number] Date of Birth: Sep 22, 1975  Today's Date: 01/09/2017  Session 1 OT Individual Time: 0801-0900 OT Individual Time Calculation (min): 59 min   Session 2 OT Individual Time: 1445-1530 OT Individual Time Calculation (min): 45 min    Short Term Goals: Week 4:  OT Short Term Goal 1 (Week 4): Pt will don AFO with min A OT Short Term Goal 2 (Week 4): Pt's daughter will assist pt with shower transfers without cues OT Short Term Goal 3 (Week 4): Pt will maintain standing balance with 75% supervision during ADL task OT Short Term Goal 4 (Week 4): Pt will complete 2/3 toileting steps with supervision   Skilled Therapeutic Interventions/Progress Updates:  Session 1   OT treatment session focused on LB dressing and L UE NMR. Pt reported need for bathroom. Transferred bed >wc>toilet with supervision/min A. Facilitated weight bearing through LLE when standing to pull down pants with min guard A for standing balance. Pt voided bladder and completed peri-care without assistance. Addressed LB dressing techniques to don socks, shoes, and L AFO by propping on bed. Pt able to don/doff socks w/ set-up A, continues to require min A to don L AFO. Educated pt on using shoe horn with min A to position correctly. Pt then completed L NMR on [employer] arm bike. Pt needed ace wrap around L hand to maintain grasp on handle, then was able to push and pull with L UE while R UE acted as an assist.   Session 2 OT treatment session focused on functional ambulation, L NMR, and L NMES. Pt ambulated in room to bathroom with hemi-walker and light min A. Pt with 2 lateral LOB when turning to sit onto commode but corrected w/ minA. Pt voided bladder then ambulated to the sink in similar fashion to wash hands. L NMR w/ weight bearing towel pushes while NMES applied to wrist extensors. Pt returned to room at end of session and left seated in wc  with needs met.   Therapy Documentation Precautions:  Precautions Precautions: Fall Precaution Comments: L hemiplegia Restrictions Weight Bearing Restrictions: No Pain: Pain Assessment Pain Assessment: No/denies pain none/denies pain See Function Navigator for Current Functional Status.   Therapy/Group: Individual Therapy  Valma Cava 01/09/2017, 3:49 PM

## 2017-01-09 NOTE — Progress Notes (Signed)
Speech Language Pathology Weekly Progress and Session Note  Patient Details  Name: Shannon Erickson MRN: [id number] Date of Birth: 10-May-1976  Beginning of progress report period: January 02, 2017 End of progress report period: January 09, 2017  Today's Date: 01/09/2017 SLP Individual Time: 7782-4235 SLP Individual Time Calculation (min): 54 min  Short Term Goals: Week 4: SLP Short Term Goal 1 (Week 4): Pt will use external memory aids to recall new daily information with Mod I.  SLP Short Term Goal 1 - Progress (Week 4): Met SLP Short Term Goal 2 (Week 4): Pt will complete semi-complex problem solving tasks with Supervision verbal cues.  SLP Short Term Goal 2 - Progress (Week 4): Not met SLP Short Term Goal 3 (Week 4): Pt will scan to left of her environment with Mod I to locate objects during functional tasks.  SLP Short Term Goal 3 - Progress (Week 4): Met SLP Short Term Goal 4 (Week 4): Pt will demonstrate anticipatory awareness by listing 3 activities that are safe for her to participate in at discharge with Mod I.  SLP Short Term Goal 4 - Progress (Week 4): Met    New Short Term Goals: Week 5: SLP Short Term Goal 1 (Week 5): Pt will complete semi-complex problem solving tasks with Supervision verbal cues.  SLP Short Term Goal 2 (Week 5): Pt will monitor and self-correct errors in the moment during semi-complex tasks with Supervision cues.  Weekly Progress Updates: Pt demonstrated ability to met 3 out of 4 goals pertaining to recall, left attention and aniticipatory awareness skills at Mod I level. Pt continues to demonstrate deficits in semi-complex problem solving skills and new goal added for monitor and self-correcting errors during tasks. Patient and family education is ongoing and discharge education will be completed. Patient would benefit from continued skilled SLP intervention to maximize her cognitive function and overall functional independence prior to discharge.      Intensity: Minumum of 1-2 x/day, 30 to 90 minutes Frequency: 3 to 5 out of 7 days Duration/Length of Stay: 01/14/17 Treatment/Interventions: Cognitive remediation/compensation;Cueing hierarchy;Functional tasks;Patient/family education;Therapeutic Activities;Internal/external aids   Daily Session  Skilled Therapeutic Interventions: Skilled ST treatment focused on cognitive goals. SLP facilitated anticipatory awareness skills, pt demonstrated ability to list 3 activities that are unsafe to participate in at home due to current impairments and 3 activities she could participate in safely at home with MOD I. 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 SLP facilitated semi-complex to complex problem solving skills utilizing novel deductive reasoning tasks given Min-Mod questions and verbal cues. Pt did not ask for help when needed, suggesting continued deficit in insight/awareness. Pt was left in room with call bell within reach. Recommend to continue ST services.      Function:   Cognition Comprehension Comprehension assist level: Follows complex conversation/direction with extra time/assistive device  Expression   Expression assist level: Expresses complex ideas: With extra time/assistive device  Social Interaction Social Interaction assist level: Interacts appropriately with others with medication or extra time (anti-anxiety, antidepressant).  Problem Solving Problem solving assist level: Solves complex 90% of the time/cues < 10% of the time  Memory Memory assist level: Recognizes or recalls 90% of the time/requires cueing < 10% of the time   General    Pain Pain Assessment Pain Assessment: No/denies pain  Therapy/Group: Individual  Therapy  Leesa Leifheit  [facility] 01/09/2017, 3:32 PM

## 2017-01-10 ENCOUNTER — Inpatient Hospital Stay (HOSPITAL_COMMUNITY): Payer: Self-pay | Admitting: Physical Therapy

## 2017-01-10 ENCOUNTER — Inpatient Hospital Stay (HOSPITAL_COMMUNITY): Payer: Self-pay

## 2017-01-10 ENCOUNTER — Inpatient Hospital Stay (HOSPITAL_COMMUNITY): Payer: Self-pay | Admitting: Occupational Therapy

## 2017-01-10 NOTE — Progress Notes (Signed)
Speech Language Pathology Daily Session Note  Patient Details  Name: Shannon Erickson MRN: [id number] Date of Birth: May 16, 1976  Today's Date: 01/10/2017 SLP Individual Time: 1303-1400 SLP Individual Time Calculation (min): 57 min  Short Term Goals: Week 5: SLP Short Term Goal 1 (Week 5): Pt will complete semi-complex problem solving tasks with Supervision verbal cues.  SLP Short Term Goal 2 (Week 5): Pt will monitor and self-correct errors in the moment during semi-complex tasks with Supervision cues.  Skilled Therapeutic Interventions: Skilled ST treatment focused on cognitive goals. SLP facilitated semi-complex problem solving and error awareness/correction skills with calendar analysis, daily schedule anysis and deductive reasoning tasks with min-supervision question cues. SLP educated on current goals before discharge and strategies to monitor errors during a task. Pt was left in chair in room with call bell within reach, Recommend to continue ST services.      Function:  Cognition Comprehension Comprehension assist level: Follows complex conversation/direction with extra time/assistive device  Expression   Expression assist level: Expresses complex ideas: With extra time/assistive device  Social Interaction Social Interaction assist level: Interacts appropriately with others with medication or extra time (anti-anxiety, antidepressant).  Problem Solving Problem solving assist level: Solves complex 90% of the time/cues < 10% of the time  Memory Memory assist level: Recognizes or recalls 90% of the time/requires cueing < 10% of the time    Pain Pain Assessment Pain Assessment: No/denies pain  Therapy/Group: Individual Therapy  [name]  [facility] 01/10/2017, 4:11 PM

## 2017-01-10 NOTE — Progress Notes (Signed)
Social Work Patient ID: Shannon Erickson, female   DOB: 02/02/1976, 41 y.o.   MRN: [id number]  Met with pt and daughter who had questions for another family member-pt's father. He had wanted to sit down and meet Prior to pt's discharge. Discussed having him come in and go through therapies with pt so can see and ask the therapist his questions. Daughter to find out time on Friday he can be here, I will also meet with him While he is here to answer any questions remaining. Work on discharge for Sat.

## 2017-01-10 NOTE — Discharge Instructions (Signed)
Inpatient Rehab Discharge Instructions  Shannon Erickson Discharge date and time:    Activities/Precautions/ Functional Status: Activity: no lifting, driving, or strenuous exercise  till cleared by MD Diet: cardiac diet--limit carbs/sweets Wound Care: none needed   Functional status:  ___ No restrictions     ___ Walk up steps independently ___ 24/7 supervision/assistance   ___ Walk up steps with assistance ___ Intermittent supervision/assistance  ___ Bathe/dress independently ___ Walk with walker     ___ Bathe/dress with assistance ___ Walk Independently    ___ Shower independently ___ Walk with assistance    ___ Shower with assistance ___ No alcohol     ___ Return to work/school ________  Special Instructions: 1. Needs to follow up with primary care for evaluation of thyroid nodule. 2. Drink plenty of fluids. Take your time going from sitting to standing.     COMMUNITY REFERRALS UPON DISCHARGE:    Home Health:   PT, OT, SP  Agency:[facility]  Phone:[phone]   Date of last service:01/14/2017  Medical Equipment/Items Ordered:WHEELCHAIR, HEMI-WALKER, 3 IN 1 AND TUB BENCH  Agency/Supplier:[facility]   [phone] Other:DISABILITY AND MEDICAID APPLICATIONS [facility] QUESTIONS [phone] AND MEDICAID WORKER Britta Mccreedy [phone]  GENERAL COMMUNITY RESOURCES FOR PATIENT/FAMILY: Support Groups:[facility] SUPPORT GROUP EVERY SECOND Thursday @ 3:00-4:00 PM ON THE REHAB UNIT QUESTIONS CONTACT [facility] [phone]  STROKE/TIA DISCHARGE INSTRUCTIONS SMOKING Cigarette smoking nearly doubles your risk of having a stroke & is the single most alterable risk factor  If you smoke or have smoked in the last 12 months, you are advised to quit smoking for your health.  Most of the excess cardiovascular risk related to smoking disappears within a year of stopping.  Ask you doctor about anti-smoking medications  [place] Quit Line: 1-800-QUIT  NOW  Free Smoking Cessation Classes [phone]  CHOLESTEROL Know your levels; limit fat & cholesterol in your diet  Lipid Panel     Component Value Date/Time   CHOL 193 12/04/2016 0419   TRIG 70 12/04/2016 0419   HDL 41 12/04/2016 0419   CHOLHDL 4.7 12/04/2016 0419   VLDL 14 12/04/2016 0419   LDLCALC 138 (H) 12/04/2016 0419      Many patients benefit from treatment even if their cholesterol is at goal.  Goal: Total Cholesterol (CHOL) less than 160  Goal:  Triglycerides (TRIG) less than 150  Goal:  HDL greater than 40  Goal:  LDL (LDLCALC) less than 100   BLOOD PRESSURE American Stroke Association blood pressure target is less that 120/80 mm/Hg  Your discharge blood pressure is:  BP: 103/61  Monitor your blood pressure  Limit your salt and alcohol intake  Many individuals will require more than one medication for high blood pressure  DIABETES (A1c is a blood sugar average for last 3 months) Goal HGBA1c is under 7% (HBGA1c is blood sugar average for last 3 months)  Diabetes: Prediabetic     Lab Results  Component Value Date   HGBA1C 6.2 (H) 12/04/2016     Your HGBA1c can be lowered with medications, healthy diet, and exercise.  Check your blood sugar as directed by your physician  Call your physician if you experience unexplained or low blood sugars.  PHYSICAL ACTIVITY/REHABILITATION Goal is 30 minutes at least 4 days per week  Activity: No driving, Therapies: see above Return to work: to be decided on follow up.   Activity decreases your risk of heart attack and stroke and makes your heart stronger.  It helps  control your weight and blood pressure; helps you relax and can improve your mood.  Participate in a regular exercise program.  Talk with your doctor about the best form of exercise for you (dancing, walking, swimming, cycling).  DIET/WEIGHT Goal is to maintain a healthy weight  Your discharge diet is: Diet regular Room service appropriate? Yes; Fluid  consistency: Thin liquids Your height is:  Height: 5\' 3"  (160 cm) Your current weight is: Weight: 86.8 kg (191 lb 5.4 oz) Your Body Mass Index (BMI) is:  BMI (Calculated): 35  Following the type of diet specifically designed for you will help prevent another stroke.  Your goal weight is:  141 lbs  Your goal Body Mass Index (BMI) is 19-24.  Healthy food habits can help reduce 3 risk factors for stroke:  High cholesterol, hypertension, and excess weight.  RESOURCES Stroke/Support Group:  Call [phone]   STROKE EDUCATION PROVIDED/REVIEWED AND GIVEN TO PATIENT Stroke warning signs and symptoms How to activate emergency medical system (call 911). Medications prescribed at discharge. Need for follow-up after discharge. Personal risk factors for stroke. Pneumonia vaccine given:  Flu vaccine given:  My questions have been answered, the writing is legible, and I understand these instructions.  I will adhere to these goals & educational materials that have been provided to me after my discharge from the hospital.     My questions have been answered and I understand these instructions. I will adhere to these goals and the provided educational materials after my discharge from the hospital.  Patient/Caregiver Signature _______________________________ Date __________  Clinician Signature _______________________________________ Date __________  Please bring this form and your medication list with you to all your follow-up doctor's appointments.

## 2017-01-10 NOTE — Progress Notes (Signed)
Physical Therapy Session Note  Patient Details  Name: Shannon Erickson MRN: [id number] Date of Birth: [birth year]  Today's Date: 01/10/2017 PT Individual Time: 0900-1000 PT Individual Time Calculation (min): 60 min   Short Term Goals: Week 4:  PT Short Term Goal 1 (Week 4): Equal LTGs  Skilled Therapeutic Interventions/Progress Updates:    no c/o pain, session focus on LLE NMR and balance.    Transfers throughout session with supervision and verbal cues for w/c set up for squat/pivot.  Sit<>stand throughout session with close supervision and verbal cues for foot set up.   NMR in standing for forward/retro stepping with RLE, toe taps to 4" step with RLE, and alternating toe taps to 2" step all with min assist for balance.    PT applied kinesiotape to L hand for tone management, and performed extended PROM hamstring stretch for LE tone management.  Also discussed PROM stretching to hamstring prior to sleeping for tone management over night.    Gait training x65' with min assist without AD.  Discussed gait training further with hemiwalker in next session for increased independence.    Pt returned to room at end of session and positioned upright in w/c with call bell in reach and needs met.   Therapy Documentation Precautions:  Precautions Precautions: Fall Precaution Comments: L hemiplegia Restrictions Weight Bearing Restrictions: No   See Function Navigator for Current Functional Status.   Therapy/Group: Individual Therapy  Earnest Conroy Penven[place]Crew 01/10/2017, 12:13 PM

## 2017-01-10 NOTE — Progress Notes (Signed)
Occupational Therapy Session Note  Patient Details  Name: Shannon Erickson MRN: [id number] Date of Birth: 11/16/1975  Today's Date: 01/10/2017 OT Individual Time: 0102-7253 OT Individual Time Calculation (min): 85 min   Short Term Goals: Week 4:  OT Short Term Goal 1 (Week 4): Pt will don AFO with min A OT Short Term Goal 2 (Week 4): Pt's daughter will assist pt with shower transfers without cues OT Short Term Goal 3 (Week 4): Pt will maintain standing balance with 75% supervision during ADL task OT Short Term Goal 4 (Week 4): Pt will complete 2/3 toileting steps with supervision   Skilled Therapeutic Interventions/Progress Updates:   OT treatment session focused on pt/family education, tub shower transfers, functional ambulation, L NMR, and NMES. OT demonstrated tub bench transfer to pt and daughter. Pt ambulated with therapist to tub bench using hemi-walker and min A. Pt able to lift B LEs into  And out of tiub without assistance. Pt ambulated back to wc, then daughter assisted with ambulated and transfer with min cues for body position. Pt brought to therapy gym and brought into tall kneeling with min A.  B UE's propped onto bench with focus on weight bearing through L UE. Activity graded to bring pt into quadruped with support provided to L UE. Facilitated weight shifting with activity to L. OT then put L UE into UE ranger and applied NMES to triceps and forearm with focus on L UE extension. Pt ambulated 20 ft with min guard A, then returned to room in wc 2/2 time management.   Ratio 1:1 Rate 35 pps Waveform- Asymmetric Ramp 1.0 Pulse 300 Intensity- 17 Duration -   15  No adverse reactions after treatment and is skin intact.    Therapy Documentation Precautions:  Precautions Precautions: Fall Precaution Comments: L hemiplegia Restrictions Weight Bearing Restrictions: No  Pain: Pain Assessment Pain Assessment: No/denies pain  See Function Navigator for Current Functional  Status.   Therapy/Group: Individual Therapy  Mal Amabile 01/10/2017, 11:52 AM

## 2017-01-10 NOTE — Progress Notes (Signed)
Subjective/Complaints:   No issues overnite  ROS: pt denies nausea, vomiting, diarrhea, cough, shortness of breath or chest pain   Objective: Vital Signs: Blood pressure 102/68, pulse 98, temperature 97.9 F (36.6 C), temperature source Oral, resp. rate 18, height 5\' 3"  (1.6 m), weight 88 kg (193 lb 15.6 oz), SpO2 98 %. No results found. No results found for this or any previous visit (from the past 72 hour(s)).   Gen NAD. Vital signs reivewed HEENT: Normocephalic, atraumatic Cardio: RRR without murmur. No JVD   Resp: CTA Bilaterally without wheezes or rales. Normal effort  GI: BS positive and ND Musc/Skel:  No edema, no tenderness Neuro: Alert Motor: 4-/5 Left HF KE, 0/5 at ankle 0/5 in LUE to command, however, moving against gravity on arrival this AM. Spastic tone throughout, 1-2/4 LUE flexor pattern Skin:   Intact. Warm and dry. Right knee without erythema, warmth  Assessment/Plan: 1. Functional deficits secondary to Right MCA infarct which require 3+ hours per day of interdisciplinary therapy in a comprehensive inpatient rehab setting. Physiatrist is providing close team supervision and 24 hour management of active medical problems listed below. Physiatrist and rehab team continue to assess barriers to discharge/monitor patient progress toward functional and medical goals. FIM: Function - Bathing Bathing activity did not occur: Refused Position: Wheelchair/chair at sink Body parts bathed by patient: Right arm, Left arm, Chest, Abdomen, Front perineal area, Buttocks, Right upper leg, Left upper leg Body parts bathed by helper: Back Assist Level: Touching or steadying assistance(Pt > 75%)  Function- Upper Body Dressing/Undressing What is the patient wearing?: Pull over shirt/dress, Bra Bra - Perfomed by patient: Thread/unthread left bra strap, Thread/unthread right bra strap Bra - Perfomed by helper: Hook/unhook bra (pull down sports bra) Pull over shirt/dress -  Perfomed by patient: Thread/unthread right sleeve, Thread/unthread left sleeve, Put head through opening, Pull shirt over trunk Pull over shirt/dress - Perfomed by helper: Thread/unthread left sleeve, Pull shirt over trunk Assist Level: Supervision or verbal cues, More than reasonable time Function - Lower Body Dressing/Undressing What is the patient wearing?: Underwear, Pants, Socks, Shoes, AFO Position: Wheelchair/chair at [employer] - Performed by patient: Thread/unthread right underwear leg Underwear - Performed by helper: Thread/unthread left underwear leg, Pull underwear up/down Pants- Performed by patient: Thread/unthread right pants leg Pants- Performed by helper: Thread/unthread left pants leg, Pull pants up/down Non-skid slipper socks- Performed by helper: Don/doff right sock, Don/doff left sock Socks - Performed by patient: Don/doff right sock Socks - Performed by helper: Don/doff left sock Shoes - Performed by patient: Don/doff right shoe, Fasten right Shoes - Performed by helper: Don/doff left shoe, Fasten left AFO - Performed by helper: Don/doff left AFO Assist for footwear: Partial/moderate assist Assist for lower body dressing: Touching or steadying assistance (Pt > 75%)  Function - Toileting Toileting activity did not occur: No continent bowel/bladder event Toileting steps completed by patient: Performs perineal hygiene Toileting steps completed by helper: Adjust clothing prior to toileting, Adjust clothing after toileting Toileting Assistive Devices: Grab bar or rail Assist level: Touching or steadying assistance (Pt.75%)  Function - Archivist transfer assistive device: Elevated toilet seat/BSC over toilet, Grab bar Assist level to toilet: Moderate assist (Pt 50 - 74%/lift or lower) Assist level from toilet: Moderate assist (Pt 50 - 74%/lift or lower) Assist level to bedside commode (at bedside): Moderate assist (Pt 50 - 74%/lift or lower) Assist  level from bedside commode (at bedside): Moderate assist (Pt 50 - 74%/lift or lower)  Function -  Chair/bed transfer Chair/bed transfer method: Squat pivot Chair/bed transfer assist level: Touching or steadying assistance (Pt > 75%) Chair/bed transfer assistive device: Armrests Chair/bed transfer details: Verbal cues for precautions/safety, Verbal cues for safe use of DME/AE  Function - Locomotion: Wheelchair Will patient use wheelchair at discharge?:  (TBD) Type: Manual Max wheelchair distance: 150' Assist Level: Supervision or verbal cues Assist Level: Supervision or verbal cues Assist Level: Supervision or verbal cues Turns around,maneuvers to table,bed, and toilet,negotiates 3% grade,maneuvers on rugs and over doorsills: No Function - Locomotion: Ambulation Assistive device: No device Max distance: 57 Assist level: Touching or steadying assistance (Pt > 75%) Assist level: Touching or steadying assistance (Pt > 75%) Walk 50 feet with 2 turns activity did not occur: Safety/medical concerns Assist level: Touching or steadying assistance (Pt > 75%) Walk 150 feet activity did not occur: Safety/medical concerns Assist level: Moderate assist (Pt 50 - 74%) Walk 10 feet on uneven surfaces activity did not occur: Safety/medical concerns  Function - Comprehension Comprehension: Auditory Comprehension assist level: Follows complex conversation/direction with extra time/assistive device  Function - Expression Expression: Verbal Expression assist level: Expresses complex ideas: With extra time/assistive device  Function - Social Interaction Social Interaction assist level: Interacts appropriately with others with medication or extra time (anti-anxiety, antidepressant).  Function - Problem Solving Problem solving assist level: Solves complex 90% of the time/cues < 10% of the time  Function - Memory Memory assist level: Recognizes or recalls 90% of the time/requires cueing < 10% of the  time Patient normally able to recall (first 3 days only): Current season, That he or she is in a hospital, Staff names and faces, Location of own room   Medical Problem List and Plan: 1. Left hemiparesis secondary to Right MCA infarct  ASA and Plavix  Cont CIR,PT, OT, SLP progressing with therapy    -left WHO at night  2. DVT Prophylaxis/Anticoagulation:  cont SCD off lovenox due to excessive bruising  3. Migraine Headaches/Pain Management: . On topamax 50 daily.   Sensory dysesthesia LLE improved  4. Mood: LCSW to follow for evaluation and support.  5. Neuropsych: This patient is not fullycapable of making decisions on herown behalf. 6. Skin/Wound Care: routine pressure relief measures  7. Fluids/Electrolytes/Nutrition: Liquids advanced to thins Monitor I/O.  Low alb will supplement with prostat  BMP normal 8/15   Protein supplement changed due to pt preference 8. Dyslipidemia: On Lipitor. 9. Thyroid nodule: follow up on outpatient basis 10. Obesity: Educate on importance of weight loss with appropriate diet to promote health and mobility.  11. Question antiphospholipid syndrome: high Beta 2-glycoprotein IgG. Will need recheck labs in 12 weeks. 12.  Spasticity LUE and LLE MAS 2 in finger flexors, no flexor withdrawal in LLE  resting hand splint , + clonus at ankle  Increased tizanidine 6mg  qhs on 8/17---modest improvement increase to 8mg   Consider botox if poor control 13.  Constipation: Improving   Cont miralax 15.  Urinary incont: Improving,  Cont toileting program 16. Fall on 8/10- no injury  Assisted fall  Frequent checks  Cont to monitor 17.  Insomnia- in part due to elevated tone LOS (Days) 31 A FACE TO FACE EVALUATION WAS PERFORMED  Shannon Erickson E 01/10/2017, 7:31 AM

## 2017-01-11 ENCOUNTER — Inpatient Hospital Stay (HOSPITAL_COMMUNITY): Payer: Medicaid Other | Admitting: Speech Pathology

## 2017-01-11 ENCOUNTER — Inpatient Hospital Stay (HOSPITAL_COMMUNITY): Payer: Self-pay | Admitting: Physical Therapy

## 2017-01-11 ENCOUNTER — Inpatient Hospital Stay (HOSPITAL_COMMUNITY): Payer: Self-pay | Admitting: Occupational Therapy

## 2017-01-11 LAB — BASIC METABOLIC PANEL
ANION GAP: 10 (ref 5–15)
BUN: 10 mg/dL (ref 6–20)
CALCIUM: 8.1 mg/dL — AB (ref 8.9–10.3)
CO2: 18 mmol/L — ABNORMAL LOW (ref 22–32)
CREATININE: 0.75 mg/dL (ref 0.44–1.00)
Chloride: 112 mmol/L — ABNORMAL HIGH (ref 101–111)
GFR calc Af Amer: >60 mL/min (ref >60)
Glucose, Bld: 115 mg/dL — ABNORMAL HIGH (ref 65–99)
Potassium: 4.1 mmol/L (ref 3.5–5.1)
Sodium: 140 mmol/L (ref 135–145)

## 2017-01-11 NOTE — Progress Notes (Signed)
Occupational Therapy Weekly Progress Note  Patient Details  Name: Shannon Erickson MRN: [id number] Date of Birth: 06-04-1975  Beginning of progress report period: December 11, 2016 End of progress report period: January 11, 2017  Today's Date: 01/11/2017 OT Individual Time: 0800-0900 OT Individual Time Calculation (min): 60 min    Patient has met 4 of 4 short term goals. Pt is ambulating room distance to get to bathroom and shower with min A. L UE function continues to improve with pt being able to utilize L UE as a stabilizer, with some increased muscle activation noted with NMES and NMR treatments. Pt is making great progress with her OT goals and is on target for dc at the end   Patient continues to demonstrate the following deficits: muscle weakness, abnormal tone and unbalanced muscle activation and decreased standing balance, hemiplegia and decreased balance strategies and therefore will continue to benefit from skilled OT intervention to enhance overall performance with BADL.  Patient progressing toward long term goals..  Continue plan of care.  OT Short Term Goals Week 4:  OT Short Term Goal 1 (Week 4): Pt will don AFO with min A OT Short Term Goal 1 - Progress (Week 4): Met OT Short Term Goal 2 (Week 4): Pt's daughter will assist pt with shower transfers without cues OT Short Term Goal 2 - Progress (Week 4): Met OT Short Term Goal 3 (Week 4): Pt will maintain standing balance with 75% supervision during ADL task OT Short Term Goal 3 - Progress (Week 4): Met OT Short Term Goal 4 (Week 4): Pt will complete 2/3 toileting steps with supervision  OT Short Term Goal 4 - Progress (Week 4): Met Week 5:  OT Short Term Goal 1 (Week 5): LTG = STG 2/2 ELOS  Skilled Therapeutic Interventions/Progress Updates:    OT treatment session focused on L NMR, NMES, dc planning, and LB dressing. Discussed dc plan and DME needs with pt. She will need a tub transfer bench and 3-in-1 BSC for home. Socks  donned in supine using modified technique, then pt donned shoes and L AFO seated EOB using chair prop method with min A to secure AFO. Standing balance/endurance in standing frame during L NMR and NMES. 1:1 NMES placed on supraspinatus, deltoid, triceps, and extensors. Facilitated normal movement patterns with functional activity of stacking cups. Pt needed assistance to open hand, then able to maintain grasp on cup 2/2 tone. Improved activation of L UE. Pt returned to room at end of session and left seated in wc with needs met.  Therapy Documentation Precautions:  Precautions Precautions: Fall Precaution Comments: L hemiplegia Restrictions Weight Bearing Restrictions: No General:   Vital Signs: Therapy Vitals Temp: 98.2 F (36.8 C) Temp Source: Oral Pulse Rate: 77 Resp: 20 BP: (!) 87/37 Patient Position (if appropriate): Lying Oxygen Therapy SpO2: 100 % O2 Device: Not Delivered Pain:  none/denies pain See Function Navigator for Current Functional Status.   Therapy/Group: Individual Therapy  Valma Cava 01/11/2017, 8:56 AM

## 2017-01-11 NOTE — Patient Care Conference (Signed)
Inpatient RehabilitationTeam Conference and Plan of Care Update Date: 01/11/2017   Time: 11:15 Am    Patient Name: Shannon Erickson      Medical Record Number: [id number]  Date of Birth: 09-Jul-1975 Sex: Female         Room/Bed: 4M04C/4M04C-01 Payor Info: Payor: MEDICAID POTENTIAL / Plan: MEDICAID POTENTIAL / Product Type: *No Product type* /    Admitting Diagnosis: Right CVA  Admit Date/Time:  12/10/2016  4:39 PM Admission Comments: No comment available   Primary Diagnosis:  Hemiparesis affecting left side as late effect of cerebrovascular accident [facility]) Principal Problem: Hemiparesis affecting left side as late effect of cerebrovascular accident [facility])  Patient Active Problem List   Diagnosis Date Noted  . Absence of bladder continence   . Fall against object   . Spastic hemiparesis (HCC)   . Acute rhinitis   . Slow transit constipation   . 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 Gait disturbance, post-stroke 12/10/2016  . Hemiparesis affecting left side as late effect of cerebrovascular accident (HCC)   . Left-sided visual neglect   . Antiphospholipid syndrome (HCC)   . Pain   . Prediabetes   . Dysphagia, post-stroke   . Carotid atherosclerosis, bilateral   . Thyroid nodule   . Hyperlipidemia   . Chronic migraine without aura without status migrainosus, not intractable   . Headache 12/03/2016  . Hypokalemia 12/03/2016  . 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 Left-sided weakness     Expected Discharge Date: Expected Discharge Date: 01/14/17  Team Members Present: Physician leading conference: Dr. Claudette Laws Nurse Present: Other (comment) Jacki Cones Dunnigan-RN) PT Present: Teodoro Kil, PT OT Present: Johnsie Cancel, OT SLP Present: Feliberto Gottron, SLP PPS Coordinator present : Tora Duck, RN, CRRN     Current Status/Progress Goal Weekly Team Focus  Medical   Now continent, spasticity improving  set up with medical follow  up and rehab follow up  discharge planning   Bowel/Bladder   (P) continent of bowel & bladder, LBM 01/09/17  (P) remain continent  (P) continue to monitor & assist as needed   Swallow/Nutrition/ Hydration             ADL's   (P) Min A/supervision   (P) Supervision/mod I      Mobility   supervision bed mobility and transfers, min guard ambulation with hemiwalker x70'  supervision bed mobility, min A ambulation and stairs, supervision w/c  NMR, balance, transfers, gait, family ed   Communication             Safety/Cognition/ Behavioral Observations  Supervision  Supervision  Family Edu   Pain   No c/o pain, has Tylenol, tramadol prn, family does not want tramadol given, has scheduled zanaflex & topamax  pain scale <3  continue to assess & treat as needed   Skin   some bruising/ fading  no new areas of break down  assess q shift      *See Care Plan and progress notes for long and short-term goals.     Barriers to Discharge  Current Status/Progress Possible Resolutions Date Resolved   Physician    Inaccessible home environment  moving in with daughter  no change in d/c date  caregiver ed      Nursing                  PT  OT                  SLP                SW                Discharge Planning/Teaching Needs:  Family education being completed with daughter, pt's father to come in Friday to see also and have his questions answered. Daughter to provide 24 hr care  family education on-going   Team Discussion:  Progressing toward her goals of supervision-min assist level. Daughter has been here doing hands on care and learning to ambulate with Mom.  Less tone in her arm, not waking her up at night. Dad to come in Friday for education with PT  Revisions to Treatment Plan:  DC 8/25    Continued Need for Acute Rehabilitation Level of Care: The patient requires daily medical management by a physician with specialized training in physical medicine and  rehabilitation for the following conditions: Daily direction of a multidisciplinary physical rehabilitation program to ensure safe treatment while eliciting the highest outcome that is of practical value to the patient.: Yes Daily medical management of patient stability for increased activity during participation in an intensive rehabilitation regime.: Yes Daily analysis of laboratory values and/or radiology reports with any subsequent need for medication adjustment of medical intervention for : Neurological problems  Njeri Vicente, Lemar Livings 01/11/2017, 2:37 PM

## 2017-01-11 NOTE — Progress Notes (Signed)
Physical Therapy Session Note  Patient Details  Name: Shannon Erickson MRN: [id number] Date of Birth: 28-Jul-1975  Today's Date: 01/11/2017 PT Individual Time: 1000-1100 PT Individual Time Calculation (min): 60 min   Short Term Goals: Week 4:  PT Short Term Goal 1 (Week 4): Equal LTGs  Skilled Therapeutic Interventions/Progress Updates:    no c/o pain.  Session focus on hands on family education for gait training, car transfers, and NMR for sit<>stands and LLE strengthening.   Gait training 2x70' with hemiwalker and close supervision>min guard for safety.  Pt's daughter completed hands on training for second gait trial after seated rest break.  Stair negotiation x2 steps (3") with no handrail to simulate home entry with mod assist.    Car transfer at simulated SUV height with min assist to lift LLE into car.    Gait training up/down ramp and on compliant surfaces with hemiwalker and min assist.    NMR/strengthening for LLE with sit<>stands with RLE in front with little success.  Returned to room at end of session in w/c, call bell in reach and needs met.   Therapy Documentation Precautions:  Precautions Precautions: Fall Precaution Comments: L hemiplegia Restrictions Weight Bearing Restrictions: No   See Function Navigator for Current Functional Status.   Therapy/Group: Individual Therapy  Michel Santee 01/11/2017, 11:45 AM

## 2017-01-11 NOTE — Progress Notes (Signed)
Social Work Patient ID: Shannon Erickson, female   DOB: 05/17/1976, 41 y.o.   MRN: [id number]  Met with pt and daughter to inform of team conference and her progress toward her goals. Working toward Discharge 8/25. Both daughter and pt feel she is doing well and daughter has been trained to ambulate with Mom with her hemi-walker. Pt's Dad to come in Friday @ 10:00 to see her in PT. Work toward discharge Sat.

## 2017-01-11 NOTE — Progress Notes (Signed)
Physical Therapy Session Note  Patient Details  Name: Shannon Erickson MRN: [id number] Date of Birth: August 02, 1975  Today's Date: 01/11/2017 PT Individual Time: 7939-0300 PT Individual Time Calculation (min): 28 min   Short Term Goals: Week 4:  PT Short Term Goal 1 (Week 4): Equal LTGs  Skilled Therapeutic Interventions/Progress Updates:    no c/o pain.  Session focus on standing balance and equal weight shift during Wii bowling game.  Pt transitions sit<>stand with min guard without AD, tolerates standing x15 minutes with min guard and facilitation of L weight shift during Wii game.  PT provided PROM stretching 3x30 seconds to L hamstring for tone management.  Pt returned to room in w/c at end of session, positioned with call bell in reach and needs met.   Therapy Documentation Precautions:  Precautions Precautions: Fall Precaution Comments: L hemiplegia Restrictions Weight Bearing Restrictions: No   See Function Navigator for Current Functional Status.   Therapy/Group: Individual Therapy  Michel Santee 01/11/2017, 3:13 PM

## 2017-01-11 NOTE — Progress Notes (Addendum)
Occupational Therapy Discharge Summary  Patient Details  Name: Shannon Erickson MRN: [id number] Date of Birth: 06-May-1976  Today's Date: 01/13/2017 OT Individual Time: 1000-1057 57 min  Skilled treatment interventions: 1;1. No c/o pain. Pt reporting already bathed with daughter who helped with washing hair and set up assistance for bathing body parts. Pt completes simulated LB dressing threading BLE into pants and advancing pants past hips with paper scrobs with supervision. Daughter asked how to fastens shoes with shoe buttons and OT demonstrated. Pt  Completes toileting with supervision for all steps with 1 LOB to R that pt was able to correct. Pt completes ambulatory tub shower transfer with supervision VC for safety and to not use brace for LE management to simulate home shower. Pt stands at high low table weight bearing through forearm for 10 min without rest break while playing "banana grams" in noisy environment to  Improve attention, cognition and problem solving iwht min VC for word finding. Educated family on use of doorbell as cheap alert system for pt to utilize if she needs help. Exited session with pt seated in w/c with family present and call light in reach.    Patient has met 12 of 13 long term goals due to improved activity tolerance, improved balance, postural control, ability to compensate for deficits, functional use of  LEFT upper and LEFT lower extremity, improved attention and improved awareness.  Patient to discharge at overall Supervision level.  Patient's care partner is independent to provide the necessary physical and cognitive assistance at discharge.    Reasons goals not met: Pt at a gross assist level for functional use of LUE and did not meet diminished level 2/2 decreased muscle activation in LUE  Recommendation:  Patient will benefit from ongoing skilled OT services in home health setting to continue to advance functional skills in the area of BADL and  iADL.  Equipment: tub transfer bench, 3-in-1 BSC, hemi-walker, wheelchair  Reasons for discharge: treatment goals met and discharge from hospital  Patient/family agrees with progress made and goals achieved: Yes  OT Discharge Precautions/Restrictions  Precautions Precautions: Fall Restrictions Weight Bearing Restrictions: No General   Vital Signs Therapy Vitals Temp: 98.4 F (36.9 C) Temp Source: Oral Pulse Rate: (!) 102 Resp: 18 BP: 120/67 Patient Position (if appropriate): Sitting Oxygen Therapy SpO2: 99 % O2 Device: Not Delivered Pain Pain Assessment Pain Assessment: No/denies pain ADL   Vision Baseline Vision/History: No visual deficits Patient Visual Report: No change from baseline Vision Assessment?: No apparent visual deficits Eye Alignment: Within Functional Limits Ocular Range of Motion: Within Functional Limits Tracking/Visual Pursuits: Able to track stimulus in all quads without difficulty Perception    Praxis Praxis: Intact Cognition Overall Cognitive Status: Impaired/Different from baseline Arousal/Alertness: Awake/alert Orientation Level: Oriented X4 Attention: Selective Sustained Attention: Appears intact Selective Attention: Appears intact Memory: Impaired Memory Impairment: Decreased recall of new information Sensation Sensation Light Touch: Appears Intact Coordination Gross Motor Movements are Fluid and Coordinated: No Fine Motor Movements are Fluid and Coordinated: No Finger Nose Finger Test: unable to perform d/t flaccidity Motor  Motor Motor: Hemiplegia;Abnormal tone Mobility  Bed Mobility Sit to Supine: 5: Supervision;HOB flat Transfers Sit to Stand: 5: Supervision Stand to Sit: 5: Supervision  Trunk/Postural Assessment  Cervical Assessment Cervical Assessment: Within Functional Limits Thoracic Assessment Thoracic Assessment: Within Functional Limits Lumbar Assessment Lumbar Assessment: Within Functional  Limits Postural Control Postural Control: Deficits on evaluation (absent righting protective responses 2/2 hemiplegia on L)  Balance Balance Balance Assessed: Yes Static  Sitting Balance Static Sitting - Balance Support: Feet supported Static Sitting - Level of Assistance: 5: Stand by assistance Dynamic Sitting Balance Dynamic Sitting - Balance Support: Feet supported Dynamic Sitting - Level of Assistance: 5: Stand by assistance Static Standing Balance Static Standing - Balance Support: Right upper extremity supported Static Standing - Level of Assistance: 5: Stand by assistance Dynamic Standing Balance Dynamic Standing - Balance Support: Right upper extremity supported Dynamic Standing - Level of Assistance: 4: Min assist Extremity/Trunk Assessment RUE Assessment RUE Assessment: Within Functional Limits LUE Assessment LUE Assessment: Exceptions to [facility] (full PROM with tightness at end ranges)   See Function Navigator for Current Functional Status.  [name] Doe 01/11/2017, 4:01 PM

## 2017-01-11 NOTE — Progress Notes (Signed)
Subjective/Complaints:   No issues slept well  ROS: pt denies nausea, vomiting, diarrhea, cough, shortness of breath or chest pain   Objective: Vital Signs: Blood pressure (!) 87/37, pulse 77, temperature 98.2 F (36.8 C), temperature source Oral, resp. rate 20, height 5' 3"  (1.6 m), weight 88 kg (193 lb 15.6 oz), SpO2 100 %. No results found. Results for orders placed or performed during the hospital encounter of 12/10/16 (from the past 72 hour(s))  Basic metabolic panel     Status: Abnormal   Collection Time: 01/11/17  5:01 AM  Result Value Ref Range   Sodium 140 135 - 145 mmol/L   Potassium 4.1 3.5 - 5.1 mmol/L   Chloride 112 (H) 101 - 111 mmol/L   CO2 18 (L) 22 - 32 mmol/L   Glucose, Bld 115 (H) 65 - 99 mg/dL   BUN 10 6 - 20 mg/dL   Creatinine, Ser 0.75 0.44 - 1.00 mg/dL   Calcium 8.1 (L) 8.9 - 10.3 mg/dL   GFR calc non Af Amer >60 >60 mL/min   GFR calc Af Amer >60 >60 mL/min    Comment: (NOTE) The eGFR has been calculated using the CKD EPI equation. This calculation has not been validated in all clinical situations. eGFR's persistently <60 mL/min signify possible Chronic Kidney Disease.    Anion gap 10 5 - 15     Gen NAD. Vital signs reivewed HEENT: Normocephalic, atraumatic Cardio: RRR without murmur. No JVD   Resp: CTA Bilaterally without wheezes or rales. Normal effort  GI: BS positive and ND Musc/Skel:  No edema, no tenderness Neuro: Alert Motor: 4-/5 Left HF KE, 0/5 at ankle 0/5 in LUE to command, however, moving against gravity on arrival this AM. Spastic tone throughout, 1-2/4 LUE flexor pattern Skin:   Intact. Warm and dry. Right knee without erythema, warmth  Assessment/Plan: 1. Functional deficits secondary to Right MCA infarct which require 3+ hours per day of interdisciplinary therapy in a comprehensive inpatient rehab setting. Physiatrist is providing close team supervision and 24 hour management of active medical problems listed  below. Physiatrist and rehab team continue to assess barriers to discharge/monitor patient progress toward functional and medical goals. FIM: Function - Bathing Bathing activity did not occur: Refused Position: Wheelchair/chair at sink Body parts bathed by patient: Right arm, Left arm, Chest, Abdomen, Front perineal area, Buttocks, Right upper leg, Left upper leg Body parts bathed by helper: Back Assist Level: Touching or steadying assistance(Pt > 75%)  Function- Upper Body Dressing/Undressing What is the patient wearing?: Pull over shirt/dress, Bra Bra - Perfomed by patient: Thread/unthread left bra strap, Thread/unthread right bra strap Bra - Perfomed by helper: Hook/unhook bra (pull down sports bra) Pull over shirt/dress - Perfomed by patient: Thread/unthread right sleeve, Thread/unthread left sleeve, Put head through opening, Pull shirt over trunk Pull over shirt/dress - Perfomed by helper: Thread/unthread left sleeve, Pull shirt over trunk Assist Level: Supervision or verbal cues, More than reasonable time Function - Lower Body Dressing/Undressing What is the patient wearing?: Underwear, Pants, Socks, Shoes, AFO Position: Wheelchair/chair at sink Underwear - Performed by patient: Thread/unthread right underwear leg Underwear - Performed by helper: Thread/unthread left underwear leg, Pull underwear up/down Pants- Performed by patient: Thread/unthread right pants leg Pants- Performed by helper: Thread/unthread left pants leg, Pull pants up/down Non-skid slipper socks- Performed by helper: Don/doff right sock, Don/doff left sock Socks - Performed by patient: Don/doff right sock Socks - Performed by helper: Don/doff left sock Shoes - Performed by patient:  Don/doff right shoe, Fasten right Shoes - Performed by helper: Don/doff left shoe, Fasten left AFO - Performed by helper: Don/doff left AFO Assist for footwear: Partial/moderate assist Assist for lower body dressing: Touching or  steadying assistance (Pt > 75%)  Function - Toileting Toileting activity did not occur: No continent bowel/bladder event Toileting steps completed by patient: Performs perineal hygiene Toileting steps completed by helper: Adjust clothing prior to toileting, Adjust clothing after toileting Toileting Assistive Devices: Grab bar or rail Assist level: Touching or steadying assistance (Pt.75%)  Function - Toilet Transfers Toilet transfer assistive device: Elevated toilet seat/BSC over toilet, Grab bar Assist level to toilet: Touching or steadying assistance (Pt > 75%) Assist level from toilet: Touching or steadying assistance (Pt > 75%) Assist level to bedside commode (at bedside): Moderate assist (Pt 50 - 74%/lift or lower) Assist level from bedside commode (at bedside): Moderate assist (Pt 50 - 74%/lift or lower)  Function - Chair/bed transfer Chair/bed transfer method: Squat pivot Chair/bed transfer assist level: Supervision or verbal cues Chair/bed transfer assistive device: Sliding board Chair/bed transfer details: Verbal cues for precautions/safety, Verbal cues for safe use of DME/AE  Function - Locomotion: Wheelchair Will patient use wheelchair at discharge?:  (TBD) Type: Manual Max wheelchair distance: 150' Assist Level: Supervision or verbal cues Assist Level: Supervision or verbal cues Assist Level: Supervision or verbal cues Turns around,maneuvers to table,bed, and toilet,negotiates 3% grade,maneuvers on rugs and over doorsills: No Function - Locomotion: Ambulation Assistive device: No device Max distance: 65 Assist level: Touching or steadying assistance (Pt > 75%) Assist level: Touching or steadying assistance (Pt > 75%) Walk 50 feet with 2 turns activity did not occur: Safety/medical concerns Assist level: Touching or steadying assistance (Pt > 75%) Walk 150 feet activity did not occur: Safety/medical concerns Assist level: Moderate assist (Pt 50 - 74%) Walk 10 feet on  uneven surfaces activity did not occur: Safety/medical concerns  Function - Comprehension Comprehension: Auditory Comprehension assist level: Follows complex conversation/direction with extra time/assistive device  Function - Expression Expression: Verbal Expression assist level: Expresses complex ideas: With extra time/assistive device  Function - Social Interaction Social Interaction assist level: Interacts appropriately with others with medication or extra time (anti-anxiety, antidepressant).  Function - Problem Solving Problem solving assist level: Solves complex 90% of the time/cues < 10% of the time  Function - Memory Memory assist level: Recognizes or recalls 90% of the time/requires cueing < 10% of the time Patient normally able to recall (first 3 days only): Current season, That he or she is in a hospital, Staff names and faces, Location of own room   Medical Problem List and Plan: 1. 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 Formulized individual treatment plan based on medical history, underlying problem and comorbidities.  -left WHO at night  2. DVT Prophylaxis/Anticoagulation:  cont SCD off lovenox due to excessive bruising  3. Migraine Headaches/Pain Management: . On topamax 50 daily.   Sensory dysesthesia LLE improved  4. Mood: LCSW to follow for evaluation and support.  5. Neuropsych: This patient is not fullycapable of making decisions on herown behalf. 6. Skin/Wound Care: routine pressure relief measures  7. Fluids/Electrolytes/Nutrition: Liquids advanced to thins Monitor I/O.  Low alb will supplement with prostat  BMP normal 8/15   Protein supplement changed due to pt preference 8. Dyslipidemia: On Lipitor.  9. Thyroid nodule: follow up on outpatient basis 10.  Obesity: Educate on importance of weight loss with appropriate diet to promote health and mobility.  11. Question antiphospholipid syndrome: high Beta 2-glycoprotein IgG. Will need recheck labs in 12 weeks. 12.  Spasticity LUE and LLE MAS 2 in finger flexors, no flexor withdrawal in LLE  resting hand splint , + clonus at ankle  Increased tizanidine 25m qhs on 8/17--improved  Consider botox if poor control Am BP running low may be related to tizanidine 13.  Constipation: Improving   Cont miralax 15.  Urinary incont: Improving,  Cont toileting program 16. Fall on 8/10- no injury  Assisted fall  Frequent checks  Cont to monitor 17.  Insomnia- no change in meds but slept better yesterday LOS (Days) 32 A FACE TO FACE EVALUATION WAS PERFORMED  Stoy Fenn E 01/11/2017, 7:32 AM

## 2017-01-11 NOTE — Progress Notes (Signed)
Recreational Therapy Session Note  Patient Details  Name: Shannon Erickson MRN: [id number] Date of Birth: 1975-07-29 Today's Date: 01/11/2017  Pt participated in animal assisted activity/therapy seated w/c level with supervision.  [name] 01/11/2017, 3:44 PM

## 2017-01-11 NOTE — Progress Notes (Signed)
Speech Language Pathology Daily Session Note  Patient Details  Name: Shannon Erickson MRN: [id number] Date of Birth: [birth year]  Today's Date: 01/11/2017 SLP Individual Time: 1300-1355 SLP Individual Time Calculation (min): 55 min  Short Term Goals: Week 5: SLP Short Term Goal 1 (Week 5): Pt will complete semi-complex problem solving tasks with Supervision verbal cues.  SLP Short Term Goal 2 (Week 5): Pt will monitor and self-correct errors in the moment during semi-complex tasks with Supervision cues.  Skilled Therapeutic Interventions: Skilled treatment session focused on cognitive goals. SLP facilitated session by re-administering the MoCA (Version 7.3). Patient scored  27/30 points with a score of 26 or above considered normal. Patient demonstrated mild impairments in short-term recall and attention in a severely distracting environment.  Patient asking appropriate questions in regards to location of stroke and deficits. Patient provided education and a simplified picture of brain-behavior relationships for reinforcement. Patient verbalized understanding. Patient left upright in wheelchair with all needs within reach. Continue with current plan of care.      Function:   Cognition Comprehension Comprehension assist level: Follows complex conversation/direction with extra time/assistive device  Expression   Expression assist level: Expresses complex ideas: With extra time/assistive device  Social Interaction Social Interaction assist level: Interacts appropriately with others with medication or extra time (anti-anxiety, antidepressant).  Problem Solving Problem solving assist level: Solves complex 90% of the time/cues < 10% of the time  Memory Memory assist level: Recognizes or recalls 90% of the time/requires cueing < 10% of the time    Pain Pain Assessment Pain Score: 0-No pain  Therapy/Group: Individual Therapy  Luvia Orzechowski 01/11/2017, 1:54 PM

## 2017-01-12 ENCOUNTER — Inpatient Hospital Stay (HOSPITAL_COMMUNITY): Payer: Self-pay | Admitting: Occupational Therapy

## 2017-01-12 ENCOUNTER — Inpatient Hospital Stay (HOSPITAL_COMMUNITY): Payer: Medicaid Other | Admitting: Speech Pathology

## 2017-01-12 ENCOUNTER — Inpatient Hospital Stay (HOSPITAL_COMMUNITY): Payer: Self-pay | Admitting: Physical Therapy

## 2017-01-12 MED ORDER — TIZANIDINE HCL 2 MG PO TABS: 6.0000 mg | ORAL_TABLET | Freq: Every day | ORAL | 0 refills | Status: DC

## 2017-01-12 MED ORDER — TIZANIDINE HCL 2 MG PO TABS: 2.0000 mg | ORAL_TABLET | Freq: Two times a day (BID) | ORAL | 0 refills | Status: DC

## 2017-01-12 MED ORDER — TRAMADOL HCL 50 MG PO TABS: 50.0000 mg | ORAL_TABLET | Freq: Four times a day (QID) | ORAL | 0 refills | Status: DC | PRN

## 2017-01-12 MED ORDER — ASPIRIN 325 MG PO TABS: 325.0000 mg | ORAL_TABLET | Freq: Every day | ORAL | Status: DC

## 2017-01-12 MED ORDER — CLOPIDOGREL BISULFATE 75 MG PO TABS: 75.0000 mg | ORAL_TABLET | Freq: Every day | ORAL | 0 refills | Status: DC

## 2017-01-12 MED ORDER — TOPIRAMATE 50 MG PO TABS: 50.0000 mg | ORAL_TABLET | Freq: Every day | ORAL | 0 refills | Status: DC

## 2017-01-12 MED ORDER — POLYETHYLENE GLYCOL 3350 17 G PO PACK: 17.0000 g | PACK | Freq: Every day | ORAL | 0 refills | Status: DC

## 2017-01-12 MED ORDER — ATORVASTATIN CALCIUM 80 MG PO TABS: 80.0000 mg | ORAL_TABLET | Freq: Every day | ORAL | 0 refills | Status: DC

## 2017-01-12 NOTE — Progress Notes (Signed)
Occupational Therapy Session Note  Patient Details  Name: Shannon Erickson MRN: [id number] Date of Birth: Sep 10, 1975  Today's Date: 01/12/2017 OT Individual Time: 1040-1140 OT Individual Time Calculation (min): 60 min    Short Term Goals: Week 2:  OT Short Term Goal 1 (Week 2): Pt will maintain midline during bathing tasks while seated with supervision OT Short Term Goal 1 - Progress (Week 2): Met OT Short Term Goal 2 (Week 2): Pt will demonstrate proficiency with self ROM of LUE with min cues or less. OT Short Term Goal 2 - Progress (Week 2): Met OT Short Term Goal 3 (Week 2): Pt will demonstrate improved LUE body awareness by using R hand to position L arm prior to transfers. OT Short Term Goal 3 - Progress (Week 2): Progressing toward goal OT Short Term Goal 4 (Week 2): Pt will complete LB dressing with overall Mod A using hemi-techniques OT Short Term Goal 4 - Progress (Week 2): Met Week 3:  OT Short Term Goal 1 (Week 3): Pt will demonstrate improved LUE body awareness by using R hand to position L arm prior to transfers. OT Short Term Goal 1 - Progress (Week 3): Met OT Short Term Goal 2 (Week 3): Pt will complete shower transfers with consistent min A in and out of shower OT Short Term Goal 2 - Progress (Week 3): Met OT Short Term Goal 3 (Week 3): Pt will don AFO with min A OT Short Term Goal 3 - Progress (Week 3): Progressing toward goal OT Short Term Goal 4 (Week 3): Pt will thread both pant legs with min A OT Short Term Goal 4 - Progress (Week 3): Met  Skilled Therapeutic Interventions/Progress Updates:    1:1 NMR with focus on breaking up synergistic tone patterns with normal movements, AAROM and stretching and weight bearing during functional reach.  Pt performed stand pivot transfers with steadying A.  Performed PNF in sidelying and sitting with focus on relaxation of mm during extension and A for initiation of movement during AROM with scapular movements, bicep/tricep and  wrist. Pt with tightness in left pectoral region and in bicep - stretches in supine with shoulder abduction. Pt able to relax tone with extra time and visual attention to UE. Pt performed functional reaching with right hand while weight bearing through left with result of decr tone. Pt ambulated towards room with min A with hemi walker ~50 feet. Left in room with daughter at end of session.   Therapy Documentation Precautions:  Precautions Precautions: Fall Precaution Comments: L hemiplegia Restrictions Weight Bearing Restrictions: No    Pain: No c/o pain  See Function Navigator for Current Functional Status.   Therapy/Group: Individual Therapy  Willeen Cass [facility] 01/12/2017, 1:24 PM

## 2017-01-12 NOTE — Progress Notes (Signed)
Occupational Therapy Session Note  Patient Details  Name: Shannon Erickson MRN: [id number] Date of Birth: January 16, 1976  Today's Date: 01/12/2017 OT Individual Time: 1415-1445 OT Individual Time Calculation (min): 30 min    Short Term Goals: Week 5:  OT Short Term Goal 1 (Week 5): LTG = STG 2/2 ELOS  Skilled Therapeutic Interventions/Progress Updates:    Treatment session focused on ADL/self care training, transfer training, pt education, and neuromuscular reeducation. Pt resting in bed and agreed to OT session. She sat EOB and donned both socks with close guard assistance and donned shoes with mod A to don L shoe and AFO. She transferred bed<>w/c with CGA. Therapist provided pt with neuromuscular stretches and demonstration on self stretching techniques to L UE. Pt noted to have tightness and limited ROM in shoulder. Pt instructed on donning/doffing L soft splint and required min A for splint placement when donning. Pt and therapist discuss discharge home. Pt practices getting in<>out bed with verbal cues for hand/foot placement. Pt left resting in her bed with ll needs met.    Patient Vitals for the past 24 hrs:  Urine Occurrence  01/12/17 1340 1  01/12/17 0535 1  01/12/17 0330 1  01/12/17 0023 1  01/11/17 2102 1    Therapy Documentation Precautions:  Precautions Precautions: Fall Precaution Comments: L hemiplegia Restrictions Weight Bearing Restrictions: No General:   Vital Signs: Therapy Vitals Temp: 98.9 F (37.2 C) Temp Source: Oral Pulse Rate: 97 Resp: 20 BP: 120/65 Patient Position (if appropriate): Lying Oxygen Therapy SpO2: 100 % O2 Device: Not Delivered Pain: Pain Assessment Pain Assessment: No/denies pain ADL:   Vision   Perception    Praxis   Exercises:   Other Treatments:    See Function Navigator for Current Functional Status.   Therapy/Group: Individual Therapy  Delon [place] 01/12/2017, 3:45 PM

## 2017-01-12 NOTE — Progress Notes (Signed)
Social Work Patient ID: Shannon Erickson, female   DOB: 1975-06-15, 41 y.o.   MRN: [id number] PT seeing pt tomorrow at 11:00 not 10:00 have informed pt of this and she will rely the information to Dad.

## 2017-01-12 NOTE — Progress Notes (Addendum)
Social Work  Discharge Note  The overall goal for the admission was met for:   Discharge location: [place] [facility]  Length of Stay: Yes-35 DAYS  Discharge activity level: Yes-SUPERVISION-MIN ASSIST LEVEL  Home/community participation: Yes  Services provided included: MD, RD, PT, OT, SLP, RN, CM, TR, Pharmacy, Neuropsych and SW  Financial Services: Other: PENDING MEDICAID  Follow-up services arranged: Home Health: [facility]-PT,OT,SP, DME: [facility]-WHEELCHAIR, 3IN1,TUB BENCH AND HEMI WALKER and Patient/Family has no preference for HH/DME agencies  Comments (or additional information):DAUGHTER HAS BEEN HERE DAILY AND PARTICIPATED IN THERAPIES WITH PT. BOTH COMFORTABLE WITH DISCHARGE AND PT'S CARE NEEDS. DAUGHTER TO FOLLOW UP WITH [employer] AND MEDICAID APPLICATIONS.  [facility] APPLICATION AND FINANCIAL FORMS GIVEN TO PT/DTR FOR THEM TO COMPLETE AND TAKE TO [facility] TO ESTABLISH CARE.  MATCH PROGRAM FOR 1ST MONTH'S SUPPLY OF MEDICATIONS, THEN PT SHOULD F/U WITH CLINIC PHARMACY OR USE MEDICAID BENEFIT (WHEN MEDICAID IS IN PLACE).   Patient/Family verbalized understanding of follow-up arrangements: Yes  Individual responsible for coordination of the follow-up plan: SELF & DAUGHTER  Confirmed correct DME delivered: Elease Hashimoto 01/12/2017    Elease Hashimoto

## 2017-01-12 NOTE — Progress Notes (Signed)
Physical Therapy Session Note  Patient Details  Name: Shannon Erickson MRN: [id number] Date of Birth: [birth year]  Today's Date: 01/12/2017 PT Individual Time: 0900-0956 PT Individual Time Calculation (min): 56 min   Short Term Goals: Week 4:  PT Short Term Goal 1 (Week 4): Equal LTGs  Skilled Therapeutic Interventions/Progress Updates:    no c/o pain.  Session focus on high level ambulation and NMR.    Pt dons socks and shoes with min assist and transfers from low couch to w/c with min assist for steadying.    Pt ambulates 2x through obstacle course with min assist overall, focus on compliant surfaces and navigation over and around obstacles.  PT instructs pt in curb step negotiation with hemiwalker x3 with min guard and mod fade to supervision verbal cues for sequencing.  Rest breaks as needed for fatigue.    Nustep x10 minutes with L hand/leg orthosis focus on reciprocal stepping pattern retraining, forced use, positioning, and activity tolerance.    Pt returned to room at end of session and positioned in w/c with call bell in reach and needs met.   Therapy Documentation Precautions:  Precautions Precautions: Fall Precaution Comments: L hemiplegia Restrictions Weight Bearing Restrictions: No   See Function Navigator for Current Functional Status.   Therapy/Group: Individual Therapy  Michel Santee 01/12/2017, 9:57 AM

## 2017-01-12 NOTE — Progress Notes (Signed)
Speech Language Pathology Daily Session Note  Patient Details  Name: Shannon Erickson MRN: [id number] Date of Birth: 07/13/1975  Today's Date: 01/12/2017 SLP Individual Time: 0730-0810 SLP Individual Time Calculation (min): 40 min  Short Term Goals: Week 5: SLP Short Term Goal 1 (Week 5): Pt will complete semi-complex problem solving tasks with Supervision verbal cues.  SLP Short Term Goal 2 (Week 5): Pt will monitor and self-correct errors in the moment during semi-complex tasks with Supervision cues.  Skilled Therapeutic Interventions: Skilled treatment session focused on cognitive goals. SLP facilitated session by completing patient education in regards to her current cognitive function and strategies to utilize at home to maximize recall of functional information including a routine/schedule. Patient verbalized understanding and generated a schedule for home with supervision verbal cues. Patient left upright in wheelchair with all needs within reach. Continue with current plan of care.      Function:  Cognition Comprehension Comprehension assist level: Follows complex conversation/direction with extra time/assistive device  Expression   Expression assist level: Expresses complex ideas: With extra time/assistive device  Social Interaction Social Interaction assist level: Interacts appropriately with others with medication or extra time (anti-anxiety, antidepressant).  Problem Solving Problem solving assist level: Solves complex 90% of the time/cues < 10% of the time  Memory Memory assist level: Recognizes or recalls 90% of the time/requires cueing < 10% of the time    Pain No/Denies Pain   Therapy/Group: Individual Therapy  [name] 01/12/2017, 3:37 PM

## 2017-01-13 ENCOUNTER — Inpatient Hospital Stay (HOSPITAL_COMMUNITY): Payer: Self-pay | Admitting: Physical Therapy

## 2017-01-13 ENCOUNTER — Inpatient Hospital Stay (HOSPITAL_COMMUNITY): Payer: Self-pay | Admitting: Occupational Therapy

## 2017-01-13 ENCOUNTER — Inpatient Hospital Stay (HOSPITAL_COMMUNITY): Payer: Self-pay

## 2017-01-13 NOTE — Plan of Care (Signed)
Problem: RH Functional Use of Upper Extremity Goal: LTG Patient will use RT/LT upper extremity as a (OT) LTG: Patient will use right/left upper extremity as a stabilizer/gross assist/diminished/nondominant/dominant level with assist, with/without cues during functional activity (OT)  Goal not met 2/2 flaccidity/decreased muscle activation in RUE impacting functional use SMS

## 2017-01-13 NOTE — Progress Notes (Signed)
Speech Language Pathology Discharge Summary  Patient Details  Name: Nishtha Raider MRN: [id number] Date of Birth: 26-Aug-1975   Patient has met 10 of 10 long term goals.  Patient to discharge at overall Supervision level.   Reasons goals not met: N/A    Clinical Impression/Discharge Summary: Patient has made excellent gains and has met 10 of 10 LTGs this admission. Currently, patient is consuming regular textures with thin liquids without overt s/s of aspiration and is Mod I for use of swallowing compensatory strategies. Patient is also 100% intelligible at the conversation with Mod I.  Patient has made excellent cognitive gains and currently requires overall supervision verbal cues for complex problem solving, recall, awareness and attention. Patient and family education is complete and patient will discharge home with 24 hour supervision from family. Patient would benefit from f/u SLP services to maximize her cognitive function and overall functional independence.   Care Partner:  Caregiver Able to Provide Assistance: Yes  Type of Caregiver Assistance: Physical;Cognitive  Recommendation:  24 hour supervision/assistance;Outpatient SLP  Rationale for SLP Follow Up: Maximize cognitive function and independence   Equipment: N/A    Reasons for discharge: Treatment goals met   Patient/Family Agrees with Progress Made and Goals Achieved: Yes     Breawna [place]en[place], [place] 01/13/2017, 7:12 AM

## 2017-01-13 NOTE — Progress Notes (Signed)
Subjective/Complaints:   No issues overnite  ROS: pt denies nausea, vomiting, diarrhea, cough, shortness of breath or chest pain   Objective: Vital Signs: Blood pressure 110/75, pulse 80, temperature 98.1 F (36.7 C), temperature source Oral, resp. rate 18, height 5' 3"  (1.6 m), weight 88 kg (193 lb 15.6 oz), SpO2 100 %. No results found. Results for orders placed or performed during the hospital encounter of 12/10/16 (from the past 72 hour(s))  Basic metabolic panel     Status: Abnormal   Collection Time: 01/11/17  5:01 AM  Result Value Ref Range   Sodium 140 135 - 145 mmol/L   Potassium 4.1 3.5 - 5.1 mmol/L   Chloride 112 (H) 101 - 111 mmol/L   CO2 18 (L) 22 - 32 mmol/L   Glucose, Bld 115 (H) 65 - 99 mg/dL   BUN 10 6 - 20 mg/dL   Creatinine, Ser 0.75 0.44 - 1.00 mg/dL   Calcium 8.1 (L) 8.9 - 10.3 mg/dL   GFR calc non Af Amer >60 >60 mL/min   GFR calc Af Amer >60 >60 mL/min    Comment: (NOTE) The eGFR has been calculated using the CKD EPI equation. This calculation has not been validated in all clinical situations. eGFR's persistently <60 mL/min signify possible Chronic Kidney Disease.    Anion gap 10 5 - 15     Gen NAD. Vital signs reivewed HEENT: Normocephalic, atraumatic Cardio: RRR without murmur. No JVD   Resp: CTA Bilaterally without wheezes or rales. Normal effort  GI: BS positive and ND Musc/Skel:  No edema, no tenderness Neuro: Alert Motor: 4-/5 Left HF KE, 0/5 at ankle 0/5 in LUE to command, however, moving against gravity on arrival this AM. Spastic tone throughout, 1-2/4 LUE flexor pattern Skin:   Intact. Warm and dry. Right knee without erythema, warmth  Assessment/Plan: 1. Functional deficits secondary to Right MCA infarct which require 3+ hours per day of interdisciplinary therapy in a comprehensive inpatient rehab setting. Physiatrist is providing close team supervision and 24 hour management of active medical problems listed below. Physiatrist and  rehab team continue to assess barriers to discharge/monitor patient progress toward functional and medical goals. FIM: Function - Bathing Bathing activity did not occur: Refused Position: Wheelchair/chair at sink Body parts bathed by patient: Right arm, Left arm, Chest, Abdomen, Front perineal area, Buttocks, Right upper leg, Left upper leg Body parts bathed by helper: Back Assist Level: Touching or steadying assistance(Pt > 75%)  Function- Upper Body Dressing/Undressing What is the patient wearing?: Pull over shirt/dress, Bra Bra - Perfomed by patient: Thread/unthread left bra strap, Thread/unthread right bra strap Bra - Perfomed by helper: Hook/unhook bra (pull down sports bra) Pull over shirt/dress - Perfomed by patient: Thread/unthread right sleeve, Thread/unthread left sleeve, Put head through opening, Pull shirt over trunk Pull over shirt/dress - Perfomed by helper: Thread/unthread left sleeve, Pull shirt over trunk Assist Level: Supervision or verbal cues, More than reasonable time Function - Lower Body Dressing/Undressing What is the patient wearing?: Socks, Shoes Position: Sitting EOB Underwear - Performed by patient: Thread/unthread right underwear leg Underwear - Performed by helper: Thread/unthread left underwear leg, Pull underwear up/down Pants- Performed by patient: Thread/unthread right pants leg Pants- Performed by helper: Thread/unthread left pants leg, Pull pants up/down Non-skid slipper socks- Performed by helper: Don/doff right sock, Don/doff left sock Socks - Performed by patient: Don/doff left sock, Don/doff right sock Socks - Performed by helper: Don/doff left sock Shoes - Performed by patient: Don/doff right shoe,  Fasten right Shoes - Performed by helper: Don/doff left shoe, Fasten left AFO - Performed by helper: Don/doff left AFO Assist for footwear: Supervision/touching assist Assist for lower body dressing: Touching or steadying assistance (Pt >  75%)  Function - Toileting Toileting activity did not occur: No continent bowel/bladder event Toileting steps completed by patient: Adjust clothing prior to toileting, Performs perineal hygiene, Adjust clothing after toileting Toileting steps completed by helper: Adjust clothing prior to toileting, Adjust clothing after toileting Toileting Assistive Devices: Grab bar or rail Assist level: Supervision or verbal cues  Function - Toilet Transfers Toilet transfer assistive device: Elevated toilet seat/BSC over toilet, Grab bar Assist level to toilet: Touching or steadying assistance (Pt > 75%) Assist level from toilet: Touching or steadying assistance (Pt > 75%) Assist level to bedside commode (at bedside): Moderate assist (Pt 50 - 74%/lift or lower) Assist level from bedside commode (at bedside): Moderate assist (Pt 50 - 74%/lift or lower)  Function - Chair/bed transfer Chair/bed transfer method: Stand pivot (from low couch) Chair/bed transfer assist level: Touching or steadying assistance (Pt > 75%) Chair/bed transfer assistive device: Armrests Chair/bed transfer details: Tactile cues for posture  Function - Locomotion: Wheelchair Will patient use wheelchair at discharge?:  (TBD) Type: Manual Max wheelchair distance: 150' Assist Level: Supervision or verbal cues Assist Level: Supervision or verbal cues Assist Level: Supervision or verbal cues Turns around,maneuvers to table,bed, and toilet,negotiates 3% grade,maneuvers on rugs and over doorsills: No Function - Locomotion: Ambulation Assistive device: Walker-hemi Max distance: 60 Assist level: Touching or steadying assistance (Pt > 75%) Assist level: Touching or steadying assistance (Pt > 75%) Walk 50 feet with 2 turns activity did not occur: Safety/medical concerns Assist level: Touching or steadying assistance (Pt > 75%) Walk 150 feet activity did not occur: Safety/medical concerns Assist level: Moderate assist (Pt 50 -  74%) Walk 10 feet on uneven surfaces activity did not occur: Safety/medical concerns  Function - Comprehension Comprehension: Auditory Comprehension assist level: Follows complex conversation/direction with extra time/assistive device  Function - Expression Expression: Verbal Expression assist level: Expresses complex ideas: With extra time/assistive device  Function - Social Interaction Social Interaction assist level: Interacts appropriately with others with medication or extra time (anti-anxiety, antidepressant).  Function - Problem Solving Problem solving assist level: Solves complex 90% of the time/cues < 10% of the time  Function - Memory Memory assist level: Recognizes or recalls 90% of the time/requires cueing < 10% of the time Patient normally able to recall (first 3 days only): Current season, That he or she is in a hospital, Staff names and faces, Location of own room   Medical Problem List and Plan: 1. Left hemiparesis secondary to Right MCA infarct  ASA and Plavix  Cont CIR,PT, OT, SLP progressing toward goals -left WHO at night  2. DVT Prophylaxis/Anticoagulation:  cont SCD off lovenox due to excessive bruising  3. Migraine Headaches/Pain Management: . On topamax 50 daily.   Sensory dysesthesia LLE improved  4. Mood: LCSW to follow for evaluation and support.  5. Neuropsych: This patient is not fullycapable of making decisions on herown behalf. 6. Skin/Wound Care: routine pressure relief measures  7. Fluids/Electrolytes/Nutrition: Liquids advanced to thins Monitor I/O.  Low alb will supplement with prostat  BMP normal 8/15   Protein supplement changed due to pt preference 8. Dyslipidemia: On Lipitor. 9. Thyroid nodule: follow up on outpatient basis 10. Obesity: Educate on importance of weight loss with appropriate diet to promote health and mobility.  11. Question antiphospholipid  syndrome: high Beta 2-glycoprotein IgG. Will need recheck labs in 12 weeks. 12.   Spasticity LUE and LLE MAS 2 in finger flexors, no flexor withdrawal in LLE  resting hand splint , + clonus at ankle  Increased tizanidine 17m qhs on 8/17--improved  Consider botox finger flexors  13.  Constipation: Improving   Cont miralax 15.  Urinary incont: Improving,  Cont toileting program 16. Fall on 8/10- no injury  Assisted fall  Frequent checks  Cont to monitor 17.  Insomnia- improved, may be higher dose tizanidine at night LOS (Days) 34 A FACE TO FACE EVALUATION WAS PERFORMED  KIRSTEINS,ANDREW E 01/13/2017, 6:45 AM

## 2017-01-13 NOTE — Progress Notes (Signed)
Physical Therapy Discharge Summary  Patient Details  Name: Shannon Erickson MRN: [id number] Date of Birth: 09/19/1975  Today's Date: 01/13/2017 PT Individual Time: 1100-1200 PT Individual Time Calculation (min): 60 min    Patient has met 10 of 10 long term goals due to improved activity tolerance, improved balance, improved postural control, increased strength, ability to compensate for deficits, functional use of  left lower extremity, improved attention, improved awareness and improved coordination.  Patient to discharge at an ambulatory level Min Assist.   Patient's care partner is independent to provide the necessary physical assistance at discharge.  Recommendation:  Patient will benefit from ongoing skilled PT services in home health setting to continue to advance safe functional mobility, address ongoing impairments in tone, perception, postural control, balance, and coordination, and minimize fall risk.  Equipment: hemiwalker, ultra hemi height w/c 18x18  Reasons for discharge: treatment goals met  Patient/family agrees with progress made and goals achieved: Yes   Skilled PT Intervention: Session focus on family education and d/c assessment.  Pt's father and daughter present and participated in hands on family training for basic transfers, car transfers, gait, and stair negotiation.  Pt currently performing all mobility with min assist or better using hemiwalker.  Recommend continued hands on assist for ambulation at home and 24/7 supervision for safety.  Plan for installing ramp this weekend or Monday and family comfortable with providing assist for pt to negotiate steps for home entry.  Pt returned to room at end of session and positioned in w/c with call bell in reach and needs met.   PT Discharge Precautions/Restrictions Precautions Precautions: Fall Precaution Comments: L hemiplegia Restrictions Weight Bearing Restrictions: No Pain Pain Assessment Pain Assessment:  No/denies pain Vision/Perception  Perception Perception: Within Functional Limits Praxis Praxis: Intact  Cognition Arousal/Alertness: Awake/alert Orientation Level: Oriented X4 Attention: Selective Sustained Attention: Appears intact Selective Attention: Appears intact Awareness: Impaired Awareness Impairment: Anticipatory impairment Safety/Judgment: Appears intact Sensation Sensation Light Touch: Appears Intact (LEs and UEs) Coordination Gross Motor Movements are Fluid and Coordinated: No Fine Motor Movements are Fluid and Coordinated: No Heel Shin Test: normal on R, impaired on L 2/2 weakness and tone Motor  Motor Motor: Hemiplegia;Abnormal tone  Mobility Bed Mobility Bed Mobility: Supine to Sit;Sit to Supine Supine to Sit: 5: Supervision;HOB flat Sit to Supine: 5: Supervision;HOB flat Transfers Transfers: Yes Sit to Stand: 5: Supervision Stand to Sit: 5: Supervision Stand Pivot Transfers: 4: Min guard Squat Pivot Transfers: 5: Supervision Locomotion  Ambulation Ambulation: Yes Ambulation/Gait Assistance: 4: Min guard Ambulation Distance (Feet): 120 Feet Assistive device: Hemi-walker Gait Gait Pattern: Step-to pattern;Decreased stance time - left;Decreased weight shift to left;Narrow base of support Stairs / Additional Locomotion Stairs: Yes Stairs Assistance: 4: Min assist;3: Mod assist Stair Management Technique: No rails;Forwards Number of Stairs: 8 Height of Stairs: 6 Wheelchair Mobility Wheelchair Mobility: Yes Wheelchair Assistance: 5: Supervision Wheelchair Propulsion: Right upper extremity;Right lower extremity Wheelchair Parts Management: Supervision/cueing  Trunk/Postural Assessment     Balance Balance Balance Assessed: Yes Static Sitting Balance Static Sitting - Balance Support: Feet supported Static Sitting - Level of Assistance: 5: Stand by assistance Dynamic Sitting Balance Dynamic Sitting - Balance Support: Feet supported Dynamic  Sitting - Level of Assistance: 5: Stand by assistance Static Standing Balance Static Standing - Balance Support: Right upper extremity supported Static Standing - Level of Assistance: 5: Stand by assistance Dynamic Standing Balance Dynamic Standing - Balance Support: Right upper extremity supported Dynamic Standing - Level of Assistance: 4: Min assist Extremity   Assessment      RLE Assessment RLE Assessment: Within Functional Limits (4+/5 throughout) LLE Assessment LLE Assessment: Exceptions to [facility] LLE Strength Left Hip Flexion: 3/5 Left Knee Flexion: 2/5 Left Knee Extension: 3+/5 LLE Tone LLE Tone: Mild LLE Tone Comments: improving LLE flexor tone, though still present   See Function Navigator for Current Functional Status.  Michel Santee 01/13/2017, 12:20 PM

## 2017-01-13 NOTE — Progress Notes (Signed)
Social Work Patient ID: Shannon Erickson, female   DOB: [birth year], 41 y.o.   MRN: [id number]  Met with pt, daughter and father who went through PT therapy with pt to see her progress. He was amazed how well she has done. Went over [employer] and gave to daughter to get prescriptions filled today and gave handicapped application for placard. All ready for DC tomorrow, equipment delivered to room today.

## 2017-01-13 NOTE — Progress Notes (Signed)
Occupational Therapy Session Note  Patient Details  Name: Shannon Erickson MRN: [id number] Date of Birth: 05/20/1976  Today's Date: 01/13/2017 OT Individual Time: 5331-7409 OT Individual Time Calculation (min): 70 min    Short Term Goals: Week 5:  OT Short Term Goal 1 (Week 5): LTG = STG 2/2 ELOS  Skilled Therapeutic Interventions/Progress Updates:    Treatment session focused on ADLs/self care training, transfer training, balance training, NMR, activity tolerance, and safety awareness. Upon entering room pt agreed to therapy and noted to be going home in the AM with family. Pt and therapist discuss home discharge and agreed to practice LB donning/doffing shoes and AFo using AE. Pt continues to require assistance with donning AFO although different techniques were tried. Pt provided with NMR to L Ue including PROM and prolonged stretch to shoulder, elbow, wrist and digits in all planes as tolerated. Pt instructed on self ROM using R UE. Pt completed sit<>stand transfer training, standing tolerance task with focus on weight shifting, torso rotation, and lateral leans with CGA without support to increase functional standing endurance and balance. Pt completed toileting task with CGA and v/c for safety. Pt used hemiwalker during functional mobility in room and repositioned self in bed at end of session to rest. Pt did not report pain and was left resting with needs met.   Therapy Documentation Precautions:  Precautions Precautions: Fall Precaution Comments: L hemiplegia Restrictions Weight Bearing Restrictions: No Pain: Pain Assessment Pain Assessment: No/denies pain  See Function Navigator for Current Functional Status.   Therapy/Group: Individual Therapy  Delon [place] 01/13/2017, 3:37 PM

## 2017-01-14 NOTE — Progress Notes (Addendum)
Shannon Erickson is a 41 y.o. female 04-15-1976 [id number]  Subjective: No new complaints. No new problems. Slept well. Feeling OK.  Objective: Vital signs in last 24 hours: Temp:  [97.4 F (36.3 C)-98.6 F (37 C)] 98.6 F (37 C) (08/25 0444) Pulse Rate:  [79-93] 79 (08/25 0444) Resp:  [18] 18 (08/25 0444) BP: (105-114)/(53-76) 105/53 (08/25 0444) SpO2:  [98 %] 98 % (08/25 0444) Weight change:  Last BM Date: 01/12/17  Intake/Output from previous day: 08/24 0701 - 08/25 0700 In: 240 [P.O.:240] Out: -  Last cbgs: CBG (last 3)  No results for input(s): GLUCAP in the last 72 hours.   Physical Exam General: No apparent distress   HEENT: not dry Lungs: Normal effort. Lungs clear to auscultation, no crackles or wheezes. Cardiovascular: Regular rate and rhythm, no edema Abdomen: S/NT/ND; BS(+) Musculoskeletal:  unchanged Neurological: No new neurological deficits: L hemiparesis Wounds: N/A    Skin: clear   Mental state: Alert, oriented, cooperative    Lab Results: BMET    Component Value Date/Time   NA 140 01/11/2017 0501   K 4.1 01/11/2017 0501   CL 112 (H) 01/11/2017 0501   CO2 18 (L) 01/11/2017 0501   GLUCOSE 115 (H) 01/11/2017 0501   BUN 10 01/11/2017 0501   CREATININE 0.75 01/11/2017 0501   CALCIUM 8.1 (L) 01/11/2017 0501   GFRNONAA >60 01/11/2017 0501   GFRAA >60 01/11/2017 0501   CBC    Component Value Date/Time   WBC 5.4 01/04/2017 0454   RBC 4.48 01/04/2017 0454   HGB 12.2 01/04/2017 0454   HCT 38.1 01/04/2017 0454   PLT 269 01/04/2017 0454   MCV 85.0 01/04/2017 0454   MCH 27.2 01/04/2017 0454   MCHC 32.0 01/04/2017 0454   RDW 13.6 01/04/2017 0454   LYMPHSABS 2.7 12/11/2016 0443   MONOABS 0.8 12/11/2016 0443   EOSABS 0.2 12/11/2016 0443   BASOSABS 0.0 12/11/2016 0443    Studies/Results: No results found.  Medications: I have reviewed the patient's current medications.  Assessment/Plan:   1. L hemiparesis, R MCA CVA - ASA and Plavix.  D/c home today 2. DVT prophylaxis 3. Migraines: Topamax 4. Dyslipidemia: Lipitor 5. Constipation: Miralax 6. Insomnia - better   Length of stay, days: 35  Sonda Primes , MD 01/14/2017, 8:25 AM

## 2017-01-14 NOTE — Progress Notes (Signed)
Patient discharged. Nurse tech escorted patient and her father to lobby with discharge instructions and belongings in their possession.

## 2017-01-16 ENCOUNTER — Telehealth: Payer: Self-pay | Admitting: *Deleted

## 2017-01-16 NOTE — Telephone Encounter (Signed)
[facility] PT called for plan of care 2 wk 4 for gait training and balance and fall prevention.  Approval given.

## 2017-01-17 DIAGNOSIS — R252 Cramp and spasm: Secondary | ICD-10-CM

## 2017-01-17 NOTE — Discharge Summary (Signed)
Physician Discharge Summary  Patient ID: Shannon Erickson MRN: [id number] DOB/AGE: [birth year] 40 y.o.  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  Labs:  Basic Metabolic Panel: BMP Latest Ref Rng & Units 01/11/2017 01/04/2017 12/28/2016  Glucose 65 - 99 mg/dL 782(N) 562(Z) 308(M)  BUN 6 - 20 mg/dL 10 12 11   Creatinine 0.44 - 1.00 mg/dL 5.78 4.69 6.29  Sodium 135 - 145 mmol/L 140 140 139  Potassium 3.5 - 5.1 mmol/L 4.1 4.1 4.3  Chloride 101 - 111 mmol/L 112(H) 108 110  CO2 22 - 32 mmol/L 18(L) 25 21(L)  Calcium 8.9 - 10.3 mg/dL 8.1(L) 8.4(L) 8.3(L)    CBC: CBC Latest Ref Rng & Units 01/04/2017 12/28/2016 12/21/2016  WBC 4.0 - 10.5 K/uL 5.4 6.0 6.4  Hemoglobin 12.0 - 15.0 g/dL 52.8 41.3 24.4  Hematocrit 36.0 - 46.0 % 38.1 40.2 36.9  Platelets 150 - 400 K/uL 269 297 292    CBG: No results for input(s): GLUCAP in the last 168 hours.  Brief HPI:   Shannon Erickson is a 41 year old female with history of meningitis, migraines who was admitted on 12/03/16 with severe right frontoparietal HA, slurred speech, lethargyand left sided weakness. CT head negative. MRI brain done revealing "Large RIGHT MCA territory nonhemorrhagic infarct involving temporal lobe, frontal lobe, insula, basal ganglia and regional white matter with early mass effect and 1-2 mm. EEG done showing right hemispheric slowing with diffuse cerebral dysfunction  due to encephalopathy. BLE dopplers were negative for DVT. TEE done revealing EF 50% with no thrombus, no PFO or ASD. CTA neck showed focal irregularity at right carotid bifurcation with self like plaque and 11 mm thyroid nodule. Stroke felt to be embolic from R-ICA plaque and Dr. Roda Shutters recommended ASA/Plavix X 3 months followed by Plavix alone. Follow up CT head 7/19 stable and lethargy resolving with improvement in headaches. Coagulopathy panel with high beta 2 glycoprotein IgG and will need repeat test in 12 weeks due question of antiphospholipid syndrome. Patient with resultant left sided weakness, left inattention, delayed processing with cognitive deficits and dysphagia. CIR recommended by rehab team.    Hospital Course: Shannon Erickson was admitted to rehab 12/10/2016 for inpatient therapies to consist of PT, ST and OT at least three hours five days a week. Past admission physiatrist, therapy team and rehab RN have worked together to provide customized collaborative inpatient rehab. She has been maintained on ASA/plavix and has been tolerating this without SE.  Renal status has been stable and protein supplement was added to help with low calorie malnutrition. She has been educated on appropriate diet to help with weight loss and promote health and mobility.  Topamax was changed to bedtime to decrease sedation and headaches have improved. She has had issues with incotince at nights and when asleep. Scheduled toileting was started to help with continence. She has developed spasticity LUE/LLE and was started on tizanidine. Nightime dose was titrated upwards to 6 mg with improvement  in symptoms. Left resting hand splint was ordered to be used at bedtime and she was fitted with L-AFO to help with gait. She has made steady progress during her rehab stay and has progressed to supervision to min assist level. She will continue to receive follow up HHPT, HHOT and HHST by [facility] after discharge. She  has been set up with [facility] to help with meds and has been given paperwork to follow up with [facility] for primary care in [facility] course: During patient's stay in rehab weekly team conferences were held to monitor patient's progress, set goals and discuss barriers to discharge. At admission, patient required max assist with basic self-care tasks and mobility. She displayed mild dysphagia with deficits in semi complex problem solving and MoCA score 23/30.  She has had improvement in activity tolerance, balance, postural control, as well as ability to compensate for deficits.  She is has had improvement in functional use LUE  and LLE as well as improved awareness. She is able to complete ADL tasks with supervision and requires mod assist to don shoe and AFO. She is able to complete toilieting tasks with CGA and cues for safety. She requires min guard to supervision for transfers and is able to ambulate 120' with min guard assist. She is tolerating regular diet without s/s of aspiration. She requires supervisory cues for complex problem solving, recall, awareness and attention.  Her daughter has been supportive and has participated daily in multiple therapy sessions. Family education was completed regarding all aspects of care and safety.     Disposition: 01-Home or Self Care  Diet: Cardiac diet. Limit carbs/sweets.    Special Instructions: 1. Will need primary MD to  work up/follow up on thyroid nodule. 2.  Needs 24 hours supervision.    Discharge Instructions    Ambulatory referral to Physical Medicine Rehab    Complete by:  As directed    1-2 weeks transitional care appt     Allergies as of 01/14/2017      Reactions   Penicillins Hives   Has patient had a PCN reaction causing immediate rash, facial/tongue/throat swelling, SOB or lightheadedness with hypotension: Yes Has patient had a PCN reaction causing severe rash involving mucus membranes or skin necrosis: Yes Has patient had  a PCN reaction that required hospitalization: Unk Has patient had a PCN reaction occurring within the last 10 years: No If all of the above answers are "NO", then may proceed with Cephalosporin use.      Medication List    STOP taking these medications   aspirin-acetaminophen-caffeine 250-250-65 MG tablet Commonly known as:  EXCEDRIN MIGRAINE     TAKE these medications   acetaminophen 325 MG tablet Commonly known as:  TYLENOL Take 650 mg by mouth every 6 (six) hours as needed for mild pain.   aspirin 325 MG tablet Take 1 tablet (325 mg total) by mouth daily.   atorvastatin 80 MG tablet Commonly known as:  LIPITOR Take 1 tablet (80 mg total) by mouth daily at 6 PM.   clopidogrel 75 MG tablet Commonly known as:  PLAVIX Take 1 tablet (75 mg total) by mouth daily.   polyethylene glycol packet Commonly known as:  MIRALAX / GLYCOLAX Take 17 g by mouth daily.   tiZANidine 2 MG tablet Commonly known as:  ZANAFLEX Take 1 tablet (2 mg total) by mouth 2 (two) times daily.   tiZANidine 2 MG tablet Commonly known as:  ZANAFLEX Take 3  tablets (6 mg total) by mouth at bedtime.   topiramate 50 MG tablet Commonly known as:  TOPAMAX Take 1 tablet (50 mg total) by mouth daily.   traMADol 50 MG tablet Commonly known as:  ULTRAM Take 1 tablet (50 mg total) by mouth every 6 (six) hours as needed for moderate pain.          Follow-up Information    Kirsteins, Victorino Sparrow, MD Follow up.   Specialty:  Physical Medicine and Rehabilitation Why:  office will call you with follow up appointment Contact information: [address] [place] [address] [phone]        Marvel Plan, MD. Call in 1 day(s).   Specialty:  Neurology Why:  for follow up appointment in 4 weeks Contact information: [address] [place] [address] [phone]           Signed: Jacquelynn Cree 01/17/2017, 10:50 AM

## 2017-01-20 ENCOUNTER — Telehealth: Payer: Self-pay

## 2017-01-20 NOTE — Telephone Encounter (Signed)
Hershal CoriaJulie ST [facility] called [phone] requesting verbal orders for 1xwk X 4wks, called her back and approved her orders

## 2017-01-30 ENCOUNTER — Other Ambulatory Visit: Payer: Self-pay | Admitting: *Deleted

## 2017-01-30 DIAGNOSIS — Z8661 Personal history of infections of the central nervous system: Secondary | ICD-10-CM

## 2017-01-30 DIAGNOSIS — I69354 Hemiplegia and hemiparesis following cerebral infarction affecting left non-dominant side: Secondary | ICD-10-CM

## 2017-01-30 DIAGNOSIS — I69321 Dysphasia following cerebral infarction: Secondary | ICD-10-CM

## 2017-01-30 DIAGNOSIS — G43909 Migraine, unspecified, not intractable, without status migrainosus: Secondary | ICD-10-CM

## 2017-01-30 NOTE — Patient Outreach (Addendum)
[facility] Care Management  01/30/2017  Shannon DiceCynthia Erickson 12/10/1975 [id number]   EMMI-Stroke RED ON EMMI ALERT DAY#: 13 DATE: 01/29/17 RED ALERT: Shannon FlesherWent to follow-up appointment? No  Outreach attempt # 1 to patient and HIPAA verified. Patient has a scheduled appointment on 01/31/17 with Dr. Wynn Erickson. Dr. Wynn Erickson will continue to follow patient with her medical care. Patient's has an appointment with the Neurologist on 02/22/17, per EMR. She reported receiving her hospital discharge paperwork. She read and understood her discharge paperwork.  She stated, she was able to afford her discharge medications. She is taking her medications as prescribed by the doctor including Aspirin and Plavix. Patient was able to voice signs and symptoms of a stroke. She verbalized being aware of the acronym FAST. She was given a magnet with signs and symptoms of a stroke by the home health PT. She is active with [facility] (PT/OT/ST). Patient was appreciative of the phone call.   Plan: RN CM will notify [facility] CM administrative assistant regarding case closure.   Wynelle ClevelandJuanita Kden Wagster, RN, BSN, MHA/MSL, [facility] Telephonic Care Manager Coordinator [facility] Direct Phone: [phone] Toll Free: [phone] Fax: [phone]

## 2017-01-31 ENCOUNTER — Encounter: Payer: Medicaid Other | Attending: Physical Medicine & Rehabilitation

## 2017-01-31 ENCOUNTER — Encounter: Payer: Self-pay | Admitting: Physical Medicine & Rehabilitation

## 2017-01-31 ENCOUNTER — Ambulatory Visit (HOSPITAL_BASED_OUTPATIENT_CLINIC_OR_DEPARTMENT_OTHER): Payer: Self-pay | Admitting: Physical Medicine & Rehabilitation

## 2017-01-31 VITALS — BP 118/86 | HR 88 | Resp 16

## 2017-01-31 DIAGNOSIS — I63511 Cerebral infarction due to unspecified occlusion or stenosis of right middle cerebral artery: Secondary | ICD-10-CM | POA: Diagnosis not present

## 2017-01-31 DIAGNOSIS — I69354 Hemiplegia and hemiparesis following cerebral infarction affecting left non-dominant side: Secondary | ICD-10-CM | POA: Diagnosis not present

## 2017-01-31 DIAGNOSIS — E041 Nontoxic single thyroid nodule: Secondary | ICD-10-CM | POA: Diagnosis not present

## 2017-01-31 MED ORDER — ATORVASTATIN CALCIUM 80 MG PO TABS: 80.0000 mg | ORAL_TABLET | Freq: Every day | ORAL | 0 refills | Status: DC

## 2017-01-31 MED ORDER — TOPIRAMATE 25 MG PO TABS: 25.0000 mg | ORAL_TABLET | Freq: Every day | ORAL | 0 refills | Status: DC

## 2017-01-31 MED ORDER — TIZANIDINE HCL 2 MG PO TABS: 6.0000 mg | ORAL_TABLET | Freq: Every day | ORAL | 0 refills | Status: DC

## 2017-01-31 MED ORDER — CLOPIDOGREL BISULFATE 75 MG PO TABS: 75.0000 mg | ORAL_TABLET | Freq: Every day | ORAL | 0 refills | Status: DC

## 2017-01-31 MED ORDER — TIZANIDINE HCL 2 MG PO TABS: 2.0000 mg | ORAL_TABLET | Freq: Two times a day (BID) | ORAL | 0 refills | Status: DC

## 2017-01-31 NOTE — Progress Notes (Signed)
Subjective:    Patient ID: Shannon Erickson, female    DOB: 07/05/1975, 41 y.o.   MRN: [id number] 41 year old female with history of meningitis, migraines who was admitted on 12/03/16 with severe right frontoparietal HA, slurred speech, lethargy and left sided weakness. CT head negative. MRI brain done revealing "Large RIGHT MCA territory nonhemorrhagic infarct involving temporal lobe, frontal lobe, insula, basal ganglia and regional white matter with early mass effect and 1-2 mm. EEG done showing right hemispheric slowing with diffuse cerebral dysfunction due to encephalopathy.  BLE dopplers were negative for DVT. TEE done revealing EF 50% with no thrombus, no PFO or ASD.  CTA neck showed focal irregularity at right carotid bifurcation with self like plaque and 11 mm thyroid nodule. Stroke felt to be embolic from R-ICA plaque and Dr. Roda ShuttersXu recommended ASA/Plavix X 3 months followed by Plavix alone. Follow up CT head 7/19 stable and lethargy resolving with improvement in headaches.  Coagulopathy panel with high beta 2 glycoprotein IgG and will need repeat test in 12 weeks due question of antiphospholipid syndrome.  Patient with resultant left sided weakness, left inattention, delayed processing with cognitive deficits and dysphagia. CIR recommended by rehab team.  Admit date: 12/10/2016 Discharge date: 01/14/2017  HPI Needs assist with bathing and dressing  Pain Inventory Average Pain 0 Pain Right Now 0 My pain is no pain  In the last 24 hours, has pain interfered with the following? General activity 0 Relation with others 0 Enjoyment of life 0 What TIME of day is your pain at its worst? no pain Sleep (in general) Fair  Pain is worse with: inactivity Pain improves with: therapy/exercise Relief from Meds: 2  Mobility walk with assistance use a walker how many minutes can you walk? 10 ability to climb steps?  yes do you drive?  no use a wheelchair needs help with transfers Do you have  any goals in this area?  yes  Function disabled: date disabled . I need assistance with the following:  feeding, dressing, bathing, toileting, meal prep, household duties and shopping  Neuro/Psych No problems in this area  Prior Studies Any changes since last visit?  no  Physicians involved in your care [facility] Dr Roda ShuttersXu   Family History  Problem Relation Age of Onset  . Lung cancer Mother        mets to brain, bone, liver  . Hypertension Father   . Heart attack Brother        at age 41  . Renal cancer Maternal Grandfather   . Heart attack Paternal Grandfather    Social History   Social History  . Marital status: Single    Spouse name: N/A  . Number of children: 3  . Years of education: 6712   Social History Main Topics  . Smoking status: Never Smoker  . Smokeless tobacco: Never Used  . Alcohol use No  . Drug use: No  . Sexual activity: Not Asked   Other Topics Concern  . None   Social History Narrative  . None   Past Surgical History:  Procedure Laterality Date  . CESAREAN SECTION Bilateral 1995, 2002  . NO PAST SURGERIES    . TEE WITHOUT CARDIOVERSION N/A 12/05/2016   Procedure: TRANSESOPHAGEAL ECHOCARDIOGRAM (TEE);  Surgeon: Elease HashimotoNahser, Deloris PingPhilip J, MD;  [facility];  Service: Cardiovascular;  Laterality: N/A;   Past Medical History:  Diagnosis Date  . Lesion of left ulnar nerve   .  Meningitis 1995  . Migraine    BP 118/86 (BP Location: Right Arm, Patient Position: Sitting, Cuff Size: Normal)   Pulse 88   Resp 16   SpO2 96%   Opioid Risk Score:   Fall Risk Score:  `1  Depression screen PHQ 2/9  No flowsheet data found.  Review of Systems  Constitutional: Negative.   HENT: Negative.   Eyes: Negative.   Respiratory: Negative.   Cardiovascular: Negative.   Gastrointestinal: Negative.   Endocrine: Negative.   Genitourinary: Negative.   Musculoskeletal: Positive for gait problem.    Skin: Negative.   Allergic/Immunologic: Negative.   Hematological: Negative.   Psychiatric/Behavioral: Negative.   All other systems reviewed and are negative.      Objective:   Physical Exam  Constitutional: She appears well-developed and well-nourished.  HENT:  Head: Normocephalic and atraumatic.  Eyes: Pupils are equal, round, and reactive to light. Conjunctivae and EOM are normal.  Neck: Normal range of motion. Neck supple.  Cardiovascular: Normal rate, regular rhythm and normal heart sounds.   Pulmonary/Chest: Breath sounds normal. No respiratory distress. She has no wheezes.  Abdominal: Soft. Bowel sounds are normal. She exhibits no distension. There is no tenderness.  Skin: Skin is warm and dry.  Psychiatric: She has a normal mood and affect.  Nursing note and vitals reviewed.      Assessment & Plan:  1.  Right MCA distribution infarct  Plan Botox for pectoralis 100 units Left wrist pronator, teres, 25 units Left wrist pronator quadratus, 25 units Left FCR 50 units  Have refilled her medications with exception of tramadol But patient will follow up with the [facility] clinic in [place] for further refills of her medications.  I reviewed her prior imaging studies, there wasn't an incidental finding of left thyroid nodule. 11 mm with recommendation for outpatient thyroid ultrasound Have ordered

## 2017-02-08 ENCOUNTER — Other Ambulatory Visit: Payer: Self-pay | Admitting: Physical Medicine & Rehabilitation

## 2017-02-10 ENCOUNTER — Ambulatory Visit
Admission: RE | Admit: 2017-02-10 | Discharge: 2017-02-10 | Disposition: A | Discharge status: Home / Self Care | Payer: Medicaid Other | Source: Ambulatory Visit | Attending: Physical Medicine & Rehabilitation | Admitting: Physical Medicine & Rehabilitation

## 2017-02-10 DIAGNOSIS — E041 Nontoxic single thyroid nodule: Secondary | ICD-10-CM

## 2017-02-16 ENCOUNTER — Telehealth: Payer: Self-pay | Admitting: Physical Medicine & Rehabilitation

## 2017-02-16 NOTE — Telephone Encounter (Signed)
She wants to see him before her 02/28/17 appt if possible.  Please call and reschedule.

## 2017-02-16 NOTE — Telephone Encounter (Signed)
Last time I saw pt she was not ready for return to work, I need to see her again to re eval

## 2017-02-16 NOTE — Telephone Encounter (Signed)
Patient would like for [place] to fax this letter to her employer at [phone].

## 2017-02-16 NOTE — Telephone Encounter (Signed)
Pt phoned stated she wants to go back to work. Pt stated she has currently completed all therapy sessions. Pt stated her employer requires a note releasing her back to work. 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 Please advise.

## 2017-02-17 NOTE — Telephone Encounter (Signed)
Patient was rescheduled for a 30 minute combination botox/employment eval visit on Monday October 1st, 2018, at 10 AM

## 2017-02-20 ENCOUNTER — Ambulatory Visit (HOSPITAL_BASED_OUTPATIENT_CLINIC_OR_DEPARTMENT_OTHER): Payer: Medicaid Other | Admitting: Physical Medicine & Rehabilitation

## 2017-02-20 ENCOUNTER — Encounter: Payer: Self-pay | Admitting: Physical Medicine & Rehabilitation

## 2017-02-20 ENCOUNTER — Encounter: Payer: Medicaid Other | Attending: Physical Medicine & Rehabilitation

## 2017-02-20 VITALS — BP 115/77 | HR 88

## 2017-02-20 DIAGNOSIS — I69354 Hemiplegia and hemiparesis following cerebral infarction affecting left non-dominant side: Secondary | ICD-10-CM | POA: Diagnosis not present

## 2017-02-20 DIAGNOSIS — I69319 Unspecified symptoms and signs involving cognitive functions following cerebral infarction: Secondary | ICD-10-CM | POA: Insufficient documentation

## 2017-02-20 DIAGNOSIS — R269 Unspecified abnormalities of gait and mobility: Secondary | ICD-10-CM

## 2017-02-20 DIAGNOSIS — E041 Nontoxic single thyroid nodule: Secondary | ICD-10-CM | POA: Insufficient documentation

## 2017-02-20 DIAGNOSIS — I63511 Cerebral infarction due to unspecified occlusion or stenosis of right middle cerebral artery: Secondary | ICD-10-CM | POA: Diagnosis not present

## 2017-02-20 DIAGNOSIS — I69398 Other sequelae of cerebral infarction: Secondary | ICD-10-CM | POA: Diagnosis not present

## 2017-02-20 MED ORDER — TIZANIDINE HCL 2 MG PO TABS: ORAL_TABLET | ORAL | 0 refills | Status: DC

## 2017-02-20 MED ORDER — TIZANIDINE HCL 2 MG PO TABS: 2.0000 mg | ORAL_TABLET | Freq: Three times a day (TID) | ORAL | 0 refills | Status: DC

## 2017-02-20 NOTE — Progress Notes (Signed)
Subjective:    Patient ID: Shannon Erickson, female    DOB: 1976/01/02, 41 y.o.   MRN: [id number]  HPI  Right MCA infarct with left spastic hemiplegia. Onset of stroke was 12/03/2016. She is completed inpatient rehabilitation and has completed. Home health therapy Patient needs help with putting on her left shoe, she also needs help with tub transfers. Patient can get in and out of a car. Ambulates with a hemiwalker. Patient still requires supervision for ambulation No functional uses of the left upper extremity Starting to do some cooking.  Pain Inventory Average Pain 0 Pain Right Now 0 My pain is na  In the last 24 hours, has pain interfered with the following? General activity 0 Relation with others 0 Enjoyment of life 0 What TIME of day is your pain at its worst? na Sleep (in general) Fair  Pain is worse with: bending Pain improves with: rest, heat/ice and pacing activities Relief from Meds: 0  Mobility ability to climb steps?  yes do you drive?  no use a wheelchair needs help with transfers  Function I need assistance with the following:  dressing, bathing, meal prep, household duties and shopping  Neuro/Psych spasms  Prior Studies Any changes since last visit?  no  Physicians involved in your care Any changes since last visit?  no   Family History  Problem Relation Age of Onset  . Lung cancer Mother        mets to brain, bone, liver  . Hypertension Father   . Heart attack Brother        at age 24  . Renal cancer Maternal Grandfather   . Heart attack Paternal Grandfather    Social History   Social History  . Marital status: Single    Spouse name: N/A  . Number of children: 3  . Years of education: 38   Social History Main Topics  . Smoking status: Never Smoker  . Smokeless tobacco: Never Used  . Alcohol use No  . Drug use: No  . Sexual activity: Not on file   Other Topics Concern  . Not on file   Social History Narrative  . No  narrative on file   Past Surgical History:  Procedure Laterality Date  . CESAREAN SECTION Bilateral 1995, 2002  . NO PAST SURGERIES    . TEE WITHOUT CARDIOVERSION N/A 12/05/2016   Procedure: TRANSESOPHAGEAL ECHOCARDIOGRAM (TEE);  Surgeon: Elease Hashimoto Deloris Ping, MD;  Location: [facility] ENDOSCOPY;  Service: Cardiovascular;  Laterality: N/A;   Past Medical History:  Diagnosis Date  . Lesion of left ulnar nerve   . Meningitis 1995  . Migraine    There were no vitals taken for this visit.  Opioid Risk Score:   Fall Risk Score:  `1  Depression screen PHQ 2/9  No flowsheet data found.   Review of Systems  Constitutional: Negative.   HENT: Negative.   Eyes: Negative.   Respiratory: Negative.   Cardiovascular: Negative.   Gastrointestinal: Negative.   Endocrine: Negative.   Genitourinary: Negative.   Musculoskeletal: Negative.   Skin: Negative.   Allergic/Immunologic: Negative.   Neurological: Negative.   Hematological: Negative.   Psychiatric/Behavioral: Negative.   All other systems reviewed and are negative.      Objective:   Physical Exam  Oriented to person, generally to place, [place], but not Street, oriented to day, month and year, Able to do serial sevens test 2 places with increased time taken for calculation. Difficulty repeating [facility] remembers, 2  out of 3 objects after 3 minute delay  Motor strength is 5/5 in the right deltoid, bicep, tricep, grip, hip flexor, knee extensor, ankle dorsi flexor.  1/5 in the left deltoid, bicep, finger flexors, 0 at the finger extensors and arm extensors 3 minus at the left hip flexor and knee extensor and 0 at the ankle dorsiflexor and plantar flexor Absent light touch left upper extremity, partial sensation. Left lower extremity.       Assessment & Plan:   1. Right MCA inch infarct with residual left hemiparesis, left hemisensory deficits and decreased cognition She is making  Good recovery. However, she still  requires some assistance for basic ADLs and mobility.She uses a wheelchair for longer distances Patient also has some mild to moderate memory, attention concentration and nformation processing deficitsperiod For this reason, I do not think she is ready to return to work or to driving I have made a referral to outpatient OT, PTand speech, which will be done at [place] due to proximity

## 2017-02-22 ENCOUNTER — Encounter: Payer: Self-pay | Admitting: Neurology

## 2017-02-22 ENCOUNTER — Ambulatory Visit (INDEPENDENT_AMBULATORY_CARE_PROVIDER_SITE_OTHER): Payer: Medicaid Other | Admitting: Neurology

## 2017-02-22 VITALS — BP 121/82 | HR 98 | Ht 63.0 in | Wt 183.0 lb

## 2017-02-22 DIAGNOSIS — G43709 Chronic migraine without aura, not intractable, without status migrainosus: Secondary | ICD-10-CM | POA: Diagnosis not present

## 2017-02-22 DIAGNOSIS — E785 Hyperlipidemia, unspecified: Secondary | ICD-10-CM | POA: Diagnosis not present

## 2017-02-22 DIAGNOSIS — I63511 Cerebral infarction due to unspecified occlusion or stenosis of right middle cerebral artery: Secondary | ICD-10-CM

## 2017-02-22 DIAGNOSIS — G8114 Spastic hemiplegia affecting left nondominant side: Secondary | ICD-10-CM

## 2017-02-22 NOTE — Progress Notes (Signed)
STROKE NEUROLOGY FOLLOW UP NOTE  NAME: Shannon Erickson DOB: 08-Jan-1976  REASON FOR VISIT: stroke follow up HISTORY FROM: pt and son and chart  Today we had the pleasure of seeing Shannon Erickson in follow-up at our Neurology Clinic. Pt was accompanied by son.   History Summary [name] is a 41 y.o. female with history of migraine headaches was admitted on 7/14/8 for left-sided weakness. she was healthy PTA. She was found to have large right MCA infarct on MRI. MRA right M1 occlusion. CTA head and neck proximal right M1 occlusion, b/l ICA bifurcation soft plaques concerning for ulceration or intraplaque hemorrhage. TCD bubble study no PFO and emboli detection no MES. EF 60-65%. TEE unremarkable EF 50%. DVT negative. LDL 138 and A1C 6.2. Hypercoagulable work up showed beta-2-glycoprotein IgG 109, very high. She was put on DAPT and high dose lipitor. She was discharge to CIR with LUE 0/5 and LLE 2/5. Recommend to repeat beta-2 glycoprotein in 3 months.   Interval History During the interval time, the patient has been doing better. Currently on outpt PT/OT. LLE improved much, able to walk with walker at home, but LLE still minimal movement. Able to work at home now and she likes to back to work.   Of note, she stated that her son who accompanied with him had PE 2 years ago and was diagnosed with APS, was put on coumadin but difficulty with INR, now on Xarelto. Pt denies any other FHx of APS. She also denies any DVT, PE or miscarriage.   REVIEW OF SYSTEMS: Full 14 system review of systems performed and notable only for those listed below and in HPI above, all others are negative:  Constitutional:  fatigue Cardiovascular: swelling in legs Ear/Nose/Throat:  Hearing loss Skin:  Eyes:   Respiratory:   Gastroitestinal:   Genitourinary: urination problems, incontinence Hematology/Lymphatic:  Easy bruising  Endocrine:  Musculoskeletal:  Joint pain, joint swelling, cramp, aching  muscles Allergy/Immunology:   Neurological:  Memory loss, numbness, weakness Psychiatric: not enough sleep, decreased energy, change in appetite Sleep: sleepiness, restless leg  The following represents the patient's updated allergies and side effects list: Allergies  Allergen Reactions  . Penicillins Hives    Has patient had a PCN reaction causing immediate rash, facial/tongue/throat swelling, SOB or lightheadedness with hypotension: Yes Has patient had a PCN reaction causing severe rash involving mucus membranes or skin necrosis: Yes Has patient had a PCN reaction that required hospitalization: Unk Has patient had a PCN reaction occurring within the last 10 years: No If all of the above answers are "NO", then may proceed with Cephalosporin use.     The neurologically relevant items on the patient's problem list were reviewed on today's visit.  Neurologic Examination  A problem focused neurological exam (12 or more points of the single system neurologic examination, vital signs counts as 1 point, cranial nerves count for 8 points) was performed.  Blood pressure 121/82, pulse 98, height  (1.6 m), weight 183 lb (83 kg).  General - Well nourished, well developed, in no apparent distress.  Ophthalmologic - Sharp disc margins OU.   Cardiovascular - Regular rate and rhythm with no murmur.  Mental Status -  Level of arousal and orientation to time, place, and person were intact. Language including expression, naming, repetition, comprehension was assessed and found intact. Attention span and concentration were normal. Fund of Knowledge was assessed and was intact.  Cranial Nerves II - XII - II - Visual field intact  OU. III, IV, VI - Extraocular movements intact. V - Facial sensation intact bilaterally. VII - Left nasolabial fold flattening. VIII - Hearing & vestibular intact bilaterally. X - Palate elevates symmetrically. XI - Chin turning & shoulder shrug intact  bilaterally. XII - Tongue protrusion intact.  Motor Strength - The patient's strength was normal in RUE and RLE, however, LUE proximal 2/5 and distally 0/5, LLE 4/5 with PFO brace.  Bulk was normal and fasciculations were absent.   Motor Tone - Muscle tone was assessed at the neck and appendages and was increased on the left UE and LE.  Reflexes - The patient's reflexes were 3+ in LUE and LLE and she had no pathological reflexes.  Sensory - Light touch, temperature/pinprick were assessed and were normal.    Coordination - The patient had normal movements in the right hand with no ataxia or dysmetria.  Tremor was absent.  Gait and Station - wheelchair today, gait not tested   Functional score  mRS = 3   0 - No symptoms.   1 - No significant disability. Able to carry out all usual activities, despite some symptoms.   2 - Slight disability. Able to look after own affairs without assistance, but unable to carry out all previous activities.   3 - Moderate disability. Requires some help, but able to walk unassisted.   4 - Moderately severe disability. Unable to attend to own bodily needs without assistance, and unable to walk unassisted.   5 - Severe disability. Requires constant nursing care and attention, bedridden, incontinent.   6 - Dead.   NIH Stroke Scale   Level Of Consciousness 0=Alert; keenly responsive 1=Not alert, but arousable by minor stimulation 2=Not alert, requires repeated stimulation 3=Responds only with reflex movements 0  LOC Questions to Month and Age 47=Answers both questions correctly 1=Answers one question correctly 2=Answers neither question correctly 0  LOC Commands      -Open/Close eyes     -Open/close grip 0=Performs both tasks correctly 1=Performs one task correctly 2=Performs neighter task correctly 0  Best Gaze 0=Normal 1=Partial gaze palsy 2=Forced deviation, or total gaze paresis 0  Visual 0=No visual loss 1=Partial hemianopia 2=Complete  hemianopia 3=Bilateral hemianopia (blind including cortical blindness) 0  Facial Palsy 0=Normal symmetrical movement 1=Minor paralysis (asymmetry) 2=Partial paralysis (lower face) 3=Complete paralysis (upper and lower face) 1  Motor  0=No drift, limb holds posture for full 10 seconds 1=Drift, limb holds posture, no drift to bed 2=Some antigravity effort, cannot maintain posture, drifts to bed 3=No effort against gravity, limb falls 4=No movement Right Arm 0     Leg 0    Left Arm 2     Leg 0  Limb Ataxia 0=Absent 1=Present in one limb 2=Present in two limbs 0  Sensory 0=Normal 1=Mild to moderate sensory loss 2=Severe to total sensory loss 0  Best Language 0=No aphasia, normal 1=Mild to moderate aphasia 2=Mute, global aphasia 3=Mute, global aphasia 0  Dysarthria 0=Normal 1=Mild to moderate 2=Severe, unintelligible or mute/anarthric 0  Extinction/Neglect 0=No abnormality 1=Extinction to bilateral simultaneous stimulation 2=Profound neglect 0  Total   3     Data reviewed: I personally reviewed the images and agree with the radiology interpretations.  Ct Head Code Stroke W/o Cm 12/03/2016 1. Negative mildly motion degraded noncontrast CT HEAD.  2. ASPECTS is 10.  Ct Angio Head W Or Wo Contrast Ct Angio Neck W Or Wo Contrast 12/03/2016 1. Proximal right M1 conclusion.  2. Expected evolution of right temporal lobe,  insular cortex, and basal ganglia nonhemorrhagic infarction.  3. Focal irregularity at the right carotid bifurcation with a shelf-like soft tissue plaque but no significant stenosis relative to the more distal vessel.  4. More mild atherosclerotic changes at the left carotid bifurcation.  5. Fetal type posterior cerebral artery is bilaterally.  6. 11 mm thyroid nodule. Consider further evaluation with thyroid ultrasound. If patient is clinically hyperthyroid, consider nuclear medicine thyroid uptake and scan. Thyroid ultrasound can be done following discharge.     Mr Brain Wo Contrast 12/03/2016 Motion degraded exam.  Large RIGHT MCA territory nonhemorrhagic infarct as described.  There is early mass effect RIGHT-to-LEFT of 1-2 mm. Close follow-up recommended.  Within limits for detection on this motion degraded exam, there is no gross hemorrhage, although small areas could be overlooked. Consider CT follow-up. Suspected RIGHT MCA M1 stenosis or occlusion. This could be further assessed with CT angiography of the head and neck, but only after the patient is more cooperative.      CT Head  12/06/2016 1. Evolving acute large RIGHT MCA territory infarct without hemorrhagic conversion. 2. Increasing 5 mm RIGHT to LEFT midline shift without ventricular entrapment. 3. Dense RIGHT MCA consistent with thromboembolism.  CT Head  12/08/2016 1. Unchanged appearance of right MCA territory infarct with 6 mm of leftward midline shift. No hemorrhagic conversion. 2. No ventricular entrapment or hydrocephalus.  LE venous Doppler pending 12/05/2016 Mild technical difficulty due to size of the veins.   - No evidence of deep vein or superficial thrombosis involving the right lower extremity and left lower extremity  TCD bubble study - no PFO at rest  TCD emboli detection - negative  Ct Head Wo Contrast 12/06/2016 IMPRESSION: 1. Evolving acute large RIGHT MCA territory infarct without hemorrhagic conversion. 2. Increasing 5 mm RIGHT to LEFT midline shift without ventricular entrapment. 3. Dense RIGHT MCA consistent with thromboembolism.   TTE - Left ventricle: The cavity size was normal. Systolic function was normal. The estimated ejection fraction was in the range of 60% to 65%. Wall motion was normal; there were no regional wall motion abnormalities. Left ventricular diastolic function parameters were normal. - Mitral valve: There was trivial regurgitation. - Pulmonic valve: There was no regurgitation.  TEE  Left Ventrical:Low normal LV  function. EF 50% Mitral Valve:normal  Aortic Valve:normal  Tricuspid Valve: normal  Pulmonic Valve: normal  Left Atrium/ Left atrial appendage:no thrombi  Atrial septum:no PFO or ASD by color flow or Doppler  Aorta:normal   EEG  1. Focal structural or other physiologic abnormality in the right hemisphere, Consistent with location of stroke. 2. diffuse cerebral dysfunction that is non-specific in etiology and can be seen with hypoxic/ischemic injury, toxic/metabolic encephalopathies, neurodegenerative disorders, or medication effect.   Ct Head Wo Contrast 12/08/2016 IMPRESSION: 1. Unchanged appearance of right MCA territory infarct with 6 mm of leftward midline shift. No hemorrhagic conversion. 2. No ventricular entrapment or hydrocephalus. Imaging by my read, no significant change of midline shift from last CT.  Component     Latest Ref Rng & Units 12/03/2016 12/04/2016 12/05/2016 12/16/2016  Cholesterol     0 - 200 mg/dL  119    Triglycerides     <150 mg/dL  70    HDL Cholesterol     >40 mg/dL  41    Total CHOL/HDL Ratio     RATIO  4.7    VLDL     0 - 40 mg/dL  14    LDL (calc)  0 - 99 mg/dL  161 (H)    Alpha galactosidase, serum     28.0 - 80.0 nmol/hr/mg prt   13.2 (L)   Interpretation        Comment   Director Review        Comment   Methodology        Comment   PTT Lupus Anticoagulant     0.0 - 51.9 sec  33.4    DRVVT     0.0 - 47.0 sec  40.8    Lupus Anticoag Interp       Comment:    Beta-2 Glycoprotein I Ab, IgG     0 - 20 GPI IgG units  109 (H)    Beta-2-Glycoprotein I IgM     0 - 32 GPI IgM units  <9    Beta-2-Glycoprotein I IgA     0 - 25 GPI IgA units  16    Anticardiolipin Ab,IgG,Qn     0 - 14 GPL U/mL  <9    Anticardiolipin Ab,IgM,Qn     0 - 12 MPL U/mL  13 (H)    Anticardiolipin Ab,IgA,Qn     0 - 11 APL U/mL  <9    Hemoglobin A1C     4.8 - 5.6 % 6.0 (H) 6.2 (H)    Mean Plasma Glucose     mg/dL 096 045    HIV Screen 4th Generation  wRfx     Non Reactive Non Reactive     Vitamin B12     180 - 914 pg/mL 275     RPR     Non Reactive Non Reactive     TSH     0.350 - 4.500 uIU/mL 1.119     Sed Rate     0 - 22 mm/hr 13     Antithrombin Activity     75 - 120 %  113    Protein C-Functional     73 - 180 %  191 (H)    Protein C, Total     60 - 150 %  162 (H)    Protein S-Functional     63 - 140 %  91    Protein S, Total     60 - 150 %  95    Homocysteine     0.0 - 15.0 umol/L  10.3    Recommendations-F5LEID:       Comment    Recommendations-PTGENE:       Comment    ds DNA Ab     0 - 9 IU/mL  <1    ANA Ab, IFA       TNPOLD  Negative    Assessment: As you may recall, she is a 41 y.o. Caucasian female with PMH of migraine headaches was admitted on 7/14/8 for left-sided weakness. Found to have large right MCA infarct on MRI. MRA right M1 occlusion. CTA head and neck proximal right M1 occlusion, b/l ICA bifurcation soft plaques concerning for ulceration or intraplaque hemorrhage. TCD bubble study no PFO and emboli detection no MES. EF 60-65%. TEE unremarkable EF 50%. DVT negative. LDL 138 and A1C 6.2. Hypercoagulable work up showed beta-2-glycoprotein IgG 109, high. She was put on DAPT and high dose lipitor. She was discharge to CIR. During the interval time, the patient has been doing better, left side strength improved, able to walk with semi-walker. On outpt PT/OT. Able to work at home now and she likes to back to work. Will repeat  beta-2-glycoprotein IgG.  Of note, her son who accompanied with her today had PE 2 years ago and was diagnosed with APS, was put on coumadin but difficulty with INR, now on Xarelto. Pt denies any other FHx of APS. She also denies any DVT, PE or miscarriage.   Plan:  - continue ASA and plavix and lipitor for stroke prevention - will repeat blood test to rule in or rule out antiphospholipid syndrome - if it is confirmed, we need to start coumadin for stroke prevention - OK to back to work  but part time for 1-2 weeks before full workload - Follow up with your primary care physician for stroke risk factor modification. Recommend maintain blood pressure goal <130/80, diabetes with hemoglobin A1c goal below 7.0% and lipids with LDL cholesterol goal below 70 mg/dL.  - healthy diet and regular exercise - continue outpt PT/OT to improve strength, continue follow up with Dr. Wynn Banker - follow up in 3 months.   I spent more than 25 minutes of face to face time with the patient. Greater than 50% of time was spent in counseling and coordination of care. We discussed about APS treatment and diagnosis, continue PT/OT.    Orders Placed This Encounter  Procedures  . Beta-2 Glycoprotein I Ab,G/M  . Factor 8 assay  . Cardiolipin antibodies, IgM+IgG  . Lupus anticoagulant    No orders of the defined types were placed in this encounter.   Patient Instructions  - continue ASA and plavix and lipitor for stroke prevention - will repeat blood test to rule in or rule out antiphospholipid syndrome - if it is confirmed, we need to start coumadin for stroke prevention - OK to back to work but part time for 1-2 weeks before full workload - Follow up with your primary care physician for stroke risk factor modification. Recommend maintain blood pressure goal <130/80, diabetes with hemoglobin A1c goal below 7.0% and lipids with LDL cholesterol goal below 70 mg/dL.  - healthy diet and regular exercise - continue outpt PT/OT to improve strength - follow up in 3 months.    Marvel Plan, MD PhD [facility] [address], [address] [place], [place] [address] [phone]

## 2017-02-22 NOTE — Patient Instructions (Signed)
-   continue ASA and plavix and lipitor for stroke prevention - will repeat blood test to rule in or rule out antiphospholipid syndrome - if it is confirmed, we need to start coumadin for stroke prevention - OK to back to work but part time for 1-2 weeks before full workload - Follow up with your primary care physician for stroke risk factor modification. Recommend maintain blood pressure goal <130/80, diabetes with hemoglobin A1c goal below 7.0% and lipids with LDL cholesterol goal below 70 mg/dL.  - healthy diet and regular exercise - continue outpt PT/OT to improve strength - follow up in 3 months.

## 2017-02-25 LAB — BETA-2 GLYCOPROTEIN I AB,G/M

## 2017-02-25 LAB — LUPUS ANTICOAGULANT
Dilute Viper Venom Time: 32 s (ref 0.0–47.0)
PTT Lupus Anticoagulant: 33 s (ref 0.0–51.9)
Thrombin Time: 15.5 s (ref 0.0–23.0)
dPT Confirm Ratio: 0.87 Ratio (ref 0.00–1.40)
dPT: 37.8 s (ref 0.0–55.0)

## 2017-02-25 LAB — FACTOR 8 ASSAY: FACTOR VIII ACTIVITY: 241 % — AB (ref 57–163)

## 2017-02-25 LAB — CARDIOLIPIN ANTIBODIES, IGM+IGG
ANTICARDIOLIPIN IGG: 18 GPL U/mL — AB (ref 0–14)
Anticardiolipin IgM: 14 MPL U/mL — ABNORMAL HIGH (ref 0–12)

## 2017-02-28 ENCOUNTER — Ambulatory Visit: Payer: Self-pay | Admitting: Physical Medicine & Rehabilitation

## 2017-03-07 ENCOUNTER — Telehealth: Payer: Self-pay | Admitting: Neurology

## 2017-03-07 DIAGNOSIS — D6861 Antiphospholipid syndrome: Secondary | ICD-10-CM

## 2017-03-07 MED ORDER — WARFARIN SODIUM 5 MG PO TABS: 5.0000 mg | ORAL_TABLET | Freq: Every day | ORAL | 1 refills | Status: DC

## 2017-03-07 NOTE — Telephone Encounter (Signed)
Patient blood test results came back showed again beta-2 glycoprotein IgG > 150, factor VIII activity 241, cardiolipin IgG 18, cardiolipin IgM 14. Taken together with her test 3 months ago showing beta-2 glycoprotein IgG 109, and cardiolipin IgM 13, diagnosis of antiphospholipid syndrome established. Patient's son also has antiphospholipid syndrome diagnosed, was on Coumadin before however difficulty with INR management, now changed to Xarelto. Therefore, she may have inherited antiphospholipid syndrome running in families.  She needs to have Coumadin for anticoagulation for stroke and thrombosis prevention. Will need to start Coumadin, and INR goal 2.5-3.5. Continue aspirin and Plavix for now until INR therapeutic (likely 3-5 days). I have ordered Coumadin to her pharmacy, and she is aware and plan to pick up in the morning.  I will also forward this note to her PCP Dr. Esmond Plants in [facility] in [place]. I encouraged her to call Dr. Marina Goodell in the morning to discuss about further INR monitoring in the office. She expressed understanding and appreciation.  Marvel Plan, MD PhD Stroke Neurology 03/07/2017 5:54 PM

## 2017-03-25 ENCOUNTER — Other Ambulatory Visit: Payer: Self-pay | Admitting: Physical Medicine & Rehabilitation

## 2017-03-27 NOTE — Telephone Encounter (Signed)
Recieved electronic medication refill request for tizanidine, on previous note on 02-20-2017, it did not state to continue to use any medications, also on note from 01-31-2017, it was stated that:  Have refilled her medications with exception of tramadol But patient will follow up with the [facility] in [place] for further refills of her medications.  Not sure to refill this medication, please advise

## 2017-04-03 ENCOUNTER — Ambulatory Visit: Payer: Medicaid Other | Admitting: Physical Medicine & Rehabilitation

## 2017-04-03 ENCOUNTER — Encounter: Payer: Medicaid Other | Attending: Physical Medicine & Rehabilitation

## 2017-04-03 DIAGNOSIS — I69354 Hemiplegia and hemiparesis following cerebral infarction affecting left non-dominant side: Secondary | ICD-10-CM | POA: Insufficient documentation

## 2017-04-03 DIAGNOSIS — I63511 Cerebral infarction due to unspecified occlusion or stenosis of right middle cerebral artery: Secondary | ICD-10-CM | POA: Insufficient documentation

## 2017-04-03 DIAGNOSIS — E041 Nontoxic single thyroid nodule: Secondary | ICD-10-CM | POA: Insufficient documentation

## 2017-05-30 ENCOUNTER — Ambulatory Visit: Payer: Medicaid Other | Admitting: Neurology

## 2017-05-30 ENCOUNTER — Telehealth: Payer: Self-pay

## 2017-05-30 NOTE — Telephone Encounter (Signed)
Rn tried calling pt. !st attempt someone pick up phone and hung up. 2nd attempt, phone went to vm.  Rn left vm that per DR. Roda ShuttersXu he wants to make sure her PCP is managing her coumadin,and blood work. Rn left vm for patient to call back to give us the answer. Rn left [facility] number.

## 2017-05-30 NOTE — Telephone Encounter (Signed)
Patient no show for appt today. 

## 2017-05-31 ENCOUNTER — Encounter: Payer: Self-pay | Admitting: Neurology

## 2017-06-01 NOTE — Telephone Encounter (Signed)
2nd attempt to reach patient. Left vm for patient to call back. Vm left for patient per Dr. Roda ShuttersXu. Dr.Xu wants to know who is managing her coumadin, and lab work for the medication.Also who is patients pcp. All we need is the MD name, and number who is managing her coumadin blood work, and medication dosage.

## 2017-06-01 NOTE — Telephone Encounter (Signed)
If patient calls back, just please document and ask her  1. Which MD is managing her coumadin(warfain) dosages. 2. ALso who is managing her PT, and INR. 3. We need the full name. Please document and send to Dr. Roda ShuttersXu.

## 2017-06-06 NOTE — Telephone Encounter (Signed)
I also called the two numbers on file, but nobody picked up the phone and can not leave VM. Let's wait her to call back. Thanks.  Marvel PlanJindong Abrham Maslowski, MD PhD Stroke Neurology 06/06/2017 3:13 PM

## 2017-06-22 ENCOUNTER — Telehealth: Payer: Self-pay | Admitting: Neurology

## 2017-06-22 DIAGNOSIS — D6861 Antiphospholipid syndrome: Secondary | ICD-10-CM | POA: Diagnosis not present

## 2017-06-22 DIAGNOSIS — R569 Unspecified convulsions: Secondary | ICD-10-CM | POA: Diagnosis not present

## 2017-06-22 NOTE — Telephone Encounter (Signed)
Talked with daughter over the phone. Let her know that our PA Felicie MornDavid Smith has talked with Dr. Ilsa IhaSnyder about her mom's seizure. Will start keppra, will do MRI and if there is no stroke, she does not need to be transferred to [facility] and can be observed over there and discharged with seizure meds. She expressed understanding.   Daughter told me that her mom has memory loss and she must have forgotten the appointment with me 3 weeks ago. This time, daughter will make appointment with [facility] and will come with pt to see me for follow up. Daughter told me that pt still on coumadin and last check of INR was WNL.  ADDENDUM: I did looked at her MRI brain this time and it showed no acute stroke. 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 Will recommend to continue keppra and follow up in clinic. EEG at this time is optional as long as she is back to baseline.   Hi, Katrina, could you please call the patient and make an follow up appointment with me next available? Thanks.   Marvel PlanJindong Breeley Bischof, MD PhD Stroke Neurology 06/22/2017 2:50 PM

## 2017-06-22 NOTE — Telephone Encounter (Signed)
IF daughter call back Dr. Roda ShuttersXu only saw pt for Stroke.He has never treated pt for seizures. Once pt is discharge Dr..Xu handles strokes at [facility] when is on duty there. Also if they want pt transferred to [facility] they would need to speak with the neuro hospitalist  at [facility] is a outpatient clinic and does not handle transfers.    Rn call patients daughter [name] at [phone]. While on phone ti became disconnected.  Rn tried to call patients daughter twice and it kept going to vm.

## 2017-06-22 NOTE — Telephone Encounter (Addendum)
RN call Shannon Erickson pts daughter in law. She was left. The person stated they will have pts son to call the office  to schedule the next available appt with Dr. Roda ShuttersXu.

## 2017-06-22 NOTE — Telephone Encounter (Signed)
Pt daughter(on DPR) has called stating pt had 2 seziures this morning and daughter requested of EMS that pt be taken [facility] but [facility] daughter [facility] bcz she is a pt of Dr Roda ShuttersXu and she has other Dr's there also.  Pt daughter [facility] to see if Dr Roda ShuttersXu could be contacted to call [facility] about having pt transferred and was told by someone there that she needed to call [facility] back and it is our responsibility to reach him.  Please call pt daughter at [phone]

## 2017-06-23 DIAGNOSIS — R569 Unspecified convulsions: Secondary | ICD-10-CM

## 2017-06-23 DIAGNOSIS — D6861 Antiphospholipid syndrome: Secondary | ICD-10-CM

## 2017-06-24 ENCOUNTER — Emergency Department (HOSPITAL_COMMUNITY): Payer: Medicaid Other

## 2017-06-24 ENCOUNTER — Emergency Department (HOSPITAL_COMMUNITY)
Admission: EM | Admit: 2017-06-24 | Discharge: 2017-06-25 | Disposition: A | Discharge status: Home / Self Care | Payer: Medicaid Other | Attending: Emergency Medicine | Admitting: Emergency Medicine

## 2017-06-24 ENCOUNTER — Encounter (HOSPITAL_COMMUNITY): Payer: Self-pay | Admitting: Emergency Medicine

## 2017-06-24 DIAGNOSIS — I639 Cerebral infarction, unspecified: Secondary | ICD-10-CM | POA: Diagnosis not present

## 2017-06-24 DIAGNOSIS — Z7901 Long term (current) use of anticoagulants: Secondary | ICD-10-CM | POA: Insufficient documentation

## 2017-06-24 DIAGNOSIS — Z8673 Personal history of transient ischemic attack (TIA), and cerebral infarction without residual deficits: Secondary | ICD-10-CM | POA: Diagnosis not present

## 2017-06-24 DIAGNOSIS — Z7982 Long term (current) use of aspirin: Secondary | ICD-10-CM | POA: Diagnosis not present

## 2017-06-24 DIAGNOSIS — R569 Unspecified convulsions: Secondary | ICD-10-CM | POA: Diagnosis not present

## 2017-06-24 DIAGNOSIS — R5383 Other fatigue: Secondary | ICD-10-CM | POA: Diagnosis present

## 2017-06-24 DIAGNOSIS — Z7902 Long term (current) use of antithrombotics/antiplatelets: Secondary | ICD-10-CM | POA: Insufficient documentation

## 2017-06-24 DIAGNOSIS — Z79899 Other long term (current) drug therapy: Secondary | ICD-10-CM | POA: Diagnosis not present

## 2017-06-24 LAB — COMPREHENSIVE METABOLIC PANEL
ALK PHOS: 128 U/L — AB (ref 38–126)
ALT: 42 U/L (ref 14–54)
AST: 32 U/L (ref 15–41)
Albumin: 2.6 g/dL — ABNORMAL LOW (ref 3.5–5.0)
Anion gap: 9 (ref 5–15)
BILIRUBIN TOTAL: 0.8 mg/dL (ref 0.3–1.2)
BUN: 9 mg/dL (ref 6–20)
CALCIUM: 8.2 mg/dL — AB (ref 8.9–10.3)
CHLORIDE: 107 mmol/L (ref 101–111)
CO2: 23 mmol/L (ref 22–32)
CREATININE: 0.81 mg/dL (ref 0.44–1.00)
Glucose, Bld: 119 mg/dL — ABNORMAL HIGH (ref 65–99)
Potassium: 3.8 mmol/L (ref 3.5–5.1)
Sodium: 139 mmol/L (ref 135–145)
Total Protein: 6.6 g/dL (ref 6.5–8.1)

## 2017-06-24 LAB — I-STAT BETA HCG BLOOD, ED (MC, WL, AP ONLY)

## 2017-06-24 LAB — DIFFERENTIAL
BASOS ABS: 0 10*3/uL (ref 0.0–0.1)
Basophils Relative: 1 %
Eosinophils Absolute: 0.1 10*3/uL (ref 0.0–0.7)
Eosinophils Relative: 1 %
LYMPHS ABS: 2.4 10*3/uL (ref 0.7–4.0)
LYMPHS PCT: 37 %
MONO ABS: 0.5 10*3/uL (ref 0.1–1.0)
Monocytes Relative: 8 %
NEUTROS ABS: 3.4 10*3/uL (ref 1.7–7.7)
Neutrophils Relative %: 53 %

## 2017-06-24 LAB — CBC
HEMATOCRIT: 36.6 % (ref 36.0–46.0)
HEMOGLOBIN: 11.3 g/dL — AB (ref 12.0–15.0)
MCH: 24.1 pg — ABNORMAL LOW (ref 26.0–34.0)
MCHC: 30.9 g/dL (ref 30.0–36.0)
MCV: 78 fL (ref 78.0–100.0)
Platelets: 350 10*3/uL (ref 150–400)
RBC: 4.69 MIL/uL (ref 3.87–5.11)
RDW: 14.6 % (ref 11.5–15.5)
WBC: 6.4 10*3/uL (ref 4.0–10.5)

## 2017-06-24 LAB — I-STAT CHEM 8, ED
BUN: 9 mg/dL (ref 6–20)
CALCIUM ION: 1.09 mmol/L — AB (ref 1.15–1.40)
CREATININE: 0.8 mg/dL (ref 0.44–1.00)
Chloride: 104 mmol/L (ref 101–111)
GLUCOSE: 112 mg/dL — AB (ref 65–99)
HCT: 35 % — ABNORMAL LOW (ref 36.0–46.0)
HEMOGLOBIN: 11.9 g/dL — AB (ref 12.0–15.0)
Potassium: 3.8 mmol/L (ref 3.5–5.1)
Sodium: 141 mmol/L (ref 135–145)
TCO2: 24 mmol/L (ref 22–32)

## 2017-06-24 LAB — PROTIME-INR
INR: 2.04
Prothrombin Time: 22.8 seconds — ABNORMAL HIGH (ref 11.4–15.2)

## 2017-06-24 LAB — I-STAT TROPONIN, ED: TROPONIN I, POC: 0 ng/mL (ref 0.00–0.08)

## 2017-06-24 LAB — APTT: APTT: 45 s — AB (ref 24–36)

## 2017-06-24 MED ORDER — SODIUM CHLORIDE 0.9 % IV SOLN
500.0000 mg | Freq: Once | INTRAVENOUS | Status: AC
  Administered 2017-06-24: 500 mg via INTRAVENOUS
  Filled 2017-06-24: qty 5

## 2017-06-24 NOTE — ED Provider Notes (Signed)
Care assumed from Dr. Rennis Chris.  Please see full H&P.  In short,  Shannon Erickson is a 42 y.o. female presents with several generalized seizures 2 days ago and since that time has had several episodes of what appears to be absence seizures with postictal period afterwards.  Patient has a history of a stroke with residual left arm and left leg weakness and walks with a walker.  Patient's daughter-in-law is at bedside who is reporting associated fatigue.  Patient denies pain.    Physical Exam  BP 117/78   Pulse 98   Temp 98.6 F (37 C) (Oral)   Resp 16   Ht 5\' 3"  (1.6 m)   Wt 83 kg (183 lb)   SpO2 98%   BMI 32.42 kg/m   Physical Exam  Constitutional: She appears well-developed and well-nourished. No distress.  HENT:  Head: Normocephalic.  Eyes: Conjunctivae are normal. No scleral icterus.  Neck: Normal range of motion.  Cardiovascular: Normal rate and intact distal pulses.  Pulmonary/Chest: Effort normal.  Musculoskeletal:  Upper extremities are contracted  Neurological: She is alert.  Skin: Skin is warm and dry.  Nursing note and vitals reviewed.   ED Course/Procedures   Clinical Course as of Jun 26 711  Sat Jun 24, 2017  2331 Plan: We will obtain MRI brain and then have neuro consult.  [HM]  2332 Therapeutic INR: 2.04 [HM]  2332 Tachycardia on arrival with small improvement. Pulse Rate: 98 [HM]    Clinical Course User Index [HM] Latrica Clowers, Boyd Kerbs     Procedures  Results for orders placed or performed during the hospital encounter of 06/24/17  Protime-INR  Result Value Ref Range   Prothrombin Time 22.8 (H) 11.4 - 15.2 seconds   INR 2.04   APTT  Result Value Ref Range   aPTT 45 (H) 24 - 36 seconds  CBC  Result Value Ref Range   WBC 6.4 4.0 - 10.5 K/uL   RBC 4.69 3.87 - 5.11 MIL/uL   Hemoglobin 11.3 (L) 12.0 - 15.0 g/dL   HCT 16.1 09.6 - 04.5 %   MCV 78.0 78.0 - 100.0 fL   MCH 24.1 (L) 26.0 - 34.0 pg   MCHC 30.9 30.0 - 36.0 g/dL   RDW 40.9  81.1 - 91.4 %   Platelets 350 150 - 400 K/uL  Differential  Result Value Ref Range   Neutrophils Relative % 53 %   Neutro Abs 3.4 1.7 - 7.7 K/uL   Lymphocytes Relative 37 %   Lymphs Abs 2.4 0.7 - 4.0 K/uL   Monocytes Relative 8 %   Monocytes Absolute 0.5 0.1 - 1.0 K/uL   Eosinophils Relative 1 %   Eosinophils Absolute 0.1 0.0 - 0.7 K/uL   Basophils Relative 1 %   Basophils Absolute 0.0 0.0 - 0.1 K/uL  Comprehensive metabolic panel  Result Value Ref Range   Sodium 139 135 - 145 mmol/L   Potassium 3.8 3.5 - 5.1 mmol/L   Chloride 107 101 - 111 mmol/L   CO2 23 22 - 32 mmol/L   Glucose, Bld 119 (H) 65 - 99 mg/dL   BUN 9 6 - 20 mg/dL   Creatinine, Ser 7.82 0.44 - 1.00 mg/dL   Calcium 8.2 (L) 8.9 - 10.3 mg/dL   Total Protein 6.6 6.5 - 8.1 g/dL   Albumin 2.6 (L) 3.5 - 5.0 g/dL   AST 32 15 - 41 U/L   ALT 42 14 - 54 U/L   Alkaline Phosphatase 128 (H)  38 - 126 U/L   Total Bilirubin 0.8 0.3 - 1.2 mg/dL   GFR calc non Af Amer >60 >60 mL/min   GFR calc Af Amer >60 >60 mL/min   Anion gap 9 5 - 15  Magnesium  Result Value Ref Range   Magnesium 1.8 1.7 - 2.4 mg/dL  I-stat troponin, ED  Result Value Ref Range   Troponin i, poc 0.00 0.00 - 0.08 ng/mL   Comment 3          I-Stat Chem 8, ED  Result Value Ref Range   Sodium 141 135 - 145 mmol/L   Potassium 3.8 3.5 - 5.1 mmol/L   Chloride 104 101 - 111 mmol/L   BUN 9 6 - 20 mg/dL   Creatinine, Ser 9.14 0.44 - 1.00 mg/dL   Glucose, Bld 782 (H) 65 - 99 mg/dL   Calcium, Ion 9.56 (L) 1.15 - 1.40 mmol/L   TCO2 24 22 - 32 mmol/L   Hemoglobin 11.9 (L) 12.0 - 15.0 g/dL   HCT 21.3 (L) 08.6 - 57.8 %  I-Stat beta hCG blood, ED  Result Value Ref Range   I-stat hCG, quantitative <5.0 <5 mIU/mL   Comment 3           Ct Head Wo Contrast  Result Date: 06/24/2017 CLINICAL DATA:  Pt states she had 2 seizures this morning. No prior history of seizures. Denies any pain. Pt had a recent stroke. EXAM: CT HEAD WITHOUT CONTRAST TECHNIQUE: Contiguous axial  images were obtained from the base of the skull through the vertex without intravenous contrast. COMPARISON:  06/22/2017 FINDINGS: Brain: There is encephalomalacia the right temporal and parietal lobes and right basal ganglia, compatible with remote infarct. There is no intra or extra-axial fluid collection or mass lesion. The basilar cisterns and ventricles have a normal appearance. There is no CT evidence for acute infarction or hemorrhage. Vascular: No hyperdense vessel or unexpected calcification. Skull: Normal. Negative for fracture or focal lesion. Sinuses/Orbits: No acute finding. Other: None IMPRESSION: 1. Remote right middle cerebral artery infarct. 2.  No evidence for acute  abnormality. Electronically Signed   By: Norva Pavlov M.D.   On: 06/24/2017 18:25   [name] IO Contrast  Result Date: 06/25/2017 CLINICAL DATA:  Witnessed seizure 2 days ago, altered mental status and fatigue. History of stroke and LEFT-sided weakness. EXAM: MRI HEAD WITHOUT AND WITH CONTRAST TECHNIQUE: Multiplanar, multiecho pulse sequences of the brain and surrounding structures were obtained without and with intravenous contrast. CONTRAST:  16mL MULTIHANCE GADOBENATE DIMEGLUMINE 529 MG/ML IV SOLN COMPARISON:  CT HEAD June 24, 2017 and MRI of the head June 22, 2016 FINDINGS: INTRACRANIAL CONTENTS: Faint, less conspicuous reduced diffusion within area of RIGHT frontotemporal and RIGHT basal ganglia, RIGHT corona radiata/centrum semiovale encephalomalacia with minimal decreased ADC values versus artifact considering rounding T2 shine through. Minimal enhancement associated enhancement. Wallerian degeneration RIGHT cerebral peduncle. No new areas of reduced diffusion. Mineralization in area of encephalomalacia without acute hemorrhage. Ex vacuo dilatation RIGHT lateral ventricle, no hydrocephalus. No midline shift, mass effect or masses. No abnormal extra-axial fluid collections. No abnormal parenchymal or extra-axial  enhancement. VASCULAR: Normal major intracranial vascular flow voids present at skull base. SKULL AND UPPER CERVICAL SPINE: No abnormal sellar expansion. No suspicious calvarial bone marrow signal. Craniocervical junction maintained. SINUSES/ORBITS: Trace paranasal sinus mucosal thickening. Minimal bilateral mastoid effusions.The included ocular globes and orbital contents are non-suspicious. OTHER: None. IMPRESSION: 1. No acute intracranial process. 2. Subacute propagation of old RIGHT  MCA territory infarct. Electronically Signed   By: Awilda Metroourtnay  Bloomer M.D.   On: 06/25/2017 02:45     MDM   With several seizures over the last few days.  She is currently on Keppra.  MRI is without acute intracranial process.  Patient was evaluated by Dr. Otelia LimesLindzen of neurology.  He gives the following recommendations:   1. Switch Keppra to Depakote with 6 day overlap while tapering off Keppra.  2. IV valproic acid load 20 mg/kg has been ordered. Discharge with a prescription for PO valproic acid at 5 mg/kg TID. 3. Keppra taper by 250 mg every 2 days: 250 qam and 500 qhs days 1-2; 250 mg BID days 3-4; 250 mg qhs days 5-6, then stop. Education provided to patient, but please present her with written instructions. 4. EEG as outpatient to be arranged at follow up appointment with Dr. Roda ShuttersXu 5. Discontinue Tramadol, as it can lower the seizure threshold.  6. Daily massage to left trapezius and posterior neck to limit development of painful contracture. May need long term PT/OT for this.  Patient may be discharged home with outpatient follow-up.  Prescriptions written.  Depakote load given in the emergency department.  A Keppra taper discussed.   Seizures [facility](HCC)     Brylin Stanislawski, Boyd KerbsHannah, PA-C 06/25/17 [id number]    Derwood KaplanNanavati, Ankit, MD 06/26/17 [phone]

## 2017-06-24 NOTE — ED Notes (Signed)
Pt still waiting for MRI. No complaints.

## 2017-06-24 NOTE — ED Notes (Signed)
Patient transported to CT 

## 2017-06-24 NOTE — ED Notes (Signed)
Patient denies pain and is resting comfortably.  

## 2017-06-24 NOTE — ED Notes (Signed)
Pt was able to ambulate with stand by assistance and her cane.  Denied feeling dizzy.

## 2017-06-24 NOTE — ED Provider Notes (Signed)
[facility] EMERGENCY DEPARTMENT Provider Note   CSN: [id number] Arrival date & time: 06/24/17  1603   History   Chief Complaint Chief Complaint  Patient presents with  . Fatigue    HPI Shannon Erickson is a 42 y.o. 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   Per patient and patient's daughter-in-law the patient was admitted to [facility] on 06/22/2016 for two witnessed seizures with convulsions. These were the patient's first seizures. She was discharged from the hospital on Keppra. Since her discharge, the patient has been fatigued, unsteady on her feet with worsening lower extremity weakness from baseline, and complaining of diffuse muscle soreness. She denies seizures since discharge and has been compliant with Keppra. She states she has had episodes of confusion and memory loss, which have worsened over the past few days. Per patient's family the patient has been having 2-3 minuts episodes of "gazing/staring off" into space with confusion and not remembering the staring spells. What concerned the family the most was her unsteady gait, which prompted her visit to the ED. She has been in her normal state of health until the seizures. No preceding infections. Denies fevers, chills, headaches, dysuria, hematuria, N/V/D. Per review of patient's medications she is on several CNS acting and sedating medications: keppra, gabapentin, Topamax, and sertraline.    The history is provided by the patient, a caregiver and medical records. No language interpreter was used.    Past Medical History:  Diagnosis Date  . Lesion of left ulnar nerve   . Meningitis 1995  . Migraine   . Stroke [facility])     Patient Active Problem List   Diagnosis Date Noted  . Cognitive deficit due to  recent stroke 02/20/2017  . Spasticity 01/17/2017  . Absence of bladder continence   . Fall against object   . Spastic hemiparesis (HCC)   . Acute rhinitis   . Slow transit constipation   . 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 Gait disturbance, post-stroke 12/10/2016  . Hemiparesis affecting left side as late effect of cerebrovascular accident (HCC)   . Left-sided visual neglect   . Antiphospholipid syndrome (HCC)   . Pain   . Prediabetes   . Dysphagia, post-stroke   . Carotid atherosclerosis, bilateral   . Thyroid nodule   . Hyperlipidemia   . Chronic migraine without aura without status migrainosus, not intractable   . Headache 12/03/2016  . Hypokalemia 12/03/2016  . 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 Left spastic hemiparesis [facility])     Past Surgical History:  Procedure Laterality Date  . CESAREAN SECTION Bilateral 1995, 2002  . NO PAST SURGERIES    . TEE WITHOUT CARDIOVERSION N/A 12/05/2016   Procedure: TRANSESOPHAGEAL ECHOCARDIOGRAM (TEE);  Surgeon: Elease Hashimoto Deloris Ping, MD;  Location: [facility] ENDOSCOPY;  Service: Cardiovascular;  Laterality: N/A;    OB History    Gravida Para Term Preterm AB Living   3 3 3     3    SAB TAB Ectopic Multiple Live Births                   Home Medications    Prior to Admission medications   Medication Sig Start Date End Date Taking? Authorizing Provider  acetaminophen (TYLENOL) 325 MG tablet Take 650 mg by mouth every 6 (six) hours  as needed for mild pain.    [provider]  aspirin 325 MG tablet Take 1 tablet (325 mg total) by mouth daily. 01/13/17   Angiulli, Mcarthur Rossetti, PA-C  atorvastatin (LIPITOR) 80 MG tablet Take 1 tablet (80 mg total) by mouth daily at 6 PM. 01/31/17   Kirsteins, Victorino Sparrow, MD  clopidogrel (PLAVIX) 75 MG tablet Take 1 tablet (75 mg total) by mouth daily. 01/31/17   Kirsteins, Victorino Sparrow, MD  tiZANidine (ZANAFLEX) 2 MG tablet TAKE 1 TABLET BY MOUTH IN THE MORNING, 1 TABLET IN  THE AFTERNOON, 3 TABLETS AT BEDTIME 03/27/17   Kirsteins, Victorino Sparrow, MD  topiramate (TOPAMAX) 25 MG tablet Take 1 tablet (25 mg total) by mouth daily. 01/31/17   Kirsteins, Victorino Sparrow, MD  traMADol (ULTRAM) 50 MG tablet Take 1 tablet (50 mg total) by mouth every 6 (six) hours as needed for moderate pain. 01/12/17   Angiulli, Mcarthur Rossetti, PA-C  warfarin (COUMADIN) 5 MG tablet Take 1 tablet (5 mg total) by mouth daily at 6 PM. 03/07/17   Marvel Plan, MD    Family History Family History  Problem Relation Age of Onset  . Lung cancer Mother        mets to brain, bone, liver  . Hypertension Father   . Heart attack Brother        at age 67  . Renal cancer Maternal Grandfather   . Heart attack Paternal Grandfather     Social History Social History   Tobacco Use  . Smoking status: Never Smoker  . Smokeless tobacco: Never Used  Substance Use Topics  . Alcohol use: No  . Drug use: No     Allergies   Penicillins   Review of Systems Review of Systems  Constitutional: Positive for fatigue. Negative for chills and fever.  HENT: Positive for hearing loss. Negative for ear pain and trouble swallowing.   Eyes: Negative for visual disturbance.  Respiratory: Negative for shortness of breath.   Cardiovascular: Negative for chest pain.  Gastrointestinal: Positive for constipation. Negative for abdominal pain, diarrhea, nausea and vomiting.  Genitourinary: Negative for difficulty urinating and dysuria.  Musculoskeletal: Positive for gait problem.  Neurological: Positive for dizziness, speech difficulty and weakness. Negative for numbness.     Physical Exam Updated Vital Signs BP (!) 111/44   Pulse 96   Temp 98.6 F (37 C) (Oral)   Resp 18   Ht 5\' 3"  (1.6 m)   Wt 83 kg (183 lb)   SpO2 99%   BMI 32.42 kg/m   Physical Exam  Constitutional: She is oriented to person, place, and time. She appears well-developed and well-nourished. No distress.  HENT:  Head: Normocephalic and atraumatic.    Eyes: EOM are normal. Pupils are equal, round, and reactive to light.  Neck: Normal range of motion.  Cardiovascular: Normal rate, regular rhythm, normal heart sounds and intact distal pulses.  Pulmonary/Chest: Effort normal and breath sounds normal.  Abdominal: Soft. Bowel sounds are normal. There is no tenderness.  Musculoskeletal: She exhibits no edema.  Neurological: She is alert and oriented to person, place, and time. She displays abnormal reflex. No cranial nerve deficit or sensory deficit.  Reflex Scores:      Patellar reflexes are 2+ on the right side and 4+ on the left side. LUE: 0/5 strength; contracted   LLE: proximal and distal weakness; 3/5 proximal strength and 1/5 distal strength at ankle and foot   Skin: Skin is warm and dry.  ED Treatments / Results  Labs (all labs ordered are listed, but only abnormal results are displayed) Labs Reviewed  PROTIME-INR - Abnormal; Notable for the following components:      Result Value   Prothrombin Time 22.8 (*)    All other components within normal limits  APTT - Abnormal; Notable for the following components:   aPTT 45 (*)    All other components within normal limits  CBC - Abnormal; Notable for the following components:   Hemoglobin 11.3 (*)    MCH 24.1 (*)    All other components within normal limits  COMPREHENSIVE METABOLIC PANEL - Abnormal; Notable for the following components:   Glucose, Bld 119 (*)    Calcium 8.2 (*)    Albumin 2.6 (*)    Alkaline Phosphatase 128 (*)    All other components within normal limits  I-STAT CHEM 8, ED - Abnormal; Notable for the following components:   Glucose, Bld 112 (*)    Calcium, Ion 1.09 (*)    Hemoglobin 11.9 (*)    HCT 35.0 (*)    All other components within normal limits  DIFFERENTIAL  I-STAT TROPONIN, ED  I-STAT BETA HCG BLOOD, ED ([facility], [facility], [facility] ONLY)    EKG  EKG Interpretation  Date/Time:  Saturday June 24 2017 16:25:50 EST Ventricular Rate:  99 PR  Interval:  116 QRS Duration: 84 QT Interval:  350 QTC Calculation: 449 R Axis:   49 Text Interpretation:  Normal sinus rhythm Cannot rule out Anterior infarct , age undetermined Abnormal ECG No significant change since last tracing Confirmed by Doug SouJacubowitz, Sam [phone]([id number]) on 06/24/2017 7:15:19 PM       Radiology Ct Head Wo Contrast  Result Date: 06/24/2017 CLINICAL DATA:  Pt states she had 2 seizures this morning. No prior history of seizures. Denies any pain. Pt had a recent stroke. EXAM: CT HEAD WITHOUT CONTRAST TECHNIQUE: Contiguous axial images were obtained from the base of the skull through the vertex without intravenous contrast. COMPARISON:  06/22/2017 FINDINGS: Brain: There is encephalomalacia the right temporal and parietal lobes and right basal ganglia, compatible with remote infarct. There is no intra or extra-axial fluid collection or mass lesion. The basilar cisterns and ventricles have a normal appearance. There is no CT evidence for acute infarction or hemorrhage. Vascular: No hyperdense vessel or unexpected calcification. Skull: Normal. Negative for fracture or focal lesion. Sinuses/Orbits: No acute finding. Other: None IMPRESSION: 1. Remote right middle cerebral artery infarct. 2.  No evidence for acute  abnormality. Electronically Signed   By: Norva PavlovElizabeth  Brown M.D.   On: 06/24/2017 18:25    Procedures Procedures (including critical care time)  Medications Ordered in ED Medications - No data to display   Initial Impression / Assessment and Plan / ED Course  I have reviewed the triage vital signs and the nursing notes.  Pertinent labs & imaging results that were available during my care of the patient were reviewed by me and considered in my medical decision making (see chart for details).     Patient afebrile, VSS and labs without significant abnormality. Patient presenting with multiple complaints including memory loss, confusion, worsened lower extremity weakness, staring  spells and dizziness for the past two days. MMSE performed at bedside and patient performed well, patient AOx4. Physical exam consistent with prior left upper and lower extremity deficits, do not appear to be acutely worsened, no new focal deficits. Patient has been compliant with Keppra, but has been experiencing 2-3 minute staring spells episodes  witnessed by family, concerning for absence seizures. CT head negative for acute infarction or hemorrhage. Will get MRI brain W and WO contrast to rule out posterior circulation CVA in setting of new onset dizziness. Will consult neurology for evaluation and recommendations.   Spoke to Neurology consultant, Dr. Otelia Limes. Per his recommendations will give patient supplemental dose Keppra 500 mg IV. Will plan to increase Keppra dosage to 750 mg BID. Dr. Otelia Limes to evaluate patient and determine if admission and inpatient EEG monitoring is required.    2213: Patient is still waiting for MRI of brain W/WO contrast.    2229:  Patient signed out to Dr. Ethelda Chick.   Final Clinical Impressions(s) / ED Diagnoses   Final diagnoses:  None    ED Discharge Orders    None       Toney Rakes, MD 06/24/17 2229    Doug Sou, MD 07/01/17 [phone]

## 2017-06-24 NOTE — Consult Note (Signed)
NEURO HOSPITALIST CONSULT NOTE   Requestig physician: Dr. Ethelda Chick  Reason for Consult: Recurrent seizures  History obtained from:  Patient and Chart     HPI:                                                                                                                                          Shannon Erickson is an 42 y.o. 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 She sees Dr. Roda Shutters in follow up for her stroke and is on Coumadin for secondary stroke prevention in the setting of antiphospholipid antibody syndrome (APLA). She is compliant with her anticoagulation. At baseline she walks with a small 4-leg walker, which she uses with her right hand. Her LUE is plegic from the stroke. Her first seizure occurred on Thursday morning with generalized shaking and LOC lasting 7 minutes. She had a second seizure witnessed by EMS during transport to [facility]. She was loaded with Keppra 1000 mg PO on Thursday followed by a second dose on Friday with no further seizures and was discharged home. She then slept the entire day and on Saturday, got her AM dose of 500 mg Keppra. She subsequently had a staring spell lasting about 2-3 minutes, followed by about 3 minutes of drowsiness. Two more similar spells occurred and she and her partner decided to be seen again in the ED. The staring spells can be accompanied by upward and lateral eye deviation but there is no eyelid or eyeball twitching per her partner. Also without limb jerking during the spells - she is "out of it" and not responsive to voice during the spells.   MRI brain reveals stable right MCA territory ischemic infarction.   Past Medical History:  Diagnosis Date  . Lesion of left ulnar nerve   . Meningitis 1995  . Migraine   . Stroke  [facility])     Past Surgical History:  Procedure Laterality Date  . CESAREAN SECTION Bilateral 1995, 2002  . NO PAST SURGERIES    . TEE WITHOUT CARDIOVERSION N/A 12/05/2016   Procedure: TRANSESOPHAGEAL ECHOCARDIOGRAM (TEE);  Surgeon: Elease Hashimoto Deloris Ping, MD;  Location: [facility] ENDOSCOPY;  Service: Cardiovascular;  Laterality: N/A;    Family History  Problem Relation Age of Onset  . Lung cancer Mother        mets to brain, bone, liver  . Hypertension Father   . Heart attack Brother        at age 44  . Renal cancer Maternal Grandfather   . Heart attack Paternal Grandfather    Social History:  reports that  has never smoked. she  has never used smokeless tobacco. She reports that she does not drink alcohol or use drugs.  Allergies  Allergen Reactions  . Penicillins Hives    Has patient had a PCN reaction causing immediate rash, facial/tongue/throat swelling, SOB or lightheadedness with hypotension: Yes Has patient had a PCN reaction causing severe rash involving mucus membranes or skin necrosis: Yes Has patient had a PCN reaction that required hospitalization: Unk Has patient had a PCN reaction occurring within the last 10 years: No If all of the above answers are "NO", then may proceed with Cephalosporin use.     HOME MEDICATIONS:                                                                                                                       ROS:                                                                                                                                       As per HPI.   Blood pressure 111/78, pulse 95, temperature 98.6 F (37 C), temperature source Oral, resp. rate 17, height 5\' 3"  (1.6 m), weight 83 kg (183 lb), SpO2 98 %.  General Examination:                                                                                                      HEENT-  [place]/AT   Lungs - Respirations unlabored Extremities - Warm and well perfused  Neurological  Examination Mental Status: Alert, oriented, thought content appropriate.  Speech fluent without evidence of aphasia.  Able to follow all commands without difficulty. Cranial Nerves: II: Visual fields intact without extinction to DSS. PERRL.   III,IV, VI: EOMI without nystagmus. No ptosis.  V,VII: Slight lag on the left when smiling. Temp sensation equal bilaterally.  VIII: Hearing intact to voice IX,X: No hypophonia XI: Weak to absent shoulder shrug on the left. Increased tone left trapezius XII: No lingual dysarthria Motor: RUE and RLE: 5/5  LUE 0/5 proximal and distal LLE: 4/5 proximal and distal Sensory: Temp subjectively intact bilateral upper and lower extremities. FT subjectively intact x 4. Positive for extinction on the left.  Deep Tendon Reflexes:  Right: 2+ right biceps, brachioradialis, patella and achilles. Left: 3+ left biceps, 4+ brachioradialis, 4+ patella (sustained clonus), 3+ achilles Plantars: Right: downgoing   Left: Briskly upgoing Cerebellar: No ataxia with FNF on right. Unable to perform on left.  Gait: Deferred   Lab Results: Basic Metabolic Panel: Recent Labs  Lab 06/24/17 1630 06/24/17 1646  NA 139 141  K 3.8 3.8  CL 107 104  CO2 23  --   GLUCOSE 119* 112*  BUN 9 9  CREATININE 0.81 0.80  CALCIUM 8.2*  --     CBC: Recent Labs  Lab 06/24/17 1630 06/24/17 1646  WBC 6.4  --   NEUTROABS 3.4  --   HGB 11.3* 11.9*  HCT 36.6 35.0*  MCV 78.0  --   PLT 350  --     Cardiac Enzymes: No results for input(s): CKTOTAL, CKMB, CKMBINDEX, TROPONINI in the last 168 hours.  Lipid Panel: No results for input(s): CHOL, TRIG, HDL, CHOLHDL, VLDL, LDLCALC in the last 168 hours.  Imaging: Ct Head Wo Contrast  Result Date: 06/24/2017 CLINICAL DATA:  Pt states she had 2 seizures this morning. No prior history of seizures. Denies any pain. Pt had a recent stroke. EXAM: CT HEAD WITHOUT CONTRAST TECHNIQUE: Contiguous axial images were obtained from the base of the  skull through the vertex without intravenous contrast. COMPARISON:  06/22/2017 FINDINGS: Brain: There is encephalomalacia the right temporal and parietal lobes and right basal ganglia, compatible with remote infarct. There is no intra or extra-axial fluid collection or mass lesion. The basilar cisterns and ventricles have a normal appearance. There is no CT evidence for acute infarction or hemorrhage. Vascular: No hyperdense vessel or unexpected calcification. Skull: Normal. Negative for fracture or focal lesion. Sinuses/Orbits: No acute finding. Other: None IMPRESSION: 1. Remote right middle cerebral artery infarct. 2.  No evidence for acute  abnormality. Electronically Signed   By: Norva Pavlov M.D.   On: 06/24/2017 18:25   MRI brain:  1. No acute intracranial process. 2. Subacute propagation of old RIGHT MCA territory infarct.  Assessment: 42 year old female with new onset seizures on Thursday, most likely secondary to seizure focus at the location of chronic right MCA stroke. Presents with new onset of staring spells, likely epileptic.  1. 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  2. Has had her tubes tied, therefore more flexibility in selecting an anticonvulsant.  3. Right MCA territory ischemic infarction in July with residual deficit of plegic LUE and paretic LLE. Diagnosed with APLA and is taking Coumadin for secondary stroke prevention.   Recommendations: 1. Switch Keppra to Depakote with 6 day overlap while tapering off Keppra.  2. IV valproic acid load 20 mg/kg has been ordered. Discharge with a prescription for PO valproic acid at 5 mg/kg TID. 3. Keppra taper by 250 mg every 2 days: 250 qam and 500 qhs days 1-2; 250 mg BID days 3-4; 250 mg qhs days 5-6, then stop. Education provided to patient, but please present her with written instructions. 4. EEG as outpatient to be arranged at follow up appointment with Dr.  Roda Shutters 5. Discontinue Tramadol, as it can lower the seizure threshold.  6. Daily massage to left trapezius and posterior neck to limit development  of painful contracture. May need long term PT/OT for this. 7. Per [place] statutes, patients with seizures are not allowed to drive until  they have been seizure-free for six months. Use caution when using heavy equipment or power tools. Avoid working on ladders or at heights. Take showers instead of baths. Ensure the water temperature is not too high on the home water heater. Do not go swimming alone. When caring for infants or small children, sit down when holding, feeding, or changing them to minimize risk of injury to the child in the event you have a seizure. Also, Maintain good sleep hygiene. Avoid alcohol.  Electronically signed: Dr. Caryl PinaEric Abagael Kramm 06/24/2017, 7:48 PM

## 2017-06-24 NOTE — ED Triage Notes (Signed)
Pt presents to ED for assessment after having two witnessed seizures and treatment on Thursday, [facility], and now states she is having increased generalized weakness (left sided residual from a stroke in July 2018), fatigue, aching to the left side, and staring off in the middle of conversations.  Pt AxO at triage

## 2017-06-24 NOTE — ED Notes (Addendum)
Pt ready for MRI. MRI aware. Have one person ahead of her.

## 2017-06-24 NOTE — ED Notes (Signed)
Patient transported to MRI 

## 2017-06-24 NOTE — ED Notes (Signed)
Patient returned from CT

## 2017-06-24 NOTE — ED Provider Notes (Signed)
Patient had 2 generalized seizures 2 days ago witnessed by her daughter-in-law.  She is also had several episodes where she stares off into space over the past 2 days per her daughter-in-law who accompanies her.  Other associated symptoms include fatigue.  She denies pain anywhere.  Patient has history of stroke with residual left arm and left leg weakness and walks with walker.   Doug SouJacubowitz, Aubryn Spinola, MD 06/24/17 2342

## 2017-06-25 ENCOUNTER — Emergency Department (HOSPITAL_COMMUNITY): Payer: Medicaid Other

## 2017-06-25 LAB — MAGNESIUM: MAGNESIUM: 1.8 mg/dL (ref 1.7–2.4)

## 2017-06-25 MED ORDER — GADOBENATE DIMEGLUMINE 529 MG/ML IV SOLN
16.0000 mL | Freq: Once | INTRAVENOUS | Status: AC | PRN
  Administered 2017-06-25: 16 mL via INTRAVENOUS

## 2017-06-25 MED ORDER — VALPROIC ACID 250 MG PO CAPS: 500.0000 mg | ORAL_CAPSULE | Freq: Three times a day (TID) | ORAL | 0 refills | Status: DC

## 2017-06-25 MED ORDER — DEXTROSE 5 % IV SOLN
1650.0000 mg | Freq: Once | INTRAVENOUS | Status: AC
  Administered 2017-06-25: 1650 mg via INTRAVENOUS
  Filled 2017-06-25: qty 16.5

## 2017-06-25 NOTE — ED Notes (Signed)
Patient denies pain and is resting comfortably.  

## 2017-06-25 NOTE — ED Notes (Addendum)
Neuro md (Dr. Otelia LimesLindzen) at bedside. Pt stable. No complaints.

## 2017-06-25 NOTE — Discharge Instructions (Addendum)
1. Medications: taper Keppra (taper taper by 250 mg every 2 days: 250 qam and 500 qhs days 1-2; 250 mg BID days 3-4; 250 mg qhs days 5-6, then stop), start Valproic acid, usual home medications 2. Treatment: rest, drink plenty of fluids,  3. Follow Up: Please followup with your neurologist in 2-3 days for discussion of your diagnoses and further evaluation after today's visit; if you do not have a primary care doctor use the resource guide provided to find one; Please return to the ER for recurrent seizures, altered mental status or other concerns.

## 2017-06-25 NOTE — ED Notes (Signed)
Nurse collecting labs. 

## 2017-06-25 NOTE — ED Notes (Signed)
Pt to MRI with transport.

## 2017-06-25 NOTE — ED Notes (Signed)
Spoke with Dr. Thompson CaulMuthersbach about pt status. Md to reassess pt and dispo.

## 2017-06-26 ENCOUNTER — Telehealth: Payer: Self-pay | Admitting: Neurology

## 2017-06-26 NOTE — Telephone Encounter (Signed)
Dr. Roda ShuttersXu, Patients mother called and stated that patient was seen in the [facility] yesterday for seizures and was told to f/u with you asap. You do not have any open appointments until March 19/ pt's mother stated that she can not waite that long. Please call patient's mother @ [phone]     Thank you Docia ChuckBetty Spell

## 2017-06-26 NOTE — Telephone Encounter (Signed)
Rn spoke with Dr. Marjory LiesPenumalli about pt needing to be seen asap by Dr.Xu. Rn explain to Dr. Marjory LiesPenumalli pt had stroke about 6 months agp, and had new onset of seizures 1/3/12019,and 06/23/2017.Rn stated pt has had a medication change,and is tapering off one. Dr. Marjory LiesPenumalli stated pt can see Dr. Pearlean BrownieSethi next week along with Shanda BumpsJessica NP. Dr. Marjory LiesPenumalli stated the seizure medication has to take time to get in the system. If patient starts having seizures again, they need to seek the nearest ED.

## 2017-06-26 NOTE — Telephone Encounter (Signed)
RN receive incoming call from patients daughter in law [name] on dpr form Rn explain pts mom call but she is not on dpr form. Rn stated all the numbers were not active. [name] stated her number, and pts son numbers have change. The numbers have been updated in the chart. [name] stated her mother in law had seizure this past weekend, and [facility]. Pt also had seizure on 06/22/2017 and was at [facility]. The pt was on keppra but they are tapering her off. She is on depakene 500mg  three times a day. The daughter in law states the pt still has some blank stares for about 45 seconds to a minute at times. The Ed doctor states [facility]. Rn explain Dr. Roda ShuttersXu is out of the office this week,and is in a conference. Tifffany ask if patient can see someone else. Rn stated she will ask other MD if they have any availability this week. Rn stated a call will be made to her. [name] verbalized understanding.

## 2017-06-26 NOTE — Telephone Encounter (Signed)
Rn call patients daughter in law [name] back. Rn gave her Dr. Marjory LiesPenumalli suggestions about seeing Dr. Pearlean BrownieSethi next week. PT schedule June 26, 2017 at 0130, check in time is at 0100pm.

## 2017-06-26 NOTE — Telephone Encounter (Signed)
Tried calling patients son, and daughter in law number.Both numbers listed for patient are disconnected. Rn cannot speak with patients mom about pt she is not on the dpr. People listed are her son,and daughter in law.  Tried calling Shannon Erickson pts daughter in law number at [phone]. The person who answer [phone] was Shannon Erickson mom. She stated it was not Shannon Erickson number.Will send pt a letter to schedule appt.

## 2017-07-03 ENCOUNTER — Encounter: Payer: Self-pay | Admitting: Neurology

## 2017-07-03 ENCOUNTER — Ambulatory Visit: Payer: Medicaid Other | Admitting: Neurology

## 2017-07-03 VITALS — BP 130/65 | HR 80 | Wt 186.2 lb

## 2017-07-03 DIAGNOSIS — G40909 Epilepsy, unspecified, not intractable, without status epilepticus: Secondary | ICD-10-CM

## 2017-07-03 MED ORDER — DIVALPROEX SODIUM 500 MG PO DR TAB: 1000.0000 mg | DELAYED_RELEASE_TABLET | Freq: Every day | ORAL | 2 refills | Status: DC

## 2017-07-03 NOTE — Progress Notes (Signed)
.set    STROKE NEUROLOGY FOLLOW UP NOTE  NAME: Shannon Erickson DOB: [birth year]  REASON FOR VISIT: stroke follow up HISTORY FROM: pt and son and chart  Today we had the pleasure of seeing Shannon Erickson in follow-up at our Neurology Clinic. Pt was accompanied by son.   History Summary Shannon Erickson is a 42 y.o. female with history of migraine headaches was admitted on 7/14/8 for left-sided weakness. she was healthy PTA. She was found to have large right MCA infarct on MRI. MRA right M1 occlusion. CTA head and neck proximal right M1 occlusion, b/l ICA bifurcation soft plaques concerning for ulceration or intraplaque hemorrhage. TCD bubble study no PFO and emboli detection no MES. EF 60-65%. TEE unremarkable EF 50%. DVT negative. LDL 138 and A1C 6.2. Hypercoagulable work up showed beta-2-glycoprotein IgG 109, very high. She was put on DAPT and high dose lipitor. She was discharge to [facility] with LUE 0/5 and LLE 2/5. Recommend to repeat beta-2 glycoprotein in 3 months.   Update 07/03/2017 ; Patient is seen emergently today as Dr. Roda Shutters is out of the office and patient was recently seen in the hospital for recurrent seizures on 06/25/17. I have reviewed hospital electronic medical records as well as imaging films.She had a witnessed generalized tonic clonic seizure at home which lasted about 7 minutes. EMS was called and she had a second witnessed seizure during transport to [facility]. She was loaded with Keppra 1 g followed by another gram the next day and discharged home. She slept the whole of next day and had another episode of staring spell lasting to 3 minutes 2 days later followed by some drowsiness. She had 2 similar episodes and presented to the ED at [facility]. She was started on Depakote by Dr. Otelia Limes as he felt Keppra was not being effective and he advised her to taper and stop the Keppra. MRI scan of the brain was obtained which showed old right MCA infarct but no acute  findings. Patient states she is tolerating Depakote without any nausea, diarrhea, dizziness or tremors. She states however she has had one more generalized seizure since leaving the hospital and one more episode of staring spell. She is currently on only 1500 mg of Depakote and she has not had levels checked since Depakote was started a week ago. 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 She is tolerating aspirin without bleeding or bruising. She states her blood pressure is well controlled and today it is 130/65. She is tolerating Coumadin well without bleeding or bruising for her antiphospholipid antibody syndrome.   REVIEW OF SYSTEMS: Full 14 system review of systems performed and notable only for those listed below and in HPI above, all others are negative:   Seizures, confusion, daytime sleepiness, memory loss, dizziness.  The following represents the patient's updated allergies and side effects list: Allergies  Allergen Reactions  . Penicillins Hives    Has patient had a PCN reaction causing immediate rash, facial/tongue/throat swelling, SOB or lightheadedness with hypotension: Yes Has patient had a PCN reaction causing severe rash involving mucus membranes or skin necrosis: Yes Has patient had a PCN reaction that required hospitalization: Unk Has patient had a PCN reaction occurring within the last 10 years: No If all of the above answers are "NO", then may proceed with Cephalosporin use.     The neurologically relevant items on the patient's problem list were reviewed on today's visit.  Neurologic  Examination  Neurological Examination Mental Status: Alert, oriented, thought content appropriate.  Speech fluent without evidence of aphasia.  Able to follow all commands without difficulty. Cranial Nerves: II: Visual fields intact without extinction to DSS. PERRL.   III,IV, VI: EOMI without nystagmus. No ptosis.  V,VII: Slight lag on the left when  smiling. Temp sensation equal bilaterally.  VIII: Hearing intact to voice IX,X: No hypophonia XI: Weak to absent shoulder shrug on the left. Increased tone left trapezius XII: No lingual dysarthria Motor: RUE and RLE: 5/5 LUE 0/5 proximal and distal with spasticity LLE: 4/5 proximal and distal with spasticity Sensory: Temp subjectively intact bilateral upper and lower extremities. FT subjectively intact x 4. Positive for extinction on the left.  Deep Tendon Reflexes:  Right: 2+ right biceps, brachioradialis, patella and achilles. Left: 3+ left biceps, 4+ brachioradialis, 4+ patella (sustained clonus), 3+ achilles Plantars: Right: downgoing                           Left: Briskly upgoing Cerebellar: No ataxia with FNF on right. Unable to perform on left.  Gait:  spastic hemiparetic gait with circumduction  Data reviewed: I personally reviewed the images and agree with the radiology interpretations.  Ct Head Code Stroke W/o Cm 12/03/2016 1. Negative mildly motion degraded noncontrast CT HEAD.  2. ASPECTS is 10.  Ct Angio Head W Or Wo Contrast Ct Angio Neck W Or Wo Contrast 12/03/2016 1. Proximal right M1 conclusion.  2. Expected evolution of right temporal lobe, insular cortex, and basal ganglia nonhemorrhagic infarction.  3. Focal irregularity at the right carotid bifurcation with a shelf-like soft tissue plaque but no significant stenosis relative to the more distal vessel.  4. More mild atherosclerotic changes at the left carotid bifurcation.  5. Fetal type posterior cerebral artery is bilaterally.  6. 11 mm thyroid nodule. Consider further evaluation with thyroid ultrasound. If patient is clinically hyperthyroid, consider nuclear medicine thyroid uptake and scan. Thyroid ultrasound can be done following discharge.   Mr Brain Wo Contrast 06/25/17 :1. No acute intracranial process. 2. Subacute propagation of old RIGHT MCA territory infarct.      Assessment:  she is a 42 y.o.  Caucasian female with PMH of migraine headaches was admitted on 7/14/8 for left-sided weakness frome large right MCA infarct in July 2018 secondary to APLA, with residual left sided plegia/paresis, recently diagnosed with seizures and started on Keppra, but did not respond well to this medicine and   presented with recurrent seizures on 06/24/17 and was started on Depakote with Keppra being tapered. She continues to have some breakthrough seizures on the current dose of Depakote    Plan:  - continue ASA and plavix and lipitor for stroke preventionI had a long discussion with the patient and her son regarding her recurrent breakthrough seizures. I would recommend increasing the dose of Depakote to 1 gm twice daily and changing to Depakote DR tablets. Discontinue Topamax 25 mg at night as patient is not sure why she is taking it. Check EEG as well as valproic acid level today. Patient was advised not to drive as per [facility] law. She was advised to stay on Coumadin for antiphospholipid antibody syndromes and to use a cane or walker at all times for ambulation for fall and safety prevention. She will return for follow-up in a month with Dr. Roda ShuttersXu or call earlier if necessary and aggressive risk factor modification. I spent more than 25 minutes  of face to face time with the patient. Greater than 50% of time was spent in counseling and coordination of care. About her seizures and stroke prevention discussion.    Delia Heady, MD [facility] [address] [place], [place] [address] [phone]

## 2017-07-03 NOTE — Patient Instructions (Addendum)
I had a long discussion with the patient and her son regarding her recurrent breakthrough seizures. I would recommend increasing the dose of Depakote to 1 g twice daily and changing to Depakote DR tablets. Discontinue Topamax 25 mg at night as patient is not sure why she is taking it. Check EEG as well as valproic acid level today. Patient was advised not to drive [place]. She was advised to stay on Coumadin for antiphospholipid antibody syndromes and to use a cane or walker at all times for ambulation for fall and safety prevention. She will return for follow-up in a month with Dr. Roda ShuttersXu or call earlier if necessary   Epilepsy Epilepsy is when a person keeps having seizures. A seizure is unusual activity in the brain. A seizure can change how you think or behave, and it can make it hard to be aware of what is happening. This condition can cause problems, such as:  Falls, accidents, and injury.  Depression.  Poor memory.  Sudden unexplained death in epilepsy (SUDEP). This is rare. Its cause is not known.  Most people with epilepsy lead normal lives. Follow these instructions at home: Medicines   Take medicines only as told by your doctor.  Avoid anything that may keep your medicine from working, such as alcohol. Activity  Get enough rest. Lack of sleep can make seizures more likely to occur.  Follow your doctor's advice about driving, swimming, and doing anything else that would be dangerous if you had a seizure. Teaching others Teach friends and family what to do if you have a seizure. They should:  Lay you on the ground to prevent a fall.  Cushion your head and body.  Loosen any tight clothing around your neck.  Turn you on your side.  Stay with you until you are better.  Not hold you down.  Not put anything in your mouth.  Know whether or not you need emergency care.  General instructions  Avoid anything that causes you to have seizures.  Keep a  seizure diary. Write down what you remember about each seizure, and especially what might have caused it.  Keep all follow-up visits as told by your doctor. This is important. Contact a doctor if:  You have a change in your seizure pattern.  You get an infection or start to feel sick. You may have more seizures when you are sick. Get help right away if:  A seizure does not stop after 5 minutes.  You have more than one seizure in a row, and you do not have enough time between the seizures to feel better.  A seizure makes it harder to breathe.  A seizure is different from other seizures you have had.  A seizure makes you unable to speak or use a part of your body.  You did not wake up right after a seizure. This information is not intended to replace advice given to you by your health care provider. Make sure you discuss any questions you have with your health care provider. Document Released: 03/06/2009 Document Revised: 12/14/2015 Document Reviewed: 11/17/2015 Elsevier Interactive Patient Education  [employer]2018 [employer]

## 2017-07-04 LAB — VALPROIC ACID LEVEL: VALPROIC ACID LVL: 78 ug/mL (ref 50–100)

## 2017-07-05 ENCOUNTER — Telehealth: Payer: Self-pay

## 2017-07-05 ENCOUNTER — Other Ambulatory Visit: Payer: Medicaid Other | Admitting: Neurology

## 2017-07-05 DIAGNOSIS — G40909 Epilepsy, unspecified, not intractable, without status epilepticus: Secondary | ICD-10-CM

## 2017-07-05 NOTE — Telephone Encounter (Signed)
Left vm for patients son [name] dpr to call back about his moms valproic levels. ------

## 2017-07-05 NOTE — Telephone Encounter (Signed)
Pt has called back in response to message from earlier, please call back

## 2017-07-05 NOTE — Telephone Encounter (Signed)
-----   Message from Micki RileyPramod S Sethi, MD sent at 07/04/2017  8:31 AM EST ----- Kindly inform the patient that valproic acid level was satisfactory. No new  changes at this time

## 2017-07-06 ENCOUNTER — Other Ambulatory Visit: Payer: Self-pay | Admitting: Neurology

## 2017-07-06 DIAGNOSIS — G40909 Epilepsy, unspecified, not intractable, without status epilepticus: Secondary | ICD-10-CM

## 2017-07-06 NOTE — Telephone Encounter (Signed)
Rn call patients phone her daughter in law [name] answer the phone who is on dpr. Rn stated the valporic acid level was satisfactory. NO new changes at this time. Rn stated pt was change to depakote DR 2 tablets daily. [name] pts daughter in law verbalized understanding. ------

## 2017-08-21 ENCOUNTER — Encounter: Payer: Self-pay | Admitting: Neurology

## 2017-08-21 ENCOUNTER — Ambulatory Visit: Payer: Medicaid Other | Admitting: Neurology

## 2017-08-21 VITALS — BP 129/76 | HR 105 | Wt 206.4 lb

## 2017-08-21 DIAGNOSIS — G8110 Spastic hemiplegia affecting unspecified side: Secondary | ICD-10-CM | POA: Diagnosis not present

## 2017-08-21 DIAGNOSIS — I63511 Cerebral infarction due to unspecified occlusion or stenosis of right middle cerebral artery: Secondary | ICD-10-CM | POA: Diagnosis not present

## 2017-08-21 DIAGNOSIS — G40909 Epilepsy, unspecified, not intractable, without status epilepticus: Secondary | ICD-10-CM | POA: Diagnosis not present

## 2017-08-21 DIAGNOSIS — D6861 Antiphospholipid syndrome: Secondary | ICD-10-CM | POA: Diagnosis not present

## 2017-08-21 MED ORDER — LEVETIRACETAM 750 MG PO TABS: 750.0000 mg | ORAL_TABLET | Freq: Two times a day (BID) | ORAL | 2 refills | Status: DC

## 2017-08-21 MED ORDER — DIVALPROEX SODIUM 500 MG PO DR TAB: DELAYED_RELEASE_TABLET | ORAL | 0 refills | Status: DC

## 2017-08-21 NOTE — Patient Instructions (Addendum)
-   continue coumadin for stroke prevention, INR goal 2.5-3.5 - will taper off depakote due to drug drug interaction with coumadin and weight gain side effect. Please take depakote 250mg  twice a day for one week and then 250mg  once a day for one week and then off. - start keppra 750mg  twice a day for seizure control - check INR tomorrow as scheduled, but please talk to your PCP office to check INR more closely such as every two weeks for the next 1-2 months as we stopped depakote which has interaction with coumadin - Follow up with your primary care physician for stroke risk factor modification. Recommend maintain blood pressure goal <130/80, diabetes with hemoglobin A1c goal below 7.0% and lipids with LDL cholesterol goal below 70 mg/dL.  - healthy diet and regular exercise - According to [place] law, you can not drive until seizure free for 6 months and under physician's care.  - Please maintain seizure precautions. Do not participate in activities where a loss of awareness could hurt you or someone else.  No swimming alone, no tub bathing, no hot tubs, no driving, no operating motorized vehicles(cars, ATVs, motorbikes, etc), lawnmowers or power tools.  No standing at heights, such as rooftops, ladders or stairs. No sliding boards, monkey bars or swings, or climbing trees.  Avoid hot objects such as stoves, heaters, open fires.  Wear a helmet when riding a bicycle, scooter, skateboard, etc. and avoid areas of traffic.  Set water heater to 120 degrees or less. When caring for infants or small children, sit down when holding, feeding, or changing them to minimize risk of injury to the child in the event you have a seizure. - will refer to Dr. Wynn BankerKirsteins for left arm spasticity and weakness - follow up with me on 10/18/17.

## 2017-08-21 NOTE — Progress Notes (Signed)
STROKE NEUROLOGY FOLLOW UP NOTE  NAME: Hubert Derstine DOB: [birth year]  REASON FOR VISIT: stroke follow up HISTORY FROM: pt and son and chart  Today we had the pleasure of seeing Odelia Graciano in follow-up at our Neurology Clinic. Pt was accompanied by son.   History Summary Ms. Sherrita Riederer is a 42 y.o. female with history of migraine headaches was admitted on 7/14/8 for left-sided weakness. she was healthy PTA. She was found to have large right MCA infarct on MRI. MRA right M1 occlusion. CTA head and neck proximal right M1 occlusion, b/l ICA bifurcation soft plaques concerning for ulceration or intraplaque hemorrhage. TCD bubble study no PFO and emboli detection no MES. EF 60-65%. TEE unremarkable EF 50%. DVT negative. LDL 138 and A1C 6.2. Hypercoagulable work up showed beta-2-glycoprotein IgG 109, very high. She was put on DAPT and high dose lipitor. She was discharge to CIR with LUE 0/5 and LLE 2/5. Recommend to repeat beta-2 glycoprotein in 3 months.   02/22/17 follow up - the patient has been doing better. Currently on outpt PT/OT. LLE improved much, able to walk with walker at home, but LLE still minimal movement. Able to work at home now and she likes to back to work.  Of note, she stated that her son who accompanied with him had PE 2 years ago and was diagnosed with APS, was put on coumadin but difficulty with INR, now on Xarelto. Pt denies any other FHx of APS. She also denies any DVT, PE or miscarriage.   ER visit 06/22/17 and 06/24/17 - pt went to [facility] ER on 06/22/17 for seizure activity with generalized shaking and LOC lasting 7 minutes. She had a second seizure witnessed by EMS during transport to [facility] ER. She was loaded with Keppra 1000 mg PO and followed by keppra 500mg  bid on discharge. On 06/24/17 pt back to [facility] ER again for staring spell lasting about 2-3 minutes, followed by about 3 minutes of drowsiness. Two more similar spells occurred and she was sent to ED. The  staring spells can be accompanied by upward and lateral eye deviation but there is no eyelid or eyeball twitching. No limb jerking during the spells - she is "out of it" and not responsive to voice during the spells. Dr. Otelia Limes tapered off her keppra and started on depakote 750mg  bid after loading dose.   06/23/17 follow up (PS) - Patient is seen emergently today as Dr. Roda Shutters is out of the office and patient was recently seen in the hospital for recurrent seizures on 06/25/17. I have reviewed hospital electronic medical records as well as imaging films.She had a witnessed generalized tonic clonic seizure at home which lasted about 7 minutes. EMS was called and she had a second witnessed seizure during transport to [facility]. She was loaded with Keppra 1 g followed by another gram the next day and discharged home. She slept the whole of next day and had another episode of staring spell lasting to 3 minutes 2 days later followed by some drowsiness. She had 2 similar episodes and presented to the ED at [facility]. She was started on Depakote by Dr. Otelia Limes as he felt Keppra was not being effective and he advised her to taper and stop the Keppra. MRI scan of the brain was obtained which showed old right MCA infarct but no acute findings. Patient states she is tolerating Depakote without any nausea, diarrhea, dizziness or tremors. She states however she has had one more generalized  seizure since leaving the hospital and one more episode of staring spell. She is currently on only 1500 mg of Depakote and she has not had levels checked since Depakote was started a week ago. 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 She is tolerating aspirin without bleeding or bruising. She states her blood pressure is well controlled and today it is 130/65. She is tolerating Coumadin well without bleeding or bruising for her antiphospholipid antibody syndrome.  Interval History During  the interval time, pt has been doing well without seizure or stroke recurrence. She told me that she is on depakote 500mg  bid now but on the drug bottle it showed 1000mg  daily but Dr. Pearlean Brownie last visit asked her to take 1000mg  bid. Her depakote level last visit in 06/2017 was 78. Her EEG 07/06/17 showed mild slow background and focal right temporal cortical irritability. She still on coumadin and last INR 2.8. She will has scheduled INR check tomorrow with her PCP. She told me that she has gained 22lbs. For the last two months. She still has left sided weakness, able to walk slowly with hemiwalker. Left arm still spastic and not able to move. Left shoulder pain on movement is significant. She said her PCP Dr. Marina Goodell did shoulder injection once last year for her. BP today 129/76.  REVIEW OF SYSTEMS: Full 14 system review of systems performed and notable only for those listed below and in HPI above, all others are negative:  Constitutional:  Cardiovascular: swelling in legs Ear/Nose/Throat:  Hearing loss Skin:  Eyes:   Respiratory:   Gastroitestinal:   Genitourinary:  Hematology/Lymphatic:    Endocrine:  Musculoskeletal:   Allergy/Immunology:   Neurological:  weakness Psychiatric: not enough sleep Sleep: insomnia  The following represents the patient's updated allergies and side effects list: Allergies  Allergen Reactions  . Penicillins Hives    Has patient had a PCN reaction causing immediate rash, facial/tongue/throat swelling, SOB or lightheadedness with hypotension: Yes Has patient had a PCN reaction causing severe rash involving mucus membranes or skin necrosis: Yes Has patient had a PCN reaction that required hospitalization: Unk Has patient had a PCN reaction occurring within the last 10 years: No If all of the above answers are "NO", then may proceed with Cephalosporin use.     The neurologically relevant items on the patient's problem list were reviewed on today's  visit.  Neurologic Examination  A problem focused neurological exam (12 or more points of the single system neurologic examination, vital signs counts as 1 point, cranial nerves count for 8 points) was performed.  Blood pressure 129/76, pulse (!) 105, weight 206 lb 6.4 oz (93.6 kg).  General - Well nourished, well developed, in no apparent distress.  Ophthalmologic - Sharp disc margins OU.   Cardiovascular - Regular rate and rhythm with no murmur.  Mental Status -  Level of arousal and orientation to time, place, and person were intact. Language including expression, naming, repetition, comprehension was assessed and found intact. Attention span and concentration were normal. Fund of Knowledge was assessed and was intact.  Cranial Nerves II - XII - II - Visual field intact OU. III, IV, VI - Extraocular movements intact. V - Facial sensation intact bilaterally. VII - Left nasolabial fold flattening. VIII - Hearing & vestibular intact bilaterally. X - Palate elevates symmetrically. XI - Chin turning & shoulder shrug intact bilaterally. XII - Tongue protrusion intact.  Motor Strength - The patient's strength was normal  in RUE and RLE, however, LUE proximal 1/5 and distally 0/5, LLE 4/5 with PFO brace.  Bulk was normal and fasciculations were absent.   Motor Tone - Muscle tone was assessed at the neck and appendages and was increased on the left UE and LE.  Reflexes - The patient's reflexes were 3+ in LUE and LLE and she had no pathological reflexes.  Sensory - Light touch, temperature/pinprick were assessed and were normal.    Coordination - The patient had normal movements in the right hand with no ataxia or dysmetria.  Tremor was absent.  Gait and Station - use hemi-walker, significant hemiparetic gait on the left    Data reviewed: I personally reviewed the images and agree with the radiology interpretations.  Ct Head Code Stroke W/o Cm 12/03/2016 1. Negative mildly  motion degraded noncontrast CT HEAD.  2. ASPECTS is 10.  Ct Angio Head W Or Wo Contrast Ct Angio Neck W Or Wo Contrast 12/03/2016 1. Proximal right M1 conclusion.  2. Expected evolution of right temporal lobe, insular cortex, and basal ganglia nonhemorrhagic infarction.  3. Focal irregularity at the right carotid bifurcation with a shelf-like soft tissue plaque but no significant stenosis relative to the more distal vessel.  4. More mild atherosclerotic changes at the left carotid bifurcation.  5. Fetal type posterior cerebral artery is bilaterally.  6. 11 mm thyroid nodule. Consider further evaluation with thyroid ultrasound. If patient is clinically hyperthyroid, consider nuclear medicine thyroid uptake and scan. Thyroid ultrasound can be done following discharge.   Mr Brain Wo Contrast 12/03/2016 Motion degraded exam.  Large RIGHT MCA territory nonhemorrhagic infarct as described.  There is early mass effect RIGHT-to-LEFT of 1-2 mm. Close follow-up recommended.  Within limits for detection on this motion degraded exam, there is no gross hemorrhage, although small areas could be overlooked. Consider CT follow-up. Suspected RIGHT MCA M1 stenosis or occlusion. This could be further assessed with CT angiography of the head and neck, but only after the patient is more cooperative.      CT Head  12/06/2016 1. Evolving acute large RIGHT MCA territory infarct without hemorrhagic conversion. 2. Increasing 5 mm RIGHT to LEFT midline shift without ventricular entrapment. 3. Dense RIGHT MCA consistent with thromboembolism.  CT Head  12/08/2016 1. Unchanged appearance of right MCA territory infarct with 6 mm of leftward midline shift. No hemorrhagic conversion. 2. No ventricular entrapment or hydrocephalus.  LE venous Doppler pending 12/05/2016 Mild technical difficulty due to size of the veins.   - No evidence of deep vein or superficial thrombosis involving the right lower extremity and left  lower extremity  TCD bubble study - no PFO at rest  TCD emboli detection - negative  Ct Head Wo Contrast 12/06/2016 IMPRESSION: 1. Evolving acute large RIGHT MCA territory infarct without hemorrhagic conversion. 2. Increasing 5 mm RIGHT to LEFT midline shift without ventricular entrapment. 3. Dense RIGHT MCA consistent with thromboembolism.   TTE - Left ventricle: The cavity size was normal. Systolic function was normal. The estimated ejection fraction was in the range of 60% to 65%. Wall motion was normal; there were no regional wall motion abnormalities. Left ventricular diastolic function parameters were normal. - Mitral valve: There was trivial regurgitation. - Pulmonic valve: There was no regurgitation.  TEE  Left Ventrical:Low normal LV function. EF 50% Mitral Valve:normal  Aortic Valve:normal  Tricuspid Valve: normal  Pulmonic Valve: normal  Left Atrium/ Left atrial appendage:no thrombi  Atrial septum:no PFO or ASD by color flow  or Doppler  Aorta:normal   EEG  1. Focal structural or other physiologic abnormality in the right hemisphere, Consistent with location of stroke. 2. diffuse cerebral dysfunction that is non-specific in etiology and can be seen with hypoxic/ischemic injury, toxic/metabolic encephalopathies, neurodegenerative disorders, or medication effect.   Ct Head Wo Contrast 12/08/2016 IMPRESSION: 1. Unchanged appearance of right MCA territory infarct with 6 mm of leftward midline shift. No hemorrhagic conversion. 2. No ventricular entrapment or hydrocephalus. Imaging by my read, no significant change of midline shift from last CT.  Ct Head Wo Contrast 06/24/2017 IMPRESSION: 1. Remote right middle cerebral artery infarct. 2.  No evidence for acute  abnormality.   [name] Wo Contrast 06/25/2017 IMPRESSION: 1. No acute intracranial process. 2. Subacute propagation of old RIGHT MCA territory infarct.   Component     Latest Ref Rng &  Units 12/03/2016 12/04/2016 12/05/2016 12/16/2016  Cholesterol     0 - 200 mg/dL  409    Triglycerides     <150 mg/dL  70    HDL Cholesterol     >40 mg/dL  41    Total CHOL/HDL Ratio     RATIO  4.7    VLDL     0 - 40 mg/dL  14    LDL (calc)     0 - 99 mg/dL  811 (H)    Alpha galactosidase, serum     28.0 - 80.0 nmol/hr/mg prt   13.2 (L)   Interpretation        Comment   Director Review        Comment   Methodology        Comment   PTT Lupus Anticoagulant     0.0 - 51.9 sec  33.4    DRVVT     0.0 - 47.0 sec  40.8    Lupus Anticoag Interp       Comment:    Beta-2 Glycoprotein I Ab, IgG     0 - 20 GPI IgG units  109 (H)    Beta-2-Glycoprotein I IgM     0 - 32 GPI IgM units  <9    Beta-2-Glycoprotein I IgA     0 - 25 GPI IgA units  16    Anticardiolipin Ab,IgG,Qn     0 - 14 GPL U/mL  <9    Anticardiolipin Ab,IgM,Qn     0 - 12 MPL U/mL  13 (H)    Anticardiolipin Ab,IgA,Qn     0 - 11 APL U/mL  <9    Hemoglobin A1C     4.8 - 5.6 % 6.0 (H) 6.2 (H)    Mean Plasma Glucose     mg/dL 914 782    HIV Screen 4th Generation wRfx     Non Reactive Non Reactive     Vitamin B12     180 - 914 pg/mL 275     RPR     Non Reactive Non Reactive     TSH     0.350 - 4.500 uIU/mL 1.119     Sed Rate     0 - 22 mm/hr 13     Antithrombin Activity     75 - 120 %  113    Protein C-Functional     73 - 180 %  191 (H)    Protein C, Total     60 - 150 %  162 (H)    Protein S-Functional     63 - 140 %  91    Protein S, Total     60 - 150 %  95    Homocysteine     0.0 - 15.0 umol/L  10.3    Recommendations-F5LEID:       Comment    Recommendations-PTGENE:       Comment    ds DNA Ab     0 - 9 IU/mL  <1    ANA Ab, IFA       TNPOLD  Negative   Component     Latest Ref Rng & Units 02/22/2017 07/03/2017  dPT     0.0 - 55.0 sec 37.8   dPT Confirm Ratio     0.00 - 1.40 Ratio 0.87   THROMBIN TIME     0.0 - 23.0 sec 15.5   PTT Lupus Anticoagulant     0.0 - 51.9 sec 33.0   DRVVT     0.0 -  47.0 sec 32.0   Lupus Anticoag Interp      Comment:   Beta-2 Glycoprotein I Ab, IgG     0 - 20 GPI IgG units >150 (H)   Beta-2 Glyco 1 IgM     0 - 32 GPI IgM units <9   Anticardiolipin Ab,IgG,Qn     0 - 14 GPL U/mL 18 (H)   Anticardiolipin Ab,IgM,Qn     0 - 12 MPL U/mL 14 (H)   Factor VIII Activity     57 - 163 % 241 (H)   Valproic Acid,S     50 - 100 ug/mL  78    Assessment: As you may recall, she is a 42 y.o. Caucasian female with PMH of migraine headaches was admitted on 7/14/8 for left-sided weakness. Found to have large right MCA infarct on MRI. MRA right M1 occlusion. CTA head and neck proximal right M1 occlusion, b/l ICA bifurcation soft plaques concerning for ulceration or intraplaque hemorrhage. TCD bubble study no PFO and emboli detection no MES. EF 60-65%. TEE unremarkable EF 50%. DVT negative. LDL 138 and A1C 6.2. Hypercoagulable work up showed beta-2-glycoprotein IgG 109, high. She was put on DAPT and high dose lipitor. She was discharge to CIR. During the interval time, the patient has been doing better, left side strength improved, able to walk with semi-walker. Finished outpt PT/OT. Able to work at home now. Repeat beta-2-glycoprotein IgG, still high > 150. 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 Of note, her son had PE 2 years ago and was diagnosed with APS, was put on coumadin but difficulty with INR, then switched to Xarelto. Pt denies any other FHx of APS. She also denies any DVT, PE or miscarriage.   on 06/22/17 she had GTC and on 06/24/17 she had staring spells. She was loaded with Keppra 1000 mg PO and followed by keppra 500mg  bid on discharge in 05/2017 but it was taered off in 06/2017 with recurrent seizure and started on depakote 750mg  bid. depakote level 78 on follow up. However, now she only takes 500mg  bid and she gained 22lbs. EEG showed right temporal region irritability but no seizure.   Due to the drug drug interaction between  depakote and coumadin as well as weight gain, we will taper off depakote and put her back on keppra 750mg  bid for seizure control. She needs INR close check as depakote has interaction with coumadin. Will also refer to Dr. Wynn Banker for left arm spastic plegia.   Plan:  - continue coumadin for stroke  prevention, INR goal 2.5-3.5 - will taper off depakote due to drug drug interaction with coumadin and weight gain side effect. Please take depakote 250mg  twice a day for one week and then 250mg  once a day for one week and then off. - start keppra 750mg  twice a day for seizure control - check INR tomorrow as scheduled, but please talk to your PCP office to check INR more closely such as every two weeks for the next 1-2 months as we stopped depakote which has interaction with coumadin - Follow up with your primary care physician for stroke risk factor modification. Recommend maintain blood pressure goal <130/80, diabetes with hemoglobin A1c goal below 7.0% and lipids with LDL cholesterol goal below 70 mg/dL.  - healthy diet and regular exercise - According to [place] law, you can not drive until seizure free for 6 months and under physician's care.  - Please maintain seizure precautions. - follow up with me on 10/18/17.  I spent more than 25 minutes of face to face time with the patient. Greater than 50% of time was spent in counseling and coordination of care. We discussed about APS and seizure treatment and medication change as well as PMR referral.    Orders Placed This Encounter  Procedures  . Ambulatory referral to Physical Medicine Rehab    Referral Priority:   Routine    Referral Type:   Rehabilitation    Referral Reason:   Specialty Services Required    Requested Specialty:   Physical Medicine and Rehabilitation    Number of Visits Requested:   1    Meds ordered this encounter  Medications  . divalproex (DEPAKOTE) 500 MG DR tablet    Sig: Take 250mg  bid for one week and then 250mg  daily for  a week and then off    Dispense:  10 tablet    Refill:  0  . levETIRAcetam (KEPPRA) 750 MG tablet    Sig: Take 1 tablet (750 mg total) by mouth 2 (two) times daily.    Dispense:  60 tablet    Refill:  2    Patient Instructions  - continue coumadin for stroke prevention, INR goal 2.5-3.5 - will taper off depakote due to drug drug interaction with coumadin and weight gain side effect. Please take depakote 250mg  twice a day for one week and then 250mg  once a day for one week and then off. - start keppra 750mg  twice a day for seizure control - check INR tomorrow as scheduled, but please talk to your PCP office to check INR more closely such as every two weeks for the next 1-2 months as we stopped depakote which has interaction with coumadin - Follow up with your primary care physician for stroke risk factor modification. Recommend maintain blood pressure goal <130/80, diabetes with hemoglobin A1c goal below 7.0% and lipids with LDL cholesterol goal below 70 mg/dL.  - healthy diet and regular exercise - According to  law, you can not drive until seizure free for 6 months and under physician's care.  - Please maintain seizure precautions. Do not participate in activities where a loss of awareness could hurt you or someone else.  No swimming alone, no tub bathing, no hot tubs, no driving, no operating motorized vehicles(cars, ATVs, motorbikes, etc), lawnmowers or power tools.  No standing at heights, such as rooftops, ladders or stairs. No sliding boards, monkey bars or swings, or climbing trees.  Avoid hot objects such as stoves, heaters, open fires.  Wear a helmet  when riding a bicycle, scooter, skateboard, etc. and avoid areas of traffic.  Set water heater to 120 degrees or less. When caring for infants or small children, sit down when holding, feeding, or changing them to minimize risk of injury to the child in the event you have a seizure. - will refer to Dr. Wynn BankerKirsteins for left arm spasticity and  weakness - follow up with me on 10/18/17.     Marvel PlanJindong Lynetta Tomczak, MD PhD [facility] [address], [address] [place], [place] [address] [phone]

## 2017-08-24 ENCOUNTER — Telehealth: Payer: Self-pay | Admitting: Neurology

## 2017-08-24 NOTE — Telephone Encounter (Signed)
That is good. Her INR goal is between 2.5 to 3.5. Currently 3.0, which is perfect. As long as the INR is at goal between 2.5 to 3.5, that is good. Please let the pt know. Thanks.   Marvel PlanJindong Wyman Meschke, MD PhD Stroke Neurology 08/24/2017 5:25 PM

## 2017-08-24 NOTE — Telephone Encounter (Signed)
Message sent to Dr. Xu for FYI.  

## 2017-08-24 NOTE — Telephone Encounter (Signed)
Pt called to advise r & r levels are 3.0.  FYI

## 2017-09-05 ENCOUNTER — Ambulatory Visit: Payer: Medicaid Other | Admitting: Physical Medicine & Rehabilitation

## 2017-09-05 ENCOUNTER — Encounter: Payer: Medicaid Other | Attending: Physical Medicine & Rehabilitation

## 2017-10-18 ENCOUNTER — Encounter: Payer: Self-pay | Admitting: Neurology

## 2017-10-18 ENCOUNTER — Ambulatory Visit: Payer: Medicaid Other | Admitting: Neurology

## 2017-10-18 VITALS — BP 116/75 | HR 89 | Wt 201.6 lb

## 2017-10-18 DIAGNOSIS — I63511 Cerebral infarction due to unspecified occlusion or stenosis of right middle cerebral artery: Secondary | ICD-10-CM

## 2017-10-18 DIAGNOSIS — G40909 Epilepsy, unspecified, not intractable, without status epilepticus: Secondary | ICD-10-CM | POA: Diagnosis not present

## 2017-10-18 DIAGNOSIS — G8110 Spastic hemiplegia affecting unspecified side: Secondary | ICD-10-CM

## 2017-10-18 DIAGNOSIS — D6861 Antiphospholipid syndrome: Secondary | ICD-10-CM

## 2017-10-18 NOTE — Patient Instructions (Signed)
-   continue coumadin for stroke prevention, INR goal 2.5-3.5 - keppra  twice a day for seizure control - Follow up with your primary care physician for stroke risk factor modification. Recommend maintain blood pressure goal <130/80, diabetes with hemoglobin A1c goal below 7.0% and lipids with LDL cholesterol goal below 70 mg/dL.  - healthy diet and regular exercise - According to [place] law, you can not drive until seizure free for 6 months and under physician's care. Last seizure in early 07/2017. - Please maintain seizure precautions. - follow up with Shanda Bumps in 3 months.

## 2017-10-18 NOTE — Progress Notes (Signed)
STROKE NEUROLOGY FOLLOW UP NOTE  NAME: Hubert Derstine DOB: [birth year]  REASON FOR VISIT: stroke follow up HISTORY FROM: pt and son and chart  Today we had the pleasure of seeing Odelia Graciano in follow-up at our Neurology Clinic. Pt was accompanied by son.   History Summary Ms. Sherrita Riederer is a 42 y.o. female with history of migraine headaches was admitted on 7/14/8 for left-sided weakness. she was healthy PTA. She was found to have large right MCA infarct on MRI. MRA right M1 occlusion. CTA head and neck proximal right M1 occlusion, b/l ICA bifurcation soft plaques concerning for ulceration or intraplaque hemorrhage. TCD bubble study no PFO and emboli detection no MES. EF 60-65%. TEE unremarkable EF 50%. DVT negative. LDL 138 and A1C 6.2. Hypercoagulable work up showed beta-2-glycoprotein IgG 109, very high. She was put on DAPT and high dose lipitor. She was discharge to CIR with LUE 0/5 and LLE 2/5. Recommend to repeat beta-2 glycoprotein in 3 months.   02/22/17 follow up - the patient has been doing better. Currently on outpt PT/OT. LLE improved much, able to walk with walker at home, but LLE still minimal movement. Able to work at home now and she likes to back to work.  Of note, she stated that her son who accompanied with him had PE 2 years ago and was diagnosed with APS, was put on coumadin but difficulty with INR, now on Xarelto. Pt denies any other FHx of APS. She also denies any DVT, PE or miscarriage.   ER visit 06/22/17 and 06/24/17 - pt went to [facility] ER on 06/22/17 for seizure activity with generalized shaking and LOC lasting 7 minutes. She had a second seizure witnessed by EMS during transport to [facility] ER. She was loaded with Keppra 1000 mg PO and followed by keppra 500mg  bid on discharge. On 06/24/17 pt back to [facility] ER again for staring spell lasting about 2-3 minutes, followed by about 3 minutes of drowsiness. Two more similar spells occurred and she was sent to ED. The  staring spells can be accompanied by upward and lateral eye deviation but there is no eyelid or eyeball twitching. No limb jerking during the spells - she is "out of it" and not responsive to voice during the spells. Dr. Otelia Limes tapered off her keppra and started on depakote 750mg  bid after loading dose.   06/23/17 follow up (PS) - Patient is seen emergently today as Dr. Roda Shutters is out of the office and patient was recently seen in the hospital for recurrent seizures on 06/25/17. I have reviewed hospital electronic medical records as well as imaging films.She had a witnessed generalized tonic clonic seizure at home which lasted about 7 minutes. EMS was called and she had a second witnessed seizure during transport to [facility]. She was loaded with Keppra 1 g followed by another gram the next day and discharged home. She slept the whole of next day and had another episode of staring spell lasting to 3 minutes 2 days later followed by some drowsiness. She had 2 similar episodes and presented to the ED at [facility]. She was started on Depakote by Dr. Otelia Limes as he felt Keppra was not being effective and he advised her to taper and stop the Keppra. MRI scan of the brain was obtained which showed old right MCA infarct but no acute findings. Patient states she is tolerating Depakote without any nausea, diarrhea, dizziness or tremors. She states however she has had one more generalized  seizure since leaving the hospital and one more episode of staring spell. She is currently on only 1500 mg of Depakote and she has not had levels checked since Depakote was started a week ago. 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 She is tolerating aspirin without bleeding or bruising. She states her blood pressure is well controlled and today it is 130/65. She is tolerating Coumadin well without bleeding or bruising for her antiphospholipid antibody syndrome.  08/21/17 follow up - pt has  been doing well without seizure or stroke recurrence. She told me that she is on depakote  bid now but on the drug bottle it showed  daily but Dr. Pearlean Brownie last visit asked her to take  bid. Her depakote level last visit in 06/2017 was 78. Her EEG 07/06/17 showed mild slow background and focal right temporal cortical irritability. She still on coumadin and last INR 2.8. She will has scheduled INR check tomorrow with her PCP. She told me that she has gained 22lbs. For the last two months. She still has left sided weakness, able to walk slowly with hemiwalker. Left arm still spastic and not able to move. Left shoulder pain on movement is significant. She said her PCP Dr. Marina Goodell did shoulder injection once last year for her. BP today 129/76.  Interval History During the interval time, pt has been doing well. No stroke like symptoms. Had INR check recently and was told within target range but pt can not remember the value. She continues to have outpt PT/OT at [facility] but so far still very limited left shoulder ROM with pain on movement. She got cortisone shot with PCP Dr. Marina Goodell. As per daughter in law, her last seizure was at the end of 06/2017 with 1-2 min staring spells. No more seizure since then. Off depakote and now on keppra, tolerating well. However, complains of significant hair loss although no alopecia. I checked potential side effects of her medications, keppra does have < 1% chance of alopecia listed as AE.    REVIEW OF SYSTEMS: Full 14 system review of systems performed and notable only for those listed below and in HPI above, all others are negative:  Constitutional:  Cardiovascular:  Ear/Nose/Throat:   Skin:  Eyes:   Respiratory:   Gastroitestinal:   Genitourinary:  Hematology/Lymphatic:    Endocrine:  Musculoskeletal:   Allergy/Immunology:   Neurological:  weakness Psychiatric: not enough sleep Sleep: insomnia  The following represents the patient's updated  allergies and side effects list: Allergies  Allergen Reactions  . Penicillins Hives    Has patient had a PCN reaction causing immediate rash, facial/tongue/throat swelling, SOB or lightheadedness with hypotension: Yes Has patient had a PCN reaction causing severe rash involving mucus membranes or skin necrosis: Yes Has patient had a PCN reaction that required hospitalization: Unk Has patient had a PCN reaction occurring within the last 10 years: No If all of the above answers are "NO", then may proceed with Cephalosporin use.     The neurologically relevant items on the patient's problem list were reviewed on today's visit.  Neurologic Examination  A problem focused neurological exam (12 or more points of the single system neurologic examination, vital signs counts as 1 point, cranial nerves count for 8 points) was performed.  Blood pressure 116/75, pulse 89, weight 201 lb 9.6 oz (91.4 kg).  General - Well nourished, well developed, in no apparent distress.  Ophthalmologic - Sharp disc margins OU.  Cardiovascular - Regular rate and rhythm with no murmur.  Mental Status -  Level of arousal and orientation to time, place, and person were intact. Language including expression, naming, repetition, comprehension was assessed and found intact. Attention span and concentration were normal. Fund of Knowledge was assessed and was intact.  Cranial Nerves II - XII - II - Visual field intact OU. III, IV, VI - Extraocular movements intact. V - Facial sensation intact bilaterally. VII - Left nasolabial fold flattening. VIII - Hearing & vestibular intact bilaterally. X - Palate elevates symmetrically. XI - Chin turning & shoulder shrug intact bilaterally. XII - Tongue protrusion intact.  Motor Strength - The patient's strength was normal in RUE and RLE, however, LUE proximal and distally 0/5 with significantly limited left shoulder ROM and pain on movment, LLE 4/5 with PFO brace.  Bulk  was normal and fasciculations were absent.   Motor Tone - Muscle tone was assessed at the neck and appendages and was increased on the left UE and LE.  Reflexes - The patient's reflexes were 3+ in LUE and LLE and she had no pathological reflexes.  Sensory - Light touch, temperature/pinprick were assessed and were normal.    Coordination - The patient had normal movements in the right hand with no ataxia or dysmetria.  Tremor was absent.  Gait and Station - walk with cane, significant hemiparetic gait on the left    Data reviewed: I personally reviewed the images and agree with the radiology interpretations.  Ct Head Code Stroke W/o Cm 12/03/2016 1. Negative mildly motion degraded noncontrast CT HEAD.  2. ASPECTS is 10.  Ct Angio Head W Or Wo Contrast Ct Angio Neck W Or Wo Contrast 12/03/2016 1. Proximal right M1 conclusion.  2. Expected evolution of right temporal lobe, insular cortex, and basal ganglia nonhemorrhagic infarction.  3. Focal irregularity at the right carotid bifurcation with a shelf-like soft tissue plaque but no significant stenosis relative to the more distal vessel.  4. More mild atherosclerotic changes at the left carotid bifurcation.  5. Fetal type posterior cerebral artery is bilaterally.  6. 11 mm thyroid nodule. Consider further evaluation with thyroid ultrasound. If patient is clinically hyperthyroid, consider nuclear medicine thyroid uptake and scan. Thyroid ultrasound can be done following discharge.   Mr Brain Wo Contrast 12/03/2016 Motion degraded exam.  Large RIGHT MCA territory nonhemorrhagic infarct as described.  There is early mass effect RIGHT-to-LEFT of 1-2 mm. Close follow-up recommended.  Within limits for detection on this motion degraded exam, there is no gross hemorrhage, although small areas could be overlooked. Consider CT follow-up. Suspected RIGHT MCA M1 stenosis or occlusion. This could be further assessed with CT angiography of the head  and neck, but only after the patient is more cooperative.      CT Head  12/06/2016 1. Evolving acute large RIGHT MCA territory infarct without hemorrhagic conversion. 2. Increasing 5 mm RIGHT to LEFT midline shift without ventricular entrapment. 3. Dense RIGHT MCA consistent with thromboembolism.  CT Head  12/08/2016 1. Unchanged appearance of right MCA territory infarct with 6 mm of leftward midline shift. No hemorrhagic conversion. 2. No ventricular entrapment or hydrocephalus.  LE venous Doppler pending 12/05/2016 Mild technical difficulty due to size of the veins.   - No evidence of deep vein or superficial thrombosis involving the right lower extremity and left lower extremity  TCD bubble study - no PFO at rest  TCD emboli detection - negative  Ct Head Wo Contrast 12/06/2016 IMPRESSION:  1. Evolving acute large RIGHT MCA territory infarct without hemorrhagic conversion. 2. Increasing 5 mm RIGHT to LEFT midline shift without ventricular entrapment. 3. Dense RIGHT MCA consistent with thromboembolism.   TTE - Left ventricle: The cavity size was normal. Systolic function was normal. The estimated ejection fraction was in the range of 60% to 65%. Wall motion was normal; there were no regional wall motion abnormalities. Left ventricular diastolic function parameters were normal. - Mitral valve: There was trivial regurgitation. - Pulmonic valve: There was no regurgitation.  TEE  Left Ventrical:Low normal LV function. EF 50% Mitral Valve:normal  Aortic Valve:normal  Tricuspid Valve: normal  Pulmonic Valve: normal  Left Atrium/ Left atrial appendage:no thrombi  Atrial septum:no PFO or ASD by color flow or Doppler  Aorta:normal   EEG  1. Focal structural or other physiologic abnormality in the right hemisphere, Consistent with location of stroke. 2. diffuse cerebral dysfunction that is non-specific in etiology and can be seen with hypoxic/ischemic  injury, toxic/metabolic encephalopathies, neurodegenerative disorders, or medication effect.   Ct Head Wo Contrast 12/08/2016 IMPRESSION: 1. Unchanged appearance of right MCA territory infarct with 6 mm of leftward midline shift. No hemorrhagic conversion. 2. No ventricular entrapment or hydrocephalus. Imaging by my read, no significant change of midline shift from last CT.  Ct Head Wo Contrast 06/24/2017 IMPRESSION: 1. Remote right middle cerebral artery infarct. 2.  No evidence for acute  abnormality.   [name] Wo Contrast 06/25/2017 IMPRESSION: 1. No acute intracranial process. 2. Subacute propagation of old RIGHT MCA territory infarct.   Component     Latest Ref Rng & Units 12/03/2016 12/04/2016 12/05/2016 12/16/2016  Cholesterol     0 - 200 mg/dL  161    Triglycerides     <150 mg/dL  70    HDL Cholesterol     >40 mg/dL  41    Total CHOL/HDL Ratio     RATIO  4.7    VLDL     0 - 40 mg/dL  14    LDL (calc)     0 - 99 mg/dL  096 (H)    Alpha galactosidase, serum     28.0 - 80.0 nmol/hr/mg prt   13.2 (L)   Interpretation        Comment   Director Review        Comment   Methodology        Comment   PTT Lupus Anticoagulant     0.0 - 51.9 sec  33.4    DRVVT     0.0 - 47.0 sec  40.8    Lupus Anticoag Interp       Comment:    Beta-2 Glycoprotein I Ab, IgG     0 - 20 GPI IgG units  109 (H)    Beta-2-Glycoprotein I IgM     0 - 32 GPI IgM units  <9    Beta-2-Glycoprotein I IgA     0 - 25 GPI IgA units  16    Anticardiolipin Ab,IgG,Qn     0 - 14 GPL U/mL  <9    Anticardiolipin Ab,IgM,Qn     0 - 12 MPL U/mL  13 (H)    Anticardiolipin Ab,IgA,Qn     0 - 11 APL U/mL  <9    Hemoglobin A1C     4.8 - 5.6 % 6.0 (H) 6.2 (H)    Mean Plasma Glucose     mg/dL 045 409    HIV Screen 4th Generation  wRfx     Non Reactive Non Reactive     Vitamin B12     180 - 914 pg/mL 275     RPR     Non Reactive Non Reactive     TSH     0.350 - 4.500 uIU/mL 1.119     Sed Rate     0 - 22 mm/hr  13     Antithrombin Activity     75 - 120 %  113    Protein C-Functional     73 - 180 %  191 (H)    Protein C, Total     60 - 150 %  162 (H)    Protein S-Functional     63 - 140 %  91    Protein S, Total     60 - 150 %  95    Homocysteine     0.0 - 15.0 umol/L  10.3    Recommendations-F5LEID:       Comment    Recommendations-PTGENE:       Comment    ds DNA Ab     0 - 9 IU/mL  <1    ANA Ab, IFA       TNPOLD  Negative   Component     Latest Ref Rng & Units 02/22/2017 07/03/2017  dPT     0.0 - 55.0 sec 37.8   dPT Confirm Ratio     0.00 - 1.40 Ratio 0.87   THROMBIN TIME     0.0 - 23.0 sec 15.5   PTT Lupus Anticoagulant     0.0 - 51.9 sec 33.0   DRVVT     0.0 - 47.0 sec 32.0   Lupus Anticoag Interp      Comment:   Beta-2 Glycoprotein I Ab, IgG     0 - 20 GPI IgG units >150 (H)   Beta-2 Glyco 1 IgM     0 - 32 GPI IgM units <9   Anticardiolipin Ab,IgG,Qn     0 - 14 GPL U/mL 18 (H)   Anticardiolipin Ab,IgM,Qn     0 - 12 MPL U/mL 14 (H)   Factor VIII Activity     57 - 163 % 241 (H)   Valproic Acid,S     50 - 100 ug/mL  78    Assessment: As you may recall, she is a 42 y.o. Caucasian female with PMH of migraine headaches was admitted on 12/03/16 for left-sided weakness. Found to have large right MCA infarct on MRI. MRA right M1 occlusion. CTA head and neck proximal right M1 occlusion, b/l ICA bifurcation soft plaques concerning for ulceration or intraplaque hemorrhage. TCD bubble study no PFO and emboli detection no MES. EF 60-65%. TEE unremarkable EF 50%. DVT negative. LDL 138 and A1C 6.2. Hypercoagulable work up showed beta-2-glycoprotein IgG 109, high. She was put on DAPT and high dose lipitor. She was discharge to CIR. During the interval time, the patient has been doing better, left side strength improved, able to walk with semi-walker. Finished outpt PT/OT. Able to work at home now. Repeat beta-2-glycoprotein IgG, still high > 150. APS diagnosis confirmed and she was started  on coumadin with INR 2.5-3.5. Of note, her son had PE 2 years ago and was diagnosed with APS, was put on coumadin but difficulty with INR, then switched to Xarelto. Pt denies any other FHx of APS. She also denies any DVT, PE or miscarriage.   on 06/22/17 she had GTC and on  06/24/17 she had staring spells. She was loaded with Keppra 1000 mg PO and followed by keppra  bid on discharge in 05/2017 but it was taered off in 06/2017 with recurrent seizure and started on depakote  bid. depakote level 78 on follow up. However, now she only takes  bid and she gained 22lbs. EEG showed right temporal region irritability but no seizure. Depakote was tapered off and put her back on keppra  bid for seizure control.   She continues to have outpt PT/OT at [facility] but so far still very limited left shoulder ROM with pain on movement. Her last seizure was at the end of 06/2017 with 1-2 min staring spells. No more seizure since then. However, complains of significant hair loss although no alopecia. Of note, keppra does have < 1% chance of alopecia listed as AE.   Plan:  - continue coumadin for stroke prevention, INR goal 2.5-3.5 - keppra  twice a day for seizure control - Follow up with your primary care physician for stroke risk factor modification. Recommend maintain blood pressure goal <130/80, diabetes with hemoglobin A1c goal below 7.0% and lipids with LDL cholesterol goal below 70 mg/dL.  - healthy diet and regular exercise - According to [place] law, you can not drive until seizure free for 6 months and under physician's care. Last seizure in early 07/2017. - Please maintain seizure precautions. - follow up with Shanda Bumps in 3 months.   I spent more than 25 minutes of face to face time with the patient. Greater than 50% of time was spent in counseling and coordination of care. We discussed about seizure control, hair loss and continue coumadin.    No orders of the defined types were placed  in this encounter.   No orders of the defined types were placed in this encounter.   Patient Instructions  - continue coumadin for stroke prevention, INR goal 2.5-3.5 - keppra  twice a day for seizure control - Follow up with your primary care physician for stroke risk factor modification. Recommend maintain blood pressure goal <130/80, diabetes with hemoglobin A1c goal below 7.0% and lipids with LDL cholesterol goal below 70 mg/dL.  - healthy diet and regular exercise - According to  law, you can not drive until seizure free for 6 months and under physician's care. Last seizure in early 07/2017. - Please maintain seizure precautions. - follow up with Shanda Bumps in 3 months.    Marvel Plan, MD PhD [facility] [address], [address] [place], [place] [address] [phone]

## 2017-11-14 ENCOUNTER — Other Ambulatory Visit: Payer: Self-pay | Admitting: Neurology

## 2017-12-14 DIAGNOSIS — Z0271 Encounter for disability determination: Secondary | ICD-10-CM

## 2017-12-21 DIAGNOSIS — E860 Dehydration: Secondary | ICD-10-CM

## 2017-12-21 DIAGNOSIS — D6861 Antiphospholipid syndrome: Secondary | ICD-10-CM

## 2017-12-21 DIAGNOSIS — R569 Unspecified convulsions: Secondary | ICD-10-CM

## 2017-12-21 DIAGNOSIS — M7989 Other specified soft tissue disorders: Secondary | ICD-10-CM

## 2017-12-21 DIAGNOSIS — R079 Chest pain, unspecified: Secondary | ICD-10-CM

## 2018-01-18 ENCOUNTER — Ambulatory Visit: Payer: Medicaid Other | Admitting: Adult Health

## 2018-02-12 ENCOUNTER — Encounter: Payer: Self-pay | Admitting: Cardiology

## 2018-02-12 DIAGNOSIS — I639 Cerebral infarction, unspecified: Secondary | ICD-10-CM | POA: Insufficient documentation

## 2018-02-12 DIAGNOSIS — I10 Essential (primary) hypertension: Secondary | ICD-10-CM | POA: Insufficient documentation

## 2018-02-12 DIAGNOSIS — I152 Hypertension secondary to endocrine disorders: Secondary | ICD-10-CM | POA: Insufficient documentation

## 2018-02-17 ENCOUNTER — Other Ambulatory Visit: Payer: Self-pay | Admitting: Adult Health

## 2018-02-26 ENCOUNTER — Ambulatory Visit: Payer: Self-pay | Admitting: Cardiology

## 2018-03-06 ENCOUNTER — Telehealth: Payer: Self-pay

## 2018-03-06 NOTE — Telephone Encounter (Signed)
RN call patient about her refills for keppra from our office. RN stated a refill was sent to our office last month and the work in md did 3 months of refills. Pt stated she did cancel her appt with Shanda Bumps NP in 12/2017. PT stated her Dr. Brent Bulla will be handling all of her medications. Rn ask if her PCP will be managing her stroke and seizure condition. Pt stated her PCP just refill her seizure medication. The pt stated she did not wish to reschedule at this time.She states its hard to find a ride to our office. She states all the refills will be going to her PCP and not [place].The patient appreciate the call.

## 2018-03-07 NOTE — Telephone Encounter (Signed)
Noted! Thank you

## 2018-03-07 NOTE — Telephone Encounter (Signed)
See phone note from 03/06/2018. Pt states her PCP is doing her refills and managing her keppra.

## 2018-03-26 ENCOUNTER — Ambulatory Visit (INDEPENDENT_AMBULATORY_CARE_PROVIDER_SITE_OTHER): Payer: Medicaid Other | Admitting: Cardiology

## 2018-03-26 ENCOUNTER — Encounter: Payer: Self-pay | Admitting: Cardiology

## 2018-03-26 VITALS — BP 98/62 | HR 89 | Ht 63.0 in | Wt 208.0 lb

## 2018-03-26 DIAGNOSIS — R931 Abnormal findings on diagnostic imaging of heart and coronary circulation: Secondary | ICD-10-CM | POA: Insufficient documentation

## 2018-03-26 DIAGNOSIS — I10 Essential (primary) hypertension: Secondary | ICD-10-CM | POA: Diagnosis not present

## 2018-03-26 DIAGNOSIS — I63511 Cerebral infarction due to unspecified occlusion or stenosis of right middle cerebral artery: Secondary | ICD-10-CM | POA: Diagnosis not present

## 2018-03-26 DIAGNOSIS — D6861 Antiphospholipid syndrome: Secondary | ICD-10-CM

## 2018-03-26 DIAGNOSIS — E782 Mixed hyperlipidemia: Secondary | ICD-10-CM | POA: Diagnosis not present

## 2018-03-26 MED ORDER — METOPROLOL TARTRATE 50 MG PO TABS: 50.0000 mg | ORAL_TABLET | Freq: Once | ORAL | 0 refills | Status: DC

## 2018-03-26 NOTE — Progress Notes (Signed)
Cardiology Office Note:    Date:  03/26/2018   ID:  Shannon Erickson, DOB 1976/02/25, MRN [id number]  PCP:  Abigail Miyamoto, MD  Cardiologist:  Garwin Brothers, MD   Referring MD: Abigail Miyamoto,*    ASSESSMENT:    1. Abnormal nuclear cardiac imaging test   2. Mixed hyperlipidemia   3. Essential hypertension   4. 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 Antiphospholipid syndrome (HCC)    PLAN:    In order of problems listed above:  1. Primary prevention stressed with the patient.  Importance of compliance with diet and medication stressed and she vocalized understanding.  Lipids are followed by her primary care physician.  She is on anticoagulation for antiphospholipid lipid syndrome and this is medically followed by primary care. 2. Patient is currently asymptomatic but she has multiple risk factors for coronary artery disease and leads a sedentary lifestyle.  In view of her abnormal stress test I will set her up for coronary CT angiography.  If this is negative then she will be seen only in follow-up on a as needed basis.  She knows to go to the nearest emergency room for any concerning symptoms.   Medication Adjustments/Labs and Tests Ordered: Current medicines are reviewed at length with the patient today.  Concerns regarding medicines are outlined above.  No orders of the defined types were placed in this encounter.  No orders of the defined types were placed in this encounter.    History of Present Illness:    Jensen Cheramie is a 42 y.o. female who is being seen today for the evaluation of abnormal nuclear stress test at the request of Abigail Miyamoto,*.  Patient is a pleasant 42 year old female.  She has past medical history of antiphospholipid antibody syndrome, history of stroke, essential hypertension and dyslipidemia.  She underwent stress testing and this was abnormal.  She has stroke and ambulates minimally.  She  denies any chest pain orthopnea or PND.  For this reason of abnormal stress test she was referred here.  Her daughter-in-law is supportive and accompanies her for this visit.  At the time of my evaluation, the patient is alert awake oriented and in no distress.  Past Medical History:  Diagnosis Date  . Hypertension   . Lesion of left ulnar nerve   . Meningitis 1995  . Migraine   . Seizures (HCC)   . Stroke [facility])     Past Surgical History:  Procedure Laterality Date  . CESAREAN SECTION Bilateral 1995, 2002  . CHOLECYSTECTOMY    . LYMPH NODE BIOPSY    . NO PAST SURGERIES    . TEE WITHOUT CARDIOVERSION N/A 12/05/2016   Procedure: TRANSESOPHAGEAL ECHOCARDIOGRAM (TEE);  Surgeon: [name], MD;  Location: [facility] ENDOSCOPY;  Service: Cardiovascular;  Laterality: N/A;    Current Medications: Current Meds  Medication Sig  . atorvastatin (LIPITOR) 80 MG tablet Take 1 tablet (80 mg total) by mouth daily at 6 PM.  . gabapentin (NEURONTIN) 300 MG capsule Take 300 mg by mouth 2 (two) times daily.  Marland Kitchen levETIRAcetam (KEPPRA) 750 MG tablet TAKE 1 TABLET(750 MG) BY MOUTH TWICE DAILY  . nitroGLYCERIN (NITROSTAT) 0.4 MG SL tablet Place 1 tablet under the tongue every 5 (five) minutes as needed.  . sertraline (ZOLOFT) 25 MG tablet Take 25 mg by mouth at bedtime.  Marland Kitchen warfarin (COUMADIN) 1 MG tablet Take 1 mg by mouth See admin instructions. Take 1 tablet (  1 mg) by mouth with a 6 mg tablet for a total dose of 7 mg - daily at 6pm  . warfarin (COUMADIN) 6 MG tablet Take 6 mg by mouth See admin instructions. Take 1 tablet (6 mg) by mouth with a 1 mg tablet for a total dose of 7 mg - daily at 6pm     Allergies:   Penicillins   Social History   Socioeconomic History  . Marital status: Single    Spouse name: Not on file  . Number of children: 3  . Years of education: 37  . Highest education level: Not on file  Occupational History  . Not on file  Social Needs  . Financial resource strain: Not on  file  . Food insecurity:    Worry: Not on file    Inability: Not on file  . Transportation needs:    Medical: Not on file    Non-medical: Not on file  Tobacco Use  . Smoking status: Never Smoker  . Smokeless tobacco: Never Used  Substance and Sexual Activity  . Alcohol use: No  . Drug use: No  . Sexual activity: Not on file  Lifestyle  . Physical activity:    Days per week: Not on file    Minutes per session: Not on file  . Stress: Not on file  Relationships  . Social connections:    Talks on phone: Not on file    Gets together: Not on file    Attends religious service: Not on file    Active member of club or organization: Not on file    Attends meetings of clubs or organizations: Not on file    Relationship status: Not on file  Other Topics Concern  . Not on file  Social History Narrative  . Not on file     Family History: The patient's family history includes Heart attack in her brother and paternal grandfather; Hypertension in her father; Lung cancer in her mother; Renal cancer in her maternal grandfather.  ROS:   Please see the history of present illness.    All other systems reviewed and are negative.  EKGs/Labs/Other Studies Reviewed:    The following studies were reviewed today: I discussed my findings with the patient at length EKG reveals sinus rhythm and nonspecific ST-T changes.   Recent Labs: 06/24/2017: ALT 42; BUN 9; Creatinine, Ser 0.80; Hemoglobin 11.9; Platelets 350; Potassium 3.8; Sodium 141 06/25/2017: Magnesium 1.8  Recent Lipid Panel    Component Value Date/Time   CHOL 193 12/04/2016 0419   TRIG 70 12/04/2016 0419   HDL 41 12/04/2016 0419   CHOLHDL 4.7 12/04/2016 0419   VLDL 14 12/04/2016 0419   LDLCALC 138 (H) 12/04/2016 0419    Physical Exam:    VS:  BP 98/62 (BP Location: Right Arm, Patient Position: Sitting, Cuff Size: Normal)   Pulse 89   Ht 5\' 3"  (1.6 m)   Wt 208 lb (94.3 kg)   SpO2 98%   BMI 36.85 kg/m     Wt Readings from  Last 3 Encounters:  03/26/18 208 lb (94.3 kg)  10/18/17 201 lb 9.6 oz (91.4 kg)  08/21/17 206 lb 6.4 oz (93.6 kg)     GEN: Patient is in no acute distress HEENT: Normal NECK: No JVD; No carotid bruits LYMPHATICS: No lymphadenopathy CARDIAC: S1 S2 regular, 2/6 systolic murmur at the apex. RESPIRATORY:  Clear to auscultation without rales, wheezing or rhonchi  ABDOMEN: Soft, non-tender, non-distended MUSCULOSKELETAL:  No edema;  No deformity  SKIN: Warm and dry NEUROLOGIC:  Alert and oriented x 3 PSYCHIATRIC:  Normal affect    Signed, Garwin Brothers, MD  03/26/2018 11:22 AM    [facility]

## 2018-03-26 NOTE — Patient Instructions (Addendum)
Medication Instructions:  Your physician recommends that you continue on your current medications as directed. Please refer to the Current Medication list given to you today.  If you need a refill on your cardiac medications before your next appointment, please call your pharmacy.   Lab work: Your physician recommends that you have the following labs drawn: BMP to be done at least 1 week prior to cardiac imaging.  If you have labs (blood work) drawn today and your tests are completely normal, you will receive your results only by: Marland Kitchen MyChart Message (if you have MyChart) OR . A paper copy in the mail If you have any lab test that is abnormal or we need to change your treatment, we will call you to review the results.   Testing/Procedures: Please arrive at the [facility] main entrance of [facility] at xx:xx AM (30-45 minutes prior to test start time) You will be called with an appointment date and time.   [facility] [address] [place], [place] [address] [phone]  Proceed to the [facility] Radiology Department (First Floor).  Please follow these instructions carefully (unless otherwise directed):  On the Night Before the Test: . Be sure to Drink plenty of water. . Do not consume any caffeinated/decaffeinated beverages or chocolate 12 hours prior to your test. . Do not take any antihistamines 12 hours prior to your test.  On the Day of the Test: . Drink plenty of water. Do not drink any water within one hour of the test. . Do not eat any food 4 hours prior to the test. . You may take your regular medications prior to the test.  Take metoprolol (Lopressor) two hours prior to test. HOLD Furosemide/Hydrochlorothiazide morning of the test.                 -If HR is less than 55 BPM- No Beta Blocker                -IF HR is greater than 55 BPM- metoprolol 100 mg once      After the Test: . Drink plenty of water. . After receiving IV contrast, you may  experience a mild flushed feeling. This is normal. . On occasion, you may experience a mild rash up to 24 hours after the test. This is not dangerous. If this occurs, you can take Benadryl 25 mg and increase your fluid intake. . If you experience trouble breathing, this can be serious. If it is severe call 911 IMMEDIATELY. If it is mild, please call our office. . If you take any of these medications: Glipizide/Metformin, Avandament, Glucavance, please do not take 48 hours after completing test.   Follow-Up: At [facility], you and your health needs are our priority.  As part of our continuing mission to provide you with exceptional heart care, we have created designated Provider Care Teams.  These Care Teams include your primary Cardiologist (physician) and Advanced Practice Providers (APPs -  Physician Assistants and Nurse Practitioners) who all work together to provide you with the care you need, when you need it.  You will need a follow up appointment in as needed.  Please call our office 2 months in advance to schedule this appointment.  You may see or another member of our [employer] Provider Team in [place]: Gypsy Balsam, MD . Norman Herrlich, MD  Any Other Special Instructions Will Be Listed Below (If Applicable).  Cardiac CT Angiogram A cardiac CT angiogram is  a procedure to look at the heart and the area around the heart. It may be done to help find the cause of chest pains or other symptoms of heart disease. During this procedure, a large X-ray machine, called a CT scanner, takes detailed pictures of the heart and the surrounding area after a dye (contrast material) has been injected into blood vessels in the area. The procedure is also sometimes called a coronary CT angiogram, coronary artery scanning, or CTA. A cardiac CT angiogram allows the health care provider to see how well blood is flowing to and from the heart. The health care provider will be able to see if there are any  problems, such as:  Blockage or narrowing of the coronary arteries in the heart.  Fluid around the heart.  Signs of weakness or disease in the muscles, valves, and tissues of the heart.  Tell a health care provider about:  Any allergies you have. This is especially important if you have had a previous allergic reaction to contrast dye.  All medicines you are taking, including vitamins, herbs, eye drops, creams, and over-the-counter medicines.  Any blood disorders you have.  Any surgeries you have had.  Any medical conditions you have.  Whether you are pregnant or may be pregnant.  Any anxiety disorders, chronic pain, or other conditions you have that may increase your stress or prevent you from lying still. What are the risks? Generally, this is a safe procedure. However, problems may occur, including:  Bleeding.  Infection.  Allergic reactions to medicines or dyes.  Damage to other structures or organs.  Kidney damage from the dye or contrast that is used.  Increased risk of cancer from radiation exposure. This risk is low. Talk with your health care provider about: ? The risks and benefits of testing. ? How you can receive the lowest dose of radiation.  What happens before the procedure?  Wear comfortable clothing and remove any jewelry, glasses, dentures, and hearing aids.  Follow instructions from your health care provider about eating and drinking. This may include: ? For 12 hours before the test - avoid caffeine. This includes tea, coffee, soda, energy drinks, and diet pills. Drink plenty of water or other fluids that do not have caffeine in them. Being well-hydrated can prevent complications. ? For 4-6 hours before the test - stop eating and drinking. The contrast dye can cause nausea, but this is less likely if your stomach is empty.  Ask your health care provider about changing or stopping your regular medicines. This is especially important if you are taking  diabetes medicines, blood thinners, or medicines to treat erectile dysfunction. What happens during the procedure?  Hair on your chest may need to be removed so that small sticky patches called electrodes can be placed on your chest. These will transmit information that helps to monitor your heart during the test.  An IV tube will be inserted into one of your veins.  You might be given a medicine to control your heart rate during the test. This will help to ensure that good images are obtained.  You will be asked to lie on an exam table. This table will slide in and out of the CT machine during the procedure.  Contrast dye will be injected into the IV tube. You might feel warm, or you may get a metallic taste in your mouth.  You will be given a medicine (nitroglycerin) to relax (dilate) the arteries in your heart.  The table  that you are lying on will move into the CT machine tunnel for the scan.  The person running the machine will give you instructions while the scans are being done. You may be asked to: ? Keep your arms above your head. ? Hold your breath. ? Stay very still, even if the table is moving.  When the scanning is complete, you will be moved out of the machine.  The IV tube will be removed. The procedure may vary among health care providers and hospitals. What happens after the procedure?  You might feel warm, or you may get a metallic taste in your mouth from the contrast dye.  You may have a headache from the nitroglycerin.  After the procedure, drink water or other fluids to wash (flush) the contrast material out of your body.  Contact a health care provider if you have any symptoms of allergy to the contrast. These symptoms include: ? Shortness of breath. ? Rash or hives. ? A racing heartbeat.  Most people can return to their normal activities right after the procedure. Ask your health care provider what activities are safe for you.  It is up to you to get  the results of your procedure. Ask your health care provider, or the department that is doing the procedure, when your results will be ready. Summary  A cardiac CT angiogram is a procedure to look at the heart and the area around the heart. It may be done to help find the cause of chest pains or other symptoms of heart disease.  During this procedure, a large X-ray machine, called a CT scanner, takes detailed pictures of the heart and the surrounding area after a dye (contrast material) has been injected into blood vessels in the area.  Ask your health care provider about changing or stopping your regular medicines before the procedure. This is especially important if you are taking diabetes medicines, blood thinners, or medicines to treat erectile dysfunction.  After the procedure, drink water or other fluids to wash (flush) the contrast material out of your body. This information is not intended to replace advice given to you by your health care provider. Make sure you discuss any questions you have with your health care provider. Document Released: 04/21/2008 Document Revised: 03/28/2016 Document Reviewed: 03/28/2016 Elsevier Interactive Patient Education  2017 [employer].

## 2018-03-26 NOTE — Addendum Note (Signed)
Addended by: Craige Cotta on: 03/26/2018 11:35 AM   Modules accepted: Orders

## 2018-06-01 ENCOUNTER — Telehealth (HOSPITAL_COMMUNITY): Payer: Self-pay | Admitting: Emergency Medicine

## 2018-06-01 NOTE — Telephone Encounter (Signed)
Reaching out to patient to offer assistance regarding upcoming cardiac imaging study; pt verbalizes understanding of appt date/time, parking situation and where to check in, pre-test NPO status and medications ordered, and verified current allergies; name and call back number provided for further questions should they arise Izeah Vossler RN Navigator Cardiac Imaging [phone] 

## 2018-06-04 ENCOUNTER — Ambulatory Visit (HOSPITAL_COMMUNITY): Admission: RE | Admit: 2018-06-04 | Payer: Medicaid Other | Source: Ambulatory Visit

## 2018-06-04 ENCOUNTER — Ambulatory Visit (HOSPITAL_COMMUNITY)
Admission: RE | Admit: 2018-06-04 | Discharge: 2018-06-04 | Disposition: A | Discharge status: Home / Self Care | Payer: Self-pay | Source: Ambulatory Visit | Attending: Cardiology | Admitting: Cardiology

## 2018-06-04 ENCOUNTER — Encounter (HOSPITAL_COMMUNITY): Payer: Self-pay

## 2018-06-04 ENCOUNTER — Telehealth: Payer: Self-pay

## 2018-06-04 DIAGNOSIS — R079 Chest pain, unspecified: Secondary | ICD-10-CM

## 2018-06-04 DIAGNOSIS — R931 Abnormal findings on diagnostic imaging of heart and coronary circulation: Secondary | ICD-10-CM | POA: Insufficient documentation

## 2018-06-04 MED ORDER — METOPROLOL TARTRATE 5 MG/5ML IV SOLN
INTRAVENOUS | Status: AC
  Administered 2018-06-04: 5 mg via INTRAVENOUS
  Filled 2018-06-04: qty 10

## 2018-06-04 MED ORDER — NITROGLYCERIN 0.4 MG SL SUBL
SUBLINGUAL_TABLET | SUBLINGUAL | Status: AC
  Administered 2018-06-04: 0.8 mg via SUBLINGUAL
  Filled 2018-06-04: qty 2

## 2018-06-04 MED ORDER — METOPROLOL TARTRATE 5 MG/5ML IV SOLN
5.0000 mg | INTRAVENOUS | Status: DC | PRN
  Administered 2018-06-04: 5 mg via INTRAVENOUS
  Filled 2018-06-04: qty 5

## 2018-06-04 MED ORDER — NITROGLYCERIN 0.4 MG SL SUBL
0.8000 mg | SUBLINGUAL_TABLET | Freq: Once | SUBLINGUAL | Status: AC
  Administered 2018-06-04: 0.8 mg via SUBLINGUAL
  Filled 2018-06-04: qty 25

## 2018-06-04 MED ORDER — IOPAMIDOL (ISOVUE-370) INJECTION 76%
80.0000 mL | Freq: Once | INTRAVENOUS | Status: AC | PRN
  Administered 2018-06-04: 80 mL via INTRAVENOUS

## 2018-06-04 NOTE — Telephone Encounter (Signed)
Pt informed of normal Cardiac CT results per Dr. Tomie China.

## 2018-06-05 IMAGING — CT CT HEAD W/O CM
4 series · 16 of 47 positions shown, 18 images · non-contrast
Comparison: CT HEAD December 03, 2016

CLINICAL DATA: Stroke.  History of migraine.

EXAM:
CT HEAD WITHOUT CONTRAST
TECHNIQUE: Contiguous axial images were obtained from the base of the skull
through the vertex without intravenous contrast.

[Series 3: head wo · axial · 0.43mm/px · z∈[-164,-44]mm · 7 of 34 slices shown, 9 images]
[im 5/34  brain]
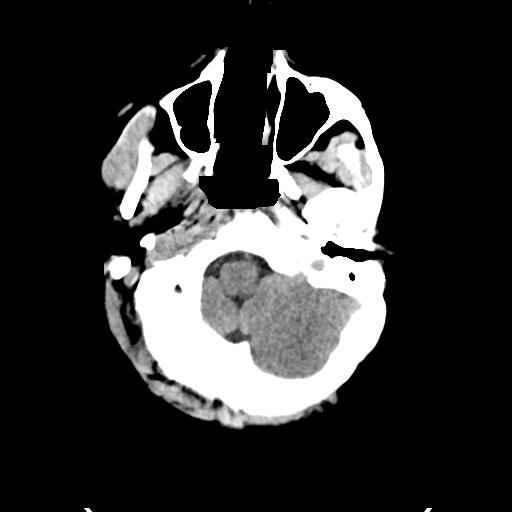
[im 5/34  bone]
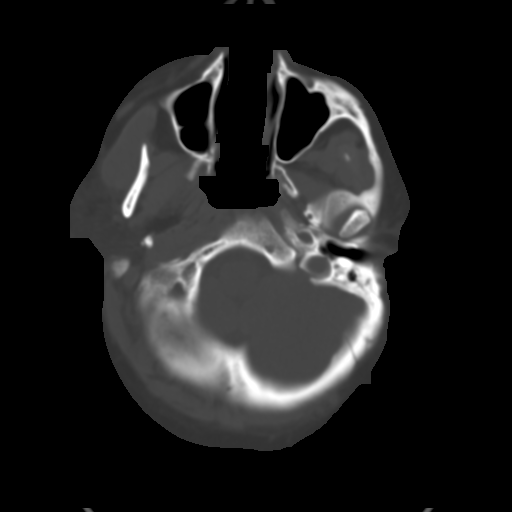
[im 9/34  brain]
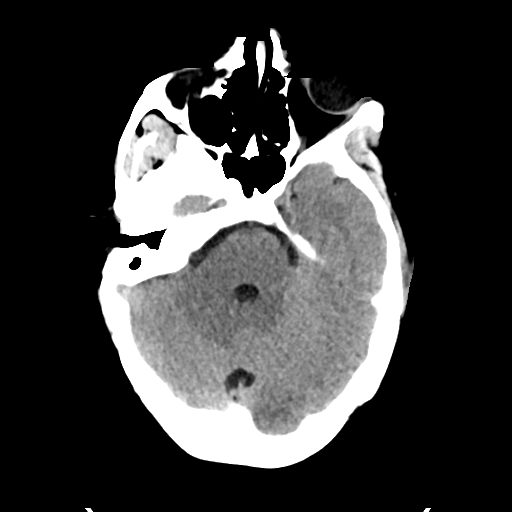
[im 13/34  brain]
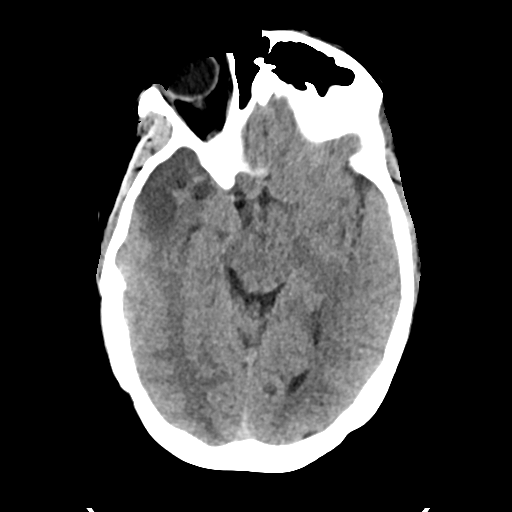
[im 17/34  brain]
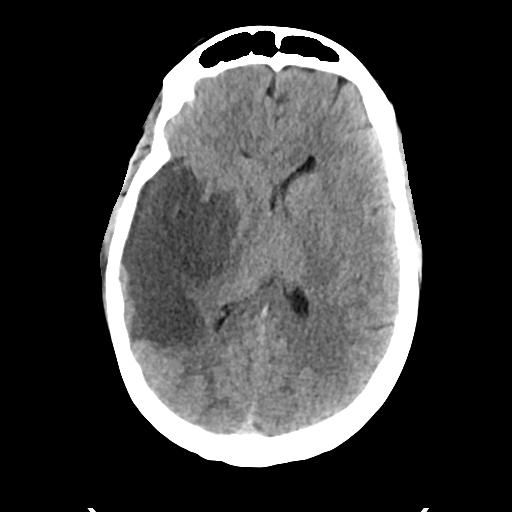
[im 21/34  brain]
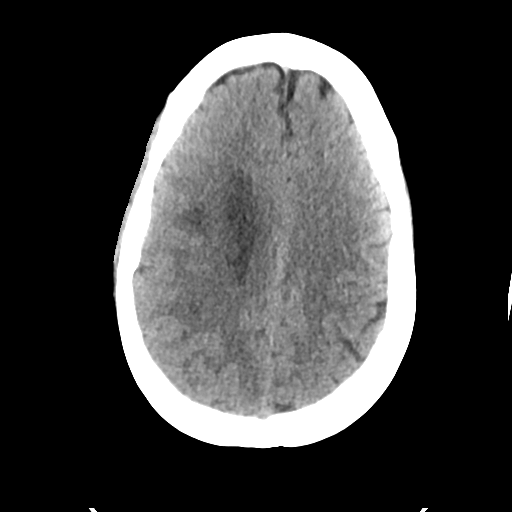
[im 21/34  bone]
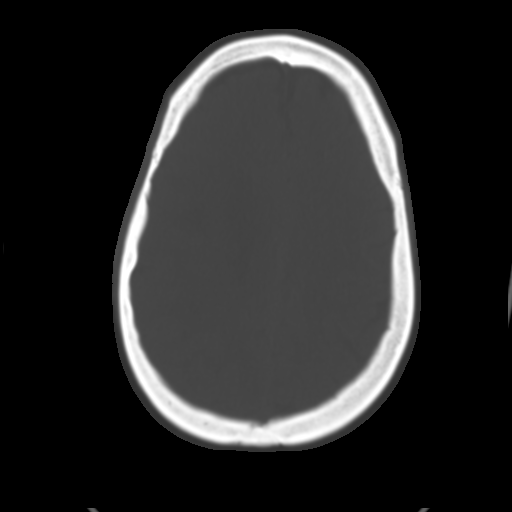
[im 25/34  brain]
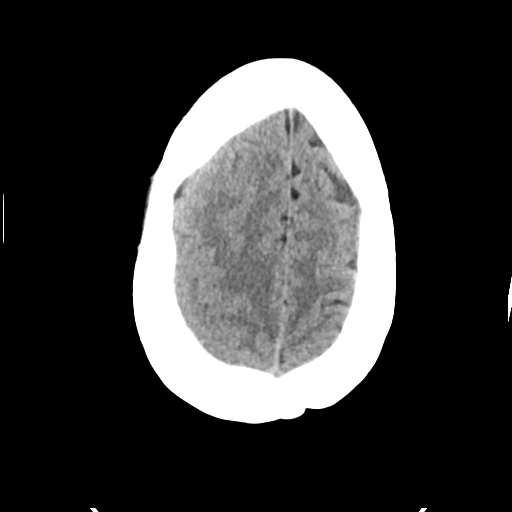
[im 29/34  brain]
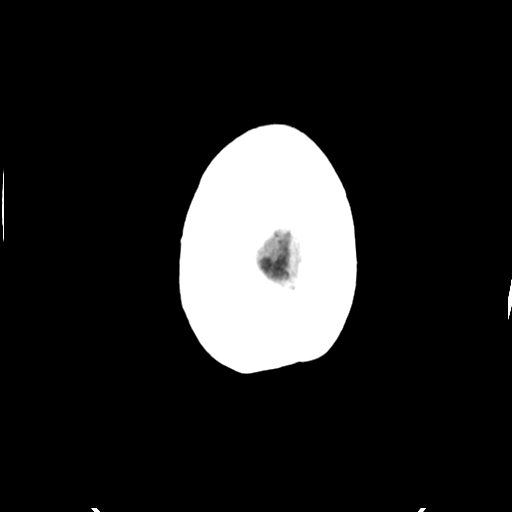

[Series 4: head bone · axial · 0.43mm/px · z∈[-168,-136]mm · 3 of 83 slices shown]
[im 9/83  bone]
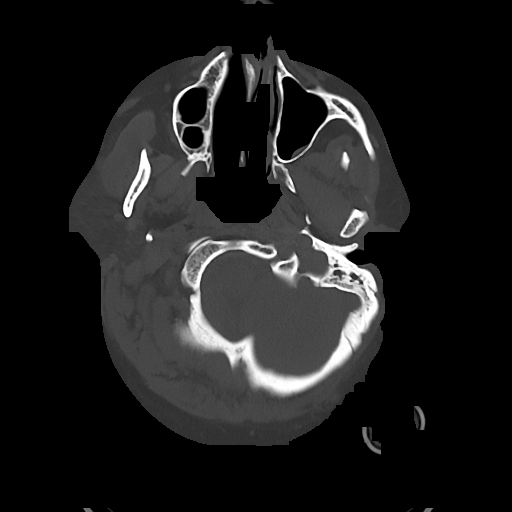
[im 17/83  bone]
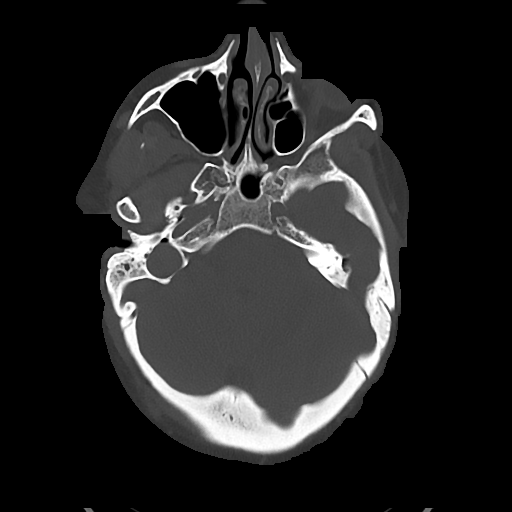
[im 25/83  bone]
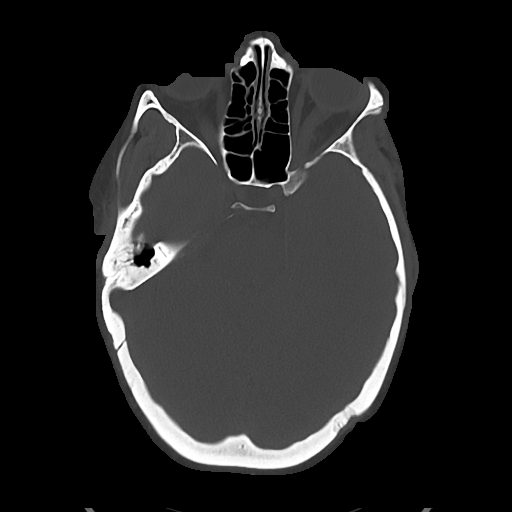

[Series 5: cor soft · coronal · 0.33mm/px · 3 of 70 slices shown]
[im 24/70  brain]
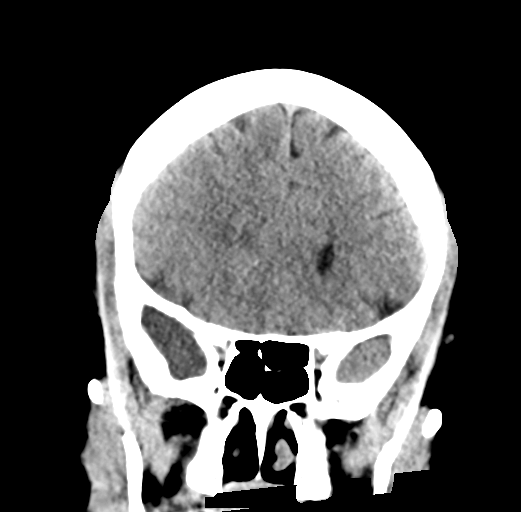
[im 31/70  brain]
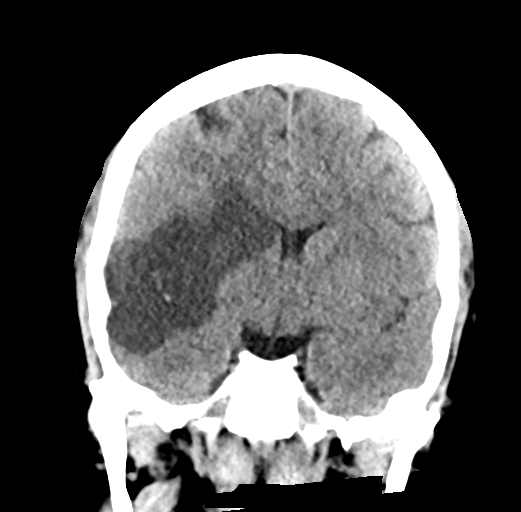
[im 39/70  brain]
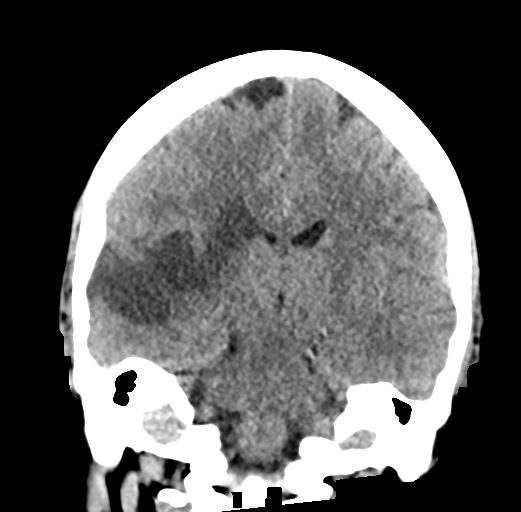

[Series 6: sag soft · sagittal · 0.32mm/px · 3 of 60 slices shown]
[im 20/60  brain]
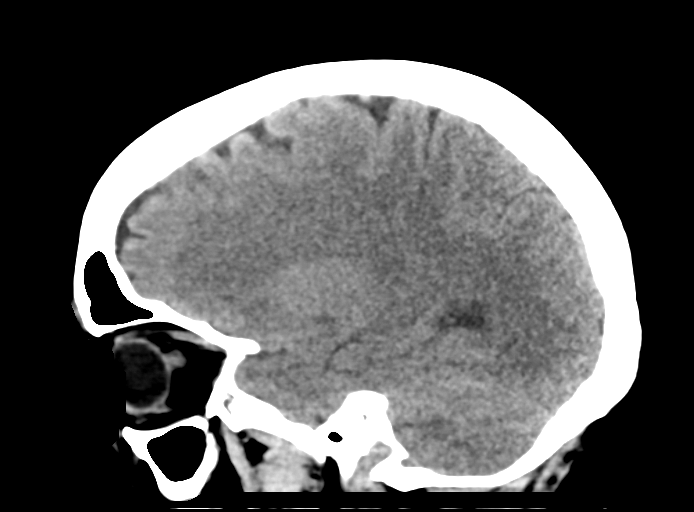
[im 30/60  brain]
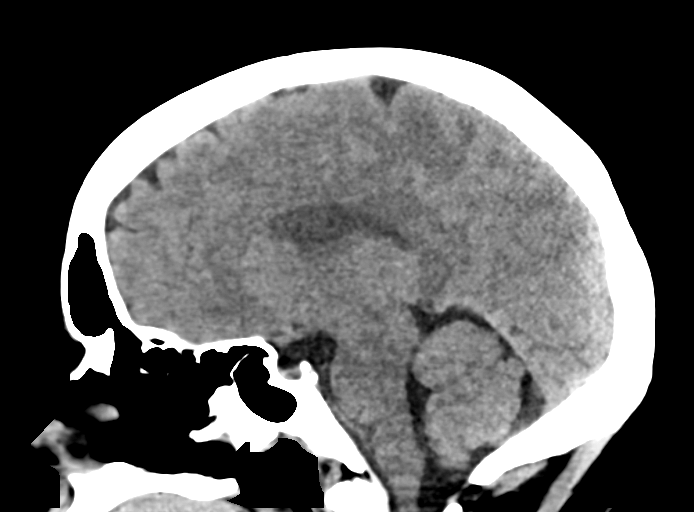
[im 40/60  brain]
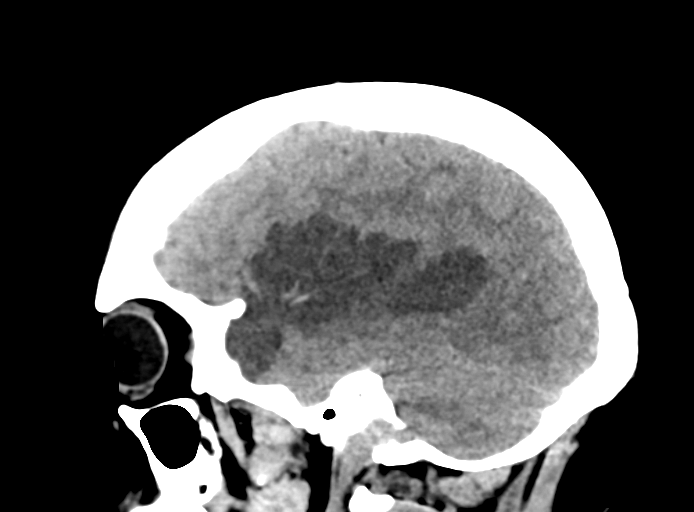

[16 of 47 positions shown; findings below may reference images not displayed]

FINDINGS: BRAIN: Evolving acute to early subacute RIGHT temporal, frontal,
parietal and basal ganglia infarct. Partially effaced RIGHT lateral
ventricle without entrapment. 5 mm RIGHT to LEFT midline shift,
increased from 3 mm. No intraparenchymal hemorrhage. Basal cisterns
are patent. No abnormal extra-axial fluid collections.

VASCULAR: Dense RIGHT middle cerebral artery.

SKULL/SOFT TISSUES: No skull fracture. No significant soft tissue
swelling.

ORBITS/SINUSES: The included ocular globes and orbital contents are
normal.Trace paranasal sinus mucosal thickening. Dehiscent RIGHT
jugular bulb. Under pneumatized bilateral mastoid air cells with
trace effusions, no air cell coalescence.

OTHER: None.
IMPRESSION: 1. Evolving acute large RIGHT MCA territory infarct without
hemorrhagic conversion.
2. Increasing 5 mm RIGHT to LEFT midline shift without ventricular
entrapment.
3. Dense RIGHT MCA consistent with thromboembolism.

## 2019-06-24 ENCOUNTER — Other Ambulatory Visit: Payer: Self-pay | Admitting: Legal Medicine

## 2019-06-24 ENCOUNTER — Telehealth: Payer: Self-pay

## 2019-06-24 DIAGNOSIS — I2699 Other pulmonary embolism without acute cor pulmonale: Secondary | ICD-10-CM

## 2019-06-24 MED ORDER — WARFARIN SODIUM 1 MG PO TABS: 1.0000 mg | ORAL_TABLET | Freq: Every day | ORAL | 6 refills | Status: DC

## 2019-06-24 MED ORDER — LEVETIRACETAM 750 MG PO TABS: ORAL_TABLET | ORAL | 2 refills | Status: DC

## 2019-06-24 MED ORDER — WARFARIN SODIUM 6 MG PO TABS: 6.0000 mg | ORAL_TABLET | ORAL | 3 refills | Status: DC

## 2019-06-24 NOTE — Telephone Encounter (Signed)
PHARMACY: [facility] [place] MEDICATION: warfarin 1 mg and warfarin 6 mg MSG: THE PT CALLED TODAY REQUESTING A REFILL ON THE MEDICATION LISTED ABOVE.

## 2019-06-25 ENCOUNTER — Other Ambulatory Visit: Payer: Self-pay

## 2019-06-25 DIAGNOSIS — I2699 Other pulmonary embolism without acute cor pulmonale: Secondary | ICD-10-CM

## 2019-06-25 MED ORDER — WARFARIN SODIUM 1 MG PO TABS: 1.0000 mg | ORAL_TABLET | Freq: Every day | ORAL | 6 refills | Status: DC

## 2019-06-25 MED ORDER — WARFARIN SODIUM 6 MG PO TABS: 6.0000 mg | ORAL_TABLET | ORAL | 3 refills | Status: DC

## 2019-07-24 ENCOUNTER — Encounter: Payer: Self-pay | Admitting: Legal Medicine

## 2019-07-24 ENCOUNTER — Ambulatory Visit (INDEPENDENT_AMBULATORY_CARE_PROVIDER_SITE_OTHER): Payer: Medicare Other | Admitting: Legal Medicine

## 2019-07-24 ENCOUNTER — Other Ambulatory Visit: Payer: Self-pay

## 2019-07-24 ENCOUNTER — Other Ambulatory Visit: Payer: Self-pay | Admitting: Legal Medicine

## 2019-07-24 DIAGNOSIS — J029 Acute pharyngitis, unspecified: Secondary | ICD-10-CM | POA: Insufficient documentation

## 2019-07-24 DIAGNOSIS — J028 Acute pharyngitis due to other specified organisms: Secondary | ICD-10-CM | POA: Diagnosis not present

## 2019-07-24 LAB — POCT RAPID STREP A (OFFICE): Rapid Strep A Screen: POSITIVE — AB

## 2019-07-24 MED ORDER — AZITHROMYCIN 250 MG PO TABS: ORAL_TABLET | ORAL | 0 refills | Status: DC

## 2019-07-24 NOTE — Patient Instructions (Signed)
Pharyngitis  Pharyngitis is a sore throat (pharynx). This is when there is redness, pain, and swelling in your throat. Most of the time, this condition gets better on its own. In some cases, you may need medicine. Follow these instructions at home:  Take over-the-counter and prescription medicines only as told by your doctor. ? If you were prescribed an antibiotic medicine, take it as told by your doctor. Do not stop taking the antibiotic even if you start to feel better. ? Do not give children aspirin. Aspirin has been linked to Reye syndrome.  Drink enough water and fluids to keep your pee (urine) clear or pale yellow.  Get a lot of rest.  Rinse your mouth (gargle) with a salt-water mixture 3-4 times a day or as needed. To make a salt-water mixture, completely dissolve -1 tsp of salt in 1 cup of warm water.  If your doctor approves, you may use throat lozenges or sprays to soothe your throat. Contact a doctor if:  You have large, tender lumps in your neck.  You have a rash.  You cough up green, yellow-brown, or bloody spit. Get help right away if:  You have a stiff neck.  You drool or cannot swallow liquids.  You cannot drink or take medicines without throwing up.  You have very bad pain that does not go away with medicine.  You have problems breathing, and it is not from a stuffy nose.  You have new pain and swelling in your knees, ankles, wrists, or elbows. Summary  Pharyngitis is a sore throat (pharynx). This is when there is redness, pain, and swelling in your throat.  If you were prescribed an antibiotic medicine, take it as told by your doctor. Do not stop taking the antibiotic even if you start to feel better.  Most of the time, pharyngitis gets better on its own. Sometimes, you may need medicine. This information is not intended to replace advice given to you by your health care provider. Make sure you discuss any questions you have with your health care  provider. Document Revised: 04/21/2017 Document Reviewed: 06/14/2016 Elsevier Patient Education  2020 [employer] Inc.  

## 2019-07-24 NOTE — Assessment & Plan Note (Signed)
The strep test was positive.  We will start zithromax Z-pack

## 2019-07-24 NOTE — Progress Notes (Signed)
Acute Office Visit  Subjective:    Patient ID: Shannon Erickson, female    DOB: December 27, 1975, 44 y.o.   MRN: [id number]  Chief Complaint  Patient presents with  . Sore Throat    HPI Patient is in today for sore throat.  Started today.  No fever or chills.  She choked on biscuit. Difficult to swallow.    Past Medical History:  Diagnosis Date  . Hypertension   . Lesion of left ulnar nerve   . Meningitis 1995  . Migraine   . Seizures (HCC)   . Stroke [facility])     Past Surgical History:  Procedure Laterality Date  . CESAREAN SECTION Bilateral 1995, 2002  . CHOLECYSTECTOMY    . LYMPH NODE BIOPSY    . NO PAST SURGERIES    . TEE WITHOUT CARDIOVERSION N/A 12/05/2016   Procedure: TRANSESOPHAGEAL ECHOCARDIOGRAM (TEE);  Surgeon: Elease Hashimoto Deloris Ping, MD;  Location: [facility] ENDOSCOPY;  Service: Cardiovascular;  Laterality: N/A;    Family History  Problem Relation Age of Onset  . Lung cancer Mother        mets to brain, bone, liver  . Hypertension Father   . Heart attack Brother        at age 47  . Renal cancer Maternal Grandfather   . Heart attack Paternal Grandfather     Social History   Socioeconomic History  . Marital status: Single    Spouse name: Not on file  . Number of children: 3  . Years of education: 80  . Highest education level: Not on file  Occupational History  . Not on file  Tobacco Use  . Smoking status: Never Smoker  . Smokeless tobacco: Never Used  Substance and Sexual Activity  . Alcohol use: No  . Drug use: No  . Sexual activity: Not on file  Other Topics Concern  . Not on file  Social History Narrative  . Not on file   Social Determinants of Health   Financial Resource Strain:   . Difficulty of Paying Living Expenses: Not on file  Food Insecurity:   . Worried About Programme researcher, broadcasting/film/video in the Last Year: Not on file  . Ran Out of Food in the Last Year: Not on file  Transportation Needs:   . Lack of Transportation (Medical): Not on file  . Lack of  Transportation (Non-Medical): Not on file  Physical Activity:   . Days of Exercise per Week: Not on file  . Minutes of Exercise per Session: Not on file  Stress:   . Feeling of Stress : Not on file  Social Connections:   . Frequency of Communication with Friends and Family: Not on file  . Frequency of Social Gatherings with Friends and Family: Not on file  . Attends Religious Services: Not on file  . Active Member of Clubs or Organizations: Not on file  . Attends Banker Meetings: Not on file  . Marital Status: Not on file  Intimate Partner Violence:   . Fear of Current or Ex-Partner: Not on file  . Emotionally Abused: Not on file  . Physically Abused: Not on file  . Sexually Abused: Not on file    Outpatient Medications Prior to Visit  Medication Sig Dispense Refill  . atorvastatin (LIPITOR) 80 MG tablet Take 1 tablet (80 mg total) by mouth daily at 6 PM. 30 tablet 0  . gabapentin (NEURONTIN) 300 MG capsule Take 300 mg by mouth 2 (two) times daily.  5  . levETIRAcetam (KEPPRA) 500 MG tablet TAKE 1 & 1/2 (ONE & ONE-HALF) TABLETS BY MOUTH TWICE DAILY 90 tablet 0  . nitroGLYCERIN (NITROSTAT) 0.4 MG SL tablet Place 1 tablet under the tongue every 5 (five) minutes as needed.  0  . warfarin (COUMADIN) 1 MG tablet Take 1 tablet (1 mg total) by mouth daily for 1 dose. Take 1 tablet (1 mg) by mouth with a 6 mg tablet for a total dose of 7 mg - daily at 6pm 30 tablet 6  . warfarin (COUMADIN) 6 MG tablet Take 1 tablet (6 mg total) by mouth 1 day or 1 dose for 1 dose. Take 1 tablet (6 mg) by mouth with a 1 mg tablet for a total dose of 7 mg - daily at 6pm 30 tablet 3  . metoprolol tartrate (LOPRESSOR) 50 MG tablet Take 1 tablet (50 mg total) by mouth once for 1 dose. Please take 50 to 100 mg 1 hour before testing 2 tablet 0  . sertraline (ZOLOFT) 25 MG tablet Take 25 mg by mouth at bedtime.  4   No facility-administered medications prior to visit.    Allergies  Allergen Reactions   . Penicillins Hives    Has patient had a PCN reaction causing immediate rash, facial/tongue/throat swelling, SOB or lightheadedness with hypotension: Yes Has patient had a PCN reaction causing severe rash involving mucus membranes or skin necrosis: Yes Has patient had a PCN reaction that required hospitalization: Unk Has patient had a PCN reaction occurring within the last 10 years: No If all of the above answers are "NO", then may proceed with Cephalosporin use.     Review of Systems  Constitutional: Negative.   HENT: Positive for sore throat and trouble swallowing.   Respiratory: Negative.   Cardiovascular: Negative.   Gastrointestinal: Negative.   Genitourinary: Negative.   Musculoskeletal: Negative.        Objective:    Physical Exam Vitals and nursing note reviewed.  Constitutional:      Appearance: She is well-developed.  HENT:     Head: Normocephalic.     Right Ear: Tympanic membrane normal.     Left Ear: Tympanic membrane normal.     Mouth/Throat:     Pharynx: Oropharyngeal exudate and posterior oropharyngeal erythema present.  Cardiovascular:     Rate and Rhythm: Normal rate and regular rhythm.     Heart sounds: Normal heart sounds.  Pulmonary:     Effort: Pulmonary effort is normal.     Breath sounds: Normal breath sounds.  Musculoskeletal:     Cervical back: Normal range of motion and neck supple.  Neurological:     Mental Status: She is alert.     BP 104/70   Pulse 93   Temp 98 F (36.7 C)   Ht 5\' 3"  (1.6 m)   Wt 207 lb (93.9 kg)   SpO2 97%   BMI 36.67 kg/m  Wt Readings from Last 3 Encounters:  07/24/19 207 lb (93.9 kg)  03/26/18 208 lb (94.3 kg)  10/18/17 201 lb 9.6 oz (91.4 kg)    Health Maintenance Due  Topic Date Due  . TETANUS/TDAP  04/23/1995  . PAP SMEAR-Modifier  04/22/1997  . INFLUENZA VACCINE  12/22/2018    There are no preventive care reminders to display for this patient.   Lab Results  Component Value Date   TSH 1.119  12/03/2016   Lab Results  Component Value Date   WBC 6.4 06/24/2017   HGB  11.9 (L) 06/24/2017   HCT 35.0 (L) 06/24/2017   MCV 78.0 06/24/2017   PLT 350 06/24/2017   Lab Results  Component Value Date   NA 141 06/24/2017   K 3.8 06/24/2017   CO2 23 06/24/2017   GLUCOSE 112 (H) 06/24/2017   BUN 9 06/24/2017   CREATININE 0.80 06/24/2017   BILITOT 0.8 06/24/2017   ALKPHOS 128 (H) 06/24/2017   AST 32 06/24/2017   ALT 42 06/24/2017   PROT 6.6 06/24/2017   ALBUMIN 2.6 (L) 06/24/2017   CALCIUM 8.2 (L) 06/24/2017   ANIONGAP 9 06/24/2017   Lab Results  Component Value Date   CHOL 193 12/04/2016   Lab Results  Component Value Date   HDL 41 12/04/2016   Lab Results  Component Value Date   LDLCALC 138 (H) 12/04/2016   Lab Results  Component Value Date   TRIG 70 12/04/2016   Lab Results  Component Value Date   CHOLHDL 4.7 12/04/2016   Lab Results  Component Value Date   HGBA1C 6.2 (H) 12/04/2016       Assessment & Plan:   Problem List Items Addressed This Visit      Respiratory   Pharyngitis    The strep test was positive.  We will start zithromax Z-pack      Relevant Orders   Rapid Strep A (Completed)       Meds ordered this encounter  Medications  . azithromycin (ZITHROMAX) 250 MG tablet    Sig: 2 tablets on day 1, then 1 tablet daily on days 2-6.2 tablets on day 1, then 1 tablet daily on days 2-6.    Dispense:  6 tablet    Refill:  0     Brent Bulla, MD

## 2019-07-26 ENCOUNTER — Ambulatory Visit: Payer: Medicare Other | Admitting: Legal Medicine

## 2019-08-09 ENCOUNTER — Encounter: Payer: Self-pay | Admitting: Legal Medicine

## 2019-08-09 ENCOUNTER — Other Ambulatory Visit: Payer: Self-pay

## 2019-08-09 ENCOUNTER — Ambulatory Visit (INDEPENDENT_AMBULATORY_CARE_PROVIDER_SITE_OTHER): Payer: Medicare Other | Admitting: Legal Medicine

## 2019-08-09 VITALS — BP 110/70 | HR 77 | Temp 97.5°F | Ht 63.0 in | Wt 204.0 lb

## 2019-08-09 DIAGNOSIS — R7303 Prediabetes: Secondary | ICD-10-CM

## 2019-08-09 DIAGNOSIS — I69319 Unspecified symptoms and signs involving cognitive functions following cerebral infarction: Secondary | ICD-10-CM

## 2019-08-09 DIAGNOSIS — E782 Mixed hyperlipidemia: Secondary | ICD-10-CM

## 2019-08-09 DIAGNOSIS — G40909 Epilepsy, unspecified, not intractable, without status epilepticus: Secondary | ICD-10-CM | POA: Diagnosis not present

## 2019-08-09 DIAGNOSIS — E876 Hypokalemia: Secondary | ICD-10-CM

## 2019-08-09 DIAGNOSIS — I69354 Hemiplegia and hemiparesis following cerebral infarction affecting left non-dominant side: Secondary | ICD-10-CM

## 2019-08-09 DIAGNOSIS — I10 Essential (primary) hypertension: Secondary | ICD-10-CM

## 2019-08-09 NOTE — Progress Notes (Signed)
Established Patient Office Visit  Subjective:  Patient ID: Shannon Erickson, female    DOB: March 18, 1976  Age: 44 y.o. MRN: [id number]  CC:  Chief Complaint  Patient presents with  . Hyperlipidemia  . Prediabetes  . Hypertension    HPI Deshana Rominger presents for Chronic visit.  Patient presents with hyperlipidemia.  Compliance with treatment has been good; patient takes medicines as directed, maintains low cholesterol diet, follows up as directed, and maintains exercise regimen.  Patient is using atorvastatin without problems.  Patient presents for follow up of hypertension.  Patient tolerating diet well with side effects.  Patient was diagnosed with hypertension 2015 so has been treated for hypertension for 5 years.Patient is working on maintaining diet and exercise regimen and follows up as directed. Complication include none.  Patient has prediabetes on diet. Last A1c 6.1.   Past Medical History:  Diagnosis Date  . Hypertension   . Lesion of left ulnar nerve   . Meningitis 1995  . Migraine   . Seizures ([place])   . Stroke [facility])     Past Surgical History:  Procedure Laterality Date  . CESAREAN SECTION Bilateral 1995, 2002  . CHOLECYSTECTOMY    . LYMPH NODE BIOPSY    . NO PAST SURGERIES    . TEE WITHOUT CARDIOVERSION N/A 12/05/2016   Procedure: TRANSESOPHAGEAL ECHOCARDIOGRAM (TEE);  Surgeon: Acie Fredrickson Wonda Cheng, MD;  Location: [facility] ENDOSCOPY;  Service: Cardiovascular;  Laterality: N/A;    Family History  Problem Relation Age of Onset  . Lung cancer Mother        mets to brain, bone, liver  . Hypertension Father   . Heart attack Brother        at age 39  . Renal cancer Maternal Grandfather   . Heart attack Paternal Grandfather     Social History   Socioeconomic History  . Marital status: Single    Spouse name: Not on file  . Number of children: 3  . Years of education: 19  . Highest education level: Not on file  Occupational History  . Not on file  Tobacco  Use  . Smoking status: Never Smoker  . Smokeless tobacco: Never Used  Substance and Sexual Activity  . Alcohol use: No  . Drug use: No  . Sexual activity: Not on file  Other Topics Concern  . Not on file  Social History Narrative  . Not on file   Social Determinants of Health   Financial Resource Strain:   . Difficulty of Paying Living Expenses:   Food Insecurity:   . Worried About Charity fundraiser in the Last Year:   . Arboriculturist in the Last Year:   Transportation Needs:   . Film/video editor (Medical):   Marland Kitchen Lack of Transportation (Non-Medical):   Physical Activity:   . Days of Exercise per Week:   . Minutes of Exercise per Session:   Stress:   . Feeling of Stress :   Social Connections:   . Frequency of Communication with Friends and Family:   . Frequency of Social Gatherings with Friends and Family:   . Attends Religious Services:   . Active Member of Clubs or Organizations:   . Attends Archivist Meetings:   Marland Kitchen Marital Status:   Intimate Partner Violence:   . Fear of Current or Ex-Partner:   . Emotionally Abused:   Marland Kitchen Physically Abused:   . Sexually Abused:     Outpatient Medications Prior to  Visit  Medication Sig Dispense Refill  . atorvastatin (LIPITOR) 80 MG tablet Take 1 tablet (80 mg total) by mouth daily at 6 PM. 30 tablet 0  . azithromycin (ZITHROMAX) 250 MG tablet 2 tablets on day 1, then 1 tablet daily on days 2-6.2 tablets on day 1, then 1 tablet daily on days 2-6. 6 tablet 0  . gabapentin (NEURONTIN) 300 MG capsule Take 300 mg by mouth 2 (two) times daily.  5  . levETIRAcetam (KEPPRA) 500 MG tablet TAKE 1 & 1/2 (ONE & ONE-HALF) TABLETS BY MOUTH TWICE DAILY 90 tablet 0  . nitroGLYCERIN (NITROSTAT) 0.4 MG SL tablet Place 1 tablet under the tongue every 5 (five) minutes as needed.  0  . warfarin (COUMADIN) 1 MG tablet Take 1 tablet (1 mg total) by mouth daily for 1 dose. Take 1 tablet (1 mg) by mouth with a 6 mg tablet for a total dose  of 7 mg - daily at 6pm 30 tablet 6  . warfarin (COUMADIN) 6 MG tablet Take 1 tablet (6 mg total) by mouth 1 day or 1 dose for 1 dose. Take 1 tablet (6 mg) by mouth with a 1 mg tablet for a total dose of 7 mg - daily at 6pm 30 tablet 3   No facility-administered medications prior to visit.    Allergies  Allergen Reactions  . Penicillins Hives    Has patient had a PCN reaction causing immediate rash, facial/tongue/throat swelling, SOB or lightheadedness with hypotension: Yes Has patient had a PCN reaction causing severe rash involving mucus membranes or skin necrosis: Yes Has patient had a PCN reaction that required hospitalization: Unk Has patient had a PCN reaction occurring within the last 10 years: No If all of the above answers are "NO", then may proceed with Cephalosporin use.     ROS Review of Systems  Constitutional: Negative.   HENT: Negative.   Eyes: Negative.   Respiratory: Negative.   Cardiovascular: Negative.   Gastrointestinal: Negative.   Genitourinary: Negative.   Musculoskeletal: Negative.   Skin: Negative.   Neurological: Negative.   Psychiatric/Behavioral: Negative.       Objective:    Physical Exam  Constitutional: She is oriented to person, place, and time. She appears well-developed and well-nourished.  HENT:  Head: Normocephalic and atraumatic.  Cardiovascular: Normal rate, regular rhythm and normal heart sounds.  Pulmonary/Chest: Effort normal and breath sounds normal.  Abdominal: Bowel sounds are normal.  Musculoskeletal:     Comments: Flaccid left arm, hyperreflexic left leg  Neurological: She is alert and oriented to person, place, and time.  Left hemiplegia  Skin: Skin is warm.  Psychiatric: She has a normal mood and affect.  Vitals reviewed.   BP 110/70   Pulse 77   Temp (!) 97.5 F (36.4 C)   Ht 5\' 3"  (1.6 m)   Wt 204 lb (92.5 kg)   SpO2 97%   BMI 36.14 kg/m  Wt Readings from Last 3 Encounters:  08/09/19 204 lb (92.5 kg)   07/24/19 207 lb (93.9 kg)  03/26/18 208 lb (94.3 kg)     Health Maintenance Due  Topic Date Due  . TETANUS/TDAP  Never done  . PAP SMEAR-Modifier  Never done  . INFLUENZA VACCINE  Never done    There are no preventive care reminders to display for this patient.  Lab Results  Component Value Date   TSH 1.119 12/03/2016   Lab Results  Component Value Date   WBC 5.7 08/09/2019  HGB 12.2 08/09/2019   HCT 38.8 08/09/2019   MCV 84 08/09/2019   PLT 318 08/09/2019   Lab Results  Component Value Date   NA 141 08/09/2019   K 4.2 08/09/2019   CO2 21 08/09/2019   GLUCOSE 112 (H) 08/09/2019   BUN 10 08/09/2019   CREATININE 0.79 08/09/2019   BILITOT 0.5 08/09/2019   ALKPHOS 165 (H) 08/09/2019   AST 46 (H) 08/09/2019   ALT 69 (H) 08/09/2019   PROT 6.6 08/09/2019   ALBUMIN 3.2 (L) 08/09/2019   CALCIUM 8.5 (L) 08/09/2019   ANIONGAP 9 06/24/2017   Lab Results  Component Value Date   CHOL 129 08/09/2019   Lab Results  Component Value Date   HDL 44 08/09/2019   Lab Results  Component Value Date   LDLCALC 68 08/09/2019   Lab Results  Component Value Date   TRIG 90 08/09/2019   Lab Results  Component Value Date   CHOLHDL 2.9 08/09/2019   Lab Results  Component Value Date   HGBA1C 6.4 (H) 08/09/2019      Assessment & Plan:   Problem List Items Addressed This Visit      Cardiovascular and Mediastinum   Cognitive deficit due to recent stroke    Patient was evaluated using information from exam, tests and other diagnostic studies to perform evidence-based treatment for this disorder.  Opimizing treatment and improvement of neurologic deficits.  Patient is using mental and physical activity to maintain as much independence as possible. Patient using cane to walk..  Patient has full ability to perform ADLs.      Hypertension    An individual care plan was established and reinforced today.  The patient's status was assessed using clinical findings on exam and labs  or diagnostic tests. The patient's success at meeting treatment goals on disease specific evidence-based guidelines and found to be well controlled. SELF MANAGEMENT: The patient and I together assessed ways to personally work towards obtaining the recommended goals. RECOMMENDATIONS: avoid decongestants found in common cold remedies, decrease consumption of alcohol, perform routine monitoring of BP with home BP cuff, exercise, reduction of dietary salt, take medicines as prescribed, try not to miss doses and quit smoking.  Regular exercise and maintaining a healthy weight is needed.  Stress reduction may help. A CLINICAL SUMMARY including written plan identify barriers to care unique to individual due to social or financial issues.  We attempt to mutually creat solutions for individual and family understanding.      Relevant Orders   CBC with Differential (Completed)   Comprehensive metabolic panel (Completed)     Nervous and Auditory   Hemiparesis affecting left side as late effect of cerebrovascular accident [facility])    Patient was evaluated using information from exam, tests and other diagnostic studies to perform evidence-based treatment for this disorder.  Opimizing treatment and improvement of neurologic deficits.  Patient is using can and stretching to prevent left hand contractures to maintain as much independence as possible. Patient using cane.  Patient has full ability to perform ADLs.      Seizure disorder (HCC) - Primary    AN INDIVIDUAL CARE PLAN was established and reinforced today.  The patient's status was assessed using clinical findings on exam, labs, and other diagnostic testing. Patient's success at meeting treatment goals based on disease specific evidence-bassed guidelines and found to be in good control.No recent seizures on medicine. RECOMMENDATIONS include maintaining present medicines and treatment.        Other  Hypokalemia   Hyperlipidemia   Relevant Orders   Lipid  Panel (Completed)   Prediabetes   Relevant Orders   Hemoglobin A1c (Completed)      No orders of the defined types were placed in this encounter.   Follow-up: Return in about 4 months (around 12/09/2019) for fasting.    Brent Bulla, MD

## 2019-08-10 LAB — COMPREHENSIVE METABOLIC PANEL
ALT: 69 IU/L — ABNORMAL HIGH (ref 0–32)
AST: 46 IU/L — ABNORMAL HIGH (ref 0–40)
Albumin/Globulin Ratio: 0.9 — ABNORMAL LOW (ref 1.2–2.2)
Albumin: 3.2 g/dL — ABNORMAL LOW (ref 3.8–4.8)
Alkaline Phosphatase: 165 IU/L — ABNORMAL HIGH (ref 39–117)
BUN/Creatinine Ratio: 13 (ref 9–23)
BUN: 10 mg/dL (ref 6–24)
Bilirubin Total: 0.5 mg/dL (ref 0.0–1.2)
CO2: 21 mmol/L (ref 20–29)
Calcium: 8.5 mg/dL — ABNORMAL LOW (ref 8.7–10.2)
Chloride: 106 mmol/L (ref 96–106)
Creatinine, Ser: 0.79 mg/dL (ref 0.57–1.00)
GFR calc Af Amer: 106 mL/min/{1.73_m2} (ref >59)
GFR calc non Af Amer: 92 mL/min/{1.73_m2} (ref >59)
Globulin, Total: 3.4 g/dL (ref 1.5–4.5)
Glucose: 112 mg/dL — ABNORMAL HIGH (ref 65–99)
Potassium: 4.2 mmol/L (ref 3.5–5.2)
Sodium: 141 mmol/L (ref 134–144)
Total Protein: 6.6 g/dL (ref 6.0–8.5)

## 2019-08-10 LAB — HEMOGLOBIN A1C
Est. average glucose Bld gHb Est-mCnc: 137 mg/dL
Hgb A1c MFr Bld: 6.4 % — ABNORMAL HIGH (ref 4.8–5.6)

## 2019-08-10 LAB — CBC WITH DIFFERENTIAL/PLATELET
Basophils Absolute: 0.1 10*3/uL (ref 0.0–0.2)
Basos: 1 %
EOS (ABSOLUTE): 0.1 10*3/uL (ref 0.0–0.4)
Eos: 2 %
Hematocrit: 38.8 % (ref 34.0–46.6)
Hemoglobin: 12.2 g/dL (ref 11.1–15.9)
Immature Grans (Abs): 0 10*3/uL (ref 0.0–0.1)
Immature Granulocytes: 0 %
Lymphocytes Absolute: 1.7 10*3/uL (ref 0.7–3.1)
Lymphs: 30 %
MCH: 26.5 pg — ABNORMAL LOW (ref 26.6–33.0)
MCHC: 31.4 g/dL — ABNORMAL LOW (ref 31.5–35.7)
MCV: 84 fL (ref 79–97)
Monocytes Absolute: 0.5 10*3/uL (ref 0.1–0.9)
Monocytes: 8 %
Neutrophils Absolute: 3.3 10*3/uL (ref 1.4–7.0)
Neutrophils: 59 %
Platelets: 318 10*3/uL (ref 150–450)
RBC: 4.61 x10E6/uL (ref 3.77–5.28)
RDW: 12.8 % (ref 11.7–15.4)
WBC: 5.7 10*3/uL (ref 3.4–10.8)

## 2019-08-10 LAB — LIPID PANEL
Chol/HDL Ratio: 2.9 ratio (ref 0.0–4.4)
Cholesterol, Total: 129 mg/dL (ref 100–199)
HDL: 44 mg/dL (ref >39)
LDL Chol Calc (NIH): 68 mg/dL (ref 0–99)
Triglycerides: 90 mg/dL (ref 0–149)
VLDL Cholesterol Cal: 17 mg/dL (ref 5–40)

## 2019-08-10 LAB — CARDIOVASCULAR RISK ASSESSMENT

## 2019-08-10 NOTE — Progress Notes (Signed)
CBC normal, Glucose 112, calcium low 8.5 due to low albumin level,  liver tests up, cholesterinemia, A1c is now 6.4- borderline DM.  Work on cutting portion size and weight loss lp

## 2019-08-11 NOTE — Assessment & Plan Note (Signed)
Patient was evaluated using information from exam, tests and other diagnostic studies to perform evidence-based treatment for this disorder.  Opimizing treatment and improvement of neurologic deficits.  Patient is using mental and physical activity to maintain as much independence as possible. Patient using cane to walk..  Patient has full ability to perform ADLs.

## 2019-08-11 NOTE — Assessment & Plan Note (Signed)
AN INDIVIDUAL CARE PLAN was established and reinforced today.  The patient's status was assessed using clinical findings on exam, labs, and other diagnostic testing. Patient's success at meeting treatment goals based on disease specific evidence-bassed guidelines and found to be in good control.No recent seizures on medicine. RECOMMENDATIONS include maintaining present medicines and treatment.

## 2019-08-11 NOTE — Assessment & Plan Note (Signed)

## 2019-08-11 NOTE — Assessment & Plan Note (Signed)
Patient was evaluated using information from exam, tests and other diagnostic studies to perform evidence-based treatment for this disorder.  Opimizing treatment and improvement of neurologic deficits.  Patient is using can and stretching to prevent left hand contractures to maintain as much independence as possible. Patient using cane.  Patient has full ability to perform ADLs.

## 2019-08-13 ENCOUNTER — Other Ambulatory Visit: Payer: Medicare Other

## 2019-08-13 ENCOUNTER — Other Ambulatory Visit: Payer: Self-pay | Admitting: Legal Medicine

## 2019-08-13 ENCOUNTER — Other Ambulatory Visit: Payer: Self-pay

## 2019-08-13 DIAGNOSIS — I63411 Cerebral infarction due to embolism of right middle cerebral artery: Secondary | ICD-10-CM

## 2019-08-14 LAB — PROTIME-INR
INR: 3.3 — ABNORMAL HIGH (ref 0.9–1.2)
Prothrombin Time: 34.1 s — ABNORMAL HIGH (ref 9.1–12.0)

## 2019-08-15 ENCOUNTER — Other Ambulatory Visit: Payer: Self-pay | Admitting: Legal Medicine

## 2019-08-15 NOTE — Progress Notes (Signed)
INR 3.3 high, need to decrease warfarin one mg and recheck 2 weeks lp

## 2019-08-27 ENCOUNTER — Other Ambulatory Visit: Payer: Self-pay | Admitting: Legal Medicine

## 2019-08-28 ENCOUNTER — Other Ambulatory Visit: Payer: Self-pay

## 2019-08-28 ENCOUNTER — Other Ambulatory Visit: Payer: Self-pay | Admitting: Legal Medicine

## 2019-08-28 DIAGNOSIS — G40909 Epilepsy, unspecified, not intractable, without status epilepticus: Secondary | ICD-10-CM

## 2019-08-28 MED ORDER — LEVETIRACETAM 750 MG PO TABS: 750.0000 mg | ORAL_TABLET | Freq: Two times a day (BID) | ORAL | 6 refills | Status: DC

## 2019-08-28 MED ORDER — GABAPENTIN 300 MG PO CAPS: 300.0000 mg | ORAL_CAPSULE | Freq: Two times a day (BID) | ORAL | 2 refills | Status: DC

## 2019-08-28 MED ORDER — LEVETIRACETAM 500 MG PO TABS: 500.0000 mg | ORAL_TABLET | Freq: Two times a day (BID) | ORAL | 2 refills | Status: DC

## 2019-08-28 MED ORDER — ATORVASTATIN CALCIUM 80 MG PO TABS: 80.0000 mg | ORAL_TABLET | Freq: Every day | ORAL | 2 refills | Status: DC

## 2019-09-02 ENCOUNTER — Other Ambulatory Visit: Payer: Medicare Other

## 2019-09-02 ENCOUNTER — Other Ambulatory Visit: Payer: Self-pay

## 2019-09-02 DIAGNOSIS — I63411 Cerebral infarction due to embolism of right middle cerebral artery: Secondary | ICD-10-CM

## 2019-09-03 LAB — PROTIME-INR
INR: 1.6 — ABNORMAL HIGH (ref 0.9–1.2)
Prothrombin Time: 16.8 s — ABNORMAL HIGH (ref 9.1–12.0)

## 2019-09-03 NOTE — Progress Notes (Signed)
INR 1.6 in good control, recheck one month lp

## 2019-09-30 ENCOUNTER — Other Ambulatory Visit: Payer: Self-pay | Admitting: Legal Medicine

## 2019-09-30 ENCOUNTER — Other Ambulatory Visit: Payer: Self-pay

## 2019-09-30 MED ORDER — ATORVASTATIN CALCIUM 80 MG PO TABS: 80.0000 mg | ORAL_TABLET | Freq: Every day | ORAL | 6 refills | Status: DC

## 2019-10-03 ENCOUNTER — Other Ambulatory Visit: Payer: Self-pay

## 2019-10-03 ENCOUNTER — Other Ambulatory Visit: Payer: Medicare Other

## 2019-10-03 DIAGNOSIS — I63411 Cerebral infarction due to embolism of right middle cerebral artery: Secondary | ICD-10-CM | POA: Diagnosis not present

## 2019-10-04 LAB — PROTIME-INR
INR: 2.6 — ABNORMAL HIGH (ref 0.9–1.2)
Prothrombin Time: 26.8 s — ABNORMAL HIGH (ref 9.1–12.0)

## 2019-10-04 NOTE — Progress Notes (Signed)
INR 2.6 OK continue dose lp

## 2019-10-25 ENCOUNTER — Other Ambulatory Visit: Payer: Self-pay | Admitting: Legal Medicine

## 2019-10-25 DIAGNOSIS — I2699 Other pulmonary embolism without acute cor pulmonale: Secondary | ICD-10-CM

## 2019-11-04 ENCOUNTER — Other Ambulatory Visit: Payer: Medicare Other

## 2019-11-04 ENCOUNTER — Other Ambulatory Visit: Payer: Self-pay

## 2019-11-04 DIAGNOSIS — I63411 Cerebral infarction due to embolism of right middle cerebral artery: Secondary | ICD-10-CM

## 2019-11-05 LAB — PROTIME-INR
INR: 2 — ABNORMAL HIGH (ref 0.9–1.2)
Prothrombin Time: 20.9 s — ABNORMAL HIGH (ref 9.1–12.0)

## 2019-11-05 NOTE — Progress Notes (Signed)
INR 2.0 therapeutic follow up one month lp

## 2019-12-04 ENCOUNTER — Other Ambulatory Visit: Payer: Medicare Other

## 2019-12-04 ENCOUNTER — Other Ambulatory Visit: Payer: Self-pay

## 2019-12-04 DIAGNOSIS — I63411 Cerebral infarction due to embolism of right middle cerebral artery: Secondary | ICD-10-CM

## 2019-12-05 LAB — PROTIME-INR
INR: 2.1 — ABNORMAL HIGH (ref 0.9–1.2)
Prothrombin Time: 22.1 s — ABNORMAL HIGH (ref 9.1–12.0)

## 2019-12-05 NOTE — Progress Notes (Signed)
INR 2.1 ok, recheck one month lp

## 2019-12-11 ENCOUNTER — Ambulatory Visit (INDEPENDENT_AMBULATORY_CARE_PROVIDER_SITE_OTHER): Payer: Medicare Other | Admitting: Legal Medicine

## 2019-12-11 ENCOUNTER — Encounter: Payer: Self-pay | Admitting: Legal Medicine

## 2019-12-11 ENCOUNTER — Other Ambulatory Visit: Payer: Self-pay

## 2019-12-11 VITALS — BP 110/72 | HR 98 | Temp 98.1°F | Resp 16 | Ht 63.0 in | Wt 208.0 lb

## 2019-12-11 DIAGNOSIS — D6861 Antiphospholipid syndrome: Secondary | ICD-10-CM

## 2019-12-11 DIAGNOSIS — G40909 Epilepsy, unspecified, not intractable, without status epilepticus: Secondary | ICD-10-CM | POA: Diagnosis not present

## 2019-12-11 DIAGNOSIS — I69319 Unspecified symptoms and signs involving cognitive functions following cerebral infarction: Secondary | ICD-10-CM

## 2019-12-11 DIAGNOSIS — R7303 Prediabetes: Secondary | ICD-10-CM | POA: Diagnosis not present

## 2019-12-11 DIAGNOSIS — I10 Essential (primary) hypertension: Secondary | ICD-10-CM | POA: Diagnosis not present

## 2019-12-11 DIAGNOSIS — G8114 Spastic hemiplegia affecting left nondominant side: Secondary | ICD-10-CM

## 2019-12-11 DIAGNOSIS — E782 Mixed hyperlipidemia: Secondary | ICD-10-CM | POA: Diagnosis not present

## 2019-12-11 NOTE — Progress Notes (Signed)
Subjective:  Patient ID: Shannon Erickson, female    DOB: 04-15-1976  Age: 44 y.o. MRN: [id number]  Chief Complaint  Patient presents with  . Hyperlipidemia  . Hypertension  . Seizure Disorder    HPI: Chronic visit  Patient presents for follow up of hypertension.  Patient tolerating diet well with side effects.  Patient was diagnosed with hypertension 2010 so has been treated for hypertension for 10 years.Patient is working on maintaining diet and exercise regimen and follows up as directed. Complication include stroke.  Patient presents with hyperlipidemia.  Compliance with treatment has been good; patient takes medicines as directed, maintains low cholesterol diet, follows up as directed, and maintains exercise regimen.  Patient is using atorvastatin without problems.   Seizures , no recent seizures on keppra.  Not seeing neurology now.  Will need keppra level  Stoke stable and on coumadin stable no bleeding Current Outpatient Medications on File Prior to Visit  Medication Sig Dispense Refill  . atorvastatin (LIPITOR) 80 MG tablet Take 1 tablet (80 mg total) by mouth daily. 30 tablet 6  . gabapentin (NEURONTIN) 300 MG capsule Take 1 capsule (300 mg total) by mouth 2 (two) times daily. 60 capsule 2  . levETIRAcetam (KEPPRA) 500 MG tablet TAKE 1 & 1/2 (ONE & ONE-HALF) TABLETS BY MOUTH TWICE DAILY 90 tablet 2  . levETIRAcetam (KEPPRA) 750 MG tablet Take 1 tablet (750 mg total) by mouth 2 (two) times daily. 60 tablet 6  . nitroGLYCERIN (NITROSTAT) 0.4 MG SL tablet Place 1 tablet under the tongue every 5 (five) minutes as needed.  0  . warfarin (COUMADIN) 1 MG tablet Take 1 tablet (1 mg total) by mouth daily for 1 dose. Take 1 tablet (1 mg) by mouth with a 6 mg tablet for a total dose of 7 mg - daily at 6pm 30 tablet 6  . warfarin (COUMADIN) 6 MG tablet TAKE 1 TABLET BY MOUTH ONCE DAILY FOR 1 DOSE. TAKE 1 TAB WITH 1 MG TAB FOR A TOTAL OF 7 MG DAILY AT 6 PM 30 tablet 6   No current  facility-administered medications on file prior to visit.   Past Medical History:  Diagnosis Date  . Hypertension   . Lesion of left ulnar nerve   . Migraine   . Seizures (HCC)   . Stroke [facility])    Past Surgical History:  Procedure Laterality Date  . CESAREAN SECTION Bilateral 1995, 2002  . CHOLECYSTECTOMY    . LYMPH NODE BIOPSY    . NO PAST SURGERIES    . TEE WITHOUT CARDIOVERSION N/A 12/05/2016   Procedure: TRANSESOPHAGEAL ECHOCARDIOGRAM (TEE);  Surgeon: Elease Hashimoto Deloris Ping, MD;  Location: [facility] ENDOSCOPY;  Service: Cardiovascular;  Laterality: N/A;    Family History  Problem Relation Age of Onset  . Lung cancer Mother        mets to brain, bone, liver  . Hypertension Father   . Heart attack Brother        at age 19  . Renal cancer Maternal Grandfather   . Heart attack Paternal Grandfather    Social History   Socioeconomic History  . Marital status: Single    Spouse name: Not on file  . Number of children: 3  . Years of education: 55  . Highest education level: Not on file  Occupational History  . Not on file  Tobacco Use  . Smoking status: Never Smoker  . Smokeless tobacco: Never Used  Vaping Use  . Vaping Use: Never  used  Substance and Sexual Activity  . Alcohol use: No  . Drug use: No  . Sexual activity: Not on file  Other Topics Concern  . Not on file  Social History Narrative  . Not on file   Social Determinants of Health   Financial Resource Strain:   . Difficulty of Paying Living Expenses:   Food Insecurity:   . Worried About Programme researcher, broadcasting/film/video in the Last Year:   . Barista in the Last Year:   Transportation Needs:   . Freight forwarder (Medical):   Marland Kitchen Lack of Transportation (Non-Medical):   Physical Activity:   . Days of Exercise per Week:   . Minutes of Exercise per Session:   Stress:   . Feeling of Stress :   Social Connections:   . Frequency of Communication with Friends and Family:   . Frequency of Social Gatherings with Friends  and Family:   . Attends Religious Services:   . Active Member of Clubs or Organizations:   . Attends Banker Meetings:   Marland Kitchen Marital Status:     Review of Systems  Constitutional: Negative.   HENT: Negative.   Eyes: Negative.   Respiratory: Negative.   Cardiovascular: Negative.   Gastrointestinal: Negative.   Genitourinary: Negative.   Musculoskeletal: Negative.   Skin: Negative.   Neurological: Positive for weakness.       Left  hemiparesis  Psychiatric/Behavioral: Negative.      Objective:  BP 110/72   Pulse 98   Temp 98.1 F (36.7 C)   Resp 16   Ht 5\' 3"  (1.6 m)   Wt 208 lb (94.3 kg)   SpO2 97%   BMI 36.85 kg/m   BP/Weight 12/11/2019 08/09/2019 07/24/2019  Systolic BP 110 110 104  Diastolic BP 72 70 70  Wt. (Lbs) 208 204 207  BMI 36.85 36.14 36.67    Physical Exam Vitals reviewed.  Constitutional:      Appearance: Normal appearance. She is obese.  HENT:     Right Ear: Tympanic membrane normal.     Left Ear: Tympanic membrane normal.     Nose: Nose normal.     Mouth/Throat:     Mouth: Mucous membranes are moist.  Eyes:     Extraocular Movements: Extraocular movements intact.     Conjunctiva/sclera: Conjunctivae normal.     Pupils: Pupils are equal, round, and reactive to light.  Cardiovascular:     Rate and Rhythm: Normal rate and regular rhythm.     Pulses: Normal pulses.     Heart sounds: Normal heart sounds.  Pulmonary:     Effort: Pulmonary effort is normal.  Abdominal:     General: Abdomen is flat. Bowel sounds are normal.     Palpations: Abdomen is soft.  Musculoskeletal:     Comments: Weakness left arm, with some claw formation  Neurological:     Mental Status: She is alert and oriented to person, place, and time. Mental status is at baseline.     Comments: Left hemiparesis form stroke, cognition has improved  Psychiatric:        Mood and Affect: Mood normal.        Behavior: Behavior normal.        Thought Content: Thought  content normal.        Judgment: Judgment normal.       Lab Results  Component Value Date   WBC 5.7 08/09/2019   HGB 12.2 08/09/2019  HCT 38.8 08/09/2019   PLT 318 08/09/2019   GLUCOSE 112 (H) 08/09/2019   CHOL 129 08/09/2019   TRIG 90 08/09/2019   HDL 44 08/09/2019   LDLCALC 68 08/09/2019   ALT 69 (H) 08/09/2019   AST 46 (H) 08/09/2019   NA 141 08/09/2019   K 4.2 08/09/2019   CL 106 08/09/2019   CREATININE 0.79 08/09/2019   BUN 10 08/09/2019   CO2 21 08/09/2019   TSH 1.119 12/03/2016   INR 2.1 (H) 12/04/2019   HGBA1C 6.4 (H) 08/09/2019      Assessment & Plan:   1. Essential hypertension An individual hypertension care plan was established and reinforced today.  The patient's status was assessed using clinical findings on exam and labs or diagnostic tests. The patient's success at meeting treatment goals on disease specific evidence-based guidelines and found to be well controlled. SELF MANAGEMENT: The patient and I together assessed ways to personally work towards obtaining the recommended goals. RECOMMENDATIONS: avoid decongestants found in common cold remedies, decrease consumption of alcohol, perform routine monitoring of BP with home BP cuff, exercise, reduction of dietary salt, take medicines as prescribed, try not to miss doses and quit smoking.  Regular exercise and maintaining a healthy weight is needed.  Stress reduction may help. A CLINICAL SUMMARY including written plan identify barriers to care unique to individual due to social or financial issues.  We attempt to mutually creat solutions for individual and family understanding.  2. Seizure disorder [facility]) Patient continue on Keppra and has not had any recent seizures.  Neurologist released her.  3. Prediabetes Patient has high glucose and has gained weight.  We will check A1c 4. Mixed hyperlipidemia AN INDIVIDUAL CARE PLAN for hyperlipidemia/ cholesterol was established and reinforced today.  The patient's  status was assessed using clinical findings on exam, lab and other diagnostic tests. The patient's disease status was assessed based on evidence-based guidelines and found to be well controlled. MEDICATIONS were reviewed. SELF MANAGEMENT GOALS have been discussed and patient's success at attaining the goal of low cholesterol was assessed. RECOMMENDATION given include regular exercise 3 days a week and low cholesterol/low fat diet. CLINICAL SUMMARY including written plan to identify barriers unique to the patient due to social or economic  reasons was discussed.  5. Cognitive deficit due to recent stroke Patient's cognition has improved over time  6. Left spastic hemiparesis (HCC) Patient was evaluated using information from exam, tests and other diagnostic studies to perform evidence-based treatment for this disorder.  Opimizing treatment and improvement of neurologic deficits.  Patient is using cane to maintain as much independence as possible. Patient using uses cane.  Patient has full ability to perform ADLs.  7. Antiphospholipid syndrome (HCC) This has been stable but the cause of stroke   LAB: : CBC, CMP, Lipid, A1c, protime, keppra level Follow-up: Return in about 3 months (around 03/12/2020) for fasting.  An After Visit Summary was printed and given to the patient.  [facility] [phone]

## 2019-12-13 LAB — COMPREHENSIVE METABOLIC PANEL
ALT: 105 IU/L — ABNORMAL HIGH (ref 0–32)
AST: 64 IU/L — ABNORMAL HIGH (ref 0–40)
Albumin/Globulin Ratio: 1 — ABNORMAL LOW (ref 1.2–2.2)
Albumin: 3.2 g/dL — ABNORMAL LOW (ref 3.8–4.8)
Alkaline Phosphatase: 163 IU/L — ABNORMAL HIGH (ref 48–121)
BUN/Creatinine Ratio: 10 (ref 9–23)
BUN: 8 mg/dL (ref 6–24)
Bilirubin Total: 0.4 mg/dL (ref 0.0–1.2)
CO2: 21 mmol/L (ref 20–29)
Calcium: 8.2 mg/dL — ABNORMAL LOW (ref 8.7–10.2)
Chloride: 108 mmol/L — ABNORMAL HIGH (ref 96–106)
Creatinine, Ser: 0.78 mg/dL (ref 0.57–1.00)
GFR calc Af Amer: 108 mL/min/{1.73_m2} (ref >59)
GFR calc non Af Amer: 93 mL/min/{1.73_m2} (ref >59)
Globulin, Total: 3.2 g/dL (ref 1.5–4.5)
Glucose: 108 mg/dL — ABNORMAL HIGH (ref 65–99)
Potassium: 4 mmol/L (ref 3.5–5.2)
Sodium: 140 mmol/L (ref 134–144)
Total Protein: 6.4 g/dL (ref 6.0–8.5)

## 2019-12-13 LAB — CBC WITH DIFFERENTIAL/PLATELET
Basophils Absolute: 0.1 10*3/uL (ref 0.0–0.2)
Basos: 1 %
EOS (ABSOLUTE): 0.1 10*3/uL (ref 0.0–0.4)
Eos: 2 %
Hematocrit: 35.2 % (ref 34.0–46.6)
Hemoglobin: 10.8 g/dL — ABNORMAL LOW (ref 11.1–15.9)
Immature Grans (Abs): 0 10*3/uL (ref 0.0–0.1)
Immature Granulocytes: 0 %
Lymphocytes Absolute: 2.1 10*3/uL (ref 0.7–3.1)
Lymphs: 32 %
MCH: 24.8 pg — ABNORMAL LOW (ref 26.6–33.0)
MCHC: 30.7 g/dL — ABNORMAL LOW (ref 31.5–35.7)
MCV: 81 fL (ref 79–97)
Monocytes Absolute: 0.6 10*3/uL (ref 0.1–0.9)
Monocytes: 9 %
Neutrophils Absolute: 3.5 10*3/uL (ref 1.4–7.0)
Neutrophils: 56 %
Platelets: 309 10*3/uL (ref 150–450)
RBC: 4.35 x10E6/uL (ref 3.77–5.28)
RDW: 15.8 % — ABNORMAL HIGH (ref 11.7–15.4)
WBC: 6.4 10*3/uL (ref 3.4–10.8)

## 2019-12-13 LAB — CARDIOVASCULAR RISK ASSESSMENT

## 2019-12-13 LAB — LIPID PANEL
Chol/HDL Ratio: 3.1 ratio (ref 0.0–4.4)
Cholesterol, Total: 115 mg/dL (ref 100–199)
HDL: 37 mg/dL — ABNORMAL LOW (ref >39)
LDL Chol Calc (NIH): 61 mg/dL (ref 0–99)
Triglycerides: 84 mg/dL (ref 0–149)
VLDL Cholesterol Cal: 17 mg/dL (ref 5–40)

## 2019-12-13 LAB — LEVETIRACETAM LEVEL: Levetiracetam Lvl: 23.8 ug/mL (ref 10.0–40.0)

## 2019-12-13 LAB — HEMOGLOBIN A1C
Est. average glucose Bld gHb Est-mCnc: 143 mg/dL
Hgb A1c MFr Bld: 6.6 % — ABNORMAL HIGH (ref 4.8–5.6)

## 2019-12-14 NOTE — Progress Notes (Signed)
Still has anemia, increase iron in diet, glucose 108, kidney tests normal, liver tests up probably from keppra, calcium low, cholesterol normal, A1c 6.6 stable, levetiracetam level 23.8 normal range. lp

## 2020-01-14 ENCOUNTER — Other Ambulatory Visit: Payer: Medicare Other

## 2020-01-14 ENCOUNTER — Other Ambulatory Visit: Payer: Self-pay

## 2020-01-14 DIAGNOSIS — I63411 Cerebral infarction due to embolism of right middle cerebral artery: Secondary | ICD-10-CM

## 2020-01-15 LAB — PROTIME-INR
INR: 4.1 — ABNORMAL HIGH (ref 0.9–1.2)
Prothrombin Time: 40.8 s — ABNORMAL HIGH (ref 9.1–12.0)

## 2020-01-15 NOTE — Progress Notes (Signed)
INR 4.1 high, hold 3 doses then restart, recheck one week lp

## 2020-01-22 ENCOUNTER — Ambulatory Visit: Payer: Medicare Other

## 2020-01-23 ENCOUNTER — Ambulatory Visit: Payer: Medicare Other

## 2020-01-23 DIAGNOSIS — I63411 Cerebral infarction due to embolism of right middle cerebral artery: Secondary | ICD-10-CM | POA: Diagnosis not present

## 2020-01-24 LAB — PROTIME-INR
INR: 1.7 — ABNORMAL HIGH (ref 0.9–1.2)
Prothrombin Time: 17.5 s — ABNORMAL HIGH (ref 9.1–12.0)

## 2020-01-24 NOTE — Progress Notes (Signed)
INR 1.7 not thin enough, alternate present dose and old dose every other day and repeat Protime in two weeks lp

## 2020-01-28 ENCOUNTER — Other Ambulatory Visit: Payer: Self-pay | Admitting: Legal Medicine

## 2020-02-05 ENCOUNTER — Other Ambulatory Visit: Payer: Medicare Other

## 2020-02-05 DIAGNOSIS — I63411 Cerebral infarction due to embolism of right middle cerebral artery: Secondary | ICD-10-CM | POA: Diagnosis not present

## 2020-02-06 ENCOUNTER — Other Ambulatory Visit: Payer: Self-pay

## 2020-02-06 DIAGNOSIS — I2699 Other pulmonary embolism without acute cor pulmonale: Secondary | ICD-10-CM

## 2020-02-06 LAB — PROTIME-INR
INR: 1.6 — ABNORMAL HIGH (ref 0.9–1.2)
Prothrombin Time: 16.4 s — ABNORMAL HIGH (ref 9.1–12.0)

## 2020-02-06 MED ORDER — WARFARIN SODIUM 1 MG PO TABS: 1.0000 mg | ORAL_TABLET | Freq: Every day | ORAL | 3 refills | Status: DC

## 2020-02-06 NOTE — Progress Notes (Signed)
We will need to increase to 7 mg a day and recheck in 2 weeks lp

## 2020-02-06 NOTE — Progress Notes (Signed)
INR 1.6- tell me what coumadin she is on now lp

## 2020-02-20 ENCOUNTER — Other Ambulatory Visit: Payer: Medicare Other

## 2020-02-20 ENCOUNTER — Other Ambulatory Visit: Payer: Self-pay

## 2020-02-20 DIAGNOSIS — I63411 Cerebral infarction due to embolism of right middle cerebral artery: Secondary | ICD-10-CM | POA: Diagnosis not present

## 2020-02-21 LAB — PROTIME-INR
INR: 4.2 — ABNORMAL HIGH (ref 0.9–1.2)
Prothrombin Time: 41.7 s — ABNORMAL HIGH (ref 9.1–12.0)

## 2020-02-22 NOTE — Progress Notes (Signed)
INR now 4.2 too high, stop any OTC medicines including vitamins, stop coumcdin and restart in 3 days one mg a day less lp

## 2020-03-13 ENCOUNTER — Ambulatory Visit: Payer: Medicare Other | Admitting: Legal Medicine

## 2020-03-17 ENCOUNTER — Encounter: Payer: Self-pay | Admitting: Legal Medicine

## 2020-03-17 ENCOUNTER — Ambulatory Visit (INDEPENDENT_AMBULATORY_CARE_PROVIDER_SITE_OTHER): Payer: Medicare Other | Admitting: Legal Medicine

## 2020-03-17 ENCOUNTER — Other Ambulatory Visit: Payer: Self-pay

## 2020-03-17 VITALS — BP 110/60 | HR 84 | Temp 98.0°F | Ht 63.0 in | Wt 203.0 lb

## 2020-03-17 DIAGNOSIS — D6861 Antiphospholipid syndrome: Secondary | ICD-10-CM | POA: Diagnosis not present

## 2020-03-17 DIAGNOSIS — Z6835 Body mass index (BMI) 35.0-35.9, adult: Secondary | ICD-10-CM

## 2020-03-17 DIAGNOSIS — I10 Essential (primary) hypertension: Secondary | ICD-10-CM

## 2020-03-17 DIAGNOSIS — G40909 Epilepsy, unspecified, not intractable, without status epilepticus: Secondary | ICD-10-CM | POA: Diagnosis not present

## 2020-03-17 DIAGNOSIS — E782 Mixed hyperlipidemia: Secondary | ICD-10-CM

## 2020-03-17 DIAGNOSIS — I69319 Unspecified symptoms and signs involving cognitive functions following cerebral infarction: Secondary | ICD-10-CM

## 2020-03-17 DIAGNOSIS — G8114 Spastic hemiplegia affecting left nondominant side: Secondary | ICD-10-CM | POA: Diagnosis not present

## 2020-03-17 DIAGNOSIS — Z6836 Body mass index (BMI) 36.0-36.9, adult: Secondary | ICD-10-CM | POA: Insufficient documentation

## 2020-03-17 DIAGNOSIS — Z6831 Body mass index (BMI) 31.0-31.9, adult: Secondary | ICD-10-CM | POA: Insufficient documentation

## 2020-03-17 DIAGNOSIS — R7303 Prediabetes: Secondary | ICD-10-CM | POA: Diagnosis not present

## 2020-03-17 DIAGNOSIS — I69354 Hemiplegia and hemiparesis following cerebral infarction affecting left non-dominant side: Secondary | ICD-10-CM

## 2020-03-17 NOTE — Progress Notes (Signed)
Subjective:  Patient ID: Shannon Erickson, female    DOB: [birth year]  Age: 44 y.o. MRN: [id number]  Chief Complaint  Patient presents with  . Hypertension  . Hyperlipidemia    HPI: chronic visit   Patient presents for follow up of hypertension.  Patient tolerating diet well with side effects.  Patient was diagnosed with hypertension 2010 so has been treated for hypertension for 10 years.Patient is working on maintaining diet and exercise regimen and follows up as directed. Complication include none.  Patient presents with hyperlipidemia.  Compliance with treatment has been good; patient takes medicines as directed, maintains low cholesterol diet, follows up as directed, and maintains exercise regimen.  Patient is using atorvstatin without problems.  Patient has a cerebral infarction 2018 and has residual deficits left hemiparesis and cognitive changes . They are stable.  He is coping with this deficit by continuing exercising. The patient is continuing treatment on his own. Current Outpatient Medications on File Prior to Visit  Medication Sig Dispense Refill  . atorvastatin (LIPITOR) 80 MG tablet Take 1 tablet (80 mg total) by mouth daily. 30 tablet 6  . gabapentin (NEURONTIN) 300 MG capsule Take 1 capsule (300 mg total) by mouth 2 (two) times daily. 60 capsule 2  . levETIRAcetam (KEPPRA) 500 MG tablet TAKE 1 & 1/2 (ONE & ONE-HALF) TABLETS BY MOUTH TWICE DAILY 90 tablet 6  . levETIRAcetam (KEPPRA) 750 MG tablet Take 1 tablet (750 mg total) by mouth 2 (two) times daily. 60 tablet 6  . nitroGLYCERIN (NITROSTAT) 0.4 MG SL tablet Place 1 tablet under the tongue every 5 (five) minutes as needed.  0  . warfarin (COUMADIN) 6 MG tablet TAKE 1 TABLET BY MOUTH ONCE DAILY FOR 1 DOSE. TAKE 1 TAB WITH 1 MG TAB FOR A TOTAL OF 7 MG DAILY AT 6 PM 30 tablet 6  . warfarin (COUMADIN) 1 MG tablet Take 1 tablet (1 mg total) by mouth daily for 1 dose. 30 tablet 3   No current facility-administered medications  on file prior to visit.   Past Medical History:  Diagnosis Date  . Hypertension   . Lesion of left ulnar nerve   . Migraine   . Seizures (HCC)   . Stroke [facility])    Past Surgical History:  Procedure Laterality Date  . CESAREAN SECTION Bilateral 1995, 2002  . CHOLECYSTECTOMY    . LYMPH NODE BIOPSY    . NO PAST SURGERIES    . TEE WITHOUT CARDIOVERSION N/A 12/05/2016   Procedure: TRANSESOPHAGEAL ECHOCARDIOGRAM (TEE);  Surgeon: Elease Hashimoto Deloris Ping, MD;  Location: [facility] ENDOSCOPY;  Service: Cardiovascular;  Laterality: N/A;    Family History  Problem Relation Age of Onset  . Lung cancer Mother        mets to brain, bone, liver  . Hypertension Father   . Heart attack Brother        at age 2  . Renal cancer Maternal Grandfather   . Heart attack Paternal Grandfather    Social History   Socioeconomic History  . Marital status: Single    Spouse name: Not on file  . Number of children: 3  . Years of education: 71  . Highest education level: Not on file  Occupational History  . Not on file  Tobacco Use  . Smoking status: Never Smoker  . Smokeless tobacco: Never Used  Vaping Use  . Vaping Use: Never used  Substance and Sexual Activity  . Alcohol use: No  . Drug use: No  .  Sexual activity: Not on file  Other Topics Concern  . Not on file  Social History Narrative  . Not on file   Social Determinants of Health   Financial Resource Strain:   . Difficulty of Paying Living Expenses: Not on file  Food Insecurity:   . Worried About Programme researcher, broadcasting/film/videounning Out of Food in the Last Year: Not on file  . Ran Out of Food in the Last Year: Not on file  Transportation Needs:   . Lack of Transportation (Medical): Not on file  . Lack of Transportation (Non-Medical): Not on file  Physical Activity:   . Days of Exercise per Week: Not on file  . Minutes of Exercise per Session: Not on file  Stress:   . Feeling of Stress : Not on file  Social Connections:   . Frequency of Communication with Friends and  Family: Not on file  . Frequency of Social Gatherings with Friends and Family: Not on file  . Attends Religious Services: Not on file  . Active Member of Clubs or Organizations: Not on file  . Attends BankerClub or Organization Meetings: Not on file  . Marital Status: Not on file    Review of Systems  Constitutional: Negative.   HENT: Negative.   Eyes: Negative.   Respiratory: Negative for cough, choking and shortness of breath.   Cardiovascular: Negative for chest pain, palpitations and leg swelling.  Gastrointestinal: Negative.   Genitourinary: Negative.   Musculoskeletal: Negative.   Skin: Negative.   Neurological: Positive for weakness (left).  Psychiatric/Behavioral: Negative.      Objective:  BP 110/60   Pulse 84   Temp 98 F (36.7 C)   Ht 5\' 3"  (1.6 m)   Wt 203 lb (92.1 kg)   BMI 35.96 kg/m   BP/Weight 03/17/2020 12/11/2019 08/09/2019  Systolic BP 110 110 110  Diastolic BP 60 72 70  Wt. (Lbs) 203 208 204  BMI 35.96 36.85 36.14    Physical Exam Vitals reviewed.  Constitutional:      Appearance: Normal appearance.  HENT:     Right Ear: Tympanic membrane normal.     Left Ear: Tympanic membrane normal.     Nose: Nose normal.     Mouth/Throat:     Mouth: Mucous membranes are moist.     Pharynx: Oropharynx is clear.  Eyes:     Extraocular Movements: Extraocular movements intact.     Pupils: Pupils are equal, round, and reactive to light.  Cardiovascular:     Rate and Rhythm: Normal rate and regular rhythm.     Pulses: Normal pulses.     Heart sounds: Normal heart sounds.  Pulmonary:     Effort: Pulmonary effort is normal.     Breath sounds: Normal breath sounds.  Abdominal:     General: Abdomen is flat. Bowel sounds are normal.     Palpations: Abdomen is soft.  Musculoskeletal:     Cervical back: Normal range of motion and neck supple.     Comments: Left arm weak  Skin:    Capillary Refill: Capillary refill takes less than 2 seconds.  Neurological:      General: No focal deficit present.     Mental Status: She is alert and oriented to person, place, and time. Mental status is at baseline.  Psychiatric:        Mood and Affect: Mood normal.        Thought Content: Thought content normal.        Judgment: Judgment  normal.       Lab Results  Component Value Date   WBC 6.4 12/11/2019   HGB 10.8 (L) 12/11/2019   HCT 35.2 12/11/2019   PLT 309 12/11/2019   GLUCOSE 108 (H) 12/11/2019   CHOL 115 12/11/2019   TRIG 84 12/11/2019   HDL 37 (L) 12/11/2019   LDLCALC 61 12/11/2019   ALT 105 (H) 12/11/2019   AST 64 (H) 12/11/2019   NA 140 12/11/2019   K 4.0 12/11/2019   CL 108 (H) 12/11/2019   CREATININE 0.78 12/11/2019   BUN 8 12/11/2019   CO2 21 12/11/2019   TSH 1.119 12/03/2016   INR 4.2 (H) 02/20/2020   HGBA1C 6.6 (H) 12/11/2019      Assessment & Plan:   1. Essential hypertension - CBC with Differential/Platelet - Comprehensive metabolic panel An individual hypertension care plan was established and reinforced today.  The patient's status was assessed using clinical findings on exam and labs or diagnostic tests. The patient's success at meeting treatment goals on disease specific evidence-based guidelines and found to be well controlled. SELF MANAGEMENT: The patient and I together assessed ways to personally work towards obtaining the recommended goals. RECOMMENDATIONS: avoid decongestants found in common cold remedies, decrease consumption of alcohol, perform routine monitoring of BP with home BP cuff, exercise, reduction of dietary salt, take medicines as prescribed, try not to miss doses and quit smoking.  Regular exercise and maintaining a healthy weight is needed.  Stress reduction may help. A CLINICAL SUMMARY including written plan identify barriers to care unique to individual due to social or financial issues.  We attempt to mutually creat solutions for individual and family understanding.  2. Mixed hyperlipidemia - Lipid  panel AN INDIVIDUAL CARE PLAN for hyperlipidemia/ cholesterol was established and reinforced today.  The patient's status was assessed using clinical findings on exam, lab and other diagnostic tests. The patient's disease status was assessed based on evidence-based guidelines and found to be well controlled. MEDICATIONS were reviewed. SELF MANAGEMENT GOALS have been discussed and patient's success at attaining the goal of low cholesterol was assessed. RECOMMENDATION given include regular exercise 3 days a week and low cholesterol/low fat diet. CLINICAL SUMMARY including written plan to identify barriers unique to the patient due to social or economic  reasons was discussed.  3. Prediabetes - Hemoglobin A1c Patient has prediabetes and last A1c OK  4. Seizure disorder [facility]) Patient has not had seizures lately, she remains on Keppra  5. Left spastic hemiparesis (HCC) Patient has continued left hemiparesis with arm worse than leg.  It is slowly improving,  6. Cognitive deficit due to recent stroke The cognitive component of stroke is slowly getting better   7. Antiphospholipid syndrome (HCC) - Protime-INR Patient has chronic antiphospholipid syndrome and is on chronic anticagualtion  8. BMI 35.0-35.9,adult An individualize plan was formulated for obesity using patient history and physical exam to encourage weight loss.  An evidence based program was formulated.  Patient is to cut meal size with meals and to plan physical exercise 3 days a week at least 20 minutes.  Weight watchers and other programs are helpful.  Planned amount of weight loss 5 lbs.      Orders Placed This Encounter  Procedures  . CBC with Differential/Platelet  . Comprehensive metabolic panel  . Hemoglobin A1c  . Lipid panel  . Protime-INR     Follow-up: Return in about 4 months (around 07/18/2020) for fasting.  An After Visit Summary was printed and given  to the patient.  [facility] [phone]

## 2020-03-18 ENCOUNTER — Other Ambulatory Visit: Payer: Self-pay

## 2020-03-18 DIAGNOSIS — R748 Abnormal levels of other serum enzymes: Secondary | ICD-10-CM

## 2020-03-18 LAB — HEMOGLOBIN A1C
Est. average glucose Bld gHb Est-mCnc: 134 mg/dL
Hgb A1c MFr Bld: 6.3 % — ABNORMAL HIGH (ref 4.8–5.6)

## 2020-03-18 LAB — COMPREHENSIVE METABOLIC PANEL
ALT: 152 IU/L — ABNORMAL HIGH (ref 0–32)
AST: 93 IU/L — ABNORMAL HIGH (ref 0–40)
Albumin/Globulin Ratio: 1 — ABNORMAL LOW (ref 1.2–2.2)
Albumin: 3.4 g/dL — ABNORMAL LOW (ref 3.8–4.8)
Alkaline Phosphatase: 174 IU/L — ABNORMAL HIGH (ref 44–121)
BUN/Creatinine Ratio: 15 (ref 9–23)
BUN: 12 mg/dL (ref 6–24)
Bilirubin Total: 0.5 mg/dL (ref 0.0–1.2)
CO2: 22 mmol/L (ref 20–29)
Calcium: 8.5 mg/dL — ABNORMAL LOW (ref 8.7–10.2)
Chloride: 104 mmol/L (ref 96–106)
Creatinine, Ser: 0.82 mg/dL (ref 0.57–1.00)
GFR calc Af Amer: 101 mL/min/{1.73_m2} (ref >59)
GFR calc non Af Amer: 88 mL/min/{1.73_m2} (ref >59)
Globulin, Total: 3.5 g/dL (ref 1.5–4.5)
Glucose: 130 mg/dL — ABNORMAL HIGH (ref 65–99)
Potassium: 4 mmol/L (ref 3.5–5.2)
Sodium: 139 mmol/L (ref 134–144)
Total Protein: 6.9 g/dL (ref 6.0–8.5)

## 2020-03-18 LAB — CBC WITH DIFFERENTIAL/PLATELET
Basophils Absolute: 0.1 10*3/uL (ref 0.0–0.2)
Basos: 1 %
EOS (ABSOLUTE): 0.1 10*3/uL (ref 0.0–0.4)
Eos: 2 %
Hematocrit: 42.4 % (ref 34.0–46.6)
Hemoglobin: 14.1 g/dL (ref 11.1–15.9)
Immature Grans (Abs): 0 10*3/uL (ref 0.0–0.1)
Immature Granulocytes: 0 %
Lymphocytes Absolute: 2.4 10*3/uL (ref 0.7–3.1)
Lymphs: 37 %
MCH: 27.4 pg (ref 26.6–33.0)
MCHC: 33.3 g/dL (ref 31.5–35.7)
MCV: 82 fL (ref 79–97)
Monocytes Absolute: 0.5 10*3/uL (ref 0.1–0.9)
Monocytes: 8 %
Neutrophils Absolute: 3.3 10*3/uL (ref 1.4–7.0)
Neutrophils: 52 %
Platelets: 278 10*3/uL (ref 150–450)
RBC: 5.15 x10E6/uL (ref 3.77–5.28)
RDW: 18 % — ABNORMAL HIGH (ref 11.7–15.4)
WBC: 6.4 10*3/uL (ref 3.4–10.8)

## 2020-03-18 LAB — PROTIME-INR
INR: 3 — ABNORMAL HIGH (ref 0.9–1.2)
Prothrombin Time: 29.7 s — ABNORMAL HIGH (ref 9.1–12.0)

## 2020-03-18 LAB — CARDIOVASCULAR RISK ASSESSMENT

## 2020-03-18 LAB — LIPID PANEL
Chol/HDL Ratio: 3.6 ratio (ref 0.0–4.4)
Cholesterol, Total: 140 mg/dL (ref 100–199)
HDL: 39 mg/dL — ABNORMAL LOW (ref >39)
LDL Chol Calc (NIH): 80 mg/dL (ref 0–99)
Triglycerides: 114 mg/dL (ref 0–149)
VLDL Cholesterol Cal: 21 mg/dL (ref 5–40)

## 2020-03-18 MED ORDER — METFORMIN HCL 500 MG PO TABS
500.0000 mg | ORAL_TABLET | Freq: Two times a day (BID) | ORAL | 2 refills | Status: DC
Start: 2020-03-18 — End: 2020-11-17

## 2020-03-18 NOTE — Progress Notes (Signed)
CBC normal, glucose 130, kidney tests normal, liver tests elevated possible ultrasound liver, A1c 6.3 borderline DM I would recommend starting some metformin for sugar levels, Cholesterol normal, INR 3.0 stable continue dose lp

## 2020-03-20 DIAGNOSIS — K760 Fatty (change of) liver, not elsewhere classified: Secondary | ICD-10-CM | POA: Diagnosis not present

## 2020-03-20 DIAGNOSIS — R748 Abnormal levels of other serum enzymes: Secondary | ICD-10-CM | POA: Diagnosis not present

## 2020-03-25 ENCOUNTER — Other Ambulatory Visit: Payer: Self-pay

## 2020-03-25 DIAGNOSIS — R748 Abnormal levels of other serum enzymes: Secondary | ICD-10-CM

## 2020-03-27 ENCOUNTER — Other Ambulatory Visit: Payer: Self-pay | Admitting: Legal Medicine

## 2020-04-20 ENCOUNTER — Other Ambulatory Visit: Payer: Medicare Other

## 2020-04-20 DIAGNOSIS — I63411 Cerebral infarction due to embolism of right middle cerebral artery: Secondary | ICD-10-CM | POA: Diagnosis not present

## 2020-04-21 LAB — PROTIME-INR
INR: 5.3 (ref 0.9–1.2)
Prothrombin Time: 52.4 s — ABNORMAL HIGH (ref 9.1–12.0)

## 2020-04-21 NOTE — Progress Notes (Signed)
INR 5.3 stop coumadin for 3 days, what is present dose? lp

## 2020-04-21 NOTE — Progress Notes (Signed)
Decrease to 6mg  a day, recheck 2 weeks lp

## 2020-04-25 ENCOUNTER — Other Ambulatory Visit: Payer: Self-pay | Admitting: Legal Medicine

## 2020-04-25 DIAGNOSIS — E782 Mixed hyperlipidemia: Secondary | ICD-10-CM

## 2020-04-28 ENCOUNTER — Other Ambulatory Visit: Payer: Self-pay

## 2020-04-28 DIAGNOSIS — I2699 Other pulmonary embolism without acute cor pulmonale: Secondary | ICD-10-CM

## 2020-04-28 MED ORDER — WARFARIN SODIUM 6 MG PO TABS: ORAL_TABLET | ORAL | 6 refills | Status: DC

## 2020-05-06 ENCOUNTER — Ambulatory Visit: Payer: Medicare Other

## 2020-05-11 ENCOUNTER — Ambulatory Visit: Payer: Medicare Other

## 2020-06-02 ENCOUNTER — Other Ambulatory Visit: Payer: Self-pay

## 2020-06-02 ENCOUNTER — Other Ambulatory Visit: Payer: Medicare Other

## 2020-06-02 DIAGNOSIS — I63411 Cerebral infarction due to embolism of right middle cerebral artery: Secondary | ICD-10-CM | POA: Diagnosis not present

## 2020-06-03 LAB — PROTIME-INR
INR: 1.9 — ABNORMAL HIGH (ref 0.9–1.2)
Prothrombin Time: 19.8 s — ABNORMAL HIGH (ref 9.1–12.0)

## 2020-06-03 NOTE — Progress Notes (Signed)
Inr 1.9 keep same dose lp

## 2020-07-17 ENCOUNTER — Ambulatory Visit: Payer: Medicare Other | Admitting: Legal Medicine

## 2020-07-20 ENCOUNTER — Encounter: Payer: Self-pay | Admitting: Legal Medicine

## 2020-07-20 ENCOUNTER — Ambulatory Visit (INDEPENDENT_AMBULATORY_CARE_PROVIDER_SITE_OTHER): Payer: Medicare Other | Admitting: Legal Medicine

## 2020-07-20 ENCOUNTER — Other Ambulatory Visit: Payer: Self-pay

## 2020-07-20 VITALS — BP 110/78 | HR 105 | Temp 97.6°F | Resp 17 | Ht 63.0 in | Wt 205.4 lb

## 2020-07-20 DIAGNOSIS — I69319 Unspecified symptoms and signs involving cognitive functions following cerebral infarction: Secondary | ICD-10-CM

## 2020-07-20 DIAGNOSIS — I69354 Hemiplegia and hemiparesis following cerebral infarction affecting left non-dominant side: Secondary | ICD-10-CM | POA: Diagnosis not present

## 2020-07-20 DIAGNOSIS — D6869 Other thrombophilia: Secondary | ICD-10-CM | POA: Diagnosis not present

## 2020-07-20 DIAGNOSIS — I10 Essential (primary) hypertension: Secondary | ICD-10-CM | POA: Diagnosis not present

## 2020-07-20 DIAGNOSIS — G43709 Chronic migraine without aura, not intractable, without status migrainosus: Secondary | ICD-10-CM

## 2020-07-20 DIAGNOSIS — G40909 Epilepsy, unspecified, not intractable, without status epilepticus: Secondary | ICD-10-CM | POA: Diagnosis not present

## 2020-07-20 DIAGNOSIS — E782 Mixed hyperlipidemia: Secondary | ICD-10-CM

## 2020-07-20 DIAGNOSIS — R7303 Prediabetes: Secondary | ICD-10-CM | POA: Diagnosis not present

## 2020-07-20 DIAGNOSIS — D6861 Antiphospholipid syndrome: Secondary | ICD-10-CM

## 2020-07-20 NOTE — Progress Notes (Signed)
Subjective:  Patient ID: Shannon Erickson, female    DOB: 11/17/1975  Age: 45 y.o. MRN: [id number]  Chief Complaint  Patient presents with  . Hypertension  . Hyperlipidemia    HPI: chronic visit  Patient presents for follow up of hypertension.  Patient tolerating none well with side effects.  Patient was diagnosed with hypertension 2010 so has been treated for hypertension for 10 years.Patient is working on maintaining diet and exercise regimen and follows up as directed. Complication include none.  Patient presents with hyperlipidemia.  Compliance with treatment has been good; patient takes medicines as directed, maintains low cholesterol diet, follows up as directed, and maintains exercise regimen.  Patient is using atorvastatin without problems.  Patient has a cerebral infarction 2018 and has residual deficits cognitive defects and left hemiparesis . They are stale.  He is coping with this deficit by cane. The patient is continuing treatment no.   Current Outpatient Medications on File Prior to Visit  Medication Sig Dispense Refill  . atorvastatin (LIPITOR) 80 MG tablet Take 1 tablet by mouth once daily 30 tablet 6  . gabapentin (NEURONTIN) 300 MG capsule Take 1 capsule by mouth twice daily 60 capsule 6  . levETIRAcetam (KEPPRA) 500 MG tablet TAKE 1 & 1/2 (ONE & ONE-HALF) TABLETS BY MOUTH TWICE DAILY 90 tablet 6  . levETIRAcetam (KEPPRA) 750 MG tablet Take 1 tablet (750 mg total) by mouth 2 (two) times daily. 60 tablet 6  . metFORMIN (GLUCOPHAGE) 500 MG tablet Take 1 tablet (500 mg total) by mouth 2 (two) times daily with a meal. 60 tablet 2  . nitroGLYCERIN (NITROSTAT) 0.4 MG SL tablet Place 1 tablet under the tongue every 5 (five) minutes as needed.  0  . warfarin (COUMADIN) 6 MG tablet TAKE 1 TABLET BY MOUTH ONCE DAILY FOR 1 DOSE. TAKE 1 TAB WITH 1 MG TAB FOR A TOTAL OF 7 MG DAILY AT 6 PM 30 tablet 6  . warfarin (COUMADIN) 1 MG tablet Take 1 tablet (1 mg total) by mouth daily for 1  dose. 30 tablet 3   No current facility-administered medications on file prior to visit.   Past Medical History:  Diagnosis Date  . Hypertension   . Lesion of left ulnar nerve   . Migraine   . Seizures (HCC)   . Stroke [facility](HCC)    Past Surgical History:  Procedure Laterality Date  . CESAREAN SECTION Bilateral 1995, 2002  . CHOLECYSTECTOMY    . LYMPH NODE BIOPSY    . NO PAST SURGERIES    . TEE WITHOUT CARDIOVERSION N/A 12/05/2016   Procedure: TRANSESOPHAGEAL ECHOCARDIOGRAM (TEE);  Surgeon: Elease HashimotoNahser, Deloris PingPhilip J, MD;  Location[facility] ENDOSCOPY;  Service: Cardiovascular;  Laterality: N/A;    Family History  Problem Relation Age of Onset  . Lung cancer Mother        mets to brain, bone, liver  . Hypertension Father   . Heart attack Brother        at age 45  . Renal cancer Maternal Grandfather   . Heart attack Paternal Grandfather    Social History   Socioeconomic History  . Marital status: Single    Spouse name: Not on file  . Number of children: 3  . Years of education: 4012  . Highest education level: Not on file  Occupational History  . Not on file  Tobacco Use  . Smoking status: Never Smoker  . Smokeless tobacco: Never Used  Vaping Use  . Vaping Use: Never  used  Substance and Sexual Activity  . Alcohol use: No  . Drug use: No  . Sexual activity: Not on file  Other Topics Concern  . Not on file  Social History Narrative  . Not on file   Social Determinants of Health   Financial Resource Strain: Not on file  Food Insecurity: Not on file  Transportation Needs: Not on file  Physical Activity: Not on file  Stress: Not on file  Social Connections: Not on file    Review of Systems  Constitutional: Negative for activity change, diaphoresis and unexpected weight change.  HENT: Negative.   Eyes: Negative for visual disturbance.  Respiratory: Negative for shortness of breath.   Cardiovascular: Negative for chest pain, palpitations and leg swelling.  Gastrointestinal:  Negative for abdominal distention and abdominal pain.  Endocrine: Negative for polyuria.  Genitourinary: Negative for difficulty urinating, dysuria and urgency.  Musculoskeletal: Negative for arthralgias and back pain.  Skin: Negative.   Neurological: Positive for seizures, weakness (right side) and headaches.  Psychiatric/Behavioral: Negative.      Objective:  BP 110/78 (BP Location: Right Arm, Patient Position: Sitting, Cuff Size: Normal)   Pulse (!) 105   Temp 97.6 F (36.4 C) (Temporal)   Resp 17   Ht 5\' 3"  (1.6 m)   Wt 205 lb 6.4 oz (93.2 kg)   SpO2 97%   BMI 36.38 kg/m   BP/Weight 07/20/2020 03/17/2020 12/11/2019  Systolic BP 110 110 110  Diastolic BP 78 60 72  Wt. (Lbs) 205.4 203 208  BMI 36.38 35.96 36.85    Physical Exam Vitals reviewed.  Constitutional:      Appearance: Normal appearance.  HENT:     Head: Normocephalic and atraumatic.     Right Ear: Tympanic membrane, ear canal and external ear normal.     Left Ear: Tympanic membrane, ear canal and external ear normal.     Mouth/Throat:     Mouth: Mucous membranes are dry.     Pharynx: Oropharynx is clear.  Eyes:     Extraocular Movements: Extraocular movements intact.     Conjunctiva/sclera: Conjunctivae normal.     Pupils: Pupils are equal, round, and reactive to light.  Cardiovascular:     Rate and Rhythm: Normal rate and regular rhythm.     Pulses: Normal pulses.     Heart sounds: Normal heart sounds. No murmur heard. No gallop.   Pulmonary:     Effort: Pulmonary effort is normal. No respiratory distress.     Breath sounds: Normal breath sounds. No rales.  Abdominal:     General: Abdomen is flat. Bowel sounds are normal. There is no distension.     Tenderness: There is no abdominal tenderness.  Musculoskeletal:        General: Normal range of motion.     Cervical back: Normal range of motion and neck supple.  Skin:    General: Skin is warm and dry.     Capillary Refill: Capillary refill takes  less than 2 seconds.  Neurological:     Mental Status: She is alert and oriented to person, place, and time.     Motor: Weakness present.     Coordination: Coordination abnormal.  Psychiatric:        Mood and Affect: Mood normal.        Thought Content: Thought content normal.        Judgment: Judgment normal.       Lab Results  Component Value Date   WBC  6.4 03/17/2020   HGB 14.1 03/17/2020   HCT 42.4 03/17/2020   PLT 278 03/17/2020   GLUCOSE 130 (H) 03/17/2020   CHOL 140 03/17/2020   TRIG 114 03/17/2020   HDL 39 (L) 03/17/2020   LDLCALC 80 03/17/2020   ALT 152 (H) 03/17/2020   AST 93 (H) 03/17/2020   NA 139 03/17/2020   K 4.0 03/17/2020   CL 104 03/17/2020   CREATININE 0.82 03/17/2020   BUN 12 03/17/2020   CO2 22 03/17/2020   TSH 1.119 12/03/2016   INR 1.9 (H) 06/02/2020   HGBA1C 6.3 (H) 03/17/2020      Assessment & Plan:   1. Essential hypertension - AMB Referral to [facility] An individual hypertension care plan was established and reinforced today.  The patient's status was assessed using clinical findings on exam and labs or diagnostic tests. The patient's success at meeting treatment goals on disease specific evidence-based guidelines and found to be good controlled. SELF MANAGEMENT: The patient and I together assessed ways to personally work towards obtaining the recommended goals. RECOMMENDATIONS: avoid decongestants found in common cold remedies, decrease consumption of alcohol, perform routine monitoring of BP with home BP cuff, exercise, reduction of dietary salt, take medicines as prescribed, try not to miss doses and quit smoking.  Regular exercise and maintaining a healthy weight is needed.  Stress reduction may help. A CLINICAL SUMMARY including written plan identify barriers to care unique to individual due to social or financial issues.  We attempt to mutually creat solutions for individual and family understanding.  2. Mixed  hyperlipidemia - AMB [facility] to [facility] AN INDIVIDUAL CARE PLAN for hyperlipidemia/ cholesterol was established and reinforced today.  The patient's status was assessed using clinical findings on exam, lab and other diagnostic tests. The patient's disease status was assessed based on evidence-based guidelines and found to be well controlled. MEDICATIONS were reviewed. SELF MANAGEMENT GOALS have been discussed and patient's success at attaining the goal of low cholesterol was assessed. RECOMMENDATION given include regular exercise 3 days a week and low cholesterol/low fat diet. CLINICAL SUMMARY including written plan to identify barriers unique to the patient due to social or economic  reasons was discussed.  3. Prediabetes - AMB Referral to [facility]ordinaton Patient has prediabetes and is on diabetic diet  4. Seizure disorder (HCC) - AMB Referral to [facility]ordin[facility] Patient has not had any seizure activity for at least 6 months we will recheck her levocetirizine  5. Antiphospholipid syndrome (HCC) - AMB Referral to [facility]ordin[facility] Patient's antiphospholipid syndrome has been stable and she is having very little pain and no vascular problems.  6. Chronic migraine without aura without status migrainosus, not intractable Patient still has intermittent chronic migraines but they are doing much better lately and taking care of by her medicines.  7. Cognitive deficit due to recent stroke - AMB Referral to [facility]ordin[facility] Patient had a stroke in 2018 with resulting cognitive deficit she does not think as well and sometimes has difficulty speaking.  This is very stable at the present time  8. Primary hypertension An individual hypertension care plan was established and reinforced today.  The patient's status was assessed using clinical findings on exam and labs or diagnostic tests. The patient's success at meeting treatment goals on  disease specific evidence-based guidelines and found to be well controlled. SELF MANAGEMENT: The patient and I together assessed ways to personally work towards obtaining the recommended goals. RECOMMENDATIONS: avoid  decongestants found in common cold remedies, decrease consumption of alcohol, perform routine monitoring of BP with home BP cuff, exercise, reduction of dietary salt, take medicines as prescribed, try not to miss doses and quit smoking.  Regular exercise and maintaining a healthy weight is needed.  Stress reduction may help. A CLINICAL SUMMARY including written plan identify barriers to care unique to individual due to social or financial issues.  We attempt to mutually creat solutions for individual and family understanding.  9. Hemiparesis affecting left side as late effect of cerebrovascular accident (HCC) - AMB Referral to [facility] Patient continues to have left hemi-paracysts some contracture of the left hand and is walking with a cane.  She has not changed in the last several years.  But she is able to get around.  She has full ADLs and IADLs       Orders Placed This Encounter  Procedures  . CBC with Differential/Platelet  . Comprehensive metabolic panel  . Hemoglobin A1c  . Lipid panel  . Levetiracetam level  . Protime-INR  . AMB Referral to [facility]ordin[facility]      I spent 30 minutes dedicated to the care of this patient on the date of this encounter to include face-to-face time with the patient, as well as: review records of stroke.  Follow-up: Return in about 4 months (around 11/17/2020), or needs pap, for fasting.  An After Visit Summary was printed and given to the patient.  Brent Bulla, MD [facility] [phone]

## 2020-07-21 LAB — PROTIME-INR
INR: 2.2 — ABNORMAL HIGH (ref 0.9–1.2)
Prothrombin Time: 22 s — ABNORMAL HIGH (ref 9.1–12.0)

## 2020-07-21 NOTE — Progress Notes (Signed)
INR 2.2 good, continue present dose lp

## 2020-07-22 ENCOUNTER — Telehealth: Payer: Self-pay | Admitting: Legal Medicine

## 2020-07-22 ENCOUNTER — Telehealth: Payer: Self-pay

## 2020-07-22 DIAGNOSIS — G43709 Chronic migraine without aura, not intractable, without status migrainosus: Secondary | ICD-10-CM

## 2020-07-22 DIAGNOSIS — I10 Essential (primary) hypertension: Secondary | ICD-10-CM

## 2020-07-22 NOTE — Chronic Care Management (AMB) (Signed)
  Chronic Care Management   Note  07/22/2020 Name: Natayah Warmack MRN: [id number] DOB: Jul 27, 1975  Keilin Gamboa is a 45 y.o. year old female who is a primary care patient of Abigail Miyamoto, MD. I reached out to Arlyce Dice by phone today in response to a referral sent by Ms. Jacques Earthly PCP, Abigail Miyamoto, MD.   Ms. Skillman was given information about Chronic Care Management services today including:  1. CCM service includes personalized support from designated clinical staff supervised by her physician, including individualized plan of care and coordination with other care providers 2. 24/7 contact phone numbers for assistance for urgent and routine care needs. 3. Service will only be billed when office clinical staff spend 20 minutes or more in a month to coordinate care. 4. Only one practitioner may furnish and bill the service in a calendar month. 5. The patient may stop CCM services at any time (effective at the end of the month) by phone call to the office staff.   Patient agreed to services and verbal consent obtained.   Follow up plan:   Aggie Hacker  [employer] Scheduler

## 2020-07-22 NOTE — Telephone Encounter (Signed)
CCM referral 

## 2020-07-23 LAB — LIPID PANEL
Chol/HDL Ratio: 3.3 ratio (ref 0.0–4.4)
Cholesterol, Total: 139 mg/dL (ref 100–199)
HDL: 42 mg/dL (ref >39)
LDL Chol Calc (NIH): 76 mg/dL (ref 0–99)
Triglycerides: 113 mg/dL (ref 0–149)
VLDL Cholesterol Cal: 21 mg/dL (ref 5–40)

## 2020-07-23 LAB — CBC WITH DIFFERENTIAL/PLATELET
Basophils Absolute: 0.1 10*3/uL (ref 0.0–0.2)
Basos: 1 %
EOS (ABSOLUTE): 0.1 10*3/uL (ref 0.0–0.4)
Eos: 1 %
Hematocrit: 41.6 % (ref 34.0–46.6)
Hemoglobin: 14.1 g/dL (ref 11.1–15.9)
Immature Grans (Abs): 0 10*3/uL (ref 0.0–0.1)
Immature Granulocytes: 0 %
Lymphocytes Absolute: 2.5 10*3/uL (ref 0.7–3.1)
Lymphs: 29 %
MCH: 29.7 pg (ref 26.6–33.0)
MCHC: 33.9 g/dL (ref 31.5–35.7)
MCV: 88 fL (ref 79–97)
Monocytes Absolute: 0.6 10*3/uL (ref 0.1–0.9)
Monocytes: 7 %
Neutrophils Absolute: 5.2 10*3/uL (ref 1.4–7.0)
Neutrophils: 62 %
Platelets: 276 10*3/uL (ref 150–450)
RBC: 4.74 x10E6/uL (ref 3.77–5.28)
RDW: 12.7 % (ref 11.7–15.4)
WBC: 8.4 10*3/uL (ref 3.4–10.8)

## 2020-07-23 LAB — LEVETIRACETAM LEVEL: Levetiracetam Lvl: 32.9 ug/mL (ref 10.0–40.0)

## 2020-07-23 LAB — COMPREHENSIVE METABOLIC PANEL
ALT: 85 IU/L — ABNORMAL HIGH (ref 0–32)
AST: 54 IU/L — ABNORMAL HIGH (ref 0–40)
Albumin/Globulin Ratio: 1 — ABNORMAL LOW (ref 1.2–2.2)
Albumin: 3.4 g/dL — ABNORMAL LOW (ref 3.8–4.8)
Alkaline Phosphatase: 160 IU/L — ABNORMAL HIGH (ref 44–121)
BUN/Creatinine Ratio: 12 (ref 9–23)
BUN: 10 mg/dL (ref 6–24)
Bilirubin Total: 0.6 mg/dL (ref 0.0–1.2)
CO2: 21 mmol/L (ref 20–29)
Calcium: 8.9 mg/dL (ref 8.7–10.2)
Chloride: 103 mmol/L (ref 96–106)
Creatinine, Ser: 0.81 mg/dL (ref 0.57–1.00)
Globulin, Total: 3.5 g/dL (ref 1.5–4.5)
Glucose: 108 mg/dL — ABNORMAL HIGH (ref 65–99)
Potassium: 4.2 mmol/L (ref 3.5–5.2)
Sodium: 139 mmol/L (ref 134–144)
Total Protein: 6.9 g/dL (ref 6.0–8.5)
eGFR: 92 mL/min/{1.73_m2} (ref >59)

## 2020-07-23 LAB — HEMOGLOBIN A1C
Est. average glucose Bld gHb Est-mCnc: 146 mg/dL
Hgb A1c MFr Bld: 6.7 % — ABNORMAL HIGH (ref 4.8–5.6)

## 2020-07-23 LAB — CARDIOVASCULAR RISK ASSESSMENT

## 2020-08-19 ENCOUNTER — Telehealth: Payer: Self-pay

## 2020-08-19 ENCOUNTER — Other Ambulatory Visit: Payer: Self-pay | Admitting: Legal Medicine

## 2020-08-19 ENCOUNTER — Other Ambulatory Visit: Payer: Self-pay

## 2020-08-19 NOTE — Telephone Encounter (Signed)
Error

## 2020-08-19 NOTE — Telephone Encounter (Signed)
I gave the results of bloodwork to the patient.

## 2020-08-24 ENCOUNTER — Other Ambulatory Visit: Payer: Self-pay

## 2020-08-24 DIAGNOSIS — E782 Mixed hyperlipidemia: Secondary | ICD-10-CM

## 2020-08-24 MED ORDER — ATORVASTATIN CALCIUM 80 MG PO TABS: 1.0000 | ORAL_TABLET | Freq: Every day | ORAL | 6 refills | Status: DC

## 2020-09-01 NOTE — Progress Notes (Deleted)
Chronic Care Management Pharmacy Note  09/01/2020 Name:  Shannon Erickson MRN:  [id number] DOB:  09-15-1975  Subjective: Shannon Erickson is an 45 y.o. year old female who is a primary patient of Henrene Pastor, Zeb Comfort, MD.  The CCM team was consulted for assistance with disease management and care coordination needs.    Engaged with patient by telephone for initial visit in response to provider referral for pharmacy case management and/or care coordination services.   Consent to Services:  The patient was given the following information about Chronic Care Management services today, agreed to services, and gave verbal consent: 1. CCM service includes personalized support from designated clinical staff supervised by the primary care provider, including individualized plan of care and coordination with other care providers 2. 24/7 contact phone numbers for assistance for urgent and routine care needs. 3. Service will only be billed when office clinical staff spend 20 minutes or more in a month to coordinate care. 4. Only one practitioner may furnish and bill the service in a calendar month. 5.The patient may stop CCM services at any time (effective at the end of the month) by phone call to the office staff. 6. The patient will be responsible for cost sharing (co-pay) of up to 20% of the service fee (after annual deductible is met). Patient agreed to services and consent obtained.  Patient Care Team: Lillard Anes, MD as PCP - General (Family Medicine) Burnice Logan, [facility] as Pharmacist (Pharmacist)  Recent office visits: ***  Recent consult visits: [facility] visits: {Hospital DC Yes/No:25215}  Objective:  Lab Results  Component Value Date   CREATININE 0.81 07/20/2020   BUN 10 07/20/2020   GFRNONAA 88 03/17/2020   GFRAA 101 03/17/2020   NA 139 07/20/2020   K 4.2 07/20/2020   CALCIUM 8.9 07/20/2020   CO2 21 07/20/2020   GLUCOSE 108 (H) 07/20/2020    Lab Results   Component Value Date/Time   HGBA1C 6.7 (H) 07/20/2020 11:39 AM   HGBA1C 6.3 (H) 03/17/2020 10:36 AM    Last diabetic Eye exam: No results found for: HMDIABEYEEXA  Last diabetic Foot exam: No results found for: HMDIABFOOTEX   Lab Results  Component Value Date   CHOL 139 07/20/2020   HDL 42 07/20/2020   LDLCALC 76 07/20/2020   TRIG 113 07/20/2020   CHOLHDL 3.3 07/20/2020    Hepatic Function Latest Ref Rng & Units 07/20/2020 03/17/2020 12/11/2019  Total Protein 6.0 - 8.5 g/dL 6.9 6.9 6.4  Albumin 3.8 - 4.8 g/dL 3.4(L) 3.4(L) 3.2(L)  AST 0 - 40 IU/L 54(H) 93(H) 64(H)  ALT 0 - 32 IU/L 85(H) 152(H) 105(H)  Alk Phosphatase 44 - 121 IU/L 160(H) 174(H) 163(H)  Total Bilirubin 0.0 - 1.2 mg/dL 0.6 0.5 0.4    Lab Results  Component Value Date/Time   TSH 1.119 12/03/2016 07:05 AM    CBC Latest Ref Rng & Units 07/20/2020 03/17/2020 12/11/2019  WBC 3.4 - 10.8 x10E3/uL 8.4 6.4 6.4  Hemoglobin 11.1 - 15.9 g/dL 14.1 14.1 10.8(L)  Hematocrit 34.0 - 46.6 % 41.6 42.4 35.2  Platelets 150 - 450 x10E3/uL 276 278 309    No results found for: VD25OH  Clinical ASCVD: {YES/NO:21197} The ASCVD Risk score Mikey Bussing DC Jr., et al., 2013) failed to calculate for the following reasons:   The patient has a prior MI or stroke diagnosis    Depression screen [facility] 2/9 12/11/2019  Decreased Interest 0  Down, Depressed, Hopeless 0  PHQ - 2  Score 0     ***Other: (CHADS2VASc if Afib, MMRC or CAT for COPD, ACT, DEXA)  Social History   Tobacco Use  Smoking Status Never Smoker  Smokeless Tobacco Never Used   BP Readings from Last 3 Encounters:  07/20/20 110/78  03/17/20 110/60  12/11/19 110/72   Pulse Readings from Last 3 Encounters:  07/20/20 (!) 105  03/17/20 84  12/11/19 98   Wt Readings from Last 3 Encounters:  07/20/20 205 lb 6.4 oz (93.2 kg)  03/17/20 203 lb (92.1 kg)  12/11/19 208 lb (94.3 kg)   BMI Readings from Last 3 Encounters:  07/20/20 36.38 kg/m  03/17/20 35.96 kg/m  12/11/19  36.85 kg/m    Assessment/Interventions: Review of patient past medical history, allergies, medications, health status, including review of consultants reports, laboratory and other test data, was performed as part of comprehensive evaluation and provision of chronic care management services.   SDOH:  (Social Determinants of Health) assessments and interventions performed: {yes/no:20286}  SDOH Screenings   Alcohol Screen: Not on file  Depression (PHQ2-9): Low Risk   . PHQ-2 Score: 0  Financial Resource Strain: Not on file  Food Insecurity: Not on file  Housing: Not on file  Physical Activity: Not on file  Social Connections: Not on file  Stress: Not on file  Tobacco Use: Low Risk   . Smoking Tobacco Use: Never Smoker  . Smokeless Tobacco Use: Never Used  Transportation Needs: Not on file    CCM Care Plan  Allergies  Allergen Reactions  . Penicillins Hives    Has patient had a PCN reaction causing immediate rash, facial/tongue/throat swelling, SOB or lightheadedness with hypotension: Yes Has patient had a PCN reaction causing severe rash involving mucus membranes or skin necrosis: Yes Has patient had a PCN reaction that required hospitalization: Unk Has patient had a PCN reaction occurring within the last 10 years: No If all of the above answers are "NO", then may proceed with Cephalosporin use.     Medications Reviewed Today    Reviewed by Lillard Anes, MD (Physician) on 07/20/20 at 1120  Med List Status: <None>  Medication Order Taking? Sig Documenting Provider Last Dose Status Informant  atorvastatin (LIPITOR) 80 MG tablet [id number] Yes Take 1 tablet by mouth once daily Lillard Anes, MD Taking Active   gabapentin (NEURONTIN) 300 MG capsule [id number] Yes Take 1 capsule by mouth twice daily Lillard Anes, MD Taking Active   levETIRAcetam (KEPPRA) 500 MG tablet [id number] Yes TAKE 1 & 1/2 (ONE & ONE-HALF) TABLETS BY MOUTH TWICE DAILY Lillard Anes, MD Taking Active   levETIRAcetam (KEPPRA) 750 MG tablet [id number] Yes Take 1 tablet (750 mg total) by mouth 2 (two) times daily. Lillard Anes, MD Taking Active   metFORMIN (GLUCOPHAGE) 500 MG tablet [id number] Yes Take 1 tablet (500 mg total) by mouth 2 (two) times daily with a meal. Lillard Anes, MD Taking Active   nitroGLYCERIN (NITROSTAT) 0.4 MG SL tablet [id number] Yes Place 1 tablet under the tongue every 5 (five) minutes as needed. [provider] Taking Active   warfarin (COUMADIN) 1 MG tablet [id number]  Take 1 tablet (1 mg total) by mouth daily for 1 dose. Lillard Anes, MD  Expired 02/07/20 2359   warfarin (COUMADIN) 6 MG tablet [id number] Yes TAKE 1 TABLET BY MOUTH ONCE DAILY FOR 1 DOSE. TAKE 1 TAB WITH 1 MG TAB FOR A TOTAL OF 7 MG DAILY AT 6 PM Lillard Anes, MD  Taking Active           Patient Active Problem List   Diagnosis Date Noted  . BMI 35.0-35.9,adult 03/17/2020  . Pharyngitis 07/24/2019  . Abnormal nuclear cardiac imaging test 03/26/2018  . Hypertension 02/12/2018  . Cerebral infarction ([place]) 02/12/2018  . Seizure disorder ([place]) 08/21/2017  . Cognitive deficit due to recent stroke 02/20/2017  . Spasticity 01/17/2017  . Absence of bladder continence   . Fall against object   . Acute rhinitis   . Slow transit constipation   . 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 Gait disturbance, post-stroke 12/10/2016  . Hemiparesis affecting left side as late effect of cerebrovascular accident ([place])   . Left-sided visual neglect   . Antiphospholipid syndrome ([place])   . Pain   . Prediabetes   . Dysphagia, post-stroke   . Carotid atherosclerosis, bilateral   . Thyroid nodule   . Hyperlipidemia   . Chronic migraine without aura without status migrainosus, not intractable   . Headache 12/03/2016  . Hypokalemia 12/03/2016  . Lesion of left ulnar nerve 04/28/2015    Immunization  History  Administered Date(s) Administered  . Moderna Sars-Covid-2 Vaccination 02/14/2020    Conditions to be addressed/monitored:  Hypertension, chronic migraine, constipation, seizure, antiphospholipid syndrome, prediabetes and hyperlipidemia.   There are no care plans that you recently modified to display for this patient.    Medication Assistance: {MEDASSISTANCEINFO:25044}  Patient's preferred pharmacy is:  [facility] [address], [place] [address] [place] [address] Phone: [phone] Fax: [phone]  Uses pill box? {Yes or If no, why not?:20788} Pt endorses ***% compliance  We discussed: {Pharmacy options:24294} Patient decided to: {[facility] Pharmacy [facility]  Care Plan and Follow Up Patient Decision:  Patient agrees to Care Plan and Follow-up.  Plan: Telephone follow up appointment with care management team member scheduled for:  ***  ***    Current Barriers:  . {pharmacybarriers:24917} . ***  Pharmacist Clinical Goal(s):  Marland Kitchen Patient will {PHARMACYGOALCHOICES:24921} through collaboration with PharmD and provider.  . ***  Interventions: . 1:1 collaboration with Lillard Anes, MD regarding development and update of comprehensive plan of care as evidenced by provider attestation and co-signature . Inter-disciplinary care team collaboration (see longitudinal plan of care) . Comprehensive medication review performed; medication list updated in electronic medical record  Hypertension (BP goal <130/80) -{US controlled/uncontrolled:25276} -Current treatment: . *** -Medications previously tried: ***  -Current home readings: *** -Current dietary habits: *** -Current exercise habits: *** -{ACTIONS;DENIES/REPORTS:21021675::"Denies"} hypotensive/hypertensive symptoms -Educated on {CCM BP Counseling:25124} -Counseled to monitor BP at home ***, document, and provide log at future  appointments -{CCMPHARMDINTERVENTION:25122}  Hyperlipidemia: (LDL goal < ***) -{US controlled/uncontrolled:25276} -Current treatment: . Atorvastatin 80 mg daily  . Nitroglycerin 0.4 mg sl prn  -Medications previously tried: ***  -Current dietary patterns: *** -Current exercise habits: *** -Educated on {CCM HLD Counseling:25126} -{CCMPHARMDINTERVENTION:25122}  Diabetes (A1c goal {A1c goals:23924}) -{US controlled/uncontrolled:25276} -Current medications: Marland Kitchen Metformin 500 mg bid  -Medications previously tried: ***  -Current home glucose readings . fasting glucose: *** . post prandial glucose: *** -{ACTIONS;DENIES/REPORTS:21021675::"Denies"} hypoglycemic/hyperglycemic symptoms -Current meal patterns:  . breakfast: ***  . lunch: ***  . dinner: *** . snacks: *** . drinks: *** -Current exercise: *** -Educated on {CCM DM COUNSELING:25123} -Counseled to check feet daily and get yearly eye exams -{CCMPHARMDINTERVENTION:25122}  Antiphospholipid syndrome*** (Goal: ***) -{US controlled/uncontrolled:25276} -Current treatment  . Warfarin 1 mg *** . Warfarin 6 mg *** -  Medications previously tried: ***  -{CCMPHARMDINTERVENTION:25122}  ***Seizure disorder (Goal: ***) -{US controlled/uncontrolled:25276} -Current treatment  . Gabapentin 300 mg bid . Keppra 500 mg *** . Keppray 750 mg bid *** -Medications previously tried: ***  -{CCMPHARMDINTERVENTION:25122}    Health Maintenance -Vaccine gaps: *** only 1 Moderna shot listed, no other vaccines listed, flu? *** tdap***? -Current therapy:  . *** -Educated on {ccm supplement counseling:25128} -{CCM Patient satisfied:25129} -{CCMPHARMDINTERVENTION:25122}   Patient Goals/Self-Care Activities . Patient will:  - {pharmacypatientgoals:24919}  Follow Up Plan: {CM FOLLOW UP AREQ:14830}

## 2020-09-03 ENCOUNTER — Telehealth: Payer: Self-pay

## 2020-09-03 NOTE — Progress Notes (Signed)
    Chronic Care Management Pharmacy Assistant   Name: Shannon Erickson  MRN: [id number] DOB: August 18, 1975  Shannon Erickson is an 45 y.o. year old female who presents for his initial CCM visit with the clinical pharmacist.   Conditions to be addressed/monitored: Prediabetes, HTN, HDL,   Recent office visits:  07/20/20, Dr. Marina Goodell PCP, Patient has a cerebral infarction 2018 and has residual deficits cognitive defects and left hemiparesis . They are stale.  He is coping with this deficit by cane, follow up 4 mos,  INR good 2.2 continue present dose.    03/25/21 Abnormal liver enzymes  03/20/20-[facility], fatty liver, abdominal US  03/17/20, Dr. Marina Goodell PCP, CBC normal, glucose 130, kidney tests normal, liver tests elevated possible ultrasound liver, A1c 6.3 borderline DM I would recommend starting some metformin for sugar levels, Cholesterol normal, INR 3.0 stable continue dose  Recent consult visits:  none  Hospital visits:  None in previous 6 months    Medications: Outpatient Encounter Medications as of 09/03/2020  Medication Sig  . atorvastatin (LIPITOR) 80 MG tablet Take 1 tablet (80 mg total) by mouth daily.  Marland Kitchen gabapentin (NEURONTIN) 300 MG capsule Take 1 capsule by mouth twice daily  . levETIRAcetam (KEPPRA) 500 MG tablet TAKE 1 & 1/2 (ONE & ONE-HALF) TABLETS BY MOUTH TWICE DAILY  . levETIRAcetam (KEPPRA) 750 MG tablet Take 1 tablet (750 mg total) by mouth 2 (two) times daily.  . metFORMIN (GLUCOPHAGE) 500 MG tablet Take 1 tablet (500 mg total) by mouth 2 (two) times daily with a meal.  . nitroGLYCERIN (NITROSTAT) 0.4 MG SL tablet Place 1 tablet under the tongue every 5 (five) minutes as needed.  . warfarin (COUMADIN) 1 MG tablet Take 1 tablet (1 mg total) by mouth daily for 1 dose.  . warfarin (COUMADIN) 6 MG tablet TAKE 1 TABLET BY MOUTH ONCE DAILY FOR 1 DOSE. TAKE 1 TAB WITH 1 MG TAB FOR A TOTAL OF 7 MG DAILY AT 6 PM   No facility-administered encounter medications on  file as of 09/03/2020.    Have you seen any other providers since your last visit? No  Any changes in your medications or health? Patient just stated her weight gain, but this has been going on for a while.  Any side effects from any medications? Patient noted no side effects.  Do you have an symptoms or problems not managed by your medications?  Patient has no noted issues.   Any concerns about your health right now? Patient said she is concerned about her weight gain  Has your provider asked that you check blood pressure, blood sugar, or follow special diet at home? Patient does not check her blood pressures or blood sugars, she is doing a low carb, low fat diet.  Do you get any type of exercise on a regular basis? Patient said she walks per day  Can you think of a goal you would like to reach for your health? Patient stated she wants to loose some weight  Do you have any problems getting your medications? Patient said she has no issues getting her medications.   Is there anything that you would like to discuss during the appointment? Patient would like to discuss weight loss  Patient knows to have her medications  Leilani Able, [facility] Clinical Pharmacist Assistant [phone]

## 2020-09-07 ENCOUNTER — Telehealth: Payer: Medicare Other

## 2020-09-09 ENCOUNTER — Other Ambulatory Visit: Payer: Self-pay

## 2020-09-09 ENCOUNTER — Ambulatory Visit (INDEPENDENT_AMBULATORY_CARE_PROVIDER_SITE_OTHER): Payer: Medicare Other

## 2020-09-09 DIAGNOSIS — I10 Essential (primary) hypertension: Secondary | ICD-10-CM

## 2020-09-09 DIAGNOSIS — D6861 Antiphospholipid syndrome: Secondary | ICD-10-CM

## 2020-09-09 DIAGNOSIS — E782 Mixed hyperlipidemia: Secondary | ICD-10-CM

## 2020-09-09 NOTE — Progress Notes (Signed)
Chronic Care Management Pharmacy Note  09/09/2020 Name:  Shannon Erickson MRN:  [id number] DOB:  10-15-1975   Plan Updates:   Patient is working on diet for recent Diabetes diet. Please consider updating diagnosis codes to DM from prediabetes. Patient has never taken or filled the metformin that was on her medication list. She would like to wait until after blood work in June to consider medication.   Subjective: Shannon Erickson is an 45 y.o. year old female who is a primary patient of Henrene Pastor, Zeb Comfort, MD.  The CCM team was consulted for assistance with disease management and care coordination needs.    Engaged with patient by telephone for initial visit in response to provider referral for pharmacy case management and/or care coordination services.   Consent to Services:  The patient was given the following information about Chronic Care Management services today, agreed to services, and gave verbal consent: 1. CCM service includes personalized support from designated clinical staff supervised by the primary care provider, including individualized plan of care and coordination with other care providers 2. 24/7 contact phone numbers for assistance for urgent and routine care needs. 3. Service will only be billed when office clinical staff spend 20 minutes or more in a month to coordinate care. 4. Only one practitioner may furnish and bill the service in a calendar month. 5.The patient may stop CCM services at any time (effective at the end of the month) by phone call to the office staff. 6. The patient will be responsible for cost sharing (co-pay) of up to 20% of the service fee (after annual deductible is met). Patient agreed to services and consent obtained.  Patient Care Team: Lillard Anes, MD as PCP - General (Family Medicine) Burnice Logan, [facility] as Pharmacist (Pharmacist)   Recent office visits:  07/20/20, Dr. Henrene Pastor PCP, Patient has a cerebral infarction2018and has  residual deficits cognitive defects and left hemiparesis. They are stale. He is coping with this deficit by cane, follow up 4 mos,  INR good 2.2 continue present dose.    03/25/21 Abnormal liver enzymes  03/20/20-[facility], fatty liver, abdominal US  03/17/20, Dr. Henrene Pastor PCP, CBC normal, glucose 130, kidney tests normal, liver tests elevated possible ultrasound liver, A1c 6.3 borderline DM I would recommend starting some metformin for sugar levels, Cholesterol normal, INR 3.0 stable continue dose  Recent consult visits:  none  Hospital visits:  None in previous 6 months    Objective:  Lab Results  Component Value Date   CREATININE 0.81 07/20/2020   BUN 10 07/20/2020   GFRNONAA 88 03/17/2020   GFRAA 101 03/17/2020   NA 139 07/20/2020   K 4.2 07/20/2020   CALCIUM 8.9 07/20/2020   CO2 21 07/20/2020   GLUCOSE 108 (H) 07/20/2020    Lab Results  Component Value Date/Time   HGBA1C 6.7 (H) 07/20/2020 11:39 AM   HGBA1C 6.3 (H) 03/17/2020 10:36 AM    Last diabetic Eye exam: No results found for: HMDIABEYEEXA  Last diabetic Foot exam: No results found for: HMDIABFOOTEX   Lab Results  Component Value Date   CHOL 139 07/20/2020   HDL 42 07/20/2020   LDLCALC 76 07/20/2020   TRIG 113 07/20/2020   CHOLHDL 3.3 07/20/2020    Hepatic Function Latest Ref Rng & Units 07/20/2020 03/17/2020 12/11/2019  Total Protein 6.0 - 8.5 g/dL 6.9 6.9 6.4  Albumin 3.8 - 4.8 g/dL 3.4(L) 3.4(L) 3.2(L)  AST 0 - 40 IU/L 54(H) 93(H) 64(H)  ALT 0 -  32 IU/L 85(H) 152(H) 105(H)  Alk Phosphatase 44 - 121 IU/L 160(H) 174(H) 163(H)  Total Bilirubin 0.0 - 1.2 mg/dL 0.6 0.5 0.4    Lab Results  Component Value Date/Time   TSH 1.119 12/03/2016 07:05 AM    CBC Latest Ref Rng & Units 07/20/2020 03/17/2020 12/11/2019  WBC 3.4 - 10.8 x10E3/uL 8.4 6.4 6.4  Hemoglobin 11.1 - 15.9 g/dL 14.1 14.1 10.8(L)  Hematocrit 34.0 - 46.6 % 41.6 42.4 35.2  Platelets 150 - 450 x10E3/uL 276 278 309    No  results found for: VD25OH  Clinical ASCVD: Yes  The ASCVD Risk score Mikey Bussing DC Jr., et al., 2013) failed to calculate for the following reasons:   The patient has a prior MI or stroke diagnosis    Depression screen [facility] 2/9 12/11/2019  Decreased Interest 0  Down, Depressed, Hopeless 0  PHQ - 2 Score 0     Social History   Tobacco Use  Smoking Status Never Smoker  Smokeless Tobacco Never Used   BP Readings from Last 3 Encounters:  07/20/20 110/78  03/17/20 110/60  12/11/19 110/72   Pulse Readings from Last 3 Encounters:  07/20/20 (!) 105  03/17/20 84  12/11/19 98   Wt Readings from Last 3 Encounters:  07/20/20 205 lb 6.4 oz (93.2 kg)  03/17/20 203 lb (92.1 kg)  12/11/19 208 lb (94.3 kg)   BMI Readings from Last 3 Encounters:  07/20/20 36.38 kg/m  03/17/20 35.96 kg/m  12/11/19 36.85 kg/m    Assessment/Interventions: Review of patient past medical history, allergies, medications, health status, including review of consultants reports, laboratory and other test data, was performed as part of comprehensive evaluation and provision of chronic care management services.   SDOH:  (Social Determinants of Health) assessments and interventions performed: Yes  SDOH Screenings   Alcohol Screen: Not on file  Depression (PHQ2-9): Low Risk   . PHQ-2 Score: 0  Financial Resource Strain: Not on file  Food Insecurity: No Food Insecurity  . Worried About Charity fundraiser in the Last Year: Never true  . Ran Out of Food in the Last Year: Never true  Housing: Not on file  Physical Activity: Not on file  Social Connections: Not on file  Stress: Not on file  Tobacco Use: Low Risk   . Smoking Tobacco Use: Never Smoker  . Smokeless Tobacco Use: Never Used  Transportation Needs: Not on file    CCM Care Plan  Allergies  Allergen Reactions  . Penicillins Hives    Has patient had a PCN reaction causing immediate rash, facial/tongue/throat swelling, SOB or lightheadedness with  hypotension: Yes Has patient had a PCN reaction causing severe rash involving mucus membranes or skin necrosis: Yes Has patient had a PCN reaction that required hospitalization: Unk Has patient had a PCN reaction occurring within the last 10 years: No If all of the above answers are "NO", then may proceed with Cephalosporin use.     Medications Reviewed Today    Reviewed by Burnice Logan, [facility] (Pharmacist) on 09/09/20 at 1142  Med List Status: <None>  Medication Order Taking? Sig Documenting Provider Last Dose Status Informant  atorvastatin (LIPITOR) 80 MG tablet [id number] Yes Take 1 tablet (80 mg total) by mouth daily. Lillard Anes, MD Taking Active   gabapentin (NEURONTIN) 300 MG capsule [id number] Yes Take 1 capsule by mouth twice daily  Patient taking differently: Take 300 mg by mouth 2 (two) times daily.   Lillard Anes, MD  Taking Active   levETIRAcetam (KEPPRA) 500 MG tablet [id number] Yes TAKE 1 & 1/2 (ONE & ONE-HALF) TABLETS BY MOUTH TWICE DAILY Lillard Anes, MD Taking Active   metFORMIN (GLUCOPHAGE) 500 MG tablet [id number] No Take 1 tablet (500 mg total) by mouth 2 (two) times daily with a meal.  Patient not taking: Reported on 09/09/2020   Lillard Anes, MD Not Taking Active   nitroGLYCERIN (NITROSTAT) 0.4 MG SL tablet [id number] Yes Place 1 tablet under the tongue every 5 (five) minutes as needed. [provider] Taking Active   warfarin (COUMADIN) 1 MG tablet [id number] No Take 1 tablet (1 mg total) by mouth daily for 1 dose.  Patient not taking: Reported on 09/09/2020   Lillard Anes, MD Not Taking Expired 02/07/20 2359   warfarin (COUMADIN) 6 MG tablet [id number] Yes TAKE 1 TABLET BY MOUTH ONCE DAILY FOR 1 DOSE. TAKE 1 TAB WITH 1 MG TAB FOR A TOTAL OF 7 MG DAILY AT 6 PM  Patient taking differently: Take 6 mg by mouth daily.   Lillard Anes, MD Taking Active           Patient Active Problem List   Diagnosis Date  Noted  . BMI 35.0-35.9,adult 03/17/2020  . Pharyngitis 07/24/2019  . Abnormal nuclear cardiac imaging test 03/26/2018  . Hypertension 02/12/2018  . Cerebral infarction ([place]) 02/12/2018  . Seizure disorder ([place]) 08/21/2017  . Cognitive deficit due to recent stroke 02/20/2017  . Spasticity 01/17/2017  . Absence of bladder continence   . Fall against object   . Acute rhinitis   . Slow transit constipation   . Stroke due to occlusion of right middle cerebral artery ([place]) 12/10/2016  . Gait disturbance, post-stroke 12/10/2016  . Hemiparesis affecting left side as late effect of cerebrovascular accident ([place])   . Left-sided visual neglect   . Antiphospholipid syndrome ([place])   . Pain   . Prediabetes   . Dysphagia, post-stroke   . Carotid atherosclerosis, bilateral   . Thyroid nodule   . Hyperlipidemia   . Chronic migraine without aura without status migrainosus, not intractable   . Headache 12/03/2016  . Hypokalemia 12/03/2016  . Lesion of left ulnar nerve 04/28/2015    Immunization History  Administered Date(s) Administered  . Moderna Sars-Covid-2 Vaccination 02/14/2020    Conditions to be addressed/monitored:  Hypertension, chronic migraine, constipation, seizure, antiphospholipid syndrome, prediabetes and hyperlipidemia.   Care Plan : CCM Pharmacy Care Plan  Updates made by Burnice Logan, RPH since 09/09/2020 12:00 AM    Problem: dm, htn, hld   Priority: High  Onset Date: 09/09/2020    Long-Range Goal: Disease State Management   Start Date: 09/09/2020  Expected End Date: 09/09/2021  This Visit's Progress: On track  Priority: High  Note:    Current Barriers:  . Unable to maintain control of diabetes  Pharmacist Clinical Goal(s):  Marland Kitchen Patient will maintain control of diabetes as evidenced by blood sugar and a1c  through collaboration with PharmD and provider.   Interventions: . 1:1 collaboration with Lillard Anes, MD regarding development and update of  comprehensive plan of care as evidenced by provider attestation and co-signature . Inter-disciplinary care team collaboration (see longitudinal plan of care) . Comprehensive medication review performed; medication list updated in electronic medical record  Hypertension (BP goal <130/80) -Controlled -Current treatment: . Diet and exercise -Medications previously tried: none reported  -Current home readings: not checking  -Current dietary habits: trying to eat diet to control  blood sugar readings and a1c -Current exercise habits:  walks and tries to do PT from after her stroke  -Denies hypotensive/hypertensive symptoms -Educated on BP goals and benefits of medications for prevention of heart attack, stroke and kidney damage; Daily salt intake goal < 2300 mg; Exercise goal of 150 minutes per week; -Counseled to monitor BP at home as needed, document, and provide log at future appointments -Counseled on diet and exercise extensively  Hyperlipidemia: (LDL goal < 70) -Not ideally controlled -Current treatment: . Atorvastatin 80 mg daily  . Nitroglycerin 0.4 mg sl prn  -Medications previously tried: none reported  -Current dietary patterns: working on low sugar and lower carbohydrate diet -Current exercise habits: walks and tries to do PT from after her stroke  -Educated on Cholesterol goals;  Benefits of statin for ASCVD risk reduction; Importance of limiting foods high in cholesterol; Exercise goal of 150 minutes per week; -Counseled on diet and exercise extensively Recommended to continue current medication  Diabetes (A1c goal <7%) -Controlled -Current medications: . Diet/lifestyle -Medications previously tried:  metformin - patient reports not taking in the past or now -Current home glucose readings . fasting glucose: not checking  . post prandial glucose: not checking  -Denies hypoglycemic/hyperglycemic symptoms -Current meal patterns:  . breakfast: apple cinnamon  oatmeal . lunch: vegetables - zucchini/squash, pinto beans . dinner: chicken, vegetables, fruit . snacks: not regularly snacking  . drinks:  unsweet tea with sugar free sweeteners, water -Current exercise: walks and tries to do PT from after her stroke  -Educated on A1c and blood sugar goals; Complications of diabetes including kidney damage, retinal damage, and cardiovascular disease; Exercise goal of 150 minutes per week; Carbohydrate counting and/or plate method -Counseled to check feet daily and get yearly eye exams -Counseled on diet and exercise extensively Educated on healthy diet  Antiphospholipid syndrome (Goal: reduce risk of clotting) -Controlled -Current treatment  . Warfarin 6 mg daily -Medications previously tried: none reported   -Counseled on diet and exercise extensively Recommended to continue current medication  Seizure disorder (Goal: prevent seizures) -Controlled -Current treatment  . Gabapentin 300 mg bid . Keppra 500 mg 1.5 tablets twice daily  -Medications previously tried: none reported  -Recommended to continue current medication    Health Maintenance -Vaccine gaps:  only 1 Moderna shot listed, no other vaccines listed,flu- has not had, tdap - hasn't had lately -Current therapy:  . Not reported -Recommended considering Covid booster - patient not interested at this time. Recommended flu and TDAP.    Patient Goals/Self-Care Activities . Patient will:  - take medications as prescribed focus on medication adherence by using pill box  target a minimum of 150 minutes of moderate intensity exercise weekly engage in dietary modifications by limiting carbohydrates  Follow Up Plan: Telephone follow up appointment with care management team member scheduled for: 02/2021      Medication Assistance: None required.  Patient affirms current coverage meets needs.  Patient's preferred pharmacy is:  [facility] [address], [place] [address] [place] [address] Phone: [phone] Fax: [phone]  Uses pill box? Yes Pt endorses 100% compliance  We discussed: Benefits of medication synchronization, packaging and delivery as well as enhanced pharmacist oversight with [employer]. Patient decided to: Continue current medication management strategy  Care Plan and Follow Up Patient Decision:  Patient agrees to Care Plan and Follow-up.  Plan: Telephone follow up appointment with care management team member scheduled for:  08/2021

## 2020-09-09 NOTE — Patient Instructions (Addendum)
Visit Information  Thank you for your time discussing your medications. I look forward to working with you to achieve your health care goals. Below is a summary of what we talked about during our visit.   Goals Addressed            This Visit's Progress   . Learn More About My Health       Timeframe:  Long-Range Goal Priority:  High Start Date:                             Expected End Date:                        Follow Up Date 02/2021   - tell my story and reason for my visit - ask questions - bring a list of my medicines to the visit    Why is this important?    The best way to learn about your health and care is by talking to the doctor and nurse.   They will answer your questions and give you information in the way that you like best.    Notes:     . Lifestyle Change-Hypertension       Timeframe:  Long-Range Goal Priority:  High Start Date:                             Expected End Date:                       Follow Up Date 02/2021    - agree to work together to make changes    Why is this important?    The changes that you are asked to make may be hard to do.   This is especially true when the changes are life-long.   Knowing why it is important to you is the first step.   Working on the change with your family or support person helps you not feel alone.   Reward yourself and family or support person when goals are met. This can be an activity you choose like bowling, hiking, biking, swimming or shooting hoops.     Notes:     . Set My Target A1C-Diabetes Type 2       Timeframe:  Long-Range Goal Priority:  High Start Date:                             Expected End Date:                       Follow Up Date 02/2021     - set target A1C    Why is this important?    Your target A1C is decided together by you and your doctor.   It is based on several things like your age and other health issues.    Notes:        Patient Care Plan: CCM Pharmacy  Care Plan    Problem Identified: dm, htn, hld   Priority: High  Onset Date: 09/09/2020    Long-Range Goal: Disease State Management   Start Date: 09/09/2020  Expected End Date: 09/09/2021  This Visit's Progress: On track  Priority: High  Note:    Current Barriers:  . Unable to maintain control of diabetes  Pharmacist Clinical Goal(s):  Marland Kitchen Patient will maintain control of diabetes as evidenced by blood sugar and a1c  through collaboration with PharmD and provider.   Interventions: . 1:1 collaboration with Lillard Anes, MD regarding development and update of comprehensive plan of care as evidenced by provider attestation and co-signature . Inter-disciplinary care team collaboration (see longitudinal plan of care) . Comprehensive medication review performed; medication list updated in electronic medical record  Hypertension (BP goal <130/80) -Controlled -Current treatment: . Diet and exercise -Medications previously tried: none reported  -Current home readings: not checking  -Current dietary habits: trying to eat diet to control blood sugar readings and a1c -Current exercise habits:  walks and tries to do PT from after her stroke  -Denies hypotensive/hypertensive symptoms -Educated on BP goals and benefits of medications for prevention of heart attack, stroke and kidney damage; Daily salt intake goal < 2300 mg; Exercise goal of 150 minutes per week; -Counseled to monitor BP at home as needed, document, and provide log at future appointments -Counseled on diet and exercise extensively  Hyperlipidemia: (LDL goal < 70) -Not ideally controlled -Current treatment: . Atorvastatin 80 mg daily  . Nitroglycerin 0.4 mg sl prn  -Medications previously tried: none reported  -Current dietary patterns: working on low sugar and lower carbohydrate diet -Current exercise habits: walks and tries to do PT from after her stroke  -Educated on Cholesterol goals;  Benefits of statin for  ASCVD risk reduction; Importance of limiting foods high in cholesterol; Exercise goal of 150 minutes per week; -Counseled on diet and exercise extensively Recommended to continue current medication  Diabetes (A1c goal <7%) -Controlled -Current medications: . Diet/lifestyle -Medications previously tried:  metformin - patient reports not taking in the past or now -Current home glucose readings . fasting glucose: not checking  . post prandial glucose: not checking  -Denies hypoglycemic/hyperglycemic symptoms -Current meal patterns:  . breakfast: apple cinnamon oatmeal . lunch: vegetables - zucchini/squash, pinto beans . dinner: chicken, vegetables, fruit . snacks: not regularly snacking  . drinks:  unsweet tea with sugar free sweeteners, water -Current exercise: walks and tries to do PT from after her stroke  -Educated on A1c and blood sugar goals; Complications of diabetes including kidney damage, retinal damage, and cardiovascular disease; Exercise goal of 150 minutes per week; Carbohydrate counting and/or plate method -Counseled to check feet daily and get yearly eye exams -Counseled on diet and exercise extensively Educated on healthy diet  Antiphospholipid syndrome (Goal: reduce risk of clotting) -Controlled -Current treatment  . Warfarin 6 mg daily -Medications previously tried: none reported   -Counseled on diet and exercise extensively Recommended to continue current medication  Seizure disorder (Goal: prevent seizures) -Controlled -Current treatment  . Gabapentin 300 mg bid . Keppra 500 mg 1.5 tablets twice daily  -Medications previously tried: none reported  -Recommended to continue current medication    Health Maintenance -Vaccine gaps:  only 1 Moderna shot listed, no other vaccines listed,flu- has not had, tdap - hasn't had lately -Current therapy:  . Not reported -Recommended considering Covid booster - patient not interested at this time. Recommended flu  and TDAP.    Patient Goals/Self-Care Activities . Patient will:  - take medications as prescribed focus on medication adherence by using pill box  target a minimum of 150 minutes of moderate intensity exercise weekly engage in dietary modifications by limiting carbohydrates  Follow Up Plan: Telephone follow up appointment with care management team member scheduled for: 08/2021  Ms. Cecere was given information about Chronic Care Management services today including:  1. CCM service includes personalized support from designated clinical staff supervised by her physician, including individualized plan of care and coordination with other care providers 2. 24/7 contact phone numbers for assistance for urgent and routine care needs. 3. Standard insurance, coinsurance, copays and deductibles apply for chronic care management only during months in which we provide at least 20 minutes of these services. Most insurances cover these services at 100%, however patients may be responsible for any copay, coinsurance and/or deductible if applicable. This service may help you avoid the need for more expensive face-to-face services. 4. Only one practitioner may furnish and bill the service in a calendar month. 5. The patient may stop CCM services at any time (effective at the end of the month) by phone call to the office staff.  Patient agreed to services and verbal consent obtained.   The patient verbalized understanding of instructions, educational materials, and care plan provided today and declined offer to receive copy of patient instructions, educational materials, and care plan.  Telephone follow up appointment with pharmacy team member scheduled for: 08/2021  Sherre Poot, PharmD Clinical Pharmacist [facility] [phone] (office) [phone] (mobile)  Diabetes Mellitus and Nutrition, Adult When you have diabetes, or diabetes mellitus, it is very important to have healthy  eating habits because your blood sugar (glucose) levels are greatly affected by what you eat and drink. Eating healthy foods in the right amounts, at about the same times every day, can help you:  Control your blood glucose.  Lower your risk of heart disease.  Improve your blood pressure.  Reach or maintain a healthy weight. What can affect my meal plan? Every person with diabetes is different, and each person has different needs for a meal plan. Your health care provider may recommend that you work with a dietitian to make a meal plan that is best for you. Your meal plan may vary depending on factors such as:  The calories you need.  The medicines you take.  Your weight.  Your blood glucose, blood pressure, and cholesterol levels.  Your activity level.  Other health conditions you have, such as heart or kidney disease. How do carbohydrates affect me? Carbohydrates, also called carbs, affect your blood glucose level more than any other type of food. Eating carbs naturally raises the amount of glucose in your blood. Carb counting is a method for keeping track of how many carbs you eat. Counting carbs is important to keep your blood glucose at a healthy level, especially if you use insulin or take certain oral diabetes medicines. It is important to know how many carbs you can safely have in each meal. This is different for every person. Your dietitian can help you calculate how many carbs you should have at each meal and for each snack. How does alcohol affect me? Alcohol can cause a sudden decrease in blood glucose (hypoglycemia), especially if you use insulin or take certain oral diabetes medicines. Hypoglycemia can be a life-threatening condition. Symptoms of hypoglycemia, such as sleepiness, dizziness, and confusion, are similar to symptoms of having too much alcohol.  Do not drink alcohol if: ? Your health care provider tells you not to drink. ? You are pregnant, may be pregnant, or  are planning to become pregnant.  If you drink alcohol: ? Do not drink on an empty stomach. ? Limit how much you use to:  0-1 drink a day for women.  0-2 drinks a day for men. ? Be aware of how much alcohol is in your drink. In the [place] one drink equals one 12 oz bottle of beer (355 mL), one 5 oz glass of wine (148 mL), or one 1 oz glass of hard liquor (44 mL). ? Keep yourself hydrated with water, diet soda, or unsweetened iced tea.  Keep in mind that regular soda, juice, and other mixers may contain a lot of sugar and must be counted as carbs. What are tips for following this plan? Reading food labels  Start by checking the serving size on the "Nutrition Facts" label of packaged foods and drinks. The amount of calories, carbs, fats, and other nutrients listed on the label is based on one serving of the item. Many items contain more than one serving per package.  Check the total grams (g) of carbs in one serving. You can calculate the number of servings of carbs in one serving by dividing the total carbs by 15. For example, if a food has 30 g of total carbs per serving, it would be equal to 2 servings of carbs.  Check the number of grams (g) of saturated fats and trans fats in one serving. Choose foods that have a low amount or none of these fats.  Check the number of milligrams (mg) of salt (sodium) in one serving. Most people should limit total sodium intake to less than 2,300 mg per day.  Always check the nutrition information of foods labeled as "low-fat" or "nonfat." These foods may be higher in added sugar or refined carbs and should be avoided.  Talk to your dietitian to identify your daily goals for nutrients listed on the label. Shopping  Avoid buying canned, pre-made, or processed foods. These foods tend to be high in fat, sodium, and added sugar.  Shop around the outside edge of the grocery store. This is where you will most often find fresh fruits and vegetables, bulk  grains, fresh meats, and fresh dairy. Cooking  Use low-heat cooking methods, such as baking, instead of high-heat cooking methods like deep frying.  Cook using healthy oils, such as olive, canola, or sunflower oil.  Avoid cooking with butter, cream, or high-fat meats. Meal planning  Eat meals and snacks regularly, preferably at the same times every day. Avoid going long periods of time without eating.  Eat foods that are high in fiber, such as fresh fruits, vegetables, beans, and whole grains. Talk with your dietitian about how many servings of carbs you can eat at each meal.  Eat 4-6 oz (112-168 g) of lean protein each day, such as lean meat, chicken, fish, eggs, or tofu. One ounce (oz) of lean protein is equal to: ? 1 oz (28 g) of meat, chicken, or fish. ? 1 egg. ?  cup (62 g) of tofu.  Eat some foods each day that contain healthy fats, such as avocado, nuts, seeds, and fish.   What foods should I eat? Fruits Berries. Apples. Oranges. Peaches. Apricots. Plums. Grapes. Mango. Papaya. Pomegranate. Kiwi. Cherries. Vegetables Lettuce. Spinach. Leafy greens, including kale, chard, collard greens, and mustard greens. Beets. Cauliflower. Cabbage. Broccoli. Carrots. Green beans. Tomatoes. Peppers. Onions. Cucumbers. Brussels sprouts. Grains Whole grains, such as whole-wheat or whole-grain bread, crackers, tortillas, cereal, and pasta. Unsweetened oatmeal. Quinoa. Kiannah Grunow or wild rice. Meats and other proteins Seafood. Poultry without skin. Lean cuts of poultry and beef. Tofu. Nuts. Seeds. Dairy Low-fat or fat-free dairy products such as milk, yogurt, and cheese. The  items listed above may not be a complete list of foods and beverages you can eat. Contact a dietitian for more information. What foods should I avoid? Fruits Fruits canned with syrup. Vegetables Canned vegetables. Frozen vegetables with butter or cream sauce. Grains Refined white flour and flour products such as bread,  pasta, snack foods, and cereals. Avoid all processed foods. Meats and other proteins Fatty cuts of meat. Poultry with skin. Breaded or fried meats. Processed meat. Avoid saturated fats. Dairy Full-fat yogurt, cheese, or milk. Beverages Sweetened drinks, such as soda or iced tea. The items listed above may not be a complete list of foods and beverages you should avoid. Contact a dietitian for more information. Questions to ask a health care provider  Do I need to meet with a diabetes educator?  Do I need to meet with a dietitian?  What number can I call if I have questions?  When are the best times to check my blood glucose? Where to find more information:  American Diabetes Association: diabetes.org  Academy of Nutrition and Dietetics: www.eatright.[employer] of Diabetes and Digestive and Kidney Diseases: [employer]  Association of Diabetes Care and Education Specialists: www.diabeteseducator.org Summary  It is important to have healthy eating habits because your blood sugar (glucose) levels are greatly affected by what you eat and drink.  A healthy meal plan will help you control your blood glucose and maintain a healthy lifestyle.  Your health care provider may recommend that you work with a dietitian to make a meal plan that is best for you.  Keep in mind that carbohydrates (carbs) and alcohol have immediate effects on your blood glucose levels. It is important to count carbs and to use alcohol carefully. This information is not intended to replace advice given to you by your health care provider. Make sure you discuss any questions you have with your health care provider. Document Revised: 04/16/2019 Document Reviewed: 04/16/2019 Elsevier Patient Education  2021 [employer].

## 2020-10-23 ENCOUNTER — Other Ambulatory Visit: Payer: Self-pay | Admitting: Legal Medicine

## 2020-10-23 ENCOUNTER — Telehealth: Payer: Self-pay

## 2020-10-23 DIAGNOSIS — I69354 Hemiplegia and hemiparesis following cerebral infarction affecting left non-dominant side: Secondary | ICD-10-CM

## 2020-10-23 NOTE — Progress Notes (Signed)
Chronic Care Management Pharmacy Assistant   Name: Shannon Erickson  MRN: [id number] DOB: Dec 14, 1975   Reason for Encounter: Disease State for diabetes  Recent office visits:  09/09/20-[facility] CPP, Patient is working on diet for recent Diabetes diet. Please consider updating diagnosis codes to DM from prediabetes. Patient has never taken or filled the metformin that was on her medication list. She would like to wait until after blood work in June to consider medication.   07/20/20, Dr. 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 They are stale. He is coping with this deficit by cane, follow up 4 mos,  INR good 2.2 continue present dose.   Recent consult visits:  none  Hospital visits:  None in previous 6 months  Medications: Outpatient Encounter Medications as of 10/23/2020  Medication Sig  . atorvastatin (LIPITOR) 80 MG tablet Take 1 tablet (80 mg total) by mouth daily.  Marland Kitchen gabapentin (NEURONTIN) 300 MG capsule Take 1 capsule by mouth twice daily (Patient taking differently: Take 300 mg by mouth 2 (two) times daily.)  . levETIRAcetam (KEPPRA) 500 MG tablet TAKE 1 & 1/2 (ONE & ONE-HALF) TABLETS BY MOUTH TWICE DAILY  . metFORMIN (GLUCOPHAGE) 500 MG tablet Take 1 tablet (500 mg total) by mouth 2 (two) times daily with a meal. (Patient not taking: Reported on 09/09/2020)  . nitroGLYCERIN (NITROSTAT) 0.4 MG SL tablet Place 1 tablet under the tongue every 5 (five) minutes as needed.  . warfarin (COUMADIN) 1 MG tablet Take 1 tablet (1 mg total) by mouth daily for 1 dose. (Patient not taking: Reported on 09/09/2020)  . warfarin (COUMADIN) 6 MG tablet TAKE 1 TABLET BY MOUTH ONCE DAILY FOR 1 DOSE. TAKE 1 TAB WITH 1 MG TAB FOR A TOTAL OF 7 MG DAILY AT 6 PM (Patient taking differently: Take 6 mg by mouth daily.)   No facility-administered encounter medications on file as of 10/23/2020.    Recent Relevant Labs: Lab Results   Component Value Date/Time   HGBA1C 6.7 (H) 07/20/2020 11:39 AM   HGBA1C 6.3 (H) 03/17/2020 10:36 AM    Kidney Function Lab Results  Component Value Date/Time   CREATININE 0.81 07/20/2020 11:39 AM   CREATININE 0.82 03/17/2020 10:36 AM   GFRNONAA 88 03/17/2020 10:36 AM   GFRAA 101 03/17/2020 10:36 AM     . Current antihyperglycemic regimen:  Metformin   Patient verbally confirms she is taking the above medications as directed. No,  She still wanted to wait till after her June bloodwork  . What recent interventions/DTPs have been made to improve glycemic control:   Patient stated she walks everyday and is watching her diet  . Have there been any recent hospitalizations or ED visits since last visit with CPP? No  . Patient denies hypoglycemic symptoms, including None  . Patient denies hyperglycemic symptoms, including none  . How often are you checking your blood sugar? Patient is not currently checking her blood sugars  . What are your blood sugars ranging? Patient is not checking  . During the week, how often does your blood glucose drop below 70? Never  . Are you checking your feet daily/regularly? Yes, patient stated she does check her feet   Adherence Review: Is the patient currently on a STATIN medication? Yes Is the patient currently on ACE/ARB medication? No Does the patient have >5 day gap between last estimated fill dates? CPP to review  Star Rating Drugs:  Medication:  Last Fill: Day Supply Atorvastatin  09/27/20  90   Leilani Able, [place] Clinical Pharmacist Assistant [phone]

## 2020-11-17 ENCOUNTER — Ambulatory Visit (INDEPENDENT_AMBULATORY_CARE_PROVIDER_SITE_OTHER): Payer: Medicare Other | Admitting: Legal Medicine

## 2020-11-17 ENCOUNTER — Other Ambulatory Visit: Payer: Self-pay

## 2020-11-17 ENCOUNTER — Encounter: Payer: Self-pay | Admitting: Legal Medicine

## 2020-11-17 VITALS — BP 120/62 | HR 101 | Temp 98.4°F | Resp 15 | Ht 63.0 in | Wt 208.0 lb

## 2020-11-17 DIAGNOSIS — I69354 Hemiplegia and hemiparesis following cerebral infarction affecting left non-dominant side: Secondary | ICD-10-CM

## 2020-11-17 DIAGNOSIS — D6861 Antiphospholipid syndrome: Secondary | ICD-10-CM

## 2020-11-17 DIAGNOSIS — I10 Essential (primary) hypertension: Secondary | ICD-10-CM

## 2020-11-17 DIAGNOSIS — G43709 Chronic migraine without aura, not intractable, without status migrainosus: Secondary | ICD-10-CM

## 2020-11-17 DIAGNOSIS — E1169 Type 2 diabetes mellitus with other specified complication: Secondary | ICD-10-CM | POA: Insufficient documentation

## 2020-11-17 DIAGNOSIS — E782 Mixed hyperlipidemia: Secondary | ICD-10-CM

## 2020-11-17 DIAGNOSIS — R7303 Prediabetes: Secondary | ICD-10-CM | POA: Diagnosis not present

## 2020-11-17 DIAGNOSIS — E669 Obesity, unspecified: Secondary | ICD-10-CM | POA: Insufficient documentation

## 2020-11-17 DIAGNOSIS — E1159 Type 2 diabetes mellitus with other circulatory complications: Secondary | ICD-10-CM

## 2020-11-17 DIAGNOSIS — I69319 Unspecified symptoms and signs involving cognitive functions following cerebral infarction: Secondary | ICD-10-CM

## 2020-11-17 DIAGNOSIS — I152 Hypertension secondary to endocrine disorders: Secondary | ICD-10-CM

## 2020-11-17 DIAGNOSIS — Z6836 Body mass index (BMI) 36.0-36.9, adult: Secondary | ICD-10-CM

## 2020-11-17 MED ORDER — OZEMPIC (0.25 OR 0.5 MG/DOSE) 2 MG/1.5ML ~~LOC~~ SOPN: 0.5000 mg | PEN_INJECTOR | SUBCUTANEOUS | 3 refills | Status: DC

## 2020-11-17 NOTE — Progress Notes (Signed)
Established Patient Office Visit  Subjective:  Patient ID: Shannon Erickson, female    DOB: 10-Mar-1976  Age: 45 y.o. MRN: [id number]  CC:  Chief Complaint  Patient presents with   Hypertension   Hyperlipidemia   Prediabetes    HPI Shannon Erickson presents for Chronic visit  Patient presents for follow up of hypertension.  Patient tolerating diet well with side effects.  Patient was diagnosed with hypertension 2010 so has been treated for hypertension for 10 years.Patient is working on maintaining diet and exercise regimen and follows up as directed. Complication include none.   Patient presents with hyperlipidemia.  Compliance with treatment has been good; patient takes medicines as directed, maintains low cholesterol diet, follows up as directed, and maintains exercise regimen.  Patient is using atorvastatin without problems.   Past Medical History:  Diagnosis Date   Hypertension    Lesion of left ulnar nerve    Migraine    Seizures ([place])    Stroke [facility])     Past Surgical History:  Procedure Laterality Date   CESAREAN SECTION Bilateral 1995, 2002   CHOLECYSTECTOMY     LYMPH NODE BIOPSY     NO PAST SURGERIES     TEE WITHOUT CARDIOVERSION N/A 12/05/2016   Procedure: TRANSESOPHAGEAL ECHOCARDIOGRAM (TEE);  Surgeon: Acie Fredrickson, Wonda Cheng, MD;  Location: [facility] ENDOSCOPY;  Service: Cardiovascular;  Laterality: N/A;    Family History  Problem Relation Age of Onset   Lung cancer Mother        mets to brain, bone, liver   Hypertension Father    Heart attack Brother        at age 17   Renal cancer Maternal Grandfather    Heart attack Paternal Grandfather     Social History   Socioeconomic History   Marital status: Single    Spouse name: Not on file   Number of children: 3   Years of education: 12   Highest education level: Not on file  Occupational History   Not on file  Tobacco Use   Smoking status: Never   Smokeless tobacco: Never  Vaping Use   Vaping Use: Never  used  Substance and Sexual Activity   Alcohol use: No   Drug use: No   Sexual activity: Not on file  Other Topics Concern   Not on file  Social History Narrative   Not on file   Social Determinants of Health   Financial Resource Strain: Not on file  Food Insecurity: No Food Insecurity   Worried About Running Out of Food in the Last Year: Never true   Ran Out of Food in the Last Year: Never true  Transportation Needs: Not on file  Physical Activity: Not on file  Stress: Not on file  Social Connections: Not on file  Intimate Partner Violence: Not on file    Outpatient Medications Prior to Visit  Medication Sig Dispense Refill   atorvastatin (LIPITOR) 80 MG tablet Take 1 tablet (80 mg total) by mouth daily. 30 tablet 6   gabapentin (NEURONTIN) 300 MG capsule Take 1 capsule by mouth twice daily 60 capsule 4   levETIRAcetam (KEPPRA) 500 MG tablet TAKE 1 & 1/2 (ONE & ONE-HALF) TABLETS BY MOUTH TWICE DAILY 90 tablet 6   nitroGLYCERIN (NITROSTAT) 0.4 MG SL tablet Place 1 tablet under the tongue every 5 (five) minutes as needed.  0   warfarin (COUMADIN) 6 MG tablet TAKE 1 TABLET BY MOUTH ONCE DAILY FOR 1 DOSE. TAKE 1 TAB  WITH 1 MG TAB FOR A TOTAL OF 7 MG DAILY AT 6 PM (Patient taking differently: Take 6 mg by mouth daily.) 30 tablet 6   metFORMIN (GLUCOPHAGE) 500 MG tablet Take 1 tablet (500 mg total) by mouth 2 (two) times daily with a meal. (Patient not taking: Reported on 09/09/2020) 60 tablet 2   warfarin (COUMADIN) 1 MG tablet Take 1 tablet (1 mg total) by mouth daily for 1 dose. (Patient not taking: Reported on 09/09/2020) 30 tablet 3   No facility-administered medications prior to visit.    Allergies  Allergen Reactions   Penicillins Hives    Has patient had a PCN reaction causing immediate rash, facial/tongue/throat swelling, SOB or lightheadedness with hypotension: Yes Has patient had a PCN reaction causing severe rash involving mucus membranes or skin necrosis: Yes Has patient  had a PCN reaction that required hospitalization: Unk Has patient had a PCN reaction occurring within the last 10 years: No If all of the above answers are "NO", then may proceed with Cephalosporin use.     ROS Review of Systems  Constitutional:  Negative for activity change and appetite change.  HENT:  Negative for congestion.   Eyes:  Negative for visual disturbance.  Respiratory:  Negative for chest tightness and shortness of breath.   Cardiovascular:  Negative for chest pain, palpitations and leg swelling.  Gastrointestinal:  Negative for abdominal distention and abdominal pain.  Genitourinary: Negative.   Musculoskeletal:  Negative for arthralgias and back pain.  Neurological: Negative.   Psychiatric/Behavioral: Negative.       Objective:    Physical Exam Vitals reviewed.  Constitutional:      Appearance: Normal appearance.  HENT:     Head: Normocephalic.     Right Ear: Tympanic membrane, ear canal and external ear normal.     Left Ear: Tympanic membrane, ear canal and external ear normal.     Nose: Nose normal.     Mouth/Throat:     Mouth: Mucous membranes are moist.     Pharynx: Oropharynx is clear.  Eyes:     Extraocular Movements: Extraocular movements intact.     Conjunctiva/sclera: Conjunctivae normal.     Pupils: Pupils are equal, round, and reactive to light.  Cardiovascular:     Rate and Rhythm: Normal rate and regular rhythm.     Pulses: Normal pulses.     Heart sounds: Normal heart sounds. No murmur heard.   No gallop.  Pulmonary:     Effort: Pulmonary effort is normal. No respiratory distress.     Breath sounds: Normal breath sounds. No wheezing.  Abdominal:     General: Abdomen is flat. Bowel sounds are normal. There is no distension.     Tenderness: There is no abdominal tenderness.  Musculoskeletal:        General: Normal range of motion.     Cervical back: Normal range of motion and neck supple.  Skin:    General: Skin is warm and dry.      Capillary Refill: Capillary refill takes less than 2 seconds.  Neurological:     General: No focal deficit present.     Mental Status: She is alert and oriented to person, place, and time. Mental status is at baseline.  Psychiatric:        Mood and Affect: Mood normal.        Thought Content: Thought content normal.    BP 120/62   Pulse (!) 101   Temp 98.4 F (36.9 C)  Resp 15   Ht 5' 3" (1.6 m)   Wt 208 lb (94.3 kg)   SpO2 96%   BMI 36.85 kg/m  Wt Readings from Last 3 Encounters:  11/17/20 208 lb (94.3 kg)  07/20/20 205 lb 6.4 oz (93.2 kg)  03/17/20 203 lb (92.1 kg)     Health Maintenance Due  Topic Date Due   PNEUMOCOCCAL POLYSACCHARIDE VACCINE AGE 37-64 HIGH RISK  Never done   OPHTHALMOLOGY EXAM  Never done   URINE MICROALBUMIN  Never done   Hepatitis C Screening  Never done   TETANUS/TDAP  Never done   PAP SMEAR-Modifier  Never done   COVID-19 Vaccine (2 - Moderna series) 03/13/2020    There are no preventive care reminders to display for this patient.  Lab Results  Component Value Date   TSH 1.119 12/03/2016   Lab Results  Component Value Date   WBC 8.4 07/20/2020   HGB 14.1 07/20/2020   HCT 41.6 07/20/2020   MCV 88 07/20/2020   PLT 276 07/20/2020   Lab Results  Component Value Date   NA 139 07/20/2020   K 4.2 07/20/2020   CO2 21 07/20/2020   GLUCOSE 108 (H) 07/20/2020   BUN 10 07/20/2020   CREATININE 0.81 07/20/2020   BILITOT 0.6 07/20/2020   ALKPHOS 160 (H) 07/20/2020   AST 54 (H) 07/20/2020   ALT 85 (H) 07/20/2020   PROT 6.9 07/20/2020   ALBUMIN 3.4 (L) 07/20/2020   CALCIUM 8.9 07/20/2020   ANIONGAP 9 06/24/2017   EGFR 92 07/20/2020   Lab Results  Component Value Date   CHOL 139 07/20/2020   Lab Results  Component Value Date   HDL 42 07/20/2020   Lab Results  Component Value Date   LDLCALC 76 07/20/2020   Lab Results  Component Value Date   TRIG 113 07/20/2020   Lab Results  Component Value Date   CHOLHDL 3.3 07/20/2020    Lab Results  Component Value Date   HGBA1C 6.7 (H) 07/20/2020      Assessment & Plan:   Problem List Items Addressed This Visit       Cardiovascular and Mediastinum   Chronic migraine without aura without status migrainosus, not intractable AN INDIVIDUAL CARE PLAN for migraine was established and reinforced today.  The patient's status was assessed using clinical findings on exam, labs, and other diagnostic testing. Patient's success at meeting treatment goals based on disease specific evidence-bassed guidelines and found to be in good control. RECOMMENDATIONS include maintaining present medicines and treatment.    Cognitive deficit due to recent stroke Patient was evaluated using information from exam, tests and other diagnostic studies to perform evidence-based treatment for this disorder.  Opimizing treatment and improvement of neurologic deficits.  Patient is using cane to maintain as much independence as possible. Patient using cane.  Patient has full ability to perform ADLs.     Obesity, diabetes, and hypertension syndrome (HCC)   Relevant Medications   Semaglutide,0.25 or 0.5MG/DOS, (OZEMPIC, 0.25 OR 0.5 MG/DOSE,) 2 MG/1.5ML SOPN An individual care plan for diabetes was established and reinforced today.  The patient's status was assessed using clinical findings on exam, labs and diagnostic testing. Patient success at meeting goals based on disease specific evidence-based guidelines and found to be fair controlled. Medications were assessed and patient's understanding of the medical issues , including barriers were assessed. Recommend adherence to a diabetic diet, a graduated exercise program, HgbA1c level is checked quarterly, and urine microalbumin performed yearly .  Annual mono-filament sensation testing performed. Lower blood pressure and control hyperlipidemia is important. Get annual eye exams and annual flu shots and smoking cessation discussed.  Self management goals were  discussed.      Nervous and Auditory   Hemiparesis affecting left side as late effect of cerebrovascular accident [facility]) Patient has left hemiparesis from stroke that is stable     Hematopoietic and Hemostatic   Antiphospholipid syndrome ([place])   Relevant Orders   Protime-INR Patient has antiphospholipid syndrome and is on chronic anticoagulation     Other   Hyperlipidemia   Relevant Orders   Lipid panel AN INDIVIDUAL CARE PLAN for hyperlipidemia/ cholesterol was established and reinforced today.  The patient's status was assessed using clinical findings on exam, lab and other diagnostic tests. The patient's disease status was assessed based on evidence-based guidelines and found to be fair controlled. MEDICATIONS were reviewed. SELF MANAGEMENT GOALS have been discussed and patient's success at attaining the goal of low cholesterol was assessed. RECOMMENDATION given include regular exercise 3 days a week and low cholesterol/low fat diet. CLINICAL SUMMARY including written plan to identify barriers unique to the patient due to social or economic  reasons was discussed.     BMI 36.0-36.9,adult An individualize plan was formulated for obesity using patient history and physical exam to encourage weight loss.  An evidence based program was formulated.  Patient is to cut portion size with meals and to plan physical exercise 3 days a week at least 20 minutes.  Weight watchers and other programs are helpful.  Planned amount of weight loss 10 lbs. With her other problems, patient meets criteria for morbid obesity    Morbid obesity ([place])   Relevant Medications   Semaglutide,0.25 or 0.5MG/DOS, (OZEMPIC, 0.25 OR 0.5 MG/DOSE,) 2 MG/1.5ML SOPN An individualize plan was formulated for obesity using patient history and physical exam to encourage weight loss.  An evidence based program was formulated.  Patient is to cut portion size with meals and to plan physical exercise 3 days a week at least 20 minutes.   Weight watchers and other programs are helpful.  Planned amount of weight loss 10 lbs.    Other Visit Diagnoses     Essential hypertension    -  Primary   Relevant Orders   Comprehensive metabolic panel   CBC with Differential/Platelet An individual hypertension care plan was established and reinforced today.  The patient's status was assessed using clinical findings on exam and labs or diagnostic tests. The patient's success at meeting treatment goals on disease specific evidence-based guidelines and found to be well controlled. SELF MANAGEMENT: The patient and I together assessed ways to personally work towards obtaining the recommended goals. RECOMMENDATIONS: avoid decongestants found in common cold remedies, decrease consumption of alcohol, perform routine monitoring of BP with home BP cuff, exercise, reduction of dietary salt, take medicines as prescribed, try not to miss doses and quit smoking.  Regular exercise and maintaining a healthy weight is needed.  Stress reduction may help. A CLINICAL SUMMARY including written plan identify barriers to care unique to individual due to social or financial issues.  We attempt to mutually creat solutions for individual and family understanding.     Prediabetes       Relevant Orders   Hemoglobin A1c Patient has prediabetes and is on diet         Follow-up: Return in about 4 months (around 03/19/2021) for fasting.    Reinaldo Meeker, MD

## 2020-11-18 LAB — COMPREHENSIVE METABOLIC PANEL
ALT: 109 IU/L — ABNORMAL HIGH (ref 0–32)
AST: 82 IU/L — ABNORMAL HIGH (ref 0–40)
Albumin/Globulin Ratio: 1 — ABNORMAL LOW (ref 1.2–2.2)
Albumin: 3.4 g/dL — ABNORMAL LOW (ref 3.8–4.8)
Alkaline Phosphatase: 173 IU/L — ABNORMAL HIGH (ref 44–121)
BUN/Creatinine Ratio: 14 (ref 9–23)
BUN: 11 mg/dL (ref 6–24)
Bilirubin Total: 0.6 mg/dL (ref 0.0–1.2)
CO2: 20 mmol/L (ref 20–29)
Calcium: 8.4 mg/dL — ABNORMAL LOW (ref 8.7–10.2)
Chloride: 102 mmol/L (ref 96–106)
Creatinine, Ser: 0.79 mg/dL (ref 0.57–1.00)
Globulin, Total: 3.5 g/dL (ref 1.5–4.5)
Glucose: 109 mg/dL — ABNORMAL HIGH (ref 65–99)
Potassium: 4.2 mmol/L (ref 3.5–5.2)
Sodium: 139 mmol/L (ref 134–144)
Total Protein: 6.9 g/dL (ref 6.0–8.5)
eGFR: 95 mL/min/{1.73_m2} (ref >59)

## 2020-11-18 LAB — CBC WITH DIFFERENTIAL/PLATELET
Basophils Absolute: 0.1 10*3/uL (ref 0.0–0.2)
Basos: 1 %
EOS (ABSOLUTE): 0.1 10*3/uL (ref 0.0–0.4)
Eos: 1 %
Hematocrit: 43.4 % (ref 34.0–46.6)
Hemoglobin: 13.3 g/dL (ref 11.1–15.9)
Immature Grans (Abs): 0 10*3/uL (ref 0.0–0.1)
Immature Granulocytes: 0 %
Lymphocytes Absolute: 2.1 10*3/uL (ref 0.7–3.1)
Lymphs: 31 %
MCH: 25.8 pg — ABNORMAL LOW (ref 26.6–33.0)
MCHC: 30.6 g/dL — ABNORMAL LOW (ref 31.5–35.7)
MCV: 84 fL (ref 79–97)
Monocytes Absolute: 0.5 10*3/uL (ref 0.1–0.9)
Monocytes: 7 %
Neutrophils Absolute: 4 10*3/uL (ref 1.4–7.0)
Neutrophils: 60 %
Platelets: 281 10*3/uL (ref 150–450)
RBC: 5.16 x10E6/uL (ref 3.77–5.28)
RDW: 13.3 % (ref 11.7–15.4)
WBC: 6.7 10*3/uL (ref 3.4–10.8)

## 2020-11-18 LAB — LIPID PANEL
Chol/HDL Ratio: 3.2 ratio (ref 0.0–4.4)
Cholesterol, Total: 145 mg/dL (ref 100–199)
HDL: 46 mg/dL (ref >39)
LDL Chol Calc (NIH): 73 mg/dL (ref 0–99)
Triglycerides: 152 mg/dL — ABNORMAL HIGH (ref 0–149)
VLDL Cholesterol Cal: 26 mg/dL (ref 5–40)

## 2020-11-18 LAB — HEMOGLOBIN A1C
Est. average glucose Bld gHb Est-mCnc: 157 mg/dL
Hgb A1c MFr Bld: 7.1 % — ABNORMAL HIGH (ref 4.8–5.6)

## 2020-11-18 LAB — CARDIOVASCULAR RISK ASSESSMENT

## 2020-11-18 LAB — PROTIME-INR
INR: 3.3 — ABNORMAL HIGH (ref 0.9–1.2)
Prothrombin Time: 33.4 s — ABNORMAL HIGH (ref 9.1–12.0)

## 2020-11-18 NOTE — Progress Notes (Signed)
Glucose 109, kidney tests normal, calcium remains high, liver tests remain elevated, A1c 7.1, triglycerides high, CBC ok, protime INR 3.3 high, cut down one mg a day and recheck in one week lp

## 2020-11-19 ENCOUNTER — Other Ambulatory Visit: Payer: Self-pay

## 2020-11-19 DIAGNOSIS — D6861 Antiphospholipid syndrome: Secondary | ICD-10-CM

## 2020-11-19 MED ORDER — WARFARIN SODIUM 5 MG PO TABS: 5.0000 mg | ORAL_TABLET | Freq: Every day | ORAL | 2 refills | Status: DC

## 2020-11-26 ENCOUNTER — Other Ambulatory Visit: Payer: Medicare Other

## 2020-11-26 DIAGNOSIS — D6861 Antiphospholipid syndrome: Secondary | ICD-10-CM | POA: Diagnosis not present

## 2020-11-27 LAB — PROTIME-INR
INR: 1.2 (ref 0.9–1.2)
Prothrombin Time: 12 s (ref 9.1–12.0)

## 2020-12-03 ENCOUNTER — Other Ambulatory Visit: Payer: Medicare Other

## 2020-12-03 ENCOUNTER — Other Ambulatory Visit: Payer: Self-pay

## 2020-12-03 DIAGNOSIS — D6861 Antiphospholipid syndrome: Secondary | ICD-10-CM

## 2020-12-04 LAB — PROTIME-INR
INR: 1.9 — ABNORMAL HIGH (ref 0.9–1.2)
Prothrombin Time: 19.8 s — ABNORMAL HIGH (ref 9.1–12.0)

## 2020-12-04 NOTE — Progress Notes (Signed)
INR 1.9 continue same dose lp

## 2021-02-02 ENCOUNTER — Telehealth (INDEPENDENT_AMBULATORY_CARE_PROVIDER_SITE_OTHER): Payer: Medicare Other | Admitting: Legal Medicine

## 2021-02-02 ENCOUNTER — Encounter: Payer: Self-pay | Admitting: Legal Medicine

## 2021-02-02 DIAGNOSIS — J111 Influenza due to unidentified influenza virus with other respiratory manifestations: Secondary | ICD-10-CM | POA: Insufficient documentation

## 2021-02-02 MED ORDER — OSELTAMIVIR PHOSPHATE 75 MG PO CAPS: 75.0000 mg | ORAL_CAPSULE | Freq: Two times a day (BID) | ORAL | 0 refills | Status: DC

## 2021-02-02 NOTE — Progress Notes (Signed)
Virtual Visit via Video Note   This visit type was conducted due to national recommendations for restrictions regarding the COVID-19 Pandemic (e.g. social distancing) in an effort to limit this patient's exposure and mitigate transmission in our community.  Due to her co-morbid illnesses, this patient is at least at moderate risk for complications without adequate follow up.  This format is felt to be most appropriate for this patient at this time.  All issues noted in this document were discussed and addressed.  A limited physical exam was performed with this format.  A verbal consent was obtained for the virtual visit.   Date:  02/02/2021   ID:  Shannon Erickson, DOB [birth year], MRN [id number]  Patient Location: Home Provider Location: Office/Clinic  PCP:  Abigail Miyamoto, MD   Evaluation Performed:  New Patient Evaluation  Chief Complaint:  son had flu 2 days ago  History of Present Illness:    Shannon Erickson is a 45 y.o. female with  symptoms of aching congestion, diffuse malgias  The patient does not have symptoms concerning for COVID-19 infection (fever, chills, cough, or new shortness of breath).    Past Medical History:  Diagnosis Date   Hypertension    Lesion of left ulnar nerve    Migraine    Seizures (HCC)    Stroke [facility])     Past Surgical History:  Procedure Laterality Date   CESAREAN SECTION Bilateral 1995, 2002   CHOLECYSTECTOMY     LYMPH NODE BIOPSY     NO PAST SURGERIES     TEE WITHOUT CARDIOVERSION N/A 12/05/2016   Procedure: TRANSESOPHAGEAL ECHOCARDIOGRAM (TEE);  Surgeon: Elease Hashimoto, Deloris Ping, MD;  Location: [facility] ENDOSCOPY;  Service: Cardiovascular;  Laterality: N/A;    Family History  Problem Relation Age of Onset   Lung cancer Mother        mets to brain, bone, liver   Hypertension Father    Heart attack Brother        at age 24   Renal cancer Maternal Grandfather    Heart attack Paternal Grandfather     Social History   Socioeconomic  History   Marital status: Single    Spouse name: Not on file   Number of children: 3   Years of education: 12   Highest education level: Not on file  Occupational History   Not on file  Tobacco Use   Smoking status: Never   Smokeless tobacco: Never  Vaping Use   Vaping Use: Never used  Substance and Sexual Activity   Alcohol use: No   Drug use: No   Sexual activity: Not on file  Other Topics Concern   Not on file  Social History Narrative   Not on file   Social Determinants of Health   Financial Resource Strain: Not on file  Food Insecurity: No Food Insecurity   Worried About Running Out of Food in the Last Year: Never true   Ran Out of Food in the Last Year: Never true  Transportation Needs: Not on file  Physical Activity: Not on file  Stress: Not on file  Social Connections: Not on file  Intimate Partner Violence: Not on file    Outpatient Medications Prior to Visit  Medication Sig Dispense Refill   atorvastatin (LIPITOR) 80 MG tablet Take 1 tablet (80 mg total) by mouth daily. 30 tablet 6   gabapentin (NEURONTIN) 300 MG capsule Take 1 capsule by mouth twice daily 60 capsule 4   levETIRAcetam (KEPPRA) 500  MG tablet TAKE 1 & 1/2 (ONE & ONE-HALF) TABLETS BY MOUTH TWICE DAILY 90 tablet 6   nitroGLYCERIN (NITROSTAT) 0.4 MG SL tablet Place 1 tablet under the tongue every 5 (five) minutes as needed.  0   Semaglutide,0.25 or 0.5MG /DOS, (OZEMPIC, 0.25 OR 0.5 MG/DOSE,) 2 MG/1.5ML SOPN Inject 0.5 mg into the skin once a week. 6 mL 3   warfarin (COUMADIN) 5 MG tablet Take 1 tablet (5 mg total) by mouth daily. 30 tablet 2   No facility-administered medications prior to visit.    Allergies:   Penicillins   Social History   Tobacco Use   Smoking status: Never   Smokeless tobacco: Never  Vaping Use   Vaping Use: Never used  Substance Use Topics   Alcohol use: No   Drug use: No     Review of Systems  Constitutional:  Negative for chills and fever.  HENT:  Positive for  congestion.   Respiratory:  Positive for cough.   Cardiovascular:  Negative for chest pain and claudication.  Musculoskeletal:  Positive for myalgias.  Neurological:  Positive for headaches.    Labs/Other Tests and Data Reviewed:    Recent Labs: 11/17/2020: ALT 109; BUN 11; Creatinine, Ser 0.79; Hemoglobin 13.3; Platelets 281; Potassium 4.2; Sodium 139   Recent Lipid Panel Lab Results  Component Value Date/Time   CHOL 145 11/17/2020 02:47 PM   TRIG 152 (H) 11/17/2020 02:47 PM   HDL 46 11/17/2020 02:47 PM   CHOLHDL 3.2 11/17/2020 02:47 PM   CHOLHDL 4.7 12/04/2016 04:19 AM   LDLCALC 73 11/17/2020 02:47 PM    Wt Readings from Last 3 Encounters:  02/02/21 207 lb (93.9 kg)  11/17/20 208 lb (94.3 kg)  07/20/20 205 lb 6.4 oz (93.2 kg)     Objective:    Vital Signs:  Ht 5\' 4"  (1.626 m)   Wt 207 lb (93.9 kg)   BMI 35.53 kg/m    Physical Exam reviewed  ASSESSMENT & PLAN:   Diagnoses and all orders for this visit: Influenza -     oseltamivir (TAMIFLU) 75 MG capsule; Take 1 capsule (75 mg total) by mouth 2 (two) times daily.  Patient has flu-like symptoms and exposed to a flu positive person 2 days ago.  Treat with tamiflu      COVID-19 Education: The signs and symptoms of COVID-19 were discussed with the patient and how to seek care for testing (follow up with PCP or arrange E-visit). The importance of social distancing was discussed today.   I spent dedicated to the care of this patient on the date of this encounter to include face-to-face time with the patient, as well as:   Follow Up:  In Person prn  Signed, , MD  02/02/2021 1:17 PM    [facility]

## 2021-02-02 NOTE — Progress Notes (Deleted)
Acute Office Visit  Subjective:    Patient ID: Shannon Erickson, female    DOB: October 31, 1975, 45 y.o.   MRN: [id number]  Chief Complaint  Patient presents with   Nasal Congestion        Generalized Body Aches    HPI Patient is doing a my chart visit today for congestion, sinus pressure, chills. Patient states all her symptoms started today.  Past Medical History:  Diagnosis Date   Hypertension    Lesion of left ulnar nerve    Migraine    Seizures ([place])    Stroke [facility])     Past Surgical History:  Procedure Laterality Date   CESAREAN SECTION Bilateral 1995, 2002   CHOLECYSTECTOMY     LYMPH NODE BIOPSY     NO PAST SURGERIES     TEE WITHOUT CARDIOVERSION N/A 12/05/2016   Procedure: TRANSESOPHAGEAL ECHOCARDIOGRAM (TEE);  Surgeon: Acie Fredrickson, Wonda Cheng, MD;  Location: [facility] ENDOSCOPY;  Service: Cardiovascular;  Laterality: N/A;    Family History  Problem Relation Age of Onset   Lung cancer Mother        mets to brain, bone, liver   Hypertension Father    Heart attack Brother        at age 61   Renal cancer Maternal Grandfather    Heart attack Paternal Grandfather     Social History   Socioeconomic History   Marital status: Single    Spouse name: Not on file   Number of children: 3   Years of education: 12   Highest education level: Not on file  Occupational History   Not on file  Tobacco Use   Smoking status: Never   Smokeless tobacco: Never  Vaping Use   Vaping Use: Never used  Substance and Sexual Activity   Alcohol use: No   Drug use: No   Sexual activity: Not on file  Other Topics Concern   Not on file  Social History Narrative   Not on file   Social Determinants of Health   Financial Resource Strain: Not on file  Food Insecurity: No Food Insecurity   Worried About Running Out of Food in the Last Year: Never true   Ran Out of Food in the Last Year: Never true  Transportation Needs: Not on file  Physical Activity: Not on file  Stress: Not on file   Social Connections: Not on file  Intimate Partner Violence: Not on file    Outpatient Medications Prior to Visit  Medication Sig Dispense Refill   atorvastatin (LIPITOR) 80 MG tablet Take 1 tablet (80 mg total) by mouth daily. 30 tablet 6   gabapentin (NEURONTIN) 300 MG capsule Take 1 capsule by mouth twice daily 60 capsule 4   levETIRAcetam (KEPPRA) 500 MG tablet TAKE 1 & 1/2 (ONE & ONE-HALF) TABLETS BY MOUTH TWICE DAILY 90 tablet 6   nitroGLYCERIN (NITROSTAT) 0.4 MG SL tablet Place 1 tablet under the tongue every 5 (five) minutes as needed.  0   Semaglutide,0.25 or 0.5MG/DOS, (OZEMPIC, 0.25 OR 0.5 MG/DOSE,) 2 MG/1.5ML SOPN Inject 0.5 mg into the skin once a week. 6 mL 3   warfarin (COUMADIN) 5 MG tablet Take 1 tablet (5 mg total) by mouth daily. 30 tablet 2   No facility-administered medications prior to visit.    Allergies  Allergen Reactions   Penicillins Hives    Has patient had a PCN reaction causing immediate rash, facial/tongue/throat swelling, SOB or lightheadedness with hypotension: Yes Has patient had a PCN  reaction causing severe rash involving mucus membranes or skin necrosis: Yes Has patient had a PCN reaction that required hospitalization: Unk Has patient had a PCN reaction occurring within the last 10 years: No If all of the above answers are "NO", then may proceed with Cephalosporin use.     Review of Systems  Constitutional:  Positive for chills. Negative for appetite change, fatigue and fever.  HENT:  Positive for congestion and sinus pressure. Negative for ear discharge, ear pain, rhinorrhea, sneezing and sore throat.   Eyes:  Negative for visual disturbance.  Respiratory:  Negative for cough, chest tightness, shortness of breath and wheezing.   Cardiovascular:  Negative for chest pain, palpitations and leg swelling.  Gastrointestinal:  Negative for abdominal pain, diarrhea, nausea and vomiting.  Endocrine: Negative for polydipsia, polyphagia and polyuria.   Genitourinary:  Negative for difficulty urinating, dysuria, frequency, hematuria, menstrual problem, urgency, vaginal bleeding, vaginal discharge and vaginal pain.  Musculoskeletal:  Negative for back pain, gait problem, joint swelling, myalgias and neck pain.  Neurological:  Negative for dizziness, seizures, syncope, weakness, numbness and headaches.  Psychiatric/Behavioral:  Negative for agitation, confusion, hallucinations, sleep disturbance and suicidal ideas. The patient is not nervous/anxious.       Objective:    Physical Exam  Ht 5' 4"  (1.626 m)   Wt 207 lb (93.9 kg)   BMI 35.53 kg/m  Wt Readings from Last 3 Encounters:  02/02/21 207 lb (93.9 kg)  11/17/20 208 lb (94.3 kg)  07/20/20 205 lb 6.4 oz (93.2 kg)    Health Maintenance Due  Topic Date Due   PNEUMOCOCCAL POLYSACCHARIDE VACCINE AGE 48-64 HIGH RISK  Never done   OPHTHALMOLOGY EXAM  Never done   URINE MICROALBUMIN  Never done   Hepatitis C Screening  Never done   TETANUS/TDAP  Never done   PAP SMEAR-Modifier  Never done   COVID-19 Vaccine (2 - Moderna series) 03/13/2020   INFLUENZA VACCINE  Never done    There are no preventive care reminders to display for this patient.   Lab Results  Component Value Date   TSH 1.119 12/03/2016   Lab Results  Component Value Date   WBC 6.7 11/17/2020   HGB 13.3 11/17/2020   HCT 43.4 11/17/2020   MCV 84 11/17/2020   PLT 281 11/17/2020   Lab Results  Component Value Date   NA 139 11/17/2020   K 4.2 11/17/2020   CO2 20 11/17/2020   GLUCOSE 109 (H) 11/17/2020   BUN 11 11/17/2020   CREATININE 0.79 11/17/2020   BILITOT 0.6 11/17/2020   ALKPHOS 173 (H) 11/17/2020   AST 82 (H) 11/17/2020   ALT 109 (H) 11/17/2020   PROT 6.9 11/17/2020   ALBUMIN 3.4 (L) 11/17/2020   CALCIUM 8.4 (L) 11/17/2020   ANIONGAP 9 06/24/2017   EGFR 95 11/17/2020   Lab Results  Component Value Date   CHOL 145 11/17/2020   Lab Results  Component Value Date   HDL 46 11/17/2020   Lab  Results  Component Value Date   LDLCALC 73 11/17/2020   Lab Results  Component Value Date   TRIG 152 (H) 11/17/2020   Lab Results  Component Value Date   CHOLHDL 3.2 11/17/2020   Lab Results  Component Value Date   HGBA1C 7.1 (H) 11/17/2020       Assessment & Plan:  There are no diagnoses linked to this encounter.   No orders of the defined types were placed in this encounter.   No  orders of the defined types were placed in this encounter.    I spent < time > minutes dedicated to the care of this patient on the date of this encounter to include face-to-face time with the patient, as well as: ***  Follow-up: No follow-ups on file.  An After Visit Summary was printed and given to the patient.  Reinaldo Meeker, MD [facility] [phone]

## 2021-02-23 ENCOUNTER — Other Ambulatory Visit: Payer: Self-pay | Admitting: Legal Medicine

## 2021-02-24 NOTE — Telephone Encounter (Signed)
Pt has been scheduled for her INR test for this Friday morning at 10:45.

## 2021-02-26 ENCOUNTER — Other Ambulatory Visit: Payer: Medicare Other

## 2021-02-26 ENCOUNTER — Other Ambulatory Visit: Payer: Self-pay

## 2021-02-26 DIAGNOSIS — D6861 Antiphospholipid syndrome: Secondary | ICD-10-CM

## 2021-02-27 LAB — PROTIME-INR
INR: 1.9 — ABNORMAL HIGH (ref 0.9–1.2)
Prothrombin Time: 19.6 s — ABNORMAL HIGH (ref 9.1–12.0)

## 2021-02-27 NOTE — Progress Notes (Signed)
INR 1.9 stable continue dose lp

## 2021-03-16 ENCOUNTER — Other Ambulatory Visit: Payer: Self-pay | Admitting: Family Medicine

## 2021-03-16 ENCOUNTER — Other Ambulatory Visit: Payer: Self-pay | Admitting: Legal Medicine

## 2021-03-16 DIAGNOSIS — E782 Mixed hyperlipidemia: Secondary | ICD-10-CM

## 2021-03-16 DIAGNOSIS — G40909 Epilepsy, unspecified, not intractable, without status epilepticus: Secondary | ICD-10-CM

## 2021-03-16 DIAGNOSIS — I69354 Hemiplegia and hemiparesis following cerebral infarction affecting left non-dominant side: Secondary | ICD-10-CM

## 2021-03-16 NOTE — Telephone Encounter (Signed)
Your patient. KC °

## 2021-03-22 ENCOUNTER — Ambulatory Visit (INDEPENDENT_AMBULATORY_CARE_PROVIDER_SITE_OTHER): Payer: Medicare Other | Admitting: Legal Medicine

## 2021-03-22 ENCOUNTER — Other Ambulatory Visit: Payer: Self-pay

## 2021-03-22 ENCOUNTER — Encounter: Payer: Self-pay | Admitting: Legal Medicine

## 2021-03-22 VITALS — BP 90/60 | HR 101 | Temp 98.1°F | Resp 16 | Ht 64.0 in | Wt 198.0 lb

## 2021-03-22 DIAGNOSIS — E1159 Type 2 diabetes mellitus with other circulatory complications: Secondary | ICD-10-CM | POA: Diagnosis not present

## 2021-03-22 DIAGNOSIS — I10 Essential (primary) hypertension: Secondary | ICD-10-CM

## 2021-03-22 DIAGNOSIS — G40909 Epilepsy, unspecified, not intractable, without status epilepticus: Secondary | ICD-10-CM

## 2021-03-22 DIAGNOSIS — D6861 Antiphospholipid syndrome: Secondary | ICD-10-CM | POA: Diagnosis not present

## 2021-03-22 DIAGNOSIS — I69354 Hemiplegia and hemiparesis following cerebral infarction affecting left non-dominant side: Secondary | ICD-10-CM | POA: Diagnosis not present

## 2021-03-22 DIAGNOSIS — E669 Obesity, unspecified: Secondary | ICD-10-CM | POA: Diagnosis not present

## 2021-03-22 DIAGNOSIS — E782 Mixed hyperlipidemia: Secondary | ICD-10-CM | POA: Diagnosis not present

## 2021-03-22 DIAGNOSIS — E1169 Type 2 diabetes mellitus with other specified complication: Secondary | ICD-10-CM

## 2021-03-22 DIAGNOSIS — I152 Hypertension secondary to endocrine disorders: Secondary | ICD-10-CM

## 2021-03-22 LAB — POCT UA - MICROALBUMIN: Microalbumin Ur, POC: 30 mg/L

## 2021-03-22 MED ORDER — WARFARIN SODIUM 5 MG PO TABS: 5.0000 mg | ORAL_TABLET | Freq: Every day | ORAL | 6 refills | Status: DC

## 2021-03-22 MED ORDER — LEVETIRACETAM 750 MG PO TABS: 750.0000 mg | ORAL_TABLET | Freq: Two times a day (BID) | ORAL | 6 refills | Status: DC

## 2021-03-22 MED ORDER — GABAPENTIN 300 MG PO CAPS: 300.0000 mg | ORAL_CAPSULE | Freq: Two times a day (BID) | ORAL | 6 refills | Status: DC

## 2021-03-22 MED ORDER — ATORVASTATIN CALCIUM 80 MG PO TABS: 80.0000 mg | ORAL_TABLET | Freq: Every day | ORAL | 2 refills | Status: DC

## 2021-03-22 NOTE — Progress Notes (Signed)
Subjective:  Patient ID: Shannon Erickson, female    DOB: [birth year]  Age: 45 y.o. MRN: [id number]  Chief Complaint  Patient presents with   Hypertension   Seizures   Diabetes    HPI: chronic visit  Patient presents for follow up of hypertension.  Patient tolerating none well with side effects.  Patient was diagnosed with hypertension 2020 so has been treated for hypertension for 2 years.Patient is working on maintaining diet and exercise regimen and follows up as directed. Complication include none.   No further siezures last FEB 2018  Patient present with type 2 diabetes.  Specifically, this is type 2, noninuslin requiring diabetes, complicated bhyperteniosn and hypercholesterolemia.  Compliance with treatment has been good; patient take medicines as directed, maintains diet and exercise regimen, follows up as directed, and is keeping glucose diary.  Date of  diagnosis 2020.  Depression screen has been performed.Tobacco screen nonsmoker. Current medicines for diabetes semiglutide.  Patient is on non for renal protection and atorvastatin for cholesterol control.  Patient performs foot exams daily and last ophthalmologic exam was no.    Current Outpatient Medications on File Prior to Visit  Medication Sig Dispense Refill   nitroGLYCERIN (NITROSTAT) 0.4 MG SL tablet Place 1 tablet under the tongue every 5 (five) minutes as needed.  0   Semaglutide,0.25 or 0.5MG /DOS, (OZEMPIC, 0.25 OR 0.5 MG/DOSE,) 2 MG/1.5ML SOPN Inject 0.5 mg into the skin once a week. 6 mL 3   No current facility-administered medications on file prior to visit.   Past Medical History:  Diagnosis Date   Hypertension    Lesion of left ulnar nerve    Migraine    Seizures ([facility])    Stroke [facility])    Past Surgical History:  Procedure Laterality Date   CESAREAN SECTION Bilateral 1995, 2002   CHOLECYSTECTOMY     LYMPH NODE BIOPSY     NO PAST SURGERIES     TEE WITHOUT CARDIOVERSION N/A 12/05/2016   Procedure:  TRANSESOPHAGEAL ECHOCARDIOGRAM (TEE);  Surgeon: Elease Hashimoto, Deloris Ping, MD;  Location: [facility] ENDOSCOPY;  Service: Cardiovascular;  Laterality: N/A;    Family History  Problem Relation Age of Onset   Lung cancer Mother        mets to brain, bone, liver   Hypertension Father    Heart attack Brother        at age 69   Renal cancer Maternal Grandfather    Heart attack Paternal Grandfather    Social History   Socioeconomic History   Marital status: Single    Spouse name: Not on file   Number of children: 3   Years of education: 12   Highest education level: Not on file  Occupational History   Not on file  Tobacco Use   Smoking status: Never   Smokeless tobacco: Never  Vaping Use   Vaping Use: Never used  Substance and Sexual Activity   Alcohol use: No   Drug use: No   Sexual activity: Not on file  Other Topics Concern   Not on file  Social History Narrative   Not on file   Social Determinants of Health   Financial Resource Strain: Not on file  Food Insecurity: No Food Insecurity   Worried About Running Out of Food in the Last Year: Never true   Ran Out of Food in the Last Year: Never true  Transportation Needs: Not on file  Physical Activity: Not on file  Stress: Not on file  Social Connections:  Not on file    Review of Systems  Constitutional:  Negative for chills, fatigue and fever.  HENT:  Negative for congestion, ear pain and sore throat.   Respiratory:  Negative for cough and shortness of breath.   Cardiovascular:  Negative for chest pain and palpitations.  Gastrointestinal:  Negative for abdominal pain, constipation, diarrhea, nausea and vomiting.  Endocrine: Negative for polydipsia, polyphagia and polyuria.  Genitourinary:  Negative for difficulty urinating and dysuria.  Musculoskeletal:  Negative for arthralgias, back pain and myalgias.  Skin:  Negative for rash.  Neurological:  Negative for headaches.  Psychiatric/Behavioral:  Negative for dysphoric mood. The  patient is not nervous/anxious.     Objective:  BP 90/60   Pulse (!) 101   Temp 98.1 F (36.7 C)   Resp 16   Ht 5\' 4"  (1.626 m)   Wt 198 lb (89.8 kg)   SpO2 97%   BMI 33.99 kg/m   BP/Weight 03/22/2021 02/02/2021 11/17/2020  Systolic BP 90 - 11/19/2020  Diastolic BP 60 - 62  Wt. (Lbs) 198 207 208  BMI 33.99 35.53 36.85    Physical Exam Vitals reviewed.  Constitutional:      General: She is not in acute distress.    Appearance: Normal appearance. She is obese.  HENT:     Right Ear: Tympanic membrane, ear canal and external ear normal.     Left Ear: Tympanic membrane, ear canal and external ear normal.     Mouth/Throat:     Mouth: Mucous membranes are moist.     Pharynx: Oropharynx is clear.  Eyes:     Extraocular Movements: Extraocular movements intact.     Conjunctiva/sclera: Conjunctivae normal.     Pupils: Pupils are equal, round, and reactive to light.  Cardiovascular:     Rate and Rhythm: Normal rate and regular rhythm.     Pulses: Normal pulses.     Heart sounds: No murmur heard.   No gallop.  Pulmonary:     Effort: Pulmonary effort is normal. No respiratory distress.     Breath sounds: No wheezing.  Abdominal:     General: Bowel sounds are normal. There is no distension.     Palpations: Abdomen is soft.     Tenderness: There is no abdominal tenderness.  Musculoskeletal:     Cervical back: Normal range of motion.     Comments: Left arm weak  Skin:    General: Skin is warm.     Capillary Refill: Capillary refill takes less than 2 seconds.  Neurological:     General: No focal deficit present.     Mental Status: She is alert and oriented to person, place, and time. Mental status is at baseline.     Motor: Weakness (lft arm) present.  Psychiatric:        Mood and Affect: Mood normal.        Thought Content: Thought content normal.        Judgment: Judgment normal.    Diabetic Foot Exam - Simple   Simple Foot Form Diabetic Foot exam was performed with the  following findings: Yes 03/22/2021 10:40 AM  Visual Inspection No deformities, no ulcerations, no other skin breakdown bilaterally: Yes Sensation Testing Intact to touch and monofilament testing bilaterally: Yes Pulse Check Posterior Tibialis and Dorsalis pulse intact bilaterally: Yes Comments      Lab Results  Component Value Date   WBC 6.7 11/17/2020   HGB 13.3 11/17/2020   HCT 43.4 11/17/2020  PLT 281 11/17/2020   GLUCOSE 109 (H) 11/17/2020   CHOL 145 11/17/2020   TRIG 152 (H) 11/17/2020   HDL 46 11/17/2020   LDLCALC 73 11/17/2020   ALT 109 (H) 11/17/2020   AST 82 (H) 11/17/2020   NA 139 11/17/2020   K 4.2 11/17/2020   CL 102 11/17/2020   CREATININE 0.79 11/17/2020   BUN 11 11/17/2020   CO2 20 11/17/2020   TSH 1.119 12/03/2016   INR 1.9 (H) 02/26/2021   HGBA1C 7.1 (H) 11/17/2020   MICROALBUR 30 03/22/2021      Assessment & Plan:   Problem List Items Addressed This Visit       Cardiovascular and Mediastinum   Hypertension   Relevant Medications   warfarin (COUMADIN) 5 MG tablet   atorvastatin (LIPITOR) 80 MG tablet   Other Relevant Orders   Comprehensive metabolic panel   CBC with Differential/Platelet An individual hypertension care plan was established and reinforced today.  The patient's status was assessed using clinical findings on exam and labs or diagnostic tests. The patient's success at meeting treatment goals on disease specific evidence-based guidelines and found to be well controlled. SELF MANAGEMENT: The patient and I together assessed ways to personally work towards obtaining the recommended goals. RECOMMENDATIONS: avoid decongestants found in common cold remedies, decrease consumption of alcohol, perform routine monitoring of BP with home BP cuff, exercise, reduction of dietary salt, take medicines as prescribed, try not to miss doses and quit smoking.  Regular exercise and maintaining a healthy weight is needed.  Stress reduction may help. A  CLINICAL SUMMARY including written plan identify barriers to care unique to individual due to social or financial issues.  We attempt to mutually creat solutions for individual and family understanding.     Obesity, diabetes, and hypertension syndrome (HCC) - Primary   Relevant Medications   warfarin (COUMADIN) 5 MG tablet   atorvastatin (LIPITOR) 80 MG tablet   Other Relevant Orders   Hemoglobin A1c   POCT UA - Microalbumin An individual care plan for diabetes was established and reinforced today.  The patient's status was assessed using clinical findings on exam, labs and diagnostic testing. Patient success at meeting goals based on disease specific evidence-based guidelines and found to be good controlled. Medications were assessed and patient's understanding of the medical issues , including barriers were assessed. Recommend adherence to a diabetic diet, a graduated exercise program, HgbA1c level is checked quarterly, and urine microalbumin performed yearly .  Annual mono-filament sensation testing performed. Lower blood pressure and control hyperlipidemia is important. Get annual eye exams and annual flu shots and smoking cessation discussed.  Self management goals were discussed.      Nervous and Auditory   Hemiparesis affecting left side as late effect of cerebrovascular accident (HCC)   Relevant Medications   gabapentin (NEURONTIN) 300 MG capsule Patient has continued left sided weakness from stroke using cane   Seizure disorder (HCC)   Relevant Medications   gabapentin (NEURONTIN) 300 MG capsule   levETIRAcetam (KEPPRA) 750 MG tablet No recent seizures since on medicines     Hematopoietic and Hemostatic   Antiphospholipid syndrome (HCC) Patient is on chronic anticoagulation to prevent blood clots     Other   Hyperlipidemia   Relevant Medications   warfarin (COUMADIN) 5 MG tablet   atorvastatin (LIPITOR) 80 MG tablet   Other Relevant Orders   Lipid panel AN INDIVIDUAL CARE  PLAN for hyperlipidemia/ cholesterol was established and reinforced today.  The patient's  status was assessed using clinical findings on exam, lab and other diagnostic tests. The patient's disease status was assessed based on evidence-based guidelines and found to be fair controlled. MEDICATIONS were reviewed. SELF MANAGEMENT GOALS have been discussed and patient's success at attaining the goal of low cholesterol was assessed. RECOMMENDATION given include regular exercise 3 days a week and low cholesterol/low fat diet. CLINICAL SUMMARY including written plan to identify barriers unique to the patient due to social or economic  reasons was discussed.   .  Meds ordered this encounter  Medications   warfarin (COUMADIN) 5 MG tablet    Sig: Take 1 tablet (5 mg total) by mouth daily.    Dispense:  30 tablet    Refill:  6   gabapentin (NEURONTIN) 300 MG capsule    Sig: Take 1 capsule (300 mg total) by mouth 2 (two) times daily.    Dispense:  60 capsule    Refill:  6   atorvastatin (LIPITOR) 80 MG tablet    Sig: Take 1 tablet (80 mg total) by mouth daily.    Dispense:  90 tablet    Refill:  2   levETIRAcetam (KEPPRA) 750 MG tablet    Sig: Take 1 tablet (750 mg total) by mouth 2 (two) times daily.    Dispense:  45 tablet    Refill:  6    Orders Placed This Encounter  Procedures   Comprehensive metabolic panel   Hemoglobin A1c   Lipid panel   CBC with Differential/Platelet   Protime-INR   POCT UA - Microalbumin     Follow-up: Return in about 4 months (around 07/20/2021) for fasting.  An After Visit Summary was printed and given to the patient.  Brent Bulla, MD [facility] [phone]

## 2021-03-23 LAB — COMPREHENSIVE METABOLIC PANEL
ALT: 116 IU/L — ABNORMAL HIGH (ref 0–32)
AST: 67 IU/L — ABNORMAL HIGH (ref 0–40)
Albumin/Globulin Ratio: 0.9 — ABNORMAL LOW (ref 1.2–2.2)
Albumin: 3.3 g/dL — ABNORMAL LOW (ref 3.8–4.8)
Alkaline Phosphatase: 180 IU/L — ABNORMAL HIGH (ref 44–121)
BUN/Creatinine Ratio: 12 (ref 9–23)
BUN: 9 mg/dL (ref 6–24)
Bilirubin Total: 0.7 mg/dL (ref 0.0–1.2)
CO2: 24 mmol/L (ref 20–29)
Calcium: 8.2 mg/dL — ABNORMAL LOW (ref 8.7–10.2)
Chloride: 104 mmol/L (ref 96–106)
Creatinine, Ser: 0.75 mg/dL (ref 0.57–1.00)
Globulin, Total: 3.7 g/dL (ref 1.5–4.5)
Glucose: 102 mg/dL — ABNORMAL HIGH (ref 70–99)
Potassium: 4.1 mmol/L (ref 3.5–5.2)
Sodium: 139 mmol/L (ref 134–144)
Total Protein: 7 g/dL (ref 6.0–8.5)
eGFR: 101 mL/min/{1.73_m2} (ref >59)

## 2021-03-23 LAB — CBC WITH DIFFERENTIAL/PLATELET
Basophils Absolute: 0.1 10*3/uL (ref 0.0–0.2)
Basos: 1 %
EOS (ABSOLUTE): 0.1 10*3/uL (ref 0.0–0.4)
Eos: 2 %
Hematocrit: 35.2 % (ref 34.0–46.6)
Hemoglobin: 10.8 g/dL — ABNORMAL LOW (ref 11.1–15.9)
Immature Grans (Abs): 0 10*3/uL (ref 0.0–0.1)
Immature Granulocytes: 0 %
Lymphocytes Absolute: 1.9 10*3/uL (ref 0.7–3.1)
Lymphs: 32 %
MCH: 23.1 pg — ABNORMAL LOW (ref 26.6–33.0)
MCHC: 30.7 g/dL — ABNORMAL LOW (ref 31.5–35.7)
MCV: 75 fL — ABNORMAL LOW (ref 79–97)
Monocytes Absolute: 0.4 10*3/uL (ref 0.1–0.9)
Monocytes: 7 %
Neutrophils Absolute: 3.4 10*3/uL (ref 1.4–7.0)
Neutrophils: 58 %
Platelets: 373 10*3/uL (ref 150–450)
RBC: 4.67 x10E6/uL (ref 3.77–5.28)
RDW: 14.4 % (ref 11.7–15.4)
WBC: 5.9 10*3/uL (ref 3.4–10.8)

## 2021-03-23 LAB — LIPID PANEL
Chol/HDL Ratio: 3.2 ratio (ref 0.0–4.4)
Cholesterol, Total: 123 mg/dL (ref 100–199)
HDL: 39 mg/dL — ABNORMAL LOW (ref >39)
LDL Chol Calc (NIH): 65 mg/dL (ref 0–99)
Triglycerides: 100 mg/dL (ref 0–149)
VLDL Cholesterol Cal: 19 mg/dL (ref 5–40)

## 2021-03-23 LAB — PROTIME-INR
INR: 1.5 — ABNORMAL HIGH (ref 0.9–1.2)
Prothrombin Time: 15.1 s — ABNORMAL HIGH (ref 9.1–12.0)

## 2021-03-23 LAB — HEMOGLOBIN A1C
Est. average glucose Bld gHb Est-mCnc: 140 mg/dL
Hgb A1c MFr Bld: 6.5 % — ABNORMAL HIGH (ref 4.8–5.6)

## 2021-03-23 LAB — CARDIOVASCULAR RISK ASSESSMENT

## 2021-03-23 NOTE — Progress Notes (Signed)
A1c 5.6, liver tests stable, rest of lab normal lp

## 2021-03-23 NOTE — Progress Notes (Signed)
INR 1.5 good, recheck one month lp

## 2021-03-24 ENCOUNTER — Other Ambulatory Visit: Payer: Self-pay

## 2021-03-24 ENCOUNTER — Other Ambulatory Visit: Payer: Self-pay | Admitting: Legal Medicine

## 2021-03-24 ENCOUNTER — Telehealth: Payer: Self-pay

## 2021-03-24 DIAGNOSIS — E669 Obesity, unspecified: Secondary | ICD-10-CM

## 2021-03-24 MED ORDER — RYBELSUS 7 MG PO TABS: 7.0000 mg | ORAL_TABLET | Freq: Every day | ORAL | 6 refills | Status: DC

## 2021-03-24 MED ORDER — RYBELSUS 3 MG PO TABS: 3.0000 mg | ORAL_TABLET | Freq: Every day | ORAL | 3 refills | Status: DC

## 2021-03-24 NOTE — Telephone Encounter (Signed)
Sent in

## 2021-03-24 NOTE — Telephone Encounter (Signed)
PT RETURNED CALL. PT WOULD JUST LIKE RYBELLSUS SENT TO [facility].   Terrill Mohr 03/24/21 3:34 PM

## 2021-03-24 NOTE — Telephone Encounter (Signed)
Patient called requesting PO medication she can take instead of the Ozempic.  She states that she has trouble giving herself the injection "one handed" due to her disability.

## 2021-03-24 NOTE — Telephone Encounter (Signed)
Start rebelsus samples and rx lp

## 2021-03-24 NOTE — Telephone Encounter (Signed)
Patient was informed. I sent prescription to the pharmacy. Patient does not want a sample.

## 2021-03-30 ENCOUNTER — Telehealth: Payer: Self-pay

## 2021-03-30 ENCOUNTER — Other Ambulatory Visit: Payer: Self-pay

## 2021-03-30 DIAGNOSIS — G40909 Epilepsy, unspecified, not intractable, without status epilepticus: Secondary | ICD-10-CM

## 2021-03-30 MED ORDER — LEVETIRACETAM 750 MG PO TABS: 750.0000 mg | ORAL_TABLET | Freq: Two times a day (BID) | ORAL | 6 refills | Status: DC

## 2021-03-30 NOTE — Telephone Encounter (Signed)
Patient called stated she only got 45 tablets for Keppra, she is suppose to take two tablets a day. She only has enough for 2 weeks.

## 2021-03-30 NOTE — Telephone Encounter (Signed)
New prescription was sent. Patient was informed.

## 2021-03-31 ENCOUNTER — Other Ambulatory Visit: Payer: Self-pay

## 2021-03-31 ENCOUNTER — Encounter: Payer: Self-pay | Admitting: Legal Medicine

## 2021-03-31 ENCOUNTER — Telehealth: Payer: Self-pay

## 2021-03-31 DIAGNOSIS — E1159 Type 2 diabetes mellitus with other circulatory complications: Secondary | ICD-10-CM

## 2021-03-31 NOTE — Chronic Care Management (AMB) (Signed)
    Chronic Care Management Pharmacy Assistant   Name: Shannon Erickson  MRN: [id number] DOB: 1976-03-24   Reason for Encounter: Disease State call for DM    Recent office visits:  03/24/21 Orders Only. Increased Rybelsus from 3 mg to 7 mg daily.   04/13/21 Orders Only D/C Ozempic 2mg /1.5 ml. Due to change in therapy.   03/22/21 03/24/21 MD. Seen for obesity, HTN syndrome. D/C Tamiflu due to completed course. Changed Keppra 750 mg from take 1 to 1.5 tablet twice daily to take 2 times daily.   02/02/21 Video Visit. 02/04/21 MD. Seen for Flu. Started Tamiflu 75 mg 2 times daily.   11/17/20 11/19/20 MD. Seen for routine visit. Changed Warfarin Sodium from 1 mg daily to 6 mg daily. Pt reported no taking Metformin 500 mg 2 times daily.   Recent consult visits:  None   Hospital visits:  None   Medications: Outpatient Encounter Medications as of 03/31/2021  Medication Sig   atorvastatin (LIPITOR) 80 MG tablet Take 1 tablet (80 mg total) by mouth daily.   gabapentin (NEURONTIN) 300 MG capsule Take 1 capsule (300 mg total) by mouth 2 (two) times daily.   levETIRAcetam (KEPPRA) 750 MG tablet Take 1 tablet (750 mg total) by mouth 2 (two) times daily.   nitroGLYCERIN (NITROSTAT) 0.4 MG SL tablet Place 1 tablet under the tongue every 5 (five) minutes as needed.   Semaglutide (RYBELSUS) 7 MG TABS Take 7 mg by mouth daily.   warfarin (COUMADIN) 5 MG tablet Take 1 tablet (5 mg total) by mouth daily.   No facility-administered encounter medications on file as of 03/31/2021.   Recent Relevant Labs: Lab Results  Component Value Date/Time   HGBA1C 6.5 (H) 03/22/2021 10:46 AM   HGBA1C 7.1 (H) 11/17/2020 02:47 PM   MICROALBUR 30 03/22/2021 04:23 PM    Kidney Function Lab Results  Component Value Date/Time   CREATININE 0.75 03/22/2021 10:46 AM   CREATININE 0.79 11/17/2020 02:47 PM   GFRNONAA 88 03/17/2020 10:36 AM   GFRAA 101 03/17/2020 10:36 AM     Current  antihyperglycemic regimen:  Rybelsus 7 mg daily. Taking 3 mg daily  Patient verbally confirms she is taking the above medications as directed. Yes  What recent interventions/DTPs have been made to improve glycemic control:  Pt stated nothing has changed  Have there been any recent hospitalizations or ED visits since last visit with [facility]? No  Patient denies hypoglycemic symptoms, including None  Patient denies hyperglycemic symptoms, including none  How often are you checking your blood sugar? Pt stated she does not check sugars at home and was not advised to. She stated she gets them checked in the doctors office  What are your blood sugars ranging?  She did not have any readings  On insulin? No   During the week, how often does your blood glucose drop below 70? Never, not that she knows of  Are you checking your feet daily/regularly? Yes  Adherence Review: Is the patient currently on a STATIN medication? Yes Is the patient currently on ACE/ARB medication? No Does the patient have >5 day gap between last estimated fill dates? No   Care Gaps: Last eye exam / Retinopathy Screening? Never done  Last Annual Wellness Visit? None noted  Last Diabetic Foot Exam? 03/22/21   Star Rating Drugs:  Medication:  Last Fill: Day Supply None noted    03/24/21, [facility] Clinical Pharmacist Assistant  [phone]

## 2021-04-21 ENCOUNTER — Other Ambulatory Visit: Payer: Medicare Other

## 2021-04-21 ENCOUNTER — Other Ambulatory Visit: Payer: Self-pay

## 2021-04-21 DIAGNOSIS — E1159 Type 2 diabetes mellitus with other circulatory complications: Secondary | ICD-10-CM | POA: Diagnosis not present

## 2021-04-21 DIAGNOSIS — E1169 Type 2 diabetes mellitus with other specified complication: Secondary | ICD-10-CM | POA: Diagnosis not present

## 2021-04-21 DIAGNOSIS — D6861 Antiphospholipid syndrome: Secondary | ICD-10-CM

## 2021-04-21 LAB — HEMOGLOBIN A1C
Est. average glucose Bld gHb Est-mCnc: 140 mg/dL
Hgb A1c MFr Bld: 6.5 % — ABNORMAL HIGH (ref 4.8–5.6)

## 2021-04-22 LAB — PROTIME-INR
INR: 2.3 — ABNORMAL HIGH (ref 0.9–1.2)
Prothrombin Time: 23 s — ABNORMAL HIGH (ref 9.1–12.0)

## 2021-04-22 NOTE — Progress Notes (Signed)
A1c 6.5, stable

## 2021-04-22 NOTE — Progress Notes (Signed)
INR 2.3 good , continue present medicines lp

## 2021-04-26 ENCOUNTER — Telehealth: Payer: Self-pay

## 2021-04-26 NOTE — Telephone Encounter (Signed)
Patient calling requesting letter from doctor for landlord.  Pt is needing railing placed for two steps, landlord requesting letter due to this. Requests letter state pt has medical condition that would prove the need for railing. Please advise.   Lorita Officer, CCMA 04/26/21 2:12 PM

## 2021-04-26 NOTE — Telephone Encounter (Signed)
Note for railing, she had stroke lp

## 2021-04-27 ENCOUNTER — Encounter: Payer: Self-pay | Admitting: Legal Medicine

## 2021-04-28 NOTE — Telephone Encounter (Signed)
I left note in the front desk. Patient was informed.

## 2021-05-18 ENCOUNTER — Other Ambulatory Visit: Payer: Self-pay | Admitting: Legal Medicine

## 2021-05-18 DIAGNOSIS — I2699 Other pulmonary embolism without acute cor pulmonale: Secondary | ICD-10-CM

## 2021-05-20 ENCOUNTER — Other Ambulatory Visit: Payer: Self-pay | Admitting: Legal Medicine

## 2021-05-20 ENCOUNTER — Other Ambulatory Visit: Payer: Self-pay

## 2021-05-20 DIAGNOSIS — I2699 Other pulmonary embolism without acute cor pulmonale: Secondary | ICD-10-CM

## 2021-05-20 MED ORDER — WARFARIN SODIUM 6 MG PO TABS: ORAL_TABLET | ORAL | 6 refills | Status: DC

## 2021-06-23 ENCOUNTER — Other Ambulatory Visit: Payer: Self-pay

## 2021-07-04 HISTORY — PX: MOUTH SURGERY: SHX715

## 2021-07-26 ENCOUNTER — Ambulatory Visit: Payer: Medicare Other | Admitting: Legal Medicine

## 2021-08-03 ENCOUNTER — Other Ambulatory Visit: Payer: Self-pay

## 2021-08-03 ENCOUNTER — Ambulatory Visit (INDEPENDENT_AMBULATORY_CARE_PROVIDER_SITE_OTHER): Payer: Medicare Other | Admitting: Legal Medicine

## 2021-08-03 ENCOUNTER — Encounter: Payer: Self-pay | Admitting: Legal Medicine

## 2021-08-03 VITALS — BP 90/54 | HR 96 | Temp 98.2°F | Resp 15 | Ht 64.0 in | Wt 184.0 lb

## 2021-08-03 DIAGNOSIS — D6861 Antiphospholipid syndrome: Secondary | ICD-10-CM

## 2021-08-03 DIAGNOSIS — E1169 Type 2 diabetes mellitus with other specified complication: Secondary | ICD-10-CM | POA: Diagnosis not present

## 2021-08-03 DIAGNOSIS — I152 Hypertension secondary to endocrine disorders: Secondary | ICD-10-CM | POA: Diagnosis not present

## 2021-08-03 DIAGNOSIS — G40909 Epilepsy, unspecified, not intractable, without status epilepticus: Secondary | ICD-10-CM | POA: Diagnosis not present

## 2021-08-03 DIAGNOSIS — I69354 Hemiplegia and hemiparesis following cerebral infarction affecting left non-dominant side: Secondary | ICD-10-CM | POA: Diagnosis not present

## 2021-08-03 DIAGNOSIS — E669 Obesity, unspecified: Secondary | ICD-10-CM

## 2021-08-03 DIAGNOSIS — E782 Mixed hyperlipidemia: Secondary | ICD-10-CM

## 2021-08-03 DIAGNOSIS — I10 Essential (primary) hypertension: Secondary | ICD-10-CM

## 2021-08-03 DIAGNOSIS — Z6831 Body mass index (BMI) 31.0-31.9, adult: Secondary | ICD-10-CM

## 2021-08-03 DIAGNOSIS — E1159 Type 2 diabetes mellitus with other circulatory complications: Secondary | ICD-10-CM | POA: Diagnosis not present

## 2021-08-03 NOTE — Progress Notes (Signed)
? ?Subjective:  ?Patient ID: Shannon Erickson, female    DOB: 01/14/1976  Age: 46 y.o. MRN: [id number] ? ?Chief Complaint  ?Patient presents with  ? Diabetes  ? Hypertension  ? ? ?HPI: chronic visit ? ?Diabetes: Patient is taking Rybelsus 7 mg daily. She does not check blood sugar at home. Patient will schedule an appointment to see Eye doctor. Last Eye Exam was few years ago.  Last A1C was 6.5%.does not check at home. ? ?Hyperlipidemia: She is taking Atorvastatin 80 mg daily. ?Patient presents with hyperlipidemia.  Compliance with treatment has been good; patient takes medicines as directed, maintains low cholesterol diet, follows up as directed, and maintains exercise regimen.  Patient is using atorvastatin without problems.  ? ?Seizure disorder: She takes Keppra 750 mg twice a day. No seizures for extended time ? ?Antiphospholipid syndrome: Currently taking coumadin 6 mg daiy. Stable ?Stroke: still weak in left upper extremity, left leg strength has improved ?  ?Current Outpatient Medications on File Prior to Visit  ?Medication Sig Dispense Refill  ? atorvastatin (LIPITOR) 80 MG tablet Take 1 tablet (80 mg total) by mouth daily. 90 tablet 2  ? gabapentin (NEURONTIN) 300 MG capsule Take 1 capsule (300 mg total) by mouth 2 (two) times daily. 60 capsule 6  ? levETIRAcetam (KEPPRA) 750 MG tablet Take 1 tablet (750 mg total) by mouth 2 (two) times daily. 60 tablet 6  ? nitroGLYCERIN (NITROSTAT) 0.4 MG SL tablet Place 1 tablet under the tongue every 5 (five) minutes as needed.  0  ? Semaglutide (RYBELSUS) 7 MG TABS Take 7 mg by mouth daily. 30 tablet 6  ? warfarin (COUMADIN) 6 MG tablet TAKE 1 TABLET BY MOUTH ONCE DAILY FOR 1 DOSE. TAKE WITH THE 1MG  DOSE DAILY AT 6 PM FOR 7MG  TOTAL 30 tablet 6  ? ?No current facility-administered medications on file prior to visit.  ? ?Past Medical History:  ?Diagnosis Date  ? Hypertension   ? Lesion of left ulnar nerve   ? Migraine   ? Seizures (HCC)   ? Stroke [facility](HCC)   ? ?Past  Surgical History:  ?Procedure Laterality Date  ? CESAREAN SECTION Bilateral 1995, 2002  ? CHOLECYSTECTOMY    ? LYMPH NODE BIOPSY    ? MOUTH SURGERY Left 07/04/2021  ? NO PAST SURGERIES    ? TEE WITHOUT CARDIOVERSION N/A 12/05/2016  ? Procedure: TRANSESOPHAGEAL ECHOCARDIOGRAM (TEE);  Surgeon: Elease HashimotoNahser, Deloris PingPhilip J, MD;  [facility];  Service: Cardiovascular;  Laterality: N/A;  ?  ?Family History  ?Problem Relation Age of Onset  ? Lung cancer Mother   ?     mets to brain, bone, liver  ? Hypertension Father   ? Heart attack Brother   ?     at age 46  ? Renal cancer Maternal Grandfather   ? Heart attack Paternal Grandfather   ? ?Social History  ? ?Socioeconomic History  ? Marital status: Single  ?  Spouse name: Not on file  ? Number of children: 3  ? Years of education: 3212  ? Highest education level: Not on file  ?Occupational History  ? Not on file  ?Tobacco Use  ? Smoking status: Never  ? Smokeless tobacco: Never  ?Vaping Use  ? Vaping Use: Never used  ?Substance and Sexual Activity  ? Alcohol use: No  ? Drug use: No  ? Sexual activity: Not Currently  ?  Birth control/protection: Other-see comments  ?Other Topics Concern  ? Not on file  ?Social History  Narrative  ? Not on file  ? ?Social Determinants of Health  ? ?Financial Resource Strain: Not on file  ?Food Insecurity: No Food Insecurity  ? Worried About Programme researcher, broadcasting/film/video in the Last Year: Never true  ? Ran Out of Food in the Last Year: Never true  ?Transportation Needs: Not on file  ?Physical Activity: Not on file  ?Stress: Not on file  ?Social Connections: Not on file  ? ? ?Review of Systems  ?Constitutional:  Negative for chills, fatigue and fever.  ?HENT:  Negative for congestion, ear pain and sore throat.   ?Eyes:  Negative for visual disturbance.  ?Respiratory:  Negative for cough and shortness of breath.   ?Cardiovascular:  Negative for chest pain and palpitations.  ?Gastrointestinal:  Negative for abdominal pain, constipation, diarrhea, nausea and  vomiting.  ?Endocrine: Negative for polydipsia, polyphagia and polyuria.  ?Genitourinary:  Negative for difficulty urinating and dysuria.  ?Musculoskeletal:  Negative for arthralgias, back pain and myalgias.  ?Skin:  Negative for rash.  ?Neurological:  Negative for headaches.  ?Psychiatric/Behavioral:  Negative for dysphoric mood. The patient is not nervous/anxious.   ? ? ?Objective:  ?BP (!) 90/54   Pulse 96   Temp 98.2 ?F (36.8 ?C)   Resp 15   Ht 5\' 4"  (1.626 m)   Wt 184 lb (83.5 kg)   SpO2 97%   BMI 31.58 kg/m?  ? ?BP/Weight 08/03/2021 03/22/2021 02/02/2021  ?Systolic BP 90 90 -  ?Diastolic BP 54 60 -  ?Wt. (Lbs) 184 198 207  ?BMI 31.58 33.99 35.53  ? ? ?Physical Exam ?Vitals reviewed.  ?Constitutional:   ?   General: She is not in acute distress. ?   Appearance: Normal appearance.  ?HENT:  ?   Head: Normocephalic.  ?   Right Ear: Tympanic membrane normal.  ?   Left Ear: Tympanic membrane normal.  ?   Nose: Nose normal.  ?   Mouth/Throat:  ?   Mouth: Mucous membranes are moist.  ?   Pharynx: Oropharynx is clear.  ?Eyes:  ?   Extraocular Movements: Extraocular movements intact.  ?   Conjunctiva/sclera: Conjunctivae normal.  ?   Pupils: Pupils are equal, round, and reactive to light.  ?Cardiovascular:  ?   Rate and Rhythm: Normal rate and regular rhythm.  ?   Pulses: Normal pulses.  ?   Heart sounds: Normal heart sounds. No murmur heard. ?  No gallop.  ?Pulmonary:  ?   Effort: Pulmonary effort is normal. No respiratory distress.  ?   Breath sounds: Normal breath sounds. No wheezing.  ?Abdominal:  ?   General: Abdomen is flat. Bowel sounds are normal. There is no distension.  ?   Tenderness: There is no abdominal tenderness.  ?Musculoskeletal:     ?   General: Normal range of motion.  ?   Cervical back: Normal range of motion.  ?   Right lower leg: No edema.  ?   Left lower leg: No edema.  ?   Comments: Faccid left arm.  ?Skin: ?   General: Skin is warm.  ?   Capillary Refill: Capillary refill takes less than 2  seconds.  ?Neurological:  ?   General: No focal deficit present.  ?   Mental Status: She is alert and oriented to person, place, and time.  ?   Gait: Gait normal.  ?   Deep Tendon Reflexes: Reflexes normal.  ?Psychiatric:     ?   Mood and Affect:  Mood normal.     ?   Thought Content: Thought content normal.  ? ? ?Diabetic Foot Exam - Simple   ?Simple Foot Form ?Diabetic Foot exam was performed with the following findings: Yes 08/03/2021 11:08 AM  ?Visual Inspection ?No deformities, no ulcerations, no other skin breakdown bilaterally: Yes ?Sensation Testing ?Intact to touch and monofilament testing bilaterally: Yes ?Pulse Check ?Posterior Tibialis and Dorsalis pulse intact bilaterally: Yes ?Comments ?  ?  ? ?Lab Results  ?Component Value Date  ? WBC 5.9 03/22/2021  ? HGB 10.8 (L) 03/22/2021  ? HCT 35.2 03/22/2021  ? PLT 373 03/22/2021  ? GLUCOSE 102 (H) 03/22/2021  ? CHOL 123 03/22/2021  ? TRIG 100 03/22/2021  ? HDL 39 (L) 03/22/2021  ? LDLCALC 65 03/22/2021  ? ALT 116 (H) 03/22/2021  ? AST 67 (H) 03/22/2021  ? NA 139 03/22/2021  ? K 4.1 03/22/2021  ? CL 104 03/22/2021  ? CREATININE 0.75 03/22/2021  ? BUN 9 03/22/2021  ? CO2 24 03/22/2021  ? TSH 1.119 12/03/2016  ? INR 2.3 (H) 04/21/2021  ? HGBA1C 6.5 (H) 04/21/2021  ? MICROALBUR 30 03/22/2021  ? ? ? ? ?Assessment & Plan:  ? ?Problem List Items Addressed This Visit   ? ?  ? Cardiovascular and Mediastinum  ? Primary hypertension  ? Relevant Orders  ? Comprehensive metabolic panel  ? CBC with Differential/Platelet ?An individual hypertension care plan was established and reinforced today.  The patient's status was assessed using clinical findings on exam and labs or diagnostic tests. The patient's success at meeting treatment goals on disease specific evidence-based guidelines and found to be well controlled. ?SELF MANAGEMENT: The patient and I together assessed ways to personally work towards obtaining the recommended goals. ?RECOMMENDATIONS: avoid decongestants found  in common cold remedies, decrease consumption of alcohol, perform routine monitoring of BP with home BP cuff, exercise, reduction of dietary salt, take medicines as prescribed, try not to miss doses and quit smoking

## 2021-08-04 LAB — CBC WITH DIFFERENTIAL/PLATELET
Basophils Absolute: 0.1 10*3/uL (ref 0.0–0.2)
Basos: 1 %
EOS (ABSOLUTE): 0.1 10*3/uL (ref 0.0–0.4)
Eos: 1 %
Hematocrit: 36.1 % (ref 34.0–46.6)
Hemoglobin: 10.8 g/dL — ABNORMAL LOW (ref 11.1–15.9)
Immature Grans (Abs): 0 10*3/uL (ref 0.0–0.1)
Immature Granulocytes: 0 %
Lymphocytes Absolute: 2 10*3/uL (ref 0.7–3.1)
Lymphs: 30 %
MCH: 20.8 pg — ABNORMAL LOW (ref 26.6–33.0)
MCHC: 29.9 g/dL — ABNORMAL LOW (ref 31.5–35.7)
MCV: 70 fL — ABNORMAL LOW (ref 79–97)
Monocytes Absolute: 0.5 10*3/uL (ref 0.1–0.9)
Monocytes: 7 %
Neutrophils Absolute: 3.9 10*3/uL (ref 1.4–7.0)
Neutrophils: 61 %
Platelets: 349 10*3/uL (ref 150–450)
RBC: 5.19 x10E6/uL (ref 3.77–5.28)
RDW: 16.3 % — ABNORMAL HIGH (ref 11.7–15.4)
WBC: 6.6 10*3/uL (ref 3.4–10.8)

## 2021-08-04 LAB — MICROALBUMIN / CREATININE URINE RATIO
Creatinine, Urine: 187.5 mg/dL
Microalb/Creat Ratio: 3 mg/g creat (ref 0–29)
Microalbumin, Urine: 5.8 ug/mL

## 2021-08-04 LAB — HEMOGLOBIN A1C
Est. average glucose Bld gHb Est-mCnc: 143 mg/dL
Hgb A1c MFr Bld: 6.6 % — ABNORMAL HIGH (ref 4.8–5.6)

## 2021-08-04 LAB — COMPREHENSIVE METABOLIC PANEL
ALT: 56 IU/L — ABNORMAL HIGH (ref 0–32)
AST: 45 IU/L — ABNORMAL HIGH (ref 0–40)
Albumin/Globulin Ratio: 0.9 — ABNORMAL LOW (ref 1.2–2.2)
Albumin: 3.3 g/dL — ABNORMAL LOW (ref 3.8–4.8)
Alkaline Phosphatase: 187 IU/L — ABNORMAL HIGH (ref 44–121)
BUN/Creatinine Ratio: 12 (ref 9–23)
BUN: 9 mg/dL (ref 6–24)
Bilirubin Total: 0.5 mg/dL (ref 0.0–1.2)
CO2: 22 mmol/L (ref 20–29)
Calcium: 8.3 mg/dL — ABNORMAL LOW (ref 8.7–10.2)
Chloride: 105 mmol/L (ref 96–106)
Creatinine, Ser: 0.75 mg/dL (ref 0.57–1.00)
Globulin, Total: 3.7 g/dL (ref 1.5–4.5)
Glucose: 91 mg/dL (ref 70–99)
Potassium: 4.1 mmol/L (ref 3.5–5.2)
Sodium: 139 mmol/L (ref 134–144)
Total Protein: 7 g/dL (ref 6.0–8.5)
eGFR: 100 mL/min/{1.73_m2} (ref >59)

## 2021-08-04 LAB — LIPID PANEL
Chol/HDL Ratio: 2.8 ratio (ref 0.0–4.4)
Cholesterol, Total: 121 mg/dL (ref 100–199)
HDL: 44 mg/dL (ref >39)
LDL Chol Calc (NIH): 60 mg/dL (ref 0–99)
Triglycerides: 90 mg/dL (ref 0–149)
VLDL Cholesterol Cal: 17 mg/dL (ref 5–40)

## 2021-08-04 LAB — CARDIOVASCULAR RISK ASSESSMENT

## 2021-08-04 LAB — PROTIME-INR
INR: 2.7 — ABNORMAL HIGH (ref 0.9–1.2)
Prothrombin Time: 27.2 s — ABNORMAL HIGH (ref 9.1–12.0)

## 2021-08-05 ENCOUNTER — Encounter: Payer: Self-pay | Admitting: Legal Medicine

## 2021-08-05 ENCOUNTER — Telehealth: Payer: Self-pay

## 2021-08-05 ENCOUNTER — Other Ambulatory Visit: Payer: Self-pay

## 2021-08-05 NOTE — Telephone Encounter (Signed)
Patient called questioning why there was no change to blood thinner medication when PT/INR was 2.7. She feels in previous encounters there would have been a change.  ? ?Terrill Mohr 08/05/21 11:16 AM ? ?

## 2021-08-05 NOTE — Telephone Encounter (Signed)
Recheck 2 weeks

## 2021-08-05 NOTE — Progress Notes (Signed)
All abnormal labs are chronic, liver tests reflect keppra effect or fatty liver, can get liver ultrasound ?lp

## 2021-08-05 NOTE — Progress Notes (Signed)
INR 2.7 watch ?lp

## 2021-08-06 ENCOUNTER — Other Ambulatory Visit: Payer: Self-pay

## 2021-08-06 DIAGNOSIS — E538 Deficiency of other specified B group vitamins: Secondary | ICD-10-CM

## 2021-08-06 DIAGNOSIS — R748 Abnormal levels of other serum enzymes: Secondary | ICD-10-CM

## 2021-08-06 DIAGNOSIS — E559 Vitamin D deficiency, unspecified: Secondary | ICD-10-CM

## 2021-08-09 ENCOUNTER — Telehealth: Payer: Self-pay

## 2021-08-09 NOTE — Progress Notes (Signed)
Pt was reminded of upcoming appt with CPP. ? ?Roxana Hires, CMA ?Clinical Pharmacist Assistant  ?[phone]  ?

## 2021-08-10 ENCOUNTER — Telehealth: Payer: Self-pay | Admitting: Legal Medicine

## 2021-08-10 NOTE — Telephone Encounter (Signed)
? ?  Reeanna Acri has been scheduled for the following appointment: ? ?WHAT: GB/RUQ ULTRASOUND ?WHERE: [facility] ?DATE: 08/14/21 ?TIME: 7:30 AM ARRIVAL TIME ? ?Patient has been made aware. ?NPO AFTER MIDNIGHT, CHECK IN [place] THE ED ?

## 2021-08-11 ENCOUNTER — Ambulatory Visit (INDEPENDENT_AMBULATORY_CARE_PROVIDER_SITE_OTHER): Payer: Medicare Other

## 2021-08-11 NOTE — Progress Notes (Signed)
? ?Chronic Care Management ?Pharmacy Note ? ?08/11/2021 ?Name:  Shannon Erickson MRN:  [id number] DOB:  1975/11/12  ? ?Summary:  ?Pleasant 46 year old female presents for f/u CCM visit. Family is from the [place], [place]. Her daughter is in the service, just moved from [place] to [place]. [place] in [place]. She has 2 sons, one in [place] who is the oldest and an Industrial/product designer for [employer]. The other is in [place] and just graduated HS. 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 However, she had a stroke in 2018 which forced her to stop this pursuit. Her mother also got sick with lung cancer and passed away at that time. 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 ? ?Plan Updates:  ?N/A ? ?Subjective: ?Shannon Erickson is an 46 y.o. year old female who is a primary patient of Henrene Pastor, Zeb Comfort, MD.  The CCM team was consulted for assistance with disease management and care coordination needs.   ? ?Engaged with patient by telephone for follow up visit in response to provider referral for pharmacy case management and/or care coordination services.  ? ?Consent to Services:  ?The patient was given the following information about Chronic Care Management services today, agreed to services, and gave verbal consent: 1. CCM service includes personalized support from designated clinical staff supervised by the primary care provider, including individualized plan of care and coordination with other care providers 2. 24/7 contact phone numbers for assistance for urgent and routine care needs. 3. Service will only be billed when office clinical staff spend 20 minutes or more in a month to coordinate care. 4. Only one practitioner may furnish and bill the service in a calendar month. 5.The patient may stop CCM services at any time (effective at the end of the month) by phone call to the office staff. 6. The patient will be responsible for cost sharing  (co-pay) of up to 20% of the service fee (after annual deductible is met). Patient agreed to services and consent obtained. ? ?Patient Care Team: ?Lillard Anes, MD as PCP - General (Family Medicine) ?Lane Hacker, [facility] (Pharmacist) ? ? ?Recent office visits:  ?03/24/21 Orders Only. Increased Rybelsus from 3 mg to 7 mg daily.  ?  ?04/13/21 Orders Only D/C Ozempic 87m/1.5 ml. Due to change in therapy.  ?  ?03/22/21 PReinaldo MeekerMD. Seen for obesity, HTN syndrome. D/C Tamiflu due to completed course. Changed Keppra 750 mg from take 1 to 1.5 tablet twice daily to take 2 times daily.  ?  ?02/02/21 Video Visit. PReinaldo MeekerMD. Seen for Flu. Started Tamiflu 75 mg 2 times daily.  ?  ?11/17/20 PReinaldo MeekerMD. Seen for routine visit. Changed Warfarin Sodium from 1 mg daily to 6 mg daily. Pt reported no taking Metformin 500 mg 2 times daily.  ?  ?Recent consult visits:  ?None  ?  ?Hospital visits:  ?None  ?  ?  ? ?Objective: ? ?Lab Results  ?Component Value Date  ? CREATININE 0.75 08/03/2021  ? BUN 9 08/03/2021  ? GFRNONAA 88 03/17/2020  ? GFRAA 101 03/17/2020  ? NA 139 08/03/2021  ? K 4.1 08/03/2021  ? CALCIUM 8.3 (L) 08/03/2021  ? CO2 22 08/03/2021  ? GLUCOSE 91 08/03/2021  ? ? ?Lab Results  ?Component Value Date/Time  ? HGBA1C 6.6 (H) 08/03/2021 11:20 AM  ? HGBA1C 6.5 (H) 04/21/2021 10:49 AM  ?  MICROALBUR 30 03/22/2021 04:23 PM  ?  ?Last diabetic Eye exam: No results found for: HMDIABEYEEXA  ?Last diabetic Foot exam: No results found for: HMDIABFOOTEX  ? ?Lab Results  ?Component Value Date  ? CHOL 121 08/03/2021  ? HDL 44 08/03/2021  ? Johnston 60 08/03/2021  ? TRIG 90 08/03/2021  ? CHOLHDL 2.8 08/03/2021  ? ? ? ?  Latest Ref Rng & Units 08/03/2021  ? 11:20 AM 03/22/2021  ? 10:46 AM 11/17/2020  ?  2:47 PM  ?Hepatic Function  ?Total Protein 6.0 - 8.5 g/dL 7.0   7.0   6.9    ?Albumin 3.8 - 4.8 g/dL 3.3   3.3   3.4    ?AST 0 - 40 IU/L 45   67   82    ?ALT 0 - 32 IU/L 56   116   109    ?Alk Phosphatase 44 -  121 IU/L 187   180   173    ?Total Bilirubin 0.0 - 1.2 mg/dL 0.5   0.7   0.6    ? ? ?Lab Results  ?Component Value Date/Time  ? TSH 1.119 12/03/2016 07:05 AM  ? ? ? ?  Latest Ref Rng & Units 08/03/2021  ? 11:20 AM 03/22/2021  ? 10:46 AM 11/17/2020  ?  2:47 PM  ?CBC  ?WBC 3.4 - 10.8 x10E3/uL 6.6   5.9   6.7    ?Hemoglobin 11.1 - 15.9 g/dL 10.8   10.8   13.3    ?Hematocrit 34.0 - 46.6 % 36.1   35.2   43.4    ?Platelets 150 - 450 x10E3/uL 349   373   281    ? ? ?No results found for: VD25OH ? ?Clinical ASCVD: Yes  ?The ASCVD Risk score (Arnett DK, et al., 2019) failed to calculate for the following reasons: ?  The patient has a prior MI or stroke diagnosis   ? ? ?  02/02/2021  ?  1:12 PM 12/11/2019  ? 11:21 AM  ?Depression screen PHQ 2/9  ?Decreased Interest 0 0  ?Down, Depressed, Hopeless 0 0  ?PHQ - 2 Score 0 0  ?  ? ?Social History  ? ?Tobacco Use  ?Smoking Status Never  ?Smokeless Tobacco Never  ? ?BP Readings from Last 3 Encounters:  ?08/03/21 (!) 90/54  ?03/22/21 90/60  ?11/17/20 120/62  ? ?Pulse Readings from Last 3 Encounters:  ?08/03/21 96  ?03/22/21 (!) 101  ?11/17/20 (!) 101  ? ?Wt Readings from Last 3 Encounters:  ?08/03/21 184 lb (83.5 kg)  ?03/22/21 198 lb (89.8 kg)  ?02/02/21 207 lb (93.9 kg)  ? ?BMI Readings from Last 3 Encounters:  ?08/03/21 31.58 kg/m?  ?03/22/21 33.99 kg/m?  ?02/02/21 35.53 kg/m?  ? ? ?Assessment/Interventions: Review of patient past medical history, allergies, medications, health status, including review of consultants reports, laboratory and other test data, was performed as part of comprehensive evaluation and provision of chronic care management services.  ? ?SDOH:  (Social Determinants of Health) assessments and interventions performed: Yes ?SDOH Interventions   ? ?Flowsheet Row Most Recent Value  ?SDOH Interventions   ?Financial Strain Interventions Intervention Not Indicated  ?Transportation Interventions Intervention Not Indicated  ? ?  ? ?SDOH Screenings  ? ?Alcohol Screen: Not  on file  ?Depression (PHQ2-9): Low Risk   ? PHQ-2 Score: 0  ?Financial Resource Strain: Low Risk   ? Difficulty of Paying Living Expenses: Not hard at all  ?Food Insecurity: No Food Insecurity  ?  Worried About Charity fundraiser in the Last Year: Never true  ? Ran Out of Food in the Last Year: Never true  ?Housing: Low Risk   ? Last Housing Risk Score: 0  ?Physical Activity: Not on file  ?Social Connections: Not on file  ?Stress: Not on file  ?Tobacco Use: Low Risk   ? Smoking Tobacco Use: Never  ? Smokeless Tobacco Use: Never  ? Passive Exposure: Not on file  ?Transportation Needs: No Transportation Needs  ? Lack of Transportation (Medical): No  ? Lack of Transportation (Non-Medical): No  ? ? ?CCM Care Plan ? ?Allergies  ?Allergen Reactions  ? Penicillins Hives  ?  Has patient had a PCN reaction causing immediate rash, facial/tongue/throat swelling, SOB or lightheadedness with hypotension: Yes ?Has patient had a PCN reaction causing severe rash involving mucus membranes or skin necrosis: Yes ?Has patient had a PCN reaction that required hospitalization: Unk ?Has patient had a PCN reaction occurring within the last 10 years: No ?If all of the above answers are "NO", then may proceed with Cephalosporin use. ?  ? ? ?Medications Reviewed Today   ? ? Reviewed by Lane Hacker, [facility] (Pharmacist) on 08/11/21 at 1142  Med List Status: <None>  ? ?Medication Order Taking? Sig Documenting Provider Last Dose Status Informant  ?atorvastatin (LIPITOR) 80 MG tablet [id number] Yes Take 1 tablet (80 mg total) by mouth daily. Lillard Anes, MD Taking Active   ?gabapentin (NEURONTIN) 300 MG capsule [id number] Yes Take 1 capsule (300 mg total) by mouth 2 (two) times daily. Lillard Anes, MD Taking Active   ?levETIRAcetam (KEPPRA) 750 MG tablet [id number] Yes Take 1 tablet (750 mg total) by mouth 2 (two) times daily. Lillard Anes, MD Taking Active   ?nitroGLYCERIN (NITROSTAT) 0.4 MG SL tablet [id number]   Place 1 tablet under the tongue every 5 (five) minutes as needed. [provider]  Active   ?Semaglutide (RYBELSUS) 7 MG TABS [id number] Yes Take 7 mg by mouth daily. Lillard Anes, MD T

## 2021-08-11 NOTE — Patient Instructions (Signed)
Visit Information ? ? Goals Addressed   ?None ?  ? ?Patient Care Plan: CCM Pharmacy Care Plan  ?  ? ?Problem Identified: dm, htn, hld   ?Priority: High  ?Onset Date: 09/09/2020  ?  ? ?Long-Range Goal: Disease State Management   ?Start Date: 09/09/2020  ?Expected End Date: 09/09/2021  ?Recent Progress: On track  ?Priority: High  ?Note:   ? ?Current Barriers:  ?Unable to maintain control of diabetes ? ?Pharmacist Clinical Goal(s):  ?Patient will maintain control of diabetes as evidenced by blood sugar and a1c  through collaboration with PharmD and provider.  ? ?Interventions: ?1:1 collaboration with Shannon Miyamoto, MD regarding development and update of comprehensive plan of care as evidenced by provider attestation and co-signature ?Inter-disciplinary care team collaboration (see longitudinal plan of care) ?Comprehensive medication review performed; medication list updated in electronic medical record ? ?Hypertension (BP goal <130/80) ?-Controlled ?-Current treatment: ?Diet and exercise ?-Medications previously tried: none reported  ?-Current home readings: not checking  ?-Current dietary habits: trying to eat diet to control blood sugar readings and a1c ?-Current exercise habits:  walks and tries to do PT from after her stroke  ?-Denies hypotensive/hypertensive symptoms ?-Educated on BP goals and benefits of medications for prevention of heart attack, stroke and kidney damage; ?Daily salt intake goal < 2300 mg; ?Exercise goal of 150 minutes per week; ?-Counseled to monitor BP at home as needed, document, and provide log at future appointments ?-Counseled on diet and exercise extensively ? ?Hyperlipidemia: (LDL goal < 70) ?-Not ideally controlled ?-Current treatment: ?Atorvastatin 80 mg daily Appropriate, Effective, Safe, Accessible ?Nitroglycerin 0.4 mg sl prn  ?-Medications previously tried: none reported  ?-Current dietary patterns: working on low sugar and lower carbohydrate diet ?-Current exercise habits:  walks and tries to do PT from after her stroke  ?-Educated on Cholesterol goals;  ?Benefits of statin for ASCVD risk reduction; ?Importance of limiting foods high in cholesterol; ?Exercise goal of 150 minutes per week; ?-Counseled on diet and exercise extensively ?Recommended to continue current medication ? ?Diabetes (A1c goal <7%) ?-Controlled ?-Current medications: ?Semaglutide 7mg  Oral Appropriate, Effective, Safe, Accessible ?-Medications previously tried:  metformin - patient reports not taking in the past or now ?-Current home glucose readings ?fasting glucose: not checking  ?post prandial glucose: not checking  ?-Denies hypoglycemic/hyperglycemic symptoms ?-Current meal patterns:  ?breakfast: apple cinnamon oatmeal ?lunch: vegetables - zucchini/squash, pinto beans ?dinner: chicken, vegetables, fruit ?snacks: not regularly snacking  ?drinks:  unsweet tea with sugar free sweeteners, water ?-Current exercise: walks and tries to do PT from after her stroke  ?-Educated on A1c and blood sugar goals; ?Complications of diabetes including kidney damage, retinal damage, and cardiovascular disease; ?Exercise goal of 150 minutes per week; ?Carbohydrate counting and/or plate method ?-Counseled to check feet daily and get yearly eye exams ?-Counseled on diet and exercise extensively ?Educated on healthy diet ? ?Antiphospholipid syndrome (Goal: reduce risk of clotting) ?-Controlled ?-Current treatment  ?Warfarin 6 mg daily Appropriate, Effective, Safe, Accessible ?-Medications previously tried: none reported   ?-Counseled on diet and exercise extensively ?Recommended to continue current medication ? ?Seizure disorder (Goal: prevent seizures) ?-Controlled ?-Current treatment  ?Gabapentin 300 mg bid Appropriate, Effective, Safe, Accessible ?Keppra 500 mg 1.5 tablets twice daily Appropriate, Effective, Safe, Accessible ?-Medications previously tried: none reported  ?-Recommended to continue current medication ? ? ? ?Patient  Goals/Self-Care Activities ?Patient will:  ?- take medications as prescribed ?focus on medication adherence by using pill box  ?target a minimum of 150 minutes  of moderate intensity exercise weekly ?engage in dietary modifications by limiting carbohydrates ? ?Follow Up Plan: Telephone follow up appointment with care management team member scheduled for: PRN ? ?Shannon Erickson, Pharm.D. - [phone] ? ?  ? ? ?Shannon Erickson was given information about Chronic Care Management services today including:  ?CCM service includes personalized support from designated clinical staff supervised by her physician, including individualized plan of care and coordination with other care providers ?24/7 contact phone numbers for assistance for urgent and routine care needs. ?Standard insurance, coinsurance, copays and deductibles apply for chronic care management only during months in which we provide at least 20 minutes of these services. Most insurances cover these services at 100%, however patients may be responsible for any copay, coinsurance and/or deductible if applicable. This service may help you avoid the need for more expensive face-to-face services. ?Only one practitioner may furnish and bill the service in a calendar month. ?The patient may stop CCM services at any time (effective at the end of the month) by phone call to the office staff. ? ?Patient agreed to services and verbal consent obtained.  ? ?The patient verbalized understanding of instructions, educational materials, and care plan provided today and declined offer to receive copy of patient instructions, educational materials, and care plan.  ?-Patient low risk, will reach out PRN ? ?Shannon Erickson, [facility] [phone] ? ?

## 2021-08-14 DIAGNOSIS — R748 Abnormal levels of other serum enzymes: Secondary | ICD-10-CM | POA: Diagnosis not present

## 2021-08-14 DIAGNOSIS — K760 Fatty (change of) liver, not elsewhere classified: Secondary | ICD-10-CM | POA: Diagnosis not present

## 2021-08-14 DIAGNOSIS — Z9049 Acquired absence of other specified parts of digestive tract: Secondary | ICD-10-CM | POA: Diagnosis not present

## 2021-08-16 ENCOUNTER — Encounter: Payer: Self-pay | Admitting: Legal Medicine

## 2021-08-16 ENCOUNTER — Other Ambulatory Visit: Payer: Self-pay

## 2021-08-16 DIAGNOSIS — K7581 Nonalcoholic steatohepatitis (NASH): Secondary | ICD-10-CM | POA: Insufficient documentation

## 2021-08-16 DIAGNOSIS — R748 Abnormal levels of other serum enzymes: Secondary | ICD-10-CM

## 2021-08-20 DIAGNOSIS — E782 Mixed hyperlipidemia: Secondary | ICD-10-CM

## 2021-08-20 DIAGNOSIS — I10 Essential (primary) hypertension: Secondary | ICD-10-CM

## 2021-08-20 DIAGNOSIS — E119 Type 2 diabetes mellitus without complications: Secondary | ICD-10-CM | POA: Diagnosis not present

## 2021-11-04 ENCOUNTER — Other Ambulatory Visit: Payer: Medicare Other

## 2021-11-04 DIAGNOSIS — D6861 Antiphospholipid syndrome: Secondary | ICD-10-CM | POA: Diagnosis not present

## 2021-11-05 LAB — PROTIME-INR
INR: 2.5 — ABNORMAL HIGH (ref 0.9–1.2)
Prothrombin Time: 25.3 s — ABNORMAL HIGH (ref 9.1–12.0)

## 2021-11-05 NOTE — Progress Notes (Signed)
INR 2.5 good , continue dose lp

## 2021-11-20 ENCOUNTER — Other Ambulatory Visit: Payer: Self-pay | Admitting: Legal Medicine

## 2021-11-20 DIAGNOSIS — G40909 Epilepsy, unspecified, not intractable, without status epilepticus: Secondary | ICD-10-CM

## 2021-12-07 ENCOUNTER — Ambulatory Visit: Payer: Medicare Other | Admitting: Legal Medicine

## 2021-12-11 ENCOUNTER — Other Ambulatory Visit: Payer: Self-pay | Admitting: Legal Medicine

## 2021-12-11 DIAGNOSIS — E782 Mixed hyperlipidemia: Secondary | ICD-10-CM

## 2021-12-14 ENCOUNTER — Ambulatory Visit (INDEPENDENT_AMBULATORY_CARE_PROVIDER_SITE_OTHER): Payer: Medicare Other | Admitting: Legal Medicine

## 2021-12-14 ENCOUNTER — Encounter: Payer: Self-pay | Admitting: Legal Medicine

## 2021-12-14 VITALS — BP 112/64 | HR 89 | Temp 97.0°F | Ht 63.0 in | Wt 184.2 lb

## 2021-12-14 DIAGNOSIS — I10 Essential (primary) hypertension: Secondary | ICD-10-CM

## 2021-12-14 DIAGNOSIS — G40909 Epilepsy, unspecified, not intractable, without status epilepticus: Secondary | ICD-10-CM | POA: Diagnosis not present

## 2021-12-14 DIAGNOSIS — I152 Hypertension secondary to endocrine disorders: Secondary | ICD-10-CM

## 2021-12-14 DIAGNOSIS — E1169 Type 2 diabetes mellitus with other specified complication: Secondary | ICD-10-CM

## 2021-12-14 DIAGNOSIS — D6861 Antiphospholipid syndrome: Secondary | ICD-10-CM

## 2021-12-14 DIAGNOSIS — K7581 Nonalcoholic steatohepatitis (NASH): Secondary | ICD-10-CM | POA: Diagnosis not present

## 2021-12-14 DIAGNOSIS — Z1159 Encounter for screening for other viral diseases: Secondary | ICD-10-CM | POA: Diagnosis not present

## 2021-12-14 DIAGNOSIS — E1159 Type 2 diabetes mellitus with other circulatory complications: Secondary | ICD-10-CM

## 2021-12-14 DIAGNOSIS — I69354 Hemiplegia and hemiparesis following cerebral infarction affecting left non-dominant side: Secondary | ICD-10-CM | POA: Diagnosis not present

## 2021-12-14 DIAGNOSIS — E669 Obesity, unspecified: Secondary | ICD-10-CM

## 2021-12-14 DIAGNOSIS — I69319 Unspecified symptoms and signs involving cognitive functions following cerebral infarction: Secondary | ICD-10-CM

## 2021-12-14 DIAGNOSIS — E782 Mixed hyperlipidemia: Secondary | ICD-10-CM | POA: Diagnosis not present

## 2021-12-14 NOTE — Progress Notes (Signed)
Subjective:  Patient ID: Shannon Erickson, female    DOB: Dec 27, 1975  Age: 46 y.o. MRN: [id number]  Chief Complaint  Patient presents with   Diabetes   Hypertension   Hyperlipidemia    HPI" chronic  Patient presents with hyperlipidemia.  Compliance with treatment has been good; patient takes medicines as directed, maintains low cholesterol diet, follows up as directed, and maintains exercise regimen.  Patient is using  Atorvastatin 80 mg daily without problems.   Patient presents for follow up of hypertension. Patient was diagnosed with hypertension and has been treated for hypertension for 5 years.Patient is working on maintaining diet and exercise regimen and follows up as directed.   Patient present with type 2 diabetes.  Compliance with treatment has been good; patient take medicines as directed, maintains diet and exercise regimen, follows up as directed, and is keeping glucose diary. Current medicines for diabetes Rybelsus 7 mg  daily. Patient performs foot exams daily and last ophthalmologic exam was few years ago. Last A1C 6.6%.  Seizure disorder: She is taking Keppra  750 mg twice a day. No seizures  Still has weakness left side but can walk Current Outpatient Medications on File Prior to Visit  Medication Sig Dispense Refill   atorvastatin (LIPITOR) 80 MG tablet Take 1 tablet by mouth once daily 90 tablet 0   gabapentin (NEURONTIN) 300 MG capsule Take 1 capsule (300 mg total) by mouth 2 (two) times daily. 60 capsule 6   levETIRAcetam (KEPPRA) 750 MG tablet Take 1 tablet by mouth twice daily 60 tablet 2   nitroGLYCERIN (NITROSTAT) 0.4 MG SL tablet Place 1 tablet under the tongue every 5 (five) minutes as needed.  0   Semaglutide (RYBELSUS) 7 MG TABS Take 7 mg by mouth daily. 30 tablet 6   warfarin (COUMADIN) 6 MG tablet TAKE 1 TABLET BY MOUTH ONCE DAILY FOR 1 DOSE. TAKE WITH THE 1MG  DOSE DAILY AT 6 PM FOR 7MG  TOTAL 30 tablet 6   No current facility-administered medications on  file prior to visit.   Past Medical History:  Diagnosis Date   Hypertension    Lesion of left ulnar nerve    Migraine    Seizures ([place])    Stroke [facility])    Past Surgical History:  Procedure Laterality Date   CESAREAN SECTION Bilateral 1995, 2002   CHOLECYSTECTOMY     LYMPH NODE BIOPSY     MOUTH SURGERY Left 07/04/2021   NO PAST SURGERIES     TEE WITHOUT CARDIOVERSION N/A 12/05/2016   Procedure: TRANSESOPHAGEAL ECHOCARDIOGRAM (TEE);  Surgeon: Acie Fredrickson Wonda Cheng, MD;  Location[facility] ENDOSCOPY;  Service: Cardiovascular;  Laterality: N/A;    Family History  Problem Relation Age of Onset   Lung cancer Mother        mets to brain, bone, liver   Hypertension Father    Heart attack Brother        at age 105   Renal cancer Maternal Grandfather    Heart attack Paternal Grandfather    Social History   Socioeconomic History   Marital status: Single    Spouse name: Not on file   Number of children: 3   Years of education: 12   Highest education level: Not on file  Occupational History   Not on file  Tobacco Use   Smoking status: Never   Smokeless tobacco: Never  Vaping Use   Vaping Use: Never used  Substance and Sexual Activity   Alcohol use: No   Drug  use: No   Sexual activity: Not Currently    Birth control/protection: Other-see comments  Other Topics Concern   Not on file  Social History Narrative   Not on file   Social Determinants of Health   Financial Resource Strain: Low Risk  (08/11/2021)   Overall Financial Resource Strain (CARDIA)    Difficulty of Paying Living Expenses: Not hard at all  Food Insecurity: No Food Insecurity (09/09/2020)   Hunger Vital Sign    Worried About Running Out of Food in the Last Year: Never true    Ran Out of Food in the Last Year: Never true  Transportation Needs: No Transportation Needs (08/11/2021)   PRAPARE - Administrator, Civil Service (Medical): No    Lack of Transportation (Non-Medical): No  Physical Activity: Not on  file  Stress: Not on file  Social Connections: Not on file    Review of Systems  Constitutional:  Negative for chills, fatigue and fever.  HENT:  Negative for congestion, ear pain and sore throat.   Eyes:  Negative for visual disturbance.  Respiratory:  Negative for cough and shortness of breath.   Cardiovascular:  Negative for chest pain and palpitations.  Gastrointestinal:  Negative for abdominal pain, constipation, diarrhea, nausea and vomiting.  Endocrine: Negative for polydipsia, polyphagia and polyuria.  Genitourinary:  Negative for difficulty urinating and dysuria.  Musculoskeletal:  Negative for arthralgias, back pain and myalgias.  Skin:  Negative for rash.  Neurological:  Negative for headaches.  Psychiatric/Behavioral:  Negative for dysphoric mood. The patient is not nervous/anxious.      Objective:  BP 112/64   Pulse 89   Temp (!) 97 F (36.1 C)   Ht 5\' 3"  (1.6 m)   Wt 184 lb 3.2 oz (83.6 kg)   SpO2 99%   BMI 32.63 kg/m      12/14/2021    1:37 PM 08/03/2021   10:33 AM 03/22/2021   10:14 AM  BP/Weight  Systolic BP 112 90 90  Diastolic BP 64 54 60  Wt. (Lbs) 184.2 184 198  BMI 32.63 kg/m2 31.58 kg/m2 33.99 kg/m2    Physical Exam Vitals reviewed.  Constitutional:      General: She is not in acute distress.    Appearance: Normal appearance. She is obese.  HENT:     Head: Normocephalic and atraumatic.     Right Ear: Tympanic membrane normal.     Left Ear: Tympanic membrane normal.     Nose: Nose normal.     Mouth/Throat:     Mouth: Mucous membranes are moist.  Eyes:     Extraocular Movements: Extraocular movements intact.     Conjunctiva/sclera: Conjunctivae normal.     Pupils: Pupils are equal, round, and reactive to light.  Cardiovascular:     Rate and Rhythm: Normal rate and regular rhythm.     Pulses: Normal pulses.     Heart sounds: Normal heart sounds. No murmur heard.    No gallop.  Pulmonary:     Effort: Pulmonary effort is normal. No  respiratory distress.     Breath sounds: Normal breath sounds. No wheezing.  Abdominal:     General: Abdomen is flat. Bowel sounds are normal. There is no distension.     Palpations: Abdomen is soft.     Tenderness: There is no abdominal tenderness.  Musculoskeletal:        General: Normal range of motion.     Cervical back: Normal range of motion.  Right lower leg: No edema.     Left lower leg: No edema.  Skin:    General: Skin is warm.     Capillary Refill: Capillary refill takes less than 2 seconds.  Neurological:     General: No focal deficit present.     Mental Status: She is alert and oriented to person, place, and time. Mental status is at baseline.     Motor: Weakness present.     Gait: Gait abnormal.     Deep Tendon Reflexes: Reflexes abnormal.  Psychiatric:        Mood and Affect: Mood normal.        Thought Content: Thought content normal.     Diabetic Foot Exam - Simple   Simple Foot Form Diabetic Foot exam was performed with the following findings: Yes 12/14/2021  2:06 PM  Visual Inspection No deformities, no ulcerations, no other skin breakdown bilaterally: Yes Sensation Testing Intact to touch and monofilament testing bilaterally: Yes Pulse Check Posterior Tibialis and Dorsalis pulse intact bilaterally: Yes Comments      Lab Results  Component Value Date   WBC 6.6 08/03/2021   HGB 10.8 (L) 08/03/2021   HCT 36.1 08/03/2021   PLT 349 08/03/2021   GLUCOSE 91 08/03/2021   CHOL 121 08/03/2021   TRIG 90 08/03/2021   HDL 44 08/03/2021   LDLCALC 60 08/03/2021   ALT 56 (H) 08/03/2021   AST 45 (H) 08/03/2021   NA 139 08/03/2021   K 4.1 08/03/2021   CL 105 08/03/2021   CREATININE 0.75 08/03/2021   BUN 9 08/03/2021   CO2 22 08/03/2021   TSH 1.119 12/03/2016   INR 2.5 (H) 11/04/2021   HGBA1C 6.6 (H) 08/03/2021   MICROALBUR 30 03/22/2021      Assessment & Plan:   Problem List Items Addressed This Visit       Cardiovascular and Mediastinum    Cognitive deficit due to recent stroke Patient's cognitive status is stable and she is doing well    Primary hypertension - Primary   Relevant Orders   Comprehensive metabolic panel   CBC with Differential/Platelet An individual hypertension care plan was established and reinforced today.  The patient's status was assessed using clinical findings on exam and labs or diagnostic tests. The patient's success at meeting treatment goals on disease specific evidence-based guidelines and found to be well controlled. SELF MANAGEMENT: The patient and I together assessed ways to personally work towards obtaining the recommended goals. RECOMMENDATIONS: avoid decongestants found in common cold remedies, decrease consumption of alcohol, perform routine monitoring of BP with home BP cuff, exercise, reduction of dietary salt, take medicines as prescribed, try not to miss doses and quit smoking.  Regular exercise and maintaining a healthy weight is needed.  Stress reduction may help. A CLINICAL SUMMARY including written plan identify barriers to care unique to individual due to social or financial issues.  We attempt to mutually creat solutions for individual and family understanding.    Obesity, diabetes, and hypertension syndrome (HCC)   Relevant Orders   Hemoglobin A1c An individual care plan for diabetes was established and reinforced today.  The patient's status was assessed using clinical findings on exam, labs and diagnostic testing. Patient success at meeting goals based on disease specific evidence-based guidelines and found to be good controlled. Medications were assessed and patient's understanding of the medical issues , including barriers were assessed. Recommend adherence to a diabetic diet, a graduated exercise program, HgbA1c level is checked quarterly,  and urine microalbumin performed yearly .  Annual mono-filament sensation testing performed. Lower blood pressure and control hyperlipidemia is  important. Get annual eye exams and annual flu shots and smoking cessation discussed.  Self management goals were discussed.      Digestive   NASH (nonalcoholic steatohepatitis) No changes in NASH mild liver tests elevation      Nervous and Auditory   Hemiparesis affecting left side as late effect of cerebrovascular accident [facility]) Patient is stable after stroke and is ambulating well with assistance    Seizure disorder ([place]) Seizures are controlled with Keppra     Other   Mixed hyperlipidemia   Relevant Orders   Lipid panel   TSH AN INDIVIDUAL CARE PLAN for hyperlipidemia/ cholesterol was established and reinforced today.  The patient's status was assessed using clinical findings on exam, lab and other diagnostic tests. The patient's disease status was assessed based on evidence-based guidelines and found to be fair controlled. MEDICATIONS were reviewed. SELF MANAGEMENT GOALS have been discussed and patient's success at attaining the goal of low cholesterol was assessed. RECOMMENDATION given include regular exercise 3 days a week and low cholesterol/low fat diet. CLINICAL SUMMARY including written plan to identify barriers unique to the patient due to social or economic  reasons was discussed.    Other Visit Diagnoses     Need for hepatitis C screening test       Relevant Orders   Hepatitis C Antibody     .    Orders Placed This Encounter  Procedures   Comprehensive metabolic panel   Hemoglobin A1c   Lipid panel   CBC with Differential/Platelet   TSH   Hepatitis C Antibody     Follow-up: Return in about 3 months (around 03/16/2022).  An After Visit Summary was printed and given to the patient.  Reinaldo Meeker, MD [facility] [phone]

## 2021-12-15 ENCOUNTER — Other Ambulatory Visit: Payer: Self-pay

## 2021-12-15 DIAGNOSIS — R748 Abnormal levels of other serum enzymes: Secondary | ICD-10-CM

## 2021-12-15 LAB — LIPID PANEL
Chol/HDL Ratio: 2.7 ratio (ref 0.0–4.4)
Cholesterol, Total: 123 mg/dL (ref 100–199)
HDL: 45 mg/dL (ref >39)
LDL Chol Calc (NIH): 66 mg/dL (ref 0–99)
Triglycerides: 56 mg/dL (ref 0–149)
VLDL Cholesterol Cal: 12 mg/dL (ref 5–40)

## 2021-12-15 LAB — CBC WITH DIFFERENTIAL/PLATELET
Basophils Absolute: 0.1 10*3/uL (ref 0.0–0.2)
Basos: 1 %
EOS (ABSOLUTE): 0.1 10*3/uL (ref 0.0–0.4)
Eos: 1 %
Hematocrit: 34.9 % (ref 34.0–46.6)
Hemoglobin: 10.6 g/dL — ABNORMAL LOW (ref 11.1–15.9)
Immature Grans (Abs): 0 10*3/uL (ref 0.0–0.1)
Immature Granulocytes: 0 %
Lymphocytes Absolute: 1.9 10*3/uL (ref 0.7–3.1)
Lymphs: 31 %
MCH: 21.7 pg — ABNORMAL LOW (ref 26.6–33.0)
MCHC: 30.4 g/dL — ABNORMAL LOW (ref 31.5–35.7)
MCV: 71 fL — ABNORMAL LOW (ref 79–97)
Monocytes Absolute: 0.5 10*3/uL (ref 0.1–0.9)
Monocytes: 8 %
Neutrophils Absolute: 3.6 10*3/uL (ref 1.4–7.0)
Neutrophils: 59 %
Platelets: 318 10*3/uL (ref 150–450)
RBC: 4.89 x10E6/uL (ref 3.77–5.28)
RDW: 16.5 % — ABNORMAL HIGH (ref 11.7–15.4)
WBC: 6.1 10*3/uL (ref 3.4–10.8)

## 2021-12-15 LAB — PROTIME-INR
INR: 2.1 — ABNORMAL HIGH (ref 0.9–1.2)
Prothrombin Time: 21 s — ABNORMAL HIGH (ref 9.1–12.0)

## 2021-12-15 LAB — COMPREHENSIVE METABOLIC PANEL
ALT: 345 IU/L (ref 0–32)
AST: 251 IU/L (ref 0–40)
Albumin/Globulin Ratio: 0.8 — ABNORMAL LOW (ref 1.2–2.2)
Albumin: 3.2 g/dL — ABNORMAL LOW (ref 3.9–4.9)
Alkaline Phosphatase: 240 IU/L — ABNORMAL HIGH (ref 44–121)
BUN/Creatinine Ratio: 11 (ref 9–23)
BUN: 8 mg/dL (ref 6–24)
Bilirubin Total: 0.6 mg/dL (ref 0.0–1.2)
CO2: 21 mmol/L (ref 20–29)
Calcium: 8.3 mg/dL — ABNORMAL LOW (ref 8.7–10.2)
Chloride: 105 mmol/L (ref 96–106)
Creatinine, Ser: 0.75 mg/dL (ref 0.57–1.00)
Globulin, Total: 4.1 g/dL (ref 1.5–4.5)
Glucose: 86 mg/dL (ref 70–99)
Potassium: 4.2 mmol/L (ref 3.5–5.2)
Sodium: 139 mmol/L (ref 134–144)
Total Protein: 7.3 g/dL (ref 6.0–8.5)
eGFR: 100 mL/min/{1.73_m2} (ref >59)

## 2021-12-15 LAB — HEMOGLOBIN A1C
Est. average glucose Bld gHb Est-mCnc: 146 mg/dL
Hgb A1c MFr Bld: 6.7 % — ABNORMAL HIGH (ref 4.8–5.6)

## 2021-12-15 LAB — HEPATITIS C ANTIBODY: Hep C Virus Ab: NONREACTIVE

## 2021-12-15 LAB — CARDIOVASCULAR RISK ASSESSMENT

## 2021-12-15 LAB — TSH: TSH: 1.15 u[IU]/mL (ref 0.450–4.500)

## 2021-12-15 NOTE — Progress Notes (Signed)
Kidney tests normal, Liver tests now high, stop rebelsus may be rare reaction and recheck liver tests in 2 weeks, calcium remains elevated, INR 2.1 good, A1c 6.7 good, still mild nemia no changes chronic, hepatitis c normal lp

## 2021-12-18 ENCOUNTER — Encounter: Payer: Self-pay | Admitting: Legal Medicine

## 2021-12-20 ENCOUNTER — Telehealth: Payer: Self-pay | Admitting: Legal Medicine

## 2021-12-20 NOTE — Telephone Encounter (Signed)
Pt called wanting rs appt. I told her I will let [facility] nurse aware that she is wanting to rd appt.

## 2021-12-21 NOTE — Telephone Encounter (Signed)
Liver detox does not work- multiple medical studies- you can try it. Liver tests may be from medicines, especially seizure medicines, do not stop your medicines.  Sorry you feel so bad. lp

## 2021-12-21 NOTE — Telephone Encounter (Signed)
Liver detox usually does not help- per multiple medical studies.  The elevated Liver tests are probably due to Keppra and we need to continue this, no liver damage with these low levels. lp

## 2021-12-23 ENCOUNTER — Ambulatory Visit: Payer: Medicare Other | Admitting: Family Medicine

## 2021-12-23 NOTE — Telephone Encounter (Signed)
I called patient and let her know per Dr Lamar Sprinkles note. She verbalized to understand. She made an appointment on 12/28/2021 to recheck liver enzymes

## 2021-12-25 ENCOUNTER — Other Ambulatory Visit: Payer: Self-pay | Admitting: Legal Medicine

## 2021-12-25 DIAGNOSIS — G40909 Epilepsy, unspecified, not intractable, without status epilepticus: Secondary | ICD-10-CM

## 2021-12-25 DIAGNOSIS — I69354 Hemiplegia and hemiparesis following cerebral infarction affecting left non-dominant side: Secondary | ICD-10-CM

## 2021-12-28 ENCOUNTER — Other Ambulatory Visit: Payer: Medicare Other

## 2021-12-28 DIAGNOSIS — R748 Abnormal levels of other serum enzymes: Secondary | ICD-10-CM

## 2021-12-29 ENCOUNTER — Ambulatory Visit: Payer: Medicare Other | Admitting: Family

## 2021-12-29 ENCOUNTER — Encounter: Payer: Self-pay | Admitting: Family

## 2021-12-29 ENCOUNTER — Other Ambulatory Visit: Payer: Self-pay

## 2021-12-29 DIAGNOSIS — Z0000 Encounter for general adult medical examination without abnormal findings: Secondary | ICD-10-CM

## 2021-12-29 DIAGNOSIS — R748 Abnormal levels of other serum enzymes: Secondary | ICD-10-CM

## 2021-12-29 LAB — COMPREHENSIVE METABOLIC PANEL
ALT: 307 IU/L (ref 0–32)
AST: 217 IU/L (ref 0–40)
Albumin/Globulin Ratio: 0.8 — ABNORMAL LOW (ref 1.2–2.2)
Albumin: 3.3 g/dL — ABNORMAL LOW (ref 3.9–4.9)
Alkaline Phosphatase: 214 IU/L — ABNORMAL HIGH (ref 44–121)
BUN/Creatinine Ratio: 13 (ref 9–23)
BUN: 9 mg/dL (ref 6–24)
Bilirubin Total: 0.5 mg/dL (ref 0.0–1.2)
CO2: 19 mmol/L — ABNORMAL LOW (ref 20–29)
Calcium: 8.6 mg/dL — ABNORMAL LOW (ref 8.7–10.2)
Chloride: 109 mmol/L — ABNORMAL HIGH (ref 96–106)
Creatinine, Ser: 0.71 mg/dL (ref 0.57–1.00)
Globulin, Total: 3.9 g/dL (ref 1.5–4.5)
Glucose: 139 mg/dL — ABNORMAL HIGH (ref 70–99)
Potassium: 4.2 mmol/L (ref 3.5–5.2)
Sodium: 142 mmol/L (ref 134–144)
Total Protein: 7.2 g/dL (ref 6.0–8.5)
eGFR: 107 mL/min/{1.73_m2} (ref >59)

## 2021-12-29 NOTE — Progress Notes (Signed)
MEDICARE ANNUAL WELLNESS VISIT  12/29/2021  Telephone Visit Disclaimer This Medicare AWV was conducted by telephone due to national recommendations for restrictions regarding the COVID-19 Pandemic (e.g. social distancing).  I verified, using two identifiers, that I am speaking with Shannon Erickson or their authorized healthcare agent. I discussed the limitations, risks, security, and privacy concerns of performing an evaluation and management service by telephone and the potential availability of an in-person appointment in the future. The patient expressed understanding and agreed to proceed.  Location of Patient: Home Location of Provider (nurse):  Home  Subjective:    Shannon Erickson is a 46 y.o. female patient of Abigail Miyamoto, MD who had a Medicare Annual Wellness Visit today via telephone. Shannon Erickson is Legally disabled and lives alone she has 3 children. she reports that she is socially active and does interact with friends/family regularly. she is minimally physically active and enjoys reading.  Patient Care Team: Abigail Miyamoto, MD as PCP - General (Family Medicine) Zettie Pho, [facility] (Pharmacist)     12/29/2021   10:15 AM 06/24/2017    4:23 PM 01/31/2017   11:25 AM 01/31/2017   11:04 AM 01/06/2017    6:00 AM 12/03/2016    8:00 AM  Advanced Directives  Does Patient Have a Medical Advance Directive? No No No No No No  Would patient like information on creating a medical advance directive? No - Patient declined    No - Patient declined No - Patient declined    Hospital Utilization Over the Past 12 Months: # of hospitalizations or ER visits: 0 # of surgeries: 0  Review of Systems    Patient reports that her overall health is unchanged compared to last year.  History obtained from chart review and the patient  Patient Reported Readings (BP, Pulse, CBG, Weight, etc) none  Pain Assessment Pain : No/denies pain     Current Medications & Allergies  (verified) Allergies as of 12/29/2021       Reactions   Penicillins Hives   Has patient had a PCN reaction causing immediate rash, facial/tongue/throat swelling, SOB or lightheadedness with hypotension: Yes Has patient had a PCN reaction causing severe rash involving mucus membranes or skin necrosis: Yes Has patient had a PCN reaction that required hospitalization: Unk Has patient had a PCN reaction occurring within the last 10 years: No If all of the above answers are "NO", then may proceed with Cephalosporin use.        Medication List        Accurate as of December 29, 2021 10:23 AM. If you have any questions, ask your nurse or doctor.          atorvastatin 80 MG tablet Commonly known as: LIPITOR Take 1 tablet by mouth once daily   gabapentin 300 MG capsule Commonly known as: NEURONTIN Take 1 capsule by mouth twice daily   levETIRAcetam 750 MG tablet Commonly known as: KEPPRA Take 1 tablet by mouth twice daily   nitroGLYCERIN 0.4 MG SL tablet Commonly known as: NITROSTAT Place 1 tablet under the tongue every 5 (five) minutes as needed.   Rybelsus 7 MG Tabs Generic drug: Semaglutide Take 7 mg by mouth daily.   warfarin 6 MG tablet Commonly known as: COUMADIN TAKE 1 TABLET BY MOUTH ONCE DAILY FOR 1 DOSE. TAKE WITH THE 1MG  DOSE DAILY AT 6 PM FOR 7MG  TOTAL        History (reviewed): Past Medical History:  Diagnosis Date  Hypertension    Lesion of left ulnar nerve    Migraine    Seizures (HCC)    Stroke [facility])    Past Surgical History:  Procedure Laterality Date   CESAREAN SECTION Bilateral 1995, 2002   CHOLECYSTECTOMY     LYMPH NODE BIOPSY     MOUTH SURGERY Left 07/04/2021   NO PAST SURGERIES     TEE WITHOUT CARDIOVERSION N/A 12/05/2016   Procedure: TRANSESOPHAGEAL ECHOCARDIOGRAM (TEE);  Surgeon: Elease Hashimoto, Deloris Ping, MD;  Location: [facility] ENDOSCOPY;  Service: Cardiovascular;  Laterality: N/A;   Family History  Problem Relation Age of Onset   Lung cancer  Mother        mets to brain, bone, liver   Hypertension Father    Heart attack Brother        at age 64   Renal cancer Maternal Grandfather    Heart attack Paternal Grandfather    Social History   Socioeconomic History   Marital status: Single    Spouse name: Not on file   Number of children: 3   Years of education: 12   Highest education level: Not on file  Occupational History   Not on file  Tobacco Use   Smoking status: Never   Smokeless tobacco: Never  Vaping Use   Vaping Use: Never used  Substance and Sexual Activity   Alcohol use: No   Drug use: No   Sexual activity: Not Currently    Birth control/protection: Other-see comments  Other Topics Concern   Not on file  Social History Narrative   Not on file   Social Determinants of Health   Financial Resource Strain: Low Risk  (08/11/2021)   Overall Financial Resource Strain (CARDIA)    Difficulty of Paying Living Expenses: Not hard at all  Food Insecurity: No Food Insecurity (09/09/2020)   Hunger Vital Sign    Worried About Running Out of Food in the Last Year: Never true    Ran Out of Food in the Last Year: Never true  Transportation Needs: No Transportation Needs (08/11/2021)   PRAPARE - Administrator, Civil Service (Medical): No    Lack of Transportation (Non-Medical): No  Physical Activity: Not on file  Stress: Not on file  Social Connections: Not on file    Activities of Daily Living    12/29/2021   10:16 AM  In your present state of health, do you have any difficulty performing the following activities:  Hearing? 0  Vision? 0  Difficulty concentrating or making decisions? 0  Walking or climbing stairs? 1  Comment left sided weakness because of CVA  Dressing or bathing? 1  Doing errands, shopping? 0  Preparing Food and eating ? Y  Comment preping veggies, but buys precut veggies  Using the Toilet? N  In the past six months, have you accidently leaked urine? N  Do you have problems with  loss of bowel control? N  Managing your Medications? N  Managing your Finances? N  Housekeeping or managing your Housekeeping? N    Patient Education/ Literacy How often do you need to have someone help you when you read instructions, pamphlets, or other written materials from your doctor or pharmacy?: 1 - Never What is the last grade level you completed in school?: 12th, currenty in College  Exercise Current Exercise Habits: The patient does not participate in regular exercise at present, Exercise limited by: Other - see comments (Late effect CVA and has left weakness)  Diet Patient  reports consuming 2 meals a day and 1 snack(s) a day Patient reports that her primary diet is: Low Sodium, Diabetic Patient reports that she does have regular access to food.   Depression Screen    12/29/2021   10:15 AM 02/02/2021    1:12 PM 12/11/2019   11:21 AM  PHQ 2/9 Scores  PHQ - 2 Score 0 0 0     Fall Risk    12/29/2021   10:15 AM 12/11/2019   11:28 AM 07/03/2017    1:53 PM 02/22/2017   11:35 AM 02/20/2017   10:04 AM  Fall Risk   Falls in the past year? 0 0 No No No  Number falls in past yr:  0     Injury with Fall?  0     Risk for fall due to :  Impaired balance/gait;Impaired mobility     Follow up  Falls evaluation completed        Objective:  Shannon Erickson seemed alert and oriented and she participated appropriately during our telephone visit.  Blood Pressure Weight BMI  BP Readings from Last 3 Encounters:  12/14/21 112/64  08/03/21 (!) 90/54  03/22/21 90/60   Wt Readings from Last 3 Encounters:  12/14/21 184 lb 3.2 oz (83.6 kg)  08/03/21 184 lb (83.5 kg)  03/22/21 198 lb (89.8 kg)   BMI Readings from Last 1 Encounters:  12/14/21 32.63 kg/m    *Unable to obtain current vital signs, weight, and BMI due to telephone visit type  Hearing/Vision  Shannon Erickson did not seem to have difficulty with hearing/understanding during the telephone conversation Reports that she has not had  a formal eye exam by an eye care professional within the past year. Has one scheduled for September 2023. Reports that she has not had a formal hearing evaluation within the past year *Unable to fully assess hearing and vision during telephone visit type  Cognitive Function:    12/29/2021   10:19 AM  6CIT Screen  What Year? 0 points  What month? 0 points  What time? 0 points  Count back from 20 0 points  Months in reverse 0 points  Repeat phrase 0 points  Total Score 0 points   (Normal:0-7, Significant for Dysfunction: >8)  Normal Cognitive Function Screening: Yes   Immunization & Health Maintenance Record Immunization History  Administered Date(s) Administered   Moderna Sars-Covid-2 Vaccination 12/24/2019, 02/14/2020    Health Maintenance  Topic Date Due   OPHTHALMOLOGY EXAM  Never done   PAP SMEAR-Modifier  Never done   COLONOSCOPY (Pts 45-91yrs Insurance coverage will need to be confirmed)  Never done   INFLUENZA VACCINE  12/21/2021   TETANUS/TDAP  03/22/2022 (Originally 04/23/1995)   COVID-19 Vaccine (3 - Moderna series) 04/07/2022 (Originally 04/10/2020)   HEMOGLOBIN A1C  06/16/2022   URINE MICROALBUMIN  08/04/2022   FOOT EXAM  12/15/2022   Hepatitis C Screening  Completed   HIV Screening  Completed   HPV VACCINES  Aged Out       Assessment  This is a routine wellness examination for [employer].  Health Maintenance: Due or Overdue Health Maintenance Due  Topic Date Due   OPHTHALMOLOGY EXAM  Never done   PAP SMEAR-Modifier  Never done   COLONOSCOPY (Pts 45-54yrs Insurance coverage will need to be confirmed)  Never done   INFLUENZA VACCINE  12/21/2021    Shannon Erickson does not need a referral for Community Assistance: Care Management:   no Social Work:    no  Prescription Assistance:  no Nutrition/Diabetes Education:  no   Plan:  Personalized Goals  Goals Addressed   None    Personalized Health Maintenance & Screening Recommendations  Td  vaccine Screening Pap smear and pelvic exam  Colorectal cancer screening Diabetes screening Has eye scheduled in September. Will schedule colonoscopy and pap at a later date. Does not want referral at this time.   Lung Cancer Screening Recommended: no (Low Dose CT Chest recommended if Age 81-80 years, 30 pack-year currently smoking OR have quit w/in past 15 years) Hepatitis C Screening recommended: no HIV Screening recommended: no  Advanced Directives: Written information was not prepared per patient's request.  Referrals & Orders No orders of the defined types were placed in this encounter.   Follow-up Plan Follow-up with Abigail Miyamoto, MD as planned   I have personally reviewed and noted the following in the patient's chart:   Medical and social history Use of alcohol, tobacco or illicit drugs  Current medications and supplements Functional ability and status Nutritional status Physical activity Advanced directives List of other physicians Hospitalizations, surgeries, and ER visits in previous 12 months Vitals Screenings to include cognitive, depression, and falls Referrals and appointments  In addition, I have reviewed and discussed with Shannon Erickson certain preventive protocols, quality metrics, and best practice recommendations. A written personalized care plan for preventive services as well as general preventive health recommendations is available and can be mailed to the patient at her request.      Shannon Erickson  12/29/2021

## 2021-12-29 NOTE — Progress Notes (Signed)
Glucose 139, can take extra calcium, calcium 8.6 slightly low, liver tests up, get ultrasound liver, hold atorvastatin for one month and recheck liver tests, hold rebelsus one month lp

## 2021-12-29 NOTE — Patient Instructions (Signed)

## 2021-12-30 ENCOUNTER — Encounter: Payer: Self-pay | Admitting: Legal Medicine

## 2022-01-04 ENCOUNTER — Other Ambulatory Visit: Payer: Self-pay

## 2022-01-04 ENCOUNTER — Telehealth: Payer: Self-pay

## 2022-01-04 DIAGNOSIS — I69354 Hemiplegia and hemiparesis following cerebral infarction affecting left non-dominant side: Secondary | ICD-10-CM

## 2022-01-04 NOTE — Telephone Encounter (Signed)
Can you put addendum in her last note for physical therapy order? Thank you.

## 2022-01-07 ENCOUNTER — Telehealth: Payer: Medicare Other | Admitting: Family Medicine

## 2022-01-07 DIAGNOSIS — F419 Anxiety disorder, unspecified: Secondary | ICD-10-CM

## 2022-01-07 NOTE — Progress Notes (Signed)
Pt informed by video we will not be able to provide this service and to follow up with PCP. DWB

## 2022-01-11 ENCOUNTER — Encounter: Payer: Self-pay | Admitting: Legal Medicine

## 2022-01-26 ENCOUNTER — Ambulatory Visit (INDEPENDENT_AMBULATORY_CARE_PROVIDER_SITE_OTHER): Payer: Medicare Other | Admitting: Legal Medicine

## 2022-01-26 DIAGNOSIS — R748 Abnormal levels of other serum enzymes: Secondary | ICD-10-CM

## 2022-01-26 LAB — HM DIABETES EYE EXAM

## 2022-01-27 LAB — COMPREHENSIVE METABOLIC PANEL
ALT: 40 IU/L — ABNORMAL HIGH (ref 0–32)
AST: 26 IU/L (ref 0–40)
Albumin/Globulin Ratio: 0.9 — ABNORMAL LOW (ref 1.2–2.2)
Albumin: 3.1 g/dL — ABNORMAL LOW (ref 3.9–4.9)
Alkaline Phosphatase: 137 IU/L — ABNORMAL HIGH (ref 44–121)
BUN/Creatinine Ratio: 12 (ref 9–23)
BUN: 9 mg/dL (ref 6–24)
Bilirubin Total: 0.3 mg/dL (ref 0.0–1.2)
CO2: 18 mmol/L — ABNORMAL LOW (ref 20–29)
Calcium: 8.6 mg/dL — ABNORMAL LOW (ref 8.7–10.2)
Chloride: 104 mmol/L (ref 96–106)
Creatinine, Ser: 0.76 mg/dL (ref 0.57–1.00)
Globulin, Total: 3.5 g/dL (ref 1.5–4.5)
Glucose: 106 mg/dL — ABNORMAL HIGH (ref 70–99)
Potassium: 4.2 mmol/L (ref 3.5–5.2)
Sodium: 139 mmol/L (ref 134–144)
Total Protein: 6.6 g/dL (ref 6.0–8.5)
eGFR: 98 mL/min/{1.73_m2} (ref >59)

## 2022-01-27 NOTE — Progress Notes (Signed)
Glucose 106, kidney tests normal, calcium low, can take 1200 mg calcium a day, liver test improving, AST now normal, ALT just slightly elevated- stay off rebelsus,  lp

## 2022-02-01 ENCOUNTER — Encounter: Payer: Self-pay | Admitting: Legal Medicine

## 2022-02-01 DIAGNOSIS — R278 Other lack of coordination: Secondary | ICD-10-CM | POA: Diagnosis not present

## 2022-02-01 DIAGNOSIS — M6281 Muscle weakness (generalized): Secondary | ICD-10-CM | POA: Diagnosis not present

## 2022-02-01 DIAGNOSIS — R5383 Other fatigue: Secondary | ICD-10-CM | POA: Diagnosis not present

## 2022-02-01 DIAGNOSIS — R2681 Unsteadiness on feet: Secondary | ICD-10-CM | POA: Diagnosis not present

## 2022-02-01 DIAGNOSIS — I69354 Hemiplegia and hemiparesis following cerebral infarction affecting left non-dominant side: Secondary | ICD-10-CM | POA: Diagnosis not present

## 2022-02-01 DIAGNOSIS — M25672 Stiffness of left ankle, not elsewhere classified: Secondary | ICD-10-CM | POA: Diagnosis not present

## 2022-02-01 DIAGNOSIS — R208 Other disturbances of skin sensation: Secondary | ICD-10-CM | POA: Diagnosis not present

## 2022-02-02 ENCOUNTER — Telehealth (INDEPENDENT_AMBULATORY_CARE_PROVIDER_SITE_OTHER): Payer: Medicare Other | Admitting: Legal Medicine

## 2022-02-02 ENCOUNTER — Encounter: Payer: Self-pay | Admitting: Legal Medicine

## 2022-02-02 VITALS — Ht 63.0 in | Wt 184.0 lb

## 2022-02-02 DIAGNOSIS — F419 Anxiety disorder, unspecified: Secondary | ICD-10-CM | POA: Diagnosis not present

## 2022-02-02 DIAGNOSIS — E669 Obesity, unspecified: Secondary | ICD-10-CM

## 2022-02-02 DIAGNOSIS — E1159 Type 2 diabetes mellitus with other circulatory complications: Secondary | ICD-10-CM | POA: Diagnosis not present

## 2022-02-02 DIAGNOSIS — I152 Hypertension secondary to endocrine disorders: Secondary | ICD-10-CM | POA: Diagnosis not present

## 2022-02-02 DIAGNOSIS — E1169 Type 2 diabetes mellitus with other specified complication: Secondary | ICD-10-CM

## 2022-02-02 DIAGNOSIS — G8929 Other chronic pain: Secondary | ICD-10-CM

## 2022-02-02 DIAGNOSIS — M542 Cervicalgia: Secondary | ICD-10-CM | POA: Diagnosis not present

## 2022-02-02 DIAGNOSIS — R252 Cramp and spasm: Secondary | ICD-10-CM

## 2022-02-02 MED ORDER — DAPAGLIFLOZIN PROPANEDIOL 5 MG PO TABS: 5.0000 mg | ORAL_TABLET | Freq: Every day | ORAL | 3 refills | Status: DC

## 2022-02-02 MED ORDER — ESCITALOPRAM OXALATE 10 MG PO TABS: 10.0000 mg | ORAL_TABLET | Freq: Every day | ORAL | 3 refills | Status: DC

## 2022-02-02 NOTE — Progress Notes (Signed)
Virtual Visit via Video Note   This visit type was conducted due to national recommendations for restrictions regarding the COVID-19 Pandemic (e.g. social distancing) in an effort to limit this patient's exposure and mitigate transmission in our community.  Due to her co-morbid illnesses, this patient is at least at moderate risk for complications without adequate follow up.  This format is felt to be most appropriate for this patient at this time.  All issues noted in this document were discussed and addressed.  A limited physical exam was performed with this format.  A verbal consent was obtained for the virtual visit.   Date:  02/02/2022   ID:  Arlyce Dice, DOB Aug 30, 1975, MRN [id number]  Patient Location: Home Provider Location: Office/Clinic  PCP:  Abigail Miyamoto, MD   Evaluation Performed:  New Patient Evaluation  Chief Complaint:  Anxiety and Diabetes  History of Present Illness:    Reeya Bound is a 46 y.o. female with Diabetes. She was taking Rybelsus 7mg  daily, however she stopped due to liver enzymes elevated. We discussed this and will change her to  farxiga 5mg  qd.  She also has been anxious lately. She is lonely but not crying.  Poor sleep, try lexapro  The patient does not have symptoms concerning for COVID-19 infection (fever, chills, cough, or new shortness of breath). No   Past Medical History:  Diagnosis Date   Hypertension    Lesion of left ulnar nerve    Migraine    Seizures (HCC)    Stroke [facility])     Past Surgical History:  Procedure Laterality Date   CESAREAN SECTION Bilateral 1995, 2002   CHOLECYSTECTOMY     LYMPH NODE BIOPSY     MOUTH SURGERY Left 07/04/2021   NO PAST SURGERIES     TEE WITHOUT CARDIOVERSION N/A 12/05/2016   Procedure: TRANSESOPHAGEAL ECHOCARDIOGRAM (TEE);  Surgeon: 09/01/2021, 12/07/2016, MD;  Location: [facility] ENDOSCOPY;  Service: Cardiovascular;  Laterality: N/A;    Family History  Problem Relation Age of Onset    Lung cancer Mother        mets to brain, bone, liver   Hypertension Father    Heart attack Brother        at age 3   Renal cancer Maternal Grandfather    Heart attack Paternal Grandfather     Social History   Socioeconomic History   Marital status: Single    Spouse name: Not on file   Number of children: 3   Years of education: 12   Highest education level: Not on file  Occupational History   Not on file  Tobacco Use   Smoking status: Never   Smokeless tobacco: Never  Vaping Use   Vaping Use: Never used  Substance and Sexual Activity   Alcohol use: No   Drug use: No   Sexual activity: Not Currently    Birth control/protection: Other-see comments  Other Topics Concern   Not on file  Social History Narrative   Not on file   Social Determinants of Health   Financial Resource Strain: Low Risk  (08/11/2021)   Overall Financial Resource Strain (CARDIA)    Difficulty of Paying Living Expenses: Not hard at all  Food Insecurity: No Food Insecurity (09/09/2020)   Hunger Vital Sign    Worried About Running Out of Food in the Last Year: Never true    Ran Out of Food in the Last Year: Never true  Transportation Needs: No Transportation Needs (08/11/2021)  PRAPARE - Administrator, Civil Service (Medical): No    Lack of Transportation (Non-Medical): No  Physical Activity: Not on file  Stress: Not on file  Social Connections: Not on file  Intimate Partner Violence: Not on file    Outpatient Medications Prior to Visit  Medication Sig Dispense Refill   atorvastatin (LIPITOR) 80 MG tablet Take 1 tablet by mouth once daily 90 tablet 0   gabapentin (NEURONTIN) 300 MG capsule Take 1 capsule by mouth twice daily 60 capsule 2   levETIRAcetam (KEPPRA) 750 MG tablet Take 1 tablet by mouth twice daily 60 tablet 2   warfarin (COUMADIN) 6 MG tablet TAKE 1 TABLET BY MOUTH ONCE DAILY FOR 1 DOSE. TAKE WITH THE 1MG  DOSE DAILY AT 6 PM FOR 7MG  TOTAL (Patient taking differently: Take  6 mg by mouth daily.) 30 tablet 6   nitroGLYCERIN (NITROSTAT) 0.4 MG SL tablet Place 1 tablet under the tongue every 5 (five) minutes as needed. (Patient not taking: Reported on 12/29/2021)  0   Semaglutide (RYBELSUS) 7 MG TABS Take 7 mg by mouth daily. (Patient not taking: Reported on 12/29/2021) 30 tablet 6   No facility-administered medications prior to visit.    Allergies:   Penicillins   Social History   Tobacco Use   Smoking status: Never   Smokeless tobacco: Never  Vaping Use   Vaping Use: Never used  Substance Use Topics   Alcohol use: No   Drug use: No     Review of Systems  Constitutional:  Negative for chills, fever and malaise/fatigue.  HENT:  Negative for sore throat.   Respiratory:  Positive for hemoptysis. Negative for cough and shortness of breath.   Cardiovascular:  Negative for chest pain.  Gastrointestinal:  Negative for constipation, diarrhea and nausea.  Genitourinary:  Negative for frequency.  Musculoskeletal:  Positive for back pain and joint pain.  Neurological:  Negative for tremors and headaches.     Labs/Other Tests and Data Reviewed:    Recent Labs: 12/14/2021: Hemoglobin 10.6; Platelets 318; TSH 1.150 01/26/2022: ALT 40; BUN 9; Creatinine, Ser 0.76; Potassium 4.2; Sodium 139   Recent Lipid Panel Lab Results  Component Value Date/Time   CHOL 123 12/14/2021 02:24 PM   TRIG 56 12/14/2021 02:24 PM   HDL 45 12/14/2021 02:24 PM   CHOLHDL 2.7 12/14/2021 02:24 PM   CHOLHDL 4.7 12/04/2016 04:19 AM   LDLCALC 66 12/14/2021 02:24 PM    Wt Readings from Last 3 Encounters:  02/02/22 184 lb (83.5 kg)  12/14/21 184 lb 3.2 oz (83.6 kg)  08/03/21 184 lb (83.5 kg)     Objective:    Vital Signs:  Ht 5\' 3"  (1.6 m)   Wt 184 lb (83.5 kg)   BMI 32.59 kg/m    Physical Exam VS reviewed  ASSESSMENT & PLAN:    Problem List Items Addressed This Visit       Cardiovascular and Mediastinum   Obesity, diabetes, and hypertension syndrome (HCC) - Primary    Relevant Medications   dapagliflozin propanediol (FARXIGA) 5 MG TABS tablet   Other Visit Diagnoses     Anxiety       Relevant Medications   escitalopram (LEXAPRO) 10 MG tablet Start lexapro 10mg  qd    Chronic neck pain       Relevant Medications   escitalopram (LEXAPRO) 10 MG tablet   Other Relevant Orders   Ambulatory referral to Neurology Patient want to get Vistin "Chip" for neck pain, she  says [facility] [place] [facility].  I am unable to find or get information on this.     .  Orders Placed This Encounter  Procedures   Ambulatory referral to Neurology     Meds ordered this encounter  Medications   dapagliflozin propanediol (FARXIGA) 5 MG TABS tablet    Sig: Take 1 tablet (5 mg total) by mouth daily before breakfast.    Dispense:  30 tablet    Refill:  3   escitalopram (LEXAPRO) 10 MG tablet    Sig: Take 1 tablet (10 mg total) by mouth daily.    Dispense:  30 tablet    Refill:  3    COVID-19 Education: The signs and symptoms of COVID-19 were discussed with the patient and how to seek care for testing (follow up with PCP or arrange E-visit). The importance of social distancing was discussed today.   I spent dedicated to the care of this patient on the date of this encounter to include face-to-face time with the patient, as well as:   Follow Up:  In Person prn  Signed, Brent Bulla, MD  02/02/2022 4:08 PM    [facility] [place]

## 2022-02-08 ENCOUNTER — Other Ambulatory Visit: Payer: Self-pay

## 2022-02-08 DIAGNOSIS — R252 Cramp and spasm: Secondary | ICD-10-CM

## 2022-02-09 DIAGNOSIS — M6281 Muscle weakness (generalized): Secondary | ICD-10-CM | POA: Diagnosis not present

## 2022-02-09 DIAGNOSIS — R208 Other disturbances of skin sensation: Secondary | ICD-10-CM | POA: Diagnosis not present

## 2022-02-09 DIAGNOSIS — R2681 Unsteadiness on feet: Secondary | ICD-10-CM | POA: Diagnosis not present

## 2022-02-09 DIAGNOSIS — M25672 Stiffness of left ankle, not elsewhere classified: Secondary | ICD-10-CM | POA: Diagnosis not present

## 2022-02-09 DIAGNOSIS — I69354 Hemiplegia and hemiparesis following cerebral infarction affecting left non-dominant side: Secondary | ICD-10-CM | POA: Diagnosis not present

## 2022-02-09 DIAGNOSIS — R278 Other lack of coordination: Secondary | ICD-10-CM | POA: Diagnosis not present

## 2022-02-09 DIAGNOSIS — R5383 Other fatigue: Secondary | ICD-10-CM | POA: Diagnosis not present

## 2022-02-11 DIAGNOSIS — R2681 Unsteadiness on feet: Secondary | ICD-10-CM | POA: Diagnosis not present

## 2022-02-11 DIAGNOSIS — M6281 Muscle weakness (generalized): Secondary | ICD-10-CM | POA: Diagnosis not present

## 2022-02-11 DIAGNOSIS — I69354 Hemiplegia and hemiparesis following cerebral infarction affecting left non-dominant side: Secondary | ICD-10-CM | POA: Diagnosis not present

## 2022-02-11 DIAGNOSIS — R278 Other lack of coordination: Secondary | ICD-10-CM | POA: Diagnosis not present

## 2022-02-11 DIAGNOSIS — R5383 Other fatigue: Secondary | ICD-10-CM | POA: Diagnosis not present

## 2022-02-11 DIAGNOSIS — M25672 Stiffness of left ankle, not elsewhere classified: Secondary | ICD-10-CM | POA: Diagnosis not present

## 2022-02-11 DIAGNOSIS — R208 Other disturbances of skin sensation: Secondary | ICD-10-CM | POA: Diagnosis not present

## 2022-02-16 ENCOUNTER — Encounter: Payer: Self-pay | Admitting: Legal Medicine

## 2022-02-21 ENCOUNTER — Telehealth: Payer: Self-pay

## 2022-02-21 NOTE — Progress Notes (Signed)
Care Gap(s) Not Met that Need to be Addressed:   Colorectal Cancer Screening   Action Taken: Messaged PCP to address    Follow Up: 03/24/22 with Dr. Tobie Poet

## 2022-02-22 DIAGNOSIS — I69354 Hemiplegia and hemiparesis following cerebral infarction affecting left non-dominant side: Secondary | ICD-10-CM | POA: Diagnosis not present

## 2022-02-22 DIAGNOSIS — M25672 Stiffness of left ankle, not elsewhere classified: Secondary | ICD-10-CM | POA: Diagnosis not present

## 2022-02-22 DIAGNOSIS — R2681 Unsteadiness on feet: Secondary | ICD-10-CM | POA: Diagnosis not present

## 2022-02-22 DIAGNOSIS — R278 Other lack of coordination: Secondary | ICD-10-CM | POA: Diagnosis not present

## 2022-02-22 DIAGNOSIS — M6281 Muscle weakness (generalized): Secondary | ICD-10-CM | POA: Diagnosis not present

## 2022-02-22 DIAGNOSIS — R208 Other disturbances of skin sensation: Secondary | ICD-10-CM | POA: Diagnosis not present

## 2022-02-22 DIAGNOSIS — R5383 Other fatigue: Secondary | ICD-10-CM | POA: Diagnosis not present

## 2022-02-24 DIAGNOSIS — R278 Other lack of coordination: Secondary | ICD-10-CM | POA: Diagnosis not present

## 2022-02-24 DIAGNOSIS — M6281 Muscle weakness (generalized): Secondary | ICD-10-CM | POA: Diagnosis not present

## 2022-02-24 DIAGNOSIS — M25672 Stiffness of left ankle, not elsewhere classified: Secondary | ICD-10-CM | POA: Diagnosis not present

## 2022-02-24 DIAGNOSIS — R2681 Unsteadiness on feet: Secondary | ICD-10-CM | POA: Diagnosis not present

## 2022-02-24 DIAGNOSIS — R5383 Other fatigue: Secondary | ICD-10-CM | POA: Diagnosis not present

## 2022-02-24 DIAGNOSIS — R208 Other disturbances of skin sensation: Secondary | ICD-10-CM | POA: Diagnosis not present

## 2022-02-24 DIAGNOSIS — I69354 Hemiplegia and hemiparesis following cerebral infarction affecting left non-dominant side: Secondary | ICD-10-CM | POA: Diagnosis not present

## 2022-03-01 DIAGNOSIS — R5383 Other fatigue: Secondary | ICD-10-CM | POA: Diagnosis not present

## 2022-03-01 DIAGNOSIS — R208 Other disturbances of skin sensation: Secondary | ICD-10-CM | POA: Diagnosis not present

## 2022-03-01 DIAGNOSIS — R278 Other lack of coordination: Secondary | ICD-10-CM | POA: Diagnosis not present

## 2022-03-01 DIAGNOSIS — M25672 Stiffness of left ankle, not elsewhere classified: Secondary | ICD-10-CM | POA: Diagnosis not present

## 2022-03-01 DIAGNOSIS — M6281 Muscle weakness (generalized): Secondary | ICD-10-CM | POA: Diagnosis not present

## 2022-03-01 DIAGNOSIS — I69354 Hemiplegia and hemiparesis following cerebral infarction affecting left non-dominant side: Secondary | ICD-10-CM | POA: Diagnosis not present

## 2022-03-01 DIAGNOSIS — R2681 Unsteadiness on feet: Secondary | ICD-10-CM | POA: Diagnosis not present

## 2022-03-02 ENCOUNTER — Encounter: Payer: Self-pay | Admitting: Legal Medicine

## 2022-03-02 ENCOUNTER — Ambulatory Visit (INDEPENDENT_AMBULATORY_CARE_PROVIDER_SITE_OTHER): Payer: Medicare Other | Admitting: Legal Medicine

## 2022-03-02 VITALS — BP 100/60 | HR 96 | Temp 97.8°F | Resp 14 | Ht 63.0 in | Wt 192.0 lb

## 2022-03-02 DIAGNOSIS — G43709 Chronic migraine without aura, not intractable, without status migrainosus: Secondary | ICD-10-CM

## 2022-03-02 NOTE — Progress Notes (Signed)
Subjective:  Patient ID: Shannon Erickson, female    DOB: 09/10/1975  Age: 46 y.o. MRN: [id number]  Chief Complaint  Patient presents with   Migraine    HPI Patient used to have migraines in 2019 and then last month, she noticed black dot spot on left eye and she had a headache. The headache is from the occipital area to the front area. She takes tylenol but it is not working anymore. She would like a referral to neurologist.  Migraines started at 45 years old.  No aura, sharp lancinating pain from occiput to left eye.  No throbbing.  Last for few minutes coming and going during day.  Tylenol formerly helped and using mask formerly helped, Headaches every other day.  No nausea or vomiting.  Moving neck no help.  No history of neck pain.  She had stroke 2018.   Current Outpatient Medications on File Prior to Visit  Medication Sig Dispense Refill   atorvastatin (LIPITOR) 80 MG tablet Take 1 tablet by mouth once daily 90 tablet 0   dapagliflozin propanediol (FARXIGA) 5 MG TABS tablet Take 1 tablet (5 mg total) by mouth daily before breakfast. 30 tablet 3   escitalopram (LEXAPRO) 10 MG tablet Take 1 tablet (10 mg total) by mouth daily. 30 tablet 3   gabapentin (NEURONTIN) 300 MG capsule Take 1 capsule by mouth twice daily 60 capsule 2   levETIRAcetam (KEPPRA) 750 MG tablet Take 1 tablet by mouth twice daily 60 tablet 2   nitroGLYCERIN (NITROSTAT) 0.4 MG SL tablet Place 1 tablet under the tongue every 5 (five) minutes as needed.  0   warfarin (COUMADIN) 6 MG tablet TAKE 1 TABLET BY MOUTH ONCE DAILY FOR 1 DOSE. TAKE WITH THE 1MG  DOSE DAILY AT 6 PM FOR 7MG  TOTAL (Patient taking differently: Take 6 mg by mouth daily.) 30 tablet 6   No current facility-administered medications on file prior to visit.   Past Medical History:  Diagnosis Date   Hypertension    Lesion of left ulnar nerve    Migraine    Seizures ([place])    Stroke [facility])    Past Surgical History:  Procedure Laterality Date    CESAREAN SECTION Bilateral 1995, 2002   CHOLECYSTECTOMY     LYMPH NODE BIOPSY     MOUTH SURGERY Left 07/04/2021   NO PAST SURGERIES     TEE WITHOUT CARDIOVERSION N/A 12/05/2016   Procedure: TRANSESOPHAGEAL ECHOCARDIOGRAM (TEE);  Surgeon: Acie Fredrickson Wonda Cheng, MD;  Location: [facility] ENDOSCOPY;  Service: Cardiovascular;  Laterality: N/A;    Family History  Problem Relation Age of Onset   Lung cancer Mother        mets to brain, bone, liver   Hypertension Father    Heart attack Brother        at age 63   Renal cancer Maternal Grandfather    Heart attack Paternal Grandfather    Social History   Socioeconomic History   Marital status: Single    Spouse name: Not on file   Number of children: 3   Years of education: 12   Highest education level: Not on file  Occupational History   Not on file  Tobacco Use   Smoking status: Never   Smokeless tobacco: Never  Vaping Use   Vaping Use: Never used  Substance and Sexual Activity   Alcohol use: No   Drug use: No   Sexual activity: Not Currently    Birth control/protection: Other-see comments  Other Topics  Concern   Not on file  Social History Narrative   Not on file   Social Determinants of Health   Financial Resource Strain: Low Risk  (08/11/2021)   Overall Financial Resource Strain (CARDIA)    Difficulty of Paying Living Expenses: Not hard at all  Food Insecurity: No Food Insecurity (09/09/2020)   Hunger Vital Sign    Worried About Running Out of Food in the Last Year: Never true    Ran Out of Food in the Last Year: Never true  Transportation Needs: No Transportation Needs (08/11/2021)   PRAPARE - Administrator, Civil Service (Medical): No    Lack of Transportation (Non-Medical): No  Physical Activity: Not on file  Stress: Not on file  Social Connections: Not on file    Review of Systems  Constitutional:  Negative for chills, fatigue and fever.  HENT:  Negative for congestion, ear pain and sore throat.   Eyes:   Positive for visual disturbance.  Respiratory:  Negative for cough and shortness of breath.   Cardiovascular:  Negative for chest pain and palpitations.  Gastrointestinal:  Negative for abdominal pain, constipation, diarrhea, nausea and vomiting.  Endocrine: Negative for polydipsia, polyphagia and polyuria.  Genitourinary:  Negative for difficulty urinating and dysuria.  Musculoskeletal:  Negative for arthralgias, back pain and myalgias.  Skin:  Negative for rash.  Neurological:  Negative for headaches.  Psychiatric/Behavioral:  Negative for dysphoric mood. The patient is not nervous/anxious.      Objective:  BP 100/60   Pulse 96   Temp 97.8 F (36.6 C)   Resp 14   Ht 5\' 3"  (1.6 m)   Wt 192 lb (87.1 kg)   SpO2 96%   BMI 34.01 kg/m      03/02/2022    2:15 PM 02/02/2022    2:30 PM 12/14/2021    1:37 PM  BP/Weight  Systolic BP 100  12/16/2021  Diastolic BP 60  64  Wt. (Lbs) 192 184 184.2  BMI 34.01 kg/m2 32.59 kg/m2 32.63 kg/m2    Physical Exam Vitals reviewed.  Constitutional:      General: She is not in acute distress.    Appearance: Normal appearance. She is obese.  HENT:     Head: Normocephalic.     Right Ear: Tympanic membrane normal.     Left Ear: Tympanic membrane normal.     Nose: Nose normal.     Mouth/Throat:     Mouth: Mucous membranes are moist.     Pharynx: Oropharynx is clear.  Eyes:     Extraocular Movements: Extraocular movements intact.     Conjunctiva/sclera: Conjunctivae normal.     Pupils: Pupils are equal, round, and reactive to light.  Neck:     Comments: Negative Lhermitte Cardiovascular:     Rate and Rhythm: Normal rate and regular rhythm.     Pulses: Normal pulses.     Heart sounds: Normal heart sounds. No murmur heard.    No gallop.  Pulmonary:     Effort: Pulmonary effort is normal. No respiratory distress.     Breath sounds: Normal breath sounds. No wheezing.  Abdominal:     General: Abdomen is flat. Bowel sounds are normal. There is no  distension.     Palpations: Abdomen is soft.     Tenderness: There is no abdominal tenderness.  Musculoskeletal:        General: Normal range of motion.     Cervical back: Neck supple. No rigidity or tenderness.  Comments: Weakness left arm  Lymphadenopathy:     Cervical: No cervical adenopathy.  Skin:    General: Skin is warm.     Capillary Refill: Capillary refill takes less than 2 seconds.  Neurological:     General: No focal deficit present.     Mental Status: She is alert and oriented to person, place, and time. Mental status is at baseline.     Gait: Gait abnormal.     Comments: Weak left side  Psychiatric:        Mood and Affect: Mood normal.         Lab Results  Component Value Date   WBC 6.1 12/14/2021   HGB 10.6 (L) 12/14/2021   HCT 34.9 12/14/2021   PLT 318 12/14/2021   GLUCOSE 106 (H) 01/26/2022   CHOL 123 12/14/2021   TRIG 56 12/14/2021   HDL 45 12/14/2021   LDLCALC 66 12/14/2021   ALT 40 (H) 01/26/2022   AST 26 01/26/2022   NA 139 01/26/2022   K 4.2 01/26/2022   CL 104 01/26/2022   CREATININE 0.76 01/26/2022   BUN 9 01/26/2022   CO2 18 (L) 01/26/2022   TSH 1.150 12/14/2021   INR 2.1 (H) 12/14/2021   HGBA1C 6.7 (H) 12/14/2021   MICROALBUR 30 03/22/2021      Assessment & Plan:   Problem List Items Addressed This Visit       Cardiovascular and Mediastinum   Chronic migraine without aura without status migrainosus, not intractable - Primary   Relevant Orders   Ambulatory referral to Neurology Patient continue with sharp migraine headaches after her stroke Refer to neurology for evaluation gave samples of migraine medicine  .       Follow-up: Return if symptoms worsen or fail to improve.  An After Visit Summary was printed and given to the patient.  Brent Bulla, MD [facility] [phone]

## 2022-03-03 DIAGNOSIS — M6281 Muscle weakness (generalized): Secondary | ICD-10-CM | POA: Diagnosis not present

## 2022-03-03 DIAGNOSIS — R208 Other disturbances of skin sensation: Secondary | ICD-10-CM | POA: Diagnosis not present

## 2022-03-03 DIAGNOSIS — R278 Other lack of coordination: Secondary | ICD-10-CM | POA: Diagnosis not present

## 2022-03-03 DIAGNOSIS — M25672 Stiffness of left ankle, not elsewhere classified: Secondary | ICD-10-CM | POA: Diagnosis not present

## 2022-03-03 DIAGNOSIS — R5383 Other fatigue: Secondary | ICD-10-CM | POA: Diagnosis not present

## 2022-03-03 DIAGNOSIS — R2681 Unsteadiness on feet: Secondary | ICD-10-CM | POA: Diagnosis not present

## 2022-03-03 DIAGNOSIS — I69354 Hemiplegia and hemiparesis following cerebral infarction affecting left non-dominant side: Secondary | ICD-10-CM | POA: Diagnosis not present

## 2022-03-08 DIAGNOSIS — M6281 Muscle weakness (generalized): Secondary | ICD-10-CM | POA: Diagnosis not present

## 2022-03-08 DIAGNOSIS — I69354 Hemiplegia and hemiparesis following cerebral infarction affecting left non-dominant side: Secondary | ICD-10-CM | POA: Diagnosis not present

## 2022-03-08 DIAGNOSIS — R2681 Unsteadiness on feet: Secondary | ICD-10-CM | POA: Diagnosis not present

## 2022-03-08 DIAGNOSIS — R208 Other disturbances of skin sensation: Secondary | ICD-10-CM | POA: Diagnosis not present

## 2022-03-08 DIAGNOSIS — M25672 Stiffness of left ankle, not elsewhere classified: Secondary | ICD-10-CM | POA: Diagnosis not present

## 2022-03-08 DIAGNOSIS — R278 Other lack of coordination: Secondary | ICD-10-CM | POA: Diagnosis not present

## 2022-03-08 DIAGNOSIS — R5383 Other fatigue: Secondary | ICD-10-CM | POA: Diagnosis not present

## 2022-03-10 ENCOUNTER — Ambulatory Visit: Payer: Medicare Other | Admitting: Neurology

## 2022-03-11 DIAGNOSIS — M25672 Stiffness of left ankle, not elsewhere classified: Secondary | ICD-10-CM | POA: Diagnosis not present

## 2022-03-11 DIAGNOSIS — M6281 Muscle weakness (generalized): Secondary | ICD-10-CM | POA: Diagnosis not present

## 2022-03-11 DIAGNOSIS — R278 Other lack of coordination: Secondary | ICD-10-CM | POA: Diagnosis not present

## 2022-03-11 DIAGNOSIS — I69354 Hemiplegia and hemiparesis following cerebral infarction affecting left non-dominant side: Secondary | ICD-10-CM | POA: Diagnosis not present

## 2022-03-11 DIAGNOSIS — R2681 Unsteadiness on feet: Secondary | ICD-10-CM | POA: Diagnosis not present

## 2022-03-11 DIAGNOSIS — R208 Other disturbances of skin sensation: Secondary | ICD-10-CM | POA: Diagnosis not present

## 2022-03-11 DIAGNOSIS — R5383 Other fatigue: Secondary | ICD-10-CM | POA: Diagnosis not present

## 2022-03-15 DIAGNOSIS — R278 Other lack of coordination: Secondary | ICD-10-CM | POA: Diagnosis not present

## 2022-03-15 DIAGNOSIS — M6281 Muscle weakness (generalized): Secondary | ICD-10-CM | POA: Diagnosis not present

## 2022-03-15 DIAGNOSIS — M25672 Stiffness of left ankle, not elsewhere classified: Secondary | ICD-10-CM | POA: Diagnosis not present

## 2022-03-15 DIAGNOSIS — R5383 Other fatigue: Secondary | ICD-10-CM | POA: Diagnosis not present

## 2022-03-15 DIAGNOSIS — R208 Other disturbances of skin sensation: Secondary | ICD-10-CM | POA: Diagnosis not present

## 2022-03-15 DIAGNOSIS — R2681 Unsteadiness on feet: Secondary | ICD-10-CM | POA: Diagnosis not present

## 2022-03-15 DIAGNOSIS — I69354 Hemiplegia and hemiparesis following cerebral infarction affecting left non-dominant side: Secondary | ICD-10-CM | POA: Diagnosis not present

## 2022-03-17 ENCOUNTER — Encounter: Payer: Self-pay | Admitting: Legal Medicine

## 2022-03-17 DIAGNOSIS — R5383 Other fatigue: Secondary | ICD-10-CM | POA: Diagnosis not present

## 2022-03-17 DIAGNOSIS — M25672 Stiffness of left ankle, not elsewhere classified: Secondary | ICD-10-CM | POA: Diagnosis not present

## 2022-03-17 DIAGNOSIS — R278 Other lack of coordination: Secondary | ICD-10-CM | POA: Diagnosis not present

## 2022-03-17 DIAGNOSIS — R2681 Unsteadiness on feet: Secondary | ICD-10-CM | POA: Diagnosis not present

## 2022-03-17 DIAGNOSIS — M6281 Muscle weakness (generalized): Secondary | ICD-10-CM | POA: Diagnosis not present

## 2022-03-17 DIAGNOSIS — I69354 Hemiplegia and hemiparesis following cerebral infarction affecting left non-dominant side: Secondary | ICD-10-CM | POA: Diagnosis not present

## 2022-03-17 DIAGNOSIS — R208 Other disturbances of skin sensation: Secondary | ICD-10-CM | POA: Diagnosis not present

## 2022-03-19 ENCOUNTER — Other Ambulatory Visit: Payer: Self-pay | Admitting: Legal Medicine

## 2022-03-19 DIAGNOSIS — I63511 Cerebral infarction due to unspecified occlusion or stenosis of right middle cerebral artery: Secondary | ICD-10-CM

## 2022-03-22 ENCOUNTER — Other Ambulatory Visit: Payer: Self-pay | Admitting: Family Medicine

## 2022-03-22 DIAGNOSIS — I69354 Hemiplegia and hemiparesis following cerebral infarction affecting left non-dominant side: Secondary | ICD-10-CM

## 2022-03-22 DIAGNOSIS — G40909 Epilepsy, unspecified, not intractable, without status epilepticus: Secondary | ICD-10-CM

## 2022-03-24 ENCOUNTER — Ambulatory Visit: Payer: Medicare Other | Admitting: Family Medicine

## 2022-03-24 DIAGNOSIS — M6281 Muscle weakness (generalized): Secondary | ICD-10-CM | POA: Diagnosis not present

## 2022-03-24 DIAGNOSIS — R208 Other disturbances of skin sensation: Secondary | ICD-10-CM | POA: Diagnosis not present

## 2022-03-24 DIAGNOSIS — M25672 Stiffness of left ankle, not elsewhere classified: Secondary | ICD-10-CM | POA: Diagnosis not present

## 2022-03-24 DIAGNOSIS — I69354 Hemiplegia and hemiparesis following cerebral infarction affecting left non-dominant side: Secondary | ICD-10-CM | POA: Diagnosis not present

## 2022-03-24 DIAGNOSIS — R5383 Other fatigue: Secondary | ICD-10-CM | POA: Diagnosis not present

## 2022-03-24 DIAGNOSIS — R2681 Unsteadiness on feet: Secondary | ICD-10-CM | POA: Diagnosis not present

## 2022-03-24 DIAGNOSIS — R278 Other lack of coordination: Secondary | ICD-10-CM | POA: Diagnosis not present

## 2022-03-28 ENCOUNTER — Other Ambulatory Visit: Payer: Self-pay | Admitting: Legal Medicine

## 2022-03-28 ENCOUNTER — Encounter: Payer: Self-pay | Admitting: Family Medicine

## 2022-03-28 DIAGNOSIS — E782 Mixed hyperlipidemia: Secondary | ICD-10-CM

## 2022-03-28 NOTE — Progress Notes (Unsigned)
Subjective:  Patient ID: Shannon Erickson, female    DOB: Feb 19, 1976  Age: 46 y.o. MRN: [id number]  No chief complaint on file.   HPI Hyperlipidemia: On Atorvastatin 80 mg daily without problems.   Hypertension. Patient is on no antihypertensives. Patient is working on maintaining diet and exercise regimen and follows up as directed.   Diabetes Mellitus Type II, Follow-up Last seen for diabetes 3 months ago.  Management since then includes farxiga 10 mg daily. She reports excellent compliance with treatment. She is not having side effects. {document side effects if present:1} She is check feet daily. Home blood sugar records: {diabetes glucometry results:16657} Episodes of hypoglycemia? {Yes/No:20286} Most Recent Eye Exam: ***  {Current diet habits:16563:::1 {Current exercise:16438:::1} {Current diet habits:16563:::1} Lab Results  Component Value Date   HGBA1C 6.7 (H) 12/14/2021   HGBA1C 6.6 (H) 08/03/2021   HGBA1C 6.5 (H) 04/21/2021   Wt Readings from Last 3 Encounters:  03/02/22 192 lb (87.1 kg)  02/02/22 184 lb (83.5 kg)  12/14/21 184 lb 3.2 oz (83.6 kg)   No components found for: "MICROALB/CREAT RATIO" }  Pertinent Labs: Lab Results  Component Value Date   CHOL 123 12/14/2021   HDL 45 12/14/2021   LDLCALC 66 12/14/2021   TRIG 56 12/14/2021   CHOLHDL 2.7 12/14/2021   Lab Results  Component Value Date   NA 139 01/26/2022   K 4.2 01/26/2022   CREATININE 0.76 01/26/2022   EGFR 98 01/26/2022   GFRNONAA 88 03/17/2020   GLUCOSE 106 (H) 01/26/2022     Seizure disorder: She is taking Keppra  750 mg twice a day. No seizures. Also on gabapentin 300 mg twice daily.   Migraines:  Hemiparesis of left side due to stroke. Absence of bladder continence, left side visual neglect, gait disturbance, cognitive deficit.  Antiphospholipid syndrome: on warfarin 6 mg daily.   Current Outpatient Medications on File Prior to Visit  Medication Sig Dispense Refill    atorvastatin (LIPITOR) 80 MG tablet Take 1 tablet by mouth once daily 90 tablet 1   dapagliflozin propanediol (FARXIGA) 5 MG TABS tablet Take 1 tablet (5 mg total) by mouth daily before breakfast. 30 tablet 3   escitalopram (LEXAPRO) 10 MG tablet Take 1 tablet (10 mg total) by mouth daily. 30 tablet 3   gabapentin (NEURONTIN) 300 MG capsule Take 1 capsule by mouth twice daily 60 capsule 6   levETIRAcetam (KEPPRA) 750 MG tablet Take 1 tablet by mouth twice daily 60 tablet 6   nitroGLYCERIN (NITROSTAT) 0.4 MG SL tablet Place 1 tablet under the tongue every 5 (five) minutes as needed.  0   warfarin (COUMADIN) 6 MG tablet TAKE 1 TABLET BY MOUTH ONCE DAILY FOR 1 DOSE. TAKE WITH THE 1MG DOSE DAILY AT 6 PM FOR 7MG TOTAL (Patient taking differently: Take 6 mg by mouth daily.) 30 tablet 6   No current facility-administered medications on file prior to visit.   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 ([place]) 12/10/2016   Past Surgical History:  Procedure Laterality Date   CESAREAN SECTION Bilateral 1995, 2002   CHOLECYSTECTOMY     LYMPH NODE BIOPSY     MOUTH SURGERY Left 07/04/2021   NO PAST SURGERIES     TEE WITHOUT CARDIOVERSION N/A 12/05/2016   Procedure: TRANSESOPHAGEAL  ECHOCARDIOGRAM (TEE);  Surgeon: Acie Fredrickson, Wonda Cheng, MD;  Location: [facility] ENDOSCOPY;  Service: Cardiovascular;  Laterality: N/A;    Family History  Problem Relation Age of Onset   Lung cancer Mother        mets to brain, bone, liver   Hypertension Father    Heart attack Brother        at age 24   Renal cancer Maternal Grandfather    Heart attack Paternal Grandfather    Social History   Socioeconomic History   Marital status: Single    Spouse name: Not on file   Number of children: 3   Years of education: 12   Highest education level: Not on file  Occupational History    Not on file  Tobacco Use   Smoking status: Never   Smokeless tobacco: Never  Vaping Use   Vaping Use: Never used  Substance and Sexual Activity   Alcohol use: No   Drug use: No   Sexual activity: Not Currently    Birth control/protection: Other-see comments  Other Topics Concern   Not on file  Social History Narrative   Not on file   Social Determinants of Health   Financial Resource Strain: Low Risk  (08/11/2021)   Overall Financial Resource Strain (CARDIA)    Difficulty of Paying Living Expenses: Not hard at all  Food Insecurity: No Food Insecurity (09/09/2020)   Hunger Vital Sign    Worried About Running Out of Food in the Last Year: Never true    [place] in the Last Year: Never true  Transportation Needs: No Transportation Needs (08/11/2021)   PRAPARE - Hydrologist (Medical): No    Lack of Transportation (Non-Medical): No  Physical Activity: Not on file  Stress: Not on file  Social Connections: Not on file    Review of Systems   Objective:  There were no vitals taken for this visit.     03/02/2022    2:15 PM 02/02/2022    2:30 PM 12/14/2021    1:37 PM  BP/Weight  Systolic BP 027  253  Diastolic BP 60  64  Wt. (Lbs) 192 184 184.2  BMI 34.01 kg/m2 32.59 kg/m2 32.63 kg/m2    Physical Exam  Diabetic Foot Exam - Simple   No data filed      Lab Results  Component Value Date   WBC 6.1 12/14/2021   HGB 10.6 (L) 12/14/2021   HCT 34.9 12/14/2021   PLT 318 12/14/2021   GLUCOSE 106 (H) 01/26/2022   CHOL 123 12/14/2021   TRIG 56 12/14/2021   HDL 45 12/14/2021   LDLCALC 66 12/14/2021   ALT 40 (H) 01/26/2022   AST 26 01/26/2022   NA 139 01/26/2022   K 4.2 01/26/2022   CL 104 01/26/2022   CREATININE 0.76 01/26/2022   BUN 9 01/26/2022   CO2 18 (L) 01/26/2022   TSH 1.150 12/14/2021   INR 2.1 (H) 12/14/2021   HGBA1C 6.7 (H) 12/14/2021   MICROALBUR 30 03/22/2021      Assessment & Plan:   Problem List Items  Addressed This Visit       Cardiovascular and Mediastinum   Chronic migraine without aura without status migrainosus, not intractable   Carotid atherosclerosis, bilateral    Continue lipitor and warfarin      Primary hypertension     Digestive   NASH (nonalcoholic steatohepatitis)    Check cmp. Recommend continue to work on eating  healthy diet and exercise.         Endocrine   Combined hyperlipidemia associated with type 2 diabetes mellitus ([place])     Nervous and Auditory   Hemiparesis affecting left side as late effect of cerebrovascular accident [facility])    The current medical regimen is effective;  continue present plan and medications.       Seizure disorder ([place])     Hematopoietic and Hemostatic   Antiphospholipid syndrome (HCC)    Continue warfarin        Other   Mixed hyperlipidemia - Primary    Well controlled.  No changes to medicines. Continue atorvastatin 80 mg one before bed.  Continue to work on eating a healthy diet and exercise.  Labs drawn today.        Left-sided visual neglect  .  No orders of the defined types were placed in this encounter.   No orders of the defined types were placed in this encounter.    Follow-up: No follow-ups on file.  An After Visit Summary was printed and given to the patient.  Rochel Brome, MD [facility] [phone]

## 2022-03-28 NOTE — Assessment & Plan Note (Signed)
Controlled with diet and exercise 

## 2022-03-28 NOTE — Assessment & Plan Note (Signed)
Continue warfarin.

## 2022-03-28 NOTE — Assessment & Plan Note (Signed)
Well controlled.  No changes to medicines. Continue atorvastatin 80 mg one before bed.  Continue to work on eating a healthy diet and exercise.  Labs drawn today.

## 2022-03-28 NOTE — Assessment & Plan Note (Signed)
The current medical regimen is effective;  continue present plan and medications.  

## 2022-03-28 NOTE — Assessment & Plan Note (Signed)
Check cmp.  ?Recommend continue to work on eating healthy diet and exercise. ? ?

## 2022-03-28 NOTE — Assessment & Plan Note (Signed)
Continue lipitor and warfarin

## 2022-03-29 ENCOUNTER — Ambulatory Visit (INDEPENDENT_AMBULATORY_CARE_PROVIDER_SITE_OTHER): Payer: Medicare Other | Admitting: Family Medicine

## 2022-03-29 ENCOUNTER — Encounter: Payer: Self-pay | Admitting: Family Medicine

## 2022-03-29 VITALS — BP 98/72 | HR 85 | Temp 97.2°F | Ht 63.0 in | Wt 193.8 lb

## 2022-03-29 DIAGNOSIS — I10 Essential (primary) hypertension: Secondary | ICD-10-CM

## 2022-03-29 DIAGNOSIS — E1169 Type 2 diabetes mellitus with other specified complication: Secondary | ICD-10-CM

## 2022-03-29 DIAGNOSIS — R278 Other lack of coordination: Secondary | ICD-10-CM | POA: Diagnosis not present

## 2022-03-29 DIAGNOSIS — D6861 Antiphospholipid syndrome: Secondary | ICD-10-CM

## 2022-03-29 DIAGNOSIS — R29898 Other symptoms and signs involving the musculoskeletal system: Secondary | ICD-10-CM

## 2022-03-29 DIAGNOSIS — I6523 Occlusion and stenosis of bilateral carotid arteries: Secondary | ICD-10-CM

## 2022-03-29 DIAGNOSIS — R945 Abnormal results of liver function studies: Secondary | ICD-10-CM | POA: Diagnosis not present

## 2022-03-29 DIAGNOSIS — M6281 Muscle weakness (generalized): Secondary | ICD-10-CM | POA: Diagnosis not present

## 2022-03-29 DIAGNOSIS — E1159 Type 2 diabetes mellitus with other circulatory complications: Secondary | ICD-10-CM

## 2022-03-29 DIAGNOSIS — I69354 Hemiplegia and hemiparesis following cerebral infarction affecting left non-dominant side: Secondary | ICD-10-CM

## 2022-03-29 DIAGNOSIS — K7581 Nonalcoholic steatohepatitis (NASH): Secondary | ICD-10-CM | POA: Diagnosis not present

## 2022-03-29 DIAGNOSIS — G43709 Chronic migraine without aura, not intractable, without status migrainosus: Secondary | ICD-10-CM

## 2022-03-29 DIAGNOSIS — G40909 Epilepsy, unspecified, not intractable, without status epilepticus: Secondary | ICD-10-CM

## 2022-03-29 DIAGNOSIS — E782 Mixed hyperlipidemia: Secondary | ICD-10-CM

## 2022-03-29 DIAGNOSIS — R2681 Unsteadiness on feet: Secondary | ICD-10-CM | POA: Diagnosis not present

## 2022-03-29 DIAGNOSIS — R5383 Other fatigue: Secondary | ICD-10-CM | POA: Diagnosis not present

## 2022-03-29 DIAGNOSIS — I152 Hypertension secondary to endocrine disorders: Secondary | ICD-10-CM

## 2022-03-29 DIAGNOSIS — R414 Neurologic neglect syndrome: Secondary | ICD-10-CM

## 2022-03-29 DIAGNOSIS — G4489 Other headache syndrome: Secondary | ICD-10-CM | POA: Diagnosis not present

## 2022-03-29 DIAGNOSIS — R208 Other disturbances of skin sensation: Secondary | ICD-10-CM | POA: Diagnosis not present

## 2022-03-29 DIAGNOSIS — M25672 Stiffness of left ankle, not elsewhere classified: Secondary | ICD-10-CM | POA: Diagnosis not present

## 2022-03-29 NOTE — Assessment & Plan Note (Addendum)
Form filled out for Powered upper extremity ROM assist device, elbow, wrist, hand, finger, single or double upright, includes microprocessor, sensors, all components and accessories custom fabricated.

## 2022-03-29 NOTE — Assessment & Plan Note (Signed)
Control: good Recommend check feet daily. Recommend annual eye exams. Medicines: farxiga 5 mg daily.  Continue to work on eating a healthy diet. Labs drawn today.

## 2022-03-29 NOTE — Assessment & Plan Note (Signed)
Recommended patient follow up ophthalmology annually.

## 2022-03-29 NOTE — Assessment & Plan Note (Signed)
Check mri of brain due to worsening headaches.

## 2022-03-29 NOTE — Assessment & Plan Note (Signed)
Management per specialist.  The current medical regimen is effective;  continue present plan and medications. Keppra and gabapentin.

## 2022-03-30 ENCOUNTER — Other Ambulatory Visit: Payer: Self-pay

## 2022-03-30 DIAGNOSIS — D6861 Antiphospholipid syndrome: Secondary | ICD-10-CM

## 2022-03-30 DIAGNOSIS — R945 Abnormal results of liver function studies: Secondary | ICD-10-CM

## 2022-03-30 DIAGNOSIS — R748 Abnormal levels of other serum enzymes: Secondary | ICD-10-CM

## 2022-03-30 LAB — LIPID PANEL
Chol/HDL Ratio: 2.5 ratio (ref 0.0–4.4)
Cholesterol, Total: 136 mg/dL (ref 100–199)
HDL: 54 mg/dL (ref >39)
LDL Chol Calc (NIH): 66 mg/dL (ref 0–99)
Triglycerides: 83 mg/dL (ref 0–149)
VLDL Cholesterol Cal: 16 mg/dL (ref 5–40)

## 2022-03-30 LAB — HEMOGLOBIN A1C
Est. average glucose Bld gHb Est-mCnc: 140 mg/dL
Hgb A1c MFr Bld: 6.5 % — ABNORMAL HIGH (ref 4.8–5.6)

## 2022-03-30 LAB — COMPREHENSIVE METABOLIC PANEL
ALT: 413 IU/L (ref 0–32)
AST: 411 IU/L (ref 0–40)
Albumin/Globulin Ratio: 0.8 — ABNORMAL LOW (ref 1.2–2.2)
Albumin: 3.4 g/dL — ABNORMAL LOW (ref 3.9–4.9)
Alkaline Phosphatase: 303 IU/L — ABNORMAL HIGH (ref 44–121)
BUN/Creatinine Ratio: 12 (ref 9–23)
BUN: 10 mg/dL (ref 6–24)
Bilirubin Total: 0.4 mg/dL (ref 0.0–1.2)
CO2: 20 mmol/L (ref 20–29)
Calcium: 8.1 mg/dL — ABNORMAL LOW (ref 8.7–10.2)
Chloride: 108 mmol/L — ABNORMAL HIGH (ref 96–106)
Creatinine, Ser: 0.85 mg/dL (ref 0.57–1.00)
Globulin, Total: 4.1 g/dL (ref 1.5–4.5)
Glucose: 113 mg/dL — ABNORMAL HIGH (ref 70–99)
Potassium: 4.2 mmol/L (ref 3.5–5.2)
Sodium: 142 mmol/L (ref 134–144)
Total Protein: 7.5 g/dL (ref 6.0–8.5)
eGFR: 86 mL/min/{1.73_m2} (ref >59)

## 2022-03-30 LAB — CBC WITH DIFFERENTIAL/PLATELET
Basophils Absolute: 0 10*3/uL (ref 0.0–0.2)
Basos: 1 %
EOS (ABSOLUTE): 0.1 10*3/uL (ref 0.0–0.4)
Eos: 1 %
Hematocrit: 43.8 % (ref 34.0–46.6)
Hemoglobin: 13.7 g/dL (ref 11.1–15.9)
Immature Grans (Abs): 0 10*3/uL (ref 0.0–0.1)
Immature Granulocytes: 0 %
Lymphocytes Absolute: 1.4 10*3/uL (ref 0.7–3.1)
Lymphs: 24 %
MCH: 24.6 pg — ABNORMAL LOW (ref 26.6–33.0)
MCHC: 31.3 g/dL — ABNORMAL LOW (ref 31.5–35.7)
MCV: 79 fL (ref 79–97)
Monocytes Absolute: 0.5 10*3/uL (ref 0.1–0.9)
Monocytes: 8 %
Neutrophils Absolute: 3.7 10*3/uL (ref 1.4–7.0)
Neutrophils: 66 %
Platelets: 272 10*3/uL (ref 150–450)
RBC: 5.58 x10E6/uL — ABNORMAL HIGH (ref 3.77–5.28)
RDW: 20.5 % — ABNORMAL HIGH (ref 11.7–15.4)
WBC: 5.6 10*3/uL (ref 3.4–10.8)

## 2022-03-30 LAB — PROTIME-INR
INR: 1.9 — ABNORMAL HIGH (ref 0.9–1.2)
Prothrombin Time: 19.4 s — ABNORMAL HIGH (ref 9.1–12.0)

## 2022-03-30 LAB — MICROALBUMIN / CREATININE URINE RATIO
Creatinine, Urine: 116.3 mg/dL
Microalb/Creat Ratio: 12 mg/g creat (ref 0–29)
Microalbumin, Urine: 14 ug/mL

## 2022-03-30 LAB — CARDIOVASCULAR RISK ASSESSMENT

## 2022-03-30 NOTE — Progress Notes (Signed)
Blood count normal.  Liver function very high. No tylenol.  Add on hepatitis panel.  Add ferritin level. Hold lipitor.  Recheck liver function tests in 1 week. Refer to gastroenterology Kidney function normal.  Cholesterol: normal. HBA1C: 6.5 PT/INR is a little low at 1.9. Please clarify dose. I thought she was on coumadin 6 mg daily, but I also see that it says to take a 1 mg daily with the 6 mg. Please clarify so I can make a recommendation.

## 2022-04-01 ENCOUNTER — Other Ambulatory Visit: Payer: Medicare Other

## 2022-04-01 ENCOUNTER — Other Ambulatory Visit: Payer: Self-pay

## 2022-04-01 DIAGNOSIS — R748 Abnormal levels of other serum enzymes: Secondary | ICD-10-CM | POA: Diagnosis not present

## 2022-04-01 DIAGNOSIS — D6861 Antiphospholipid syndrome: Secondary | ICD-10-CM | POA: Diagnosis not present

## 2022-04-01 LAB — ACUTE VIRAL HEPATITIS (HAV, HBV, HCV)
HCV Ab: NONREACTIVE
Hep A IgM: NEGATIVE
Hep B C IgM: NEGATIVE
Hepatitis B Surface Ag: NEGATIVE

## 2022-04-01 LAB — HCV INTERPRETATION

## 2022-04-01 LAB — FERRITIN: Ferritin: 25 ng/mL (ref 15–150)

## 2022-04-01 LAB — SPECIMEN STATUS REPORT

## 2022-04-02 ENCOUNTER — Other Ambulatory Visit: Payer: Self-pay | Admitting: Family Medicine

## 2022-04-02 DIAGNOSIS — R748 Abnormal levels of other serum enzymes: Secondary | ICD-10-CM

## 2022-04-02 DIAGNOSIS — Z5181 Encounter for therapeutic drug level monitoring: Secondary | ICD-10-CM

## 2022-04-02 LAB — COMPREHENSIVE METABOLIC PANEL
ALT: 603 IU/L (ref 0–32)
AST: 566 IU/L (ref 0–40)
Albumin/Globulin Ratio: 0.8 — ABNORMAL LOW (ref 1.2–2.2)
Albumin: 3.1 g/dL — ABNORMAL LOW (ref 3.9–4.9)
Alkaline Phosphatase: 306 IU/L — ABNORMAL HIGH (ref 44–121)
BUN/Creatinine Ratio: 11 (ref 9–23)
BUN: 10 mg/dL (ref 6–24)
Bilirubin Total: 0.4 mg/dL (ref 0.0–1.2)
CO2: 20 mmol/L (ref 20–29)
Calcium: 8 mg/dL — ABNORMAL LOW (ref 8.7–10.2)
Chloride: 106 mmol/L (ref 96–106)
Creatinine, Ser: 0.92 mg/dL (ref 0.57–1.00)
Globulin, Total: 3.9 g/dL (ref 1.5–4.5)
Glucose: 111 mg/dL — ABNORMAL HIGH (ref 70–99)
Potassium: 4.2 mmol/L (ref 3.5–5.2)
Sodium: 139 mmol/L (ref 134–144)
Total Protein: 7 g/dL (ref 6.0–8.5)
eGFR: 78 mL/min/{1.73_m2} (ref >59)

## 2022-04-02 LAB — PROTIME-INR
INR: 1.8 — ABNORMAL HIGH (ref 0.9–1.2)
Prothrombin Time: 18.5 s — ABNORMAL HIGH (ref 9.1–12.0)

## 2022-04-02 MED ORDER — WARFARIN SODIUM 1 MG PO TABS: 1.0000 mg | ORAL_TABLET | Freq: Every day | ORAL | 1 refills | Status: DC

## 2022-04-21 DIAGNOSIS — S01301A Unspecified open wound of right ear, initial encounter: Secondary | ICD-10-CM | POA: Diagnosis not present

## 2022-04-21 DIAGNOSIS — Z86718 Personal history of other venous thrombosis and embolism: Secondary | ICD-10-CM | POA: Diagnosis not present

## 2022-04-21 DIAGNOSIS — Z79899 Other long term (current) drug therapy: Secondary | ICD-10-CM | POA: Diagnosis not present

## 2022-04-21 DIAGNOSIS — Z7901 Long term (current) use of anticoagulants: Secondary | ICD-10-CM | POA: Diagnosis not present

## 2022-04-21 DIAGNOSIS — Y998 Other external cause status: Secondary | ICD-10-CM | POA: Diagnosis not present

## 2022-04-21 DIAGNOSIS — H9222 Otorrhagia, left ear: Secondary | ICD-10-CM | POA: Diagnosis not present

## 2022-05-02 ENCOUNTER — Ambulatory Visit: Payer: Medicare Other | Admitting: Neurology

## 2022-05-19 DIAGNOSIS — I69354 Hemiplegia and hemiparesis following cerebral infarction affecting left non-dominant side: Secondary | ICD-10-CM | POA: Diagnosis not present

## 2022-05-25 ENCOUNTER — Other Ambulatory Visit: Payer: Self-pay | Admitting: Legal Medicine

## 2022-05-25 DIAGNOSIS — I2699 Other pulmonary embolism without acute cor pulmonale: Secondary | ICD-10-CM

## 2022-05-31 DIAGNOSIS — J209 Acute bronchitis, unspecified: Secondary | ICD-10-CM | POA: Diagnosis not present

## 2022-06-01 ENCOUNTER — Other Ambulatory Visit: Payer: Medicare Other

## 2022-06-01 DIAGNOSIS — R5383 Other fatigue: Secondary | ICD-10-CM | POA: Diagnosis not present

## 2022-06-01 DIAGNOSIS — R2681 Unsteadiness on feet: Secondary | ICD-10-CM | POA: Diagnosis not present

## 2022-06-01 DIAGNOSIS — Z5181 Encounter for therapeutic drug level monitoring: Secondary | ICD-10-CM

## 2022-06-01 DIAGNOSIS — D6861 Antiphospholipid syndrome: Secondary | ICD-10-CM | POA: Diagnosis not present

## 2022-06-01 DIAGNOSIS — R278 Other lack of coordination: Secondary | ICD-10-CM | POA: Diagnosis not present

## 2022-06-01 DIAGNOSIS — M6281 Muscle weakness (generalized): Secondary | ICD-10-CM | POA: Diagnosis not present

## 2022-06-01 DIAGNOSIS — R208 Other disturbances of skin sensation: Secondary | ICD-10-CM | POA: Diagnosis not present

## 2022-06-01 DIAGNOSIS — M25672 Stiffness of left ankle, not elsewhere classified: Secondary | ICD-10-CM | POA: Diagnosis not present

## 2022-06-01 DIAGNOSIS — I69354 Hemiplegia and hemiparesis following cerebral infarction affecting left non-dominant side: Secondary | ICD-10-CM | POA: Diagnosis not present

## 2022-06-02 ENCOUNTER — Ambulatory Visit (INDEPENDENT_AMBULATORY_CARE_PROVIDER_SITE_OTHER): Payer: Medicare Other | Admitting: Neurology

## 2022-06-02 ENCOUNTER — Encounter: Payer: Self-pay | Admitting: Neurology

## 2022-06-02 VITALS — BP 121/75 | HR 96 | Ht 63.0 in | Wt 188.0 lb

## 2022-06-02 DIAGNOSIS — G43109 Migraine with aura, not intractable, without status migrainosus: Secondary | ICD-10-CM | POA: Diagnosis not present

## 2022-06-02 DIAGNOSIS — G8114 Spastic hemiplegia affecting left nondominant side: Secondary | ICD-10-CM

## 2022-06-02 DIAGNOSIS — G40009 Localization-related (focal) (partial) idiopathic epilepsy and epileptic syndromes with seizures of localized onset, not intractable, without status epilepticus: Secondary | ICD-10-CM

## 2022-06-02 DIAGNOSIS — I69354 Hemiplegia and hemiparesis following cerebral infarction affecting left non-dominant side: Secondary | ICD-10-CM

## 2022-06-02 LAB — PROTIME-INR
INR: 3.8 — ABNORMAL HIGH (ref 0.9–1.2)
Prothrombin Time: 37.6 s — ABNORMAL HIGH (ref 9.1–12.0)

## 2022-06-02 MED ORDER — GABAPENTIN 300 MG PO CAPS: 300.0000 mg | ORAL_CAPSULE | Freq: Every day | ORAL | 0 refills | Status: DC

## 2022-06-02 NOTE — Patient Instructions (Signed)
I had a long discussion with the patient regarding her remote right MCA infarct, migraine headaches and seizure disorder and answered questions.  She has had a little breakthrough migraines in the last 1 year but they seem to be improving and are not frequent and severe enough at the present time to justify prophylactic medications.  I recommend she avoid migraine triggers and maintain regular eating and sleeping habits.  If migraines become more frequent or disabling may consider medications at that time.  Keppra and the current dose of 750 mg twice daily for seizure prophylaxis and she has not had a seizure since 2020.  Reduce gabapentin dose to 300 mg once a day for a month and discontinue as I did not see any indication for it.  Continue Coumadin for stroke prevention with target INR between 2 and 3 for her antiphospholipid antibody syndrome.  Maintain aggressive risk factor modification with strict control of hypertension with blood pressure goal below 130/90, lipids with LDL cholesterol goal below 70 mg percent and diabetes with hemoglobin A1c goal below 6.5%.  She may return for follow-up in the future only as needed.     Migraine Headache A migraine headache is an intense, throbbing pain on one side or both sides of the head. Migraine headaches may also cause other symptoms, such as nausea, vomiting, and sensitivity to light and noise. A migraine headache can last from 4 hours to 3 days. Talk with your doctor about what things may bring on (trigger) your migraine headaches. What are the causes? The exact cause of this condition is not known. However, a migraine may be caused when nerves in the brain become irritated and release chemicals that cause inflammation of blood vessels. This inflammation causes pain. This condition may be triggered or caused by: Drinking alcohol. Smoking. Taking medicines, such as: Medicine used to treat chest pain (nitroglycerin). Birth control pills. Estrogen. Certain  blood pressure medicines. Eating or drinking products that contain nitrates, glutamate, aspartame, or tyramine. Aged cheeses, chocolate, or caffeine may also be triggers. Doing physical activity. Other things that may trigger a migraine headache include: Menstruation. Pregnancy. Hunger. Stress. Lack of sleep or too much sleep. Weather changes. Fatigue. What increases the risk? The following factors may make you more likely to experience migraine headaches: Being a certain age. This condition is more common in people who are 38-88 years old. Being female. Having a family history of migraine headaches. Being Caucasian. Having a mental health condition, such as depression or anxiety. Being obese. What are the signs or symptoms? The main symptom of this condition is pulsating or throbbing pain. This pain may: Happen in any area of the head, such as on one side or both sides. Interfere with daily activities. Get worse with physical activity. Get worse with exposure to bright lights or loud noises. Other symptoms may include: Nausea. Vomiting. Dizziness. General sensitivity to bright lights, loud noises, or smells. Before you get a migraine headache, you may get warning signs (an aura). An aura may include: Seeing flashing lights or having blind spots. Seeing bright spots, halos, or zigzag lines. Having tunnel vision or blurred vision. Having numbness or a tingling feeling. Having trouble talking. Having muscle weakness. Some people have symptoms after a migraine headache (postdromal phase), such as: Feeling tired. Difficulty concentrating. How is this diagnosed? A migraine headache can be diagnosed based on: Your symptoms. A physical exam. Tests, such as: CT scan or an MRI of the head. These imaging tests can help rule  out other causes of headaches. Taking fluid from the spine (lumbar puncture) and analyzing it (cerebrospinal fluid analysis, or CSF analysis). How is this  treated? This condition may be treated with medicines that: Relieve pain. Relieve nausea. Prevent migraine headaches. Treatment for this condition may also include: Acupuncture. Lifestyle changes like avoiding foods that trigger migraine headaches. Biofeedback. Cognitive behavioral therapy. Follow these instructions at home: Medicines Take over-the-counter and prescription medicines only as told by your health care provider. Ask your health care provider if the medicine prescribed to you: Requires you to avoid driving or using heavy machinery. Can cause constipation. You may need to take these actions to prevent or treat constipation: Drink enough fluid to keep your urine pale yellow. Take over-the-counter or prescription medicines. Eat foods that are high in fiber, such as beans, whole grains, and fresh fruits and vegetables. Limit foods that are high in fat and processed sugars, such as fried or sweet foods. Lifestyle Do not drink alcohol. Do not use any products that contain nicotine or tobacco, such as cigarettes, e-cigarettes, and chewing tobacco. If you need help quitting, ask your health care provider. Get at least 8 hours of sleep every night. Find ways to manage stress, such as meditation, deep breathing, or yoga. General instructions Keep a journal to find out what may trigger your migraine headaches. For example, write down: What you eat and drink. How much sleep you get. Any change to your diet or medicines. If you have a migraine headache: Avoid things that make your symptoms worse, such as bright lights. It may help to lie down in a dark, quiet room. Do not drive or use heavy machinery. Ask your health care provider what activities are safe for you while you are experiencing symptoms. Keep all follow-up visits as told by your health care provider. This is important. Contact a health care provider if: You develop symptoms that are different or more severe than your  usual migraine headache symptoms. You have more than 15 headache days in one month. Get help right away if: Your migraine headache becomes severe. Your migraine headache lasts longer than 72 hours. You have a fever. You have a stiff neck. You have vision loss. Your muscles feel weak or like you cannot control them. You start to lose your balance often. You have trouble walking. You faint. You have a seizure. Summary A migraine headache is an intense, throbbing pain on one side or both sides of the head. Migraines may also cause other symptoms, such as nausea, vomiting, and sensitivity to light and noise. This condition may be treated with medicines and lifestyle changes. You may also need to avoid certain things that trigger a migraine headache. Keep a journal to find out what may trigger your migraine headaches. Contact your health care provider if you have more than 15 headache days in a month or you develop symptoms that are different or more severe than your usual migraine headache symptoms. This information is not intended to replace advice given to you by your health care provider. Make sure you discuss any questions you have with your health care provider. Document Revised: 10/21/2021 Document Reviewed: 06/21/2018 Elsevier Patient Education  [place].

## 2022-06-02 NOTE — Progress Notes (Signed)
[facility] [address] [place]. [place] [address] [phone]       OFFICE CONSULT NOTE  Shannon. Shannon Erickson Date of Birth:  July 25, 1975 Medical Record Number:  [id number]   Referring MD: Rochel Brome  Reason for Referral: Stroke and migraine  HPI: Shannon Erickson is a 47 year old Caucasian lady seen today for office consultation visit for history of remote stroke, seizures and migraine.  History is obtained from the patient, review of electronic medical records and I have personally reviewed pertinent available imaging films in PACS.  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  Longstanding history of migraine headaches.  She was last seen in follow-up on 10/19/2027 by Dr. Erlinda Hong in the office.  He was doing well without recurrent stroke or TIA symptoms and no seizures since 2019 on Keppra 750 twice daily.  She is on warfarin for positive antipositive antibody syndrome.  He has done well without recurrent seizures or stroke has outgrown her migraines.  She is concerned as she started noticing significant migraine headaches for the last 1 year.  Headaches are brief off as a sharp pain in the occipital region and radiates up into the forehead.  This is often accompanied by seeing visual floaters.  These are quite brief and did not last for more than a few minutes.  Initially they were occurring couple times a week and she saw her primary care physician and requested this consult however in the last month or so they have become quite infrequent and not bothersome.  Remains on warfarin and her INR tends to fluctuate is regular without follow-up and strict with her diet. No recurrent TIA or stroke symptoms.  She continues to have significant left hemiparesis but is able to ambulate independently.  She is tolerating Keppra 750 mg twice  daily without side effects and has not had any seizures for the last 4 years.  She is also on gabapentin 300 mg twice daily but is not sure whether this is for spasticity or pain  and is willing to o consider to reduce it.  He has no new complaints today. Prior stroke 12/03/2016 ;She was found to have large right MCA infarct on MRI. MRA right M1 occlusion. CTA head and neck proximal right M1 occlusion, b/l ICA bifurcation soft plaques concerning for ulceration or intraplaque hemorrhage. TCD bubble study no PFO and emboli detection no MES. EF 60-65%. TEE unremarkable EF 50%. DVT negative. LDL 138 and A1C 6.2. Hypercoagulable work up showed beta-2-glycoprotein IgG 109, very high. She was put on DAPT and high dose lipitor. She was discharge to CIR with LUE 0/5 and LLE 2/  ROS:   14 system review of systems is positive for migraines, visual dysfunction, headache, difficulty walking, weakness, bruising and all other systems negative  PMH:  Past Medical History:  Diagnosis Date   Hypertension    Lesion of left ulnar nerve    Migraine    Seizures (HCC)    Stroke ([place])    Stroke due to occlusion of right middle cerebral artery ([place]) 12/10/2016    Social History:  Social History   Socioeconomic History   Marital status: Single    Spouse name: Not on file   Number of children: 3   Years of education: 12   Highest education level: Not on file  Occupational History   Not on file  Tobacco Use   Smoking status: Never   Smokeless tobacco: Never  Vaping Use   Vaping Use: Never used  Substance and Sexual Activity   Alcohol use: No   Drug use: No   Sexual activity: Not Currently    Birth control/protection: Other-see comments  Other Topics Concern   Not on file  Social History Narrative   Not on file   Social Determinants of Health   Financial Resource Strain: Low Risk  (08/11/2021)   Overall Financial Resource Strain (CARDIA)    Difficulty of Paying Living Expenses: Not hard at all  Food  Insecurity: No Food Insecurity (09/09/2020)   Hunger Vital Sign    Worried About Running Out of Food in the Last Year: Never true    Ran Out of Food in the Last Year: Never true  Transportation Needs: No Transportation Needs (08/11/2021)   PRAPARE - Hydrologist (Medical): No    Lack of Transportation (Non-Medical): No  Physical Activity: Not on file  Stress: Not on file  Social Connections: Not on file  Intimate Partner Violence: Not on file    Medications:   Current Outpatient Medications on File Prior to Visit  Medication Sig Dispense Refill   atorvastatin (LIPITOR) 80 MG tablet Take 1 tablet by mouth once daily 90 tablet 1   dapagliflozin propanediol (FARXIGA) 5 MG TABS tablet Take 1 tablet (5 mg total) by mouth daily before breakfast. 30 tablet 3   escitalopram (LEXAPRO) 10 MG tablet Take 1 tablet (10 mg total) by mouth daily. 30 tablet 3   gabapentin (NEURONTIN) 300 MG capsule Take 1 capsule by mouth twice daily 60 capsule 6   levETIRAcetam (KEPPRA) 750 MG tablet Take 1 tablet by mouth twice daily 60 tablet 6   nitroGLYCERIN (NITROSTAT) 0.4 MG SL tablet Place 1 tablet under the tongue every 5 (five) minutes as needed.  0   warfarin (COUMADIN) 1 MG tablet Take 1 tablet (1 mg total) by mouth daily. 30 tablet 1   warfarin (COUMADIN) 6 MG tablet Take 1 tablet (6 mg total) by mouth daily. 30 tablet 0   No current facility-administered medications on file prior to visit.    Allergies:   Allergies  Allergen Reactions   Penicillins Hives    Has patient had a PCN reaction causing immediate rash, facial/tongue/throat swelling, SOB or lightheadedness with hypotension: Yes Has patient had a PCN reaction causing severe rash involving mucus membranes or skin necrosis: Yes Has patient had a PCN reaction that required hospitalization: Unk Has patient had a PCN reaction occurring within the last 10 years: No If all of the above answers are "NO", then may proceed  with Cephalosporin use.     Physical Exam General: well developed, well nourished pleasant middle-aged Caucasian lady, seated, in no evident distress Head: head normocephalic and atraumatic.   Neck: supple with no carotid or supraclavicular bruits Cardiovascular: regular rate and rhythm, no murmurs Musculoskeletal: no deformity Skin:  no rash/petichiae Vascular:  Normal pulses all extremities  Neurologic Exam Mental Status: Awake and fully alert. Oriented to place and time. Recent and remote memory intact. Attention span, concentration and fund of knowledge appropriate. Mood and affect appropriate.  Cranial Nerves: Fundoscopic exam reveals sharp disc margins. Pupils equal, briskly reactive to light. Extraocular movements full without nystagmus. Visual fields full to confrontation. Hearing intact. Facial sensation intact. Face, tongue, palate moves normally and symmetrically.  Motor: Spastic left hemiplegia with left upper extremity 1/5 proximally and  0/5 distally.  Left lower extremity strength 4/5 proximally with left ankle foot drop and 2+ distally.  Spasticity on the left.  Normal strength and tone on the right. Sensory.:  Manage left upper touch , pinprick , position and vibratory sensation.  Coordination: Rapid alternating movements normal in all extremities. Finger-to-nose and heel-to-shin performed accurately bilaterally. Gait and Station: Arises from chair without difficulty. Stance is normal.  Spastic hemiplegic gait with left foot drop and circumduction  reflexes: 2+ and asymmetric and brisker on the left. Toes downgoing.   NIHSS  4 Modified [place]  3   ASSESSMENT: 47 year old lady with remote right middle cerebral artery infarct 2018 with residual spastic left hemiplegia prior history of migraines with some recent increase in his appears to be subsiding.  Also history of symptomatic seizures which appear well-controlled on current medication regimen of Keppra.  She is doing well  without recurrent strokes or breakthrough seizures in the last 5 years.  Transient flurry of migraines in the last 1 year which also appears to be improving.    PLAN: I had a long discussion with the patient regarding her remote right MCA infarct, migraine headaches and seizure disorder and answered questions.  She has had a little breakthrough migraines in the last 1 year but they seem to be improving and are not frequent and severe enough at the present time to justify prophylactic medications.  I recommend she avoid migraine triggers and maintain regular eating and sleeping habits.  If migraines become more frequent or disabling may consider medications at that time.  Keppra and the current dose of 750 mg twice daily for seizure prophylaxis and she has not had a seizure since 2020.  Reduce gabapentin dose to 300 mg once a day for a month and discontinue as I did not see any indication for it.  Continue Coumadin for stroke prevention with target INR between 2 and 3 for her antiphospholipid antibody syndrome.  Maintain aggressive risk factor modification with strict control of hypertension with blood pressure goal below 130/90, lipids with LDL cholesterol goal below 70 mg percent and diabetes with hemoglobin A1c goal below 6.5%.  She may return for follow-up in the future only as needed.   Greater than 50% time during this 45-minute consultation visit with spent in counseling and coordination of care about her remote stroke, spastic hemiplegia, migraine, seizures and answering questions Delia Heady, MD  Note: This document was prepared with digital dictation and possible smart phrase technology. Any transcriptional errors that result from this process are unintentional.

## 2022-06-07 DIAGNOSIS — M25672 Stiffness of left ankle, not elsewhere classified: Secondary | ICD-10-CM | POA: Diagnosis not present

## 2022-06-07 DIAGNOSIS — M6281 Muscle weakness (generalized): Secondary | ICD-10-CM | POA: Diagnosis not present

## 2022-06-07 DIAGNOSIS — I69354 Hemiplegia and hemiparesis following cerebral infarction affecting left non-dominant side: Secondary | ICD-10-CM | POA: Diagnosis not present

## 2022-06-07 DIAGNOSIS — R278 Other lack of coordination: Secondary | ICD-10-CM | POA: Diagnosis not present

## 2022-06-07 DIAGNOSIS — R208 Other disturbances of skin sensation: Secondary | ICD-10-CM | POA: Diagnosis not present

## 2022-06-07 DIAGNOSIS — R2681 Unsteadiness on feet: Secondary | ICD-10-CM | POA: Diagnosis not present

## 2022-06-07 DIAGNOSIS — R5383 Other fatigue: Secondary | ICD-10-CM | POA: Diagnosis not present

## 2022-06-08 ENCOUNTER — Other Ambulatory Visit: Payer: Medicare Other

## 2022-06-08 ENCOUNTER — Other Ambulatory Visit: Payer: Self-pay

## 2022-06-08 DIAGNOSIS — D6861 Antiphospholipid syndrome: Secondary | ICD-10-CM

## 2022-06-09 LAB — PROTIME-INR
INR: 3.6 — ABNORMAL HIGH (ref 0.9–1.2)
Prothrombin Time: 36 s — ABNORMAL HIGH (ref 9.1–12.0)

## 2022-06-16 ENCOUNTER — Other Ambulatory Visit: Payer: Self-pay

## 2022-06-16 DIAGNOSIS — R2681 Unsteadiness on feet: Secondary | ICD-10-CM | POA: Diagnosis not present

## 2022-06-16 DIAGNOSIS — M25672 Stiffness of left ankle, not elsewhere classified: Secondary | ICD-10-CM | POA: Diagnosis not present

## 2022-06-16 DIAGNOSIS — I69354 Hemiplegia and hemiparesis following cerebral infarction affecting left non-dominant side: Secondary | ICD-10-CM | POA: Diagnosis not present

## 2022-06-16 DIAGNOSIS — R278 Other lack of coordination: Secondary | ICD-10-CM | POA: Diagnosis not present

## 2022-06-16 DIAGNOSIS — M6281 Muscle weakness (generalized): Secondary | ICD-10-CM | POA: Diagnosis not present

## 2022-06-16 DIAGNOSIS — Z5181 Encounter for therapeutic drug level monitoring: Secondary | ICD-10-CM

## 2022-06-16 DIAGNOSIS — R208 Other disturbances of skin sensation: Secondary | ICD-10-CM | POA: Diagnosis not present

## 2022-06-16 DIAGNOSIS — R5383 Other fatigue: Secondary | ICD-10-CM | POA: Diagnosis not present

## 2022-06-20 ENCOUNTER — Other Ambulatory Visit: Payer: Self-pay | Admitting: Family Medicine

## 2022-06-20 DIAGNOSIS — G40909 Epilepsy, unspecified, not intractable, without status epilepticus: Secondary | ICD-10-CM

## 2022-06-22 ENCOUNTER — Other Ambulatory Visit: Payer: Medicare Other

## 2022-06-22 DIAGNOSIS — Z5181 Encounter for therapeutic drug level monitoring: Secondary | ICD-10-CM | POA: Diagnosis not present

## 2022-06-22 DIAGNOSIS — Z7901 Long term (current) use of anticoagulants: Secondary | ICD-10-CM | POA: Diagnosis not present

## 2022-06-23 LAB — PROTIME-INR
INR: 1.1 (ref 0.9–1.2)
Prothrombin Time: 12.3 s — ABNORMAL HIGH (ref 9.1–12.0)

## 2022-06-24 ENCOUNTER — Other Ambulatory Visit: Payer: Self-pay

## 2022-06-24 DIAGNOSIS — D6861 Antiphospholipid syndrome: Secondary | ICD-10-CM

## 2022-06-25 ENCOUNTER — Other Ambulatory Visit: Payer: Self-pay | Admitting: Family Medicine

## 2022-06-25 DIAGNOSIS — I2699 Other pulmonary embolism without acute cor pulmonale: Secondary | ICD-10-CM

## 2022-06-27 ENCOUNTER — Encounter: Payer: Self-pay | Admitting: Family Medicine

## 2022-06-29 ENCOUNTER — Other Ambulatory Visit: Payer: No Typology Code available for payment source

## 2022-06-29 DIAGNOSIS — I69354 Hemiplegia and hemiparesis following cerebral infarction affecting left non-dominant side: Secondary | ICD-10-CM | POA: Diagnosis not present

## 2022-06-29 DIAGNOSIS — Z736 Limitation of activities due to disability: Secondary | ICD-10-CM | POA: Diagnosis not present

## 2022-06-29 DIAGNOSIS — D6861 Antiphospholipid syndrome: Secondary | ICD-10-CM | POA: Diagnosis not present

## 2022-06-30 ENCOUNTER — Other Ambulatory Visit: Payer: Medicare Other

## 2022-06-30 LAB — PROTIME-INR
INR: 1.3 — ABNORMAL HIGH (ref 0.9–1.2)
Prothrombin Time: 13.4 s — ABNORMAL HIGH (ref 9.1–12.0)

## 2022-07-01 ENCOUNTER — Telehealth: Payer: Self-pay | Admitting: Neurology

## 2022-07-01 ENCOUNTER — Other Ambulatory Visit: Payer: Self-pay | Admitting: Family Medicine

## 2022-07-01 ENCOUNTER — Ambulatory Visit: Payer: Medicare Other | Admitting: Family Medicine

## 2022-07-01 MED ORDER — RIVAROXABAN 20 MG PO TABS: 20.0000 mg | ORAL_TABLET | Freq: Every day | ORAL | 2 refills | Status: DC

## 2022-07-01 NOTE — Telephone Encounter (Signed)
Dr Nolon Nations has called asking the request be forwarded that Dr Leonie Man gives her a call to discuss the care of the pt, she can be reached at [phone]

## 2022-07-06 ENCOUNTER — Other Ambulatory Visit: Payer: Self-pay | Admitting: Family Medicine

## 2022-07-06 DIAGNOSIS — E782 Mixed hyperlipidemia: Secondary | ICD-10-CM

## 2022-07-06 DIAGNOSIS — E1159 Type 2 diabetes mellitus with other circulatory complications: Secondary | ICD-10-CM

## 2022-07-06 DIAGNOSIS — I69354 Hemiplegia and hemiparesis following cerebral infarction affecting left non-dominant side: Secondary | ICD-10-CM

## 2022-07-07 ENCOUNTER — Other Ambulatory Visit: Payer: Self-pay

## 2022-07-07 DIAGNOSIS — D6861 Antiphospholipid syndrome: Secondary | ICD-10-CM

## 2022-07-07 MED ORDER — APIXABAN 5 MG PO TABS: 5.0000 mg | ORAL_TABLET | Freq: Two times a day (BID) | ORAL | 0 refills | Status: DC

## 2022-07-08 ENCOUNTER — Telehealth: Payer: Self-pay

## 2022-07-08 ENCOUNTER — Other Ambulatory Visit: Payer: No Typology Code available for payment source

## 2022-07-08 DIAGNOSIS — D6861 Antiphospholipid syndrome: Secondary | ICD-10-CM

## 2022-07-08 NOTE — Progress Notes (Signed)
   Care Guide Note  07/08/2022 Name: Shannon Erickson MRN: [id number] DOB: 02-13-1976  Referred by: Rochel Brome, MD Reason for referral : Care Coordination (Outreach to schedule with pharm d )   Shannon Erickson is a 47 y.o. year old female who is a primary care patient of Cox, Kirsten, MD. Shannon Erickson was referred to the pharmacist for assistance related to HTN.    Successful contact was made with the patient to discuss pharmacy services including being ready for the pharmacist to call at least 5 minutes before the scheduled appointment time, to have medication bottles and any blood sugar or blood pressure readings ready for review. The patient agreed to meet with the pharmacist via with the pharmacist via telephone visit on (date/time).  07/12/2022  Noreene Larsson, [place], [place] [address] Direct Dial: [phone] [email or web address]

## 2022-07-09 LAB — PROTIME-INR
INR: 2.5 — ABNORMAL HIGH (ref 0.9–1.2)
Prothrombin Time: 25.2 s — ABNORMAL HIGH (ref 9.1–12.0)

## 2022-07-12 ENCOUNTER — Other Ambulatory Visit: Payer: No Typology Code available for payment source | Admitting: Pharmacist

## 2022-07-12 NOTE — Progress Notes (Signed)
07/12/2022 Name: Shannon Erickson MRN: [id number] DOB: May 25, 1975  Chief Complaint  Patient presents with   Medication Management    Shannon Erickson is a 47 y.o. year old female who presented for a telephone visit.   They were referred to the pharmacist by their PCP for assistance in managing medication access.   Subjective:  Care Team: Primary Care Provider: Rochel Brome, MD ; Next Scheduled Visit: 08/03/22  Medication Access/Adherence  Current Pharmacy:  [facility] [address], [place] [id number] [place] [id number] Phone: [phone] Fax: [phone]   Patient reports affordability concerns with their medications: No  Patient reports access/transportation concerns to their pharmacy: No  Patient reports adherence concerns with their medications:  No    Stroke Prevention:  Reports that Xarelto/Eliquis were both covered by her insurance, copay was $11. She can afford this, she just doesn't have the money until she receives her disability check next month.   History of APS, ischemic stroke;  lab work in 2018 demonstrated elevated anticardiolipin and beta-2  glycoprotein. Major clinical guidelines (ES, ASH, EULAR, ISTH) all recommend that patients preferentially use warfarin and that DOAC is unlikely to be an appropriate option. Patient denies having seen hematologist previously.   Current medications: warfarin Tues/Thur 3 mg M/W/F/Sat/Sun: warfarin 6 mg (total weekly dose 36 mg); most recent INR 2.5  Takes in the evening around 7 pm; denies missed doses.   Breakfast: apple cinnamon oatmeal, cap'n crunch cereal (alternates between these two options) Lunch: chicken noodle soup with white rice; lots of chicken and vegetables;  Supper: Potato tacos; pinto beans, mashed potatoes, green beans; corn; steamed carrots or steamed zucchini    Likes cesar salad   Denies eating greens, broccoli, brussel sprouts; liver  mush  Hyperlipidemia/ASCVD Risk Reduction  Current lipid lowering medications: atorvastatin 80 mg daily  Diabetes:  Current medications: Farxiga 5 mg daily   Neuropathy: Current medications:  gabapentin 300 mg every morning;   Depression/Anxiety:  Current medications: escitalopram 10 mg daily  History of Seizure Disorder:  Current medications: levetiracetam 750 mg twice daily   Objective:  Lab Results  Component Value Date   HGBA1C 6.5 (H) 03/29/2022    Lab Results  Component Value Date   CREATININE 0.92 04/01/2022   BUN 10 04/01/2022   NA 139 04/01/2022   K 4.2 04/01/2022   CL 106 04/01/2022   CO2 20 04/01/2022    Lab Results  Component Value Date   CHOL 136 03/29/2022   HDL 54 03/29/2022   LDLCALC 66 03/29/2022   TRIG 83 03/29/2022   CHOLHDL 2.5 03/29/2022    Medications Reviewed Today     Reviewed by Osker Mason, RPH-CPP (Pharmacist) on 07/12/22 at 1613  Med List Status: <None>   Medication Order Taking? Sig Documenting Provider Last Dose Status Informant  apixaban (ELIQUIS) 5 MG TABS tablet [id number] No Take 1 tablet (5 mg total) by mouth 2 (two) times daily.  Patient not taking: Reported on 07/12/2022   Rochel Brome, MD Not Taking Active   atorvastatin (LIPITOR) 80 MG tablet [id number] Yes Take 1 tablet by mouth once daily Cox, Kirsten, MD Taking Active   dapagliflozin propanediol (FARXIGA) 5 MG TABS tablet [id number] Yes Take 1 tablet (5 mg total) by mouth daily before breakfast. Lillard Anes, MD Taking Active   escitalopram (LEXAPRO) 10 MG tablet [id number] Yes Take 1 tablet (10 mg total) by mouth daily. Lillard Anes, MD Taking Active  gabapentin (NEURONTIN) 300 MG capsule [id number] Yes Take 1 capsule (300 mg total) by mouth daily. Decrease gabapentin dose to once daily x  30 days and stop Garvin Fila, MD Taking Active   levETIRAcetam (KEPPRA) 750 MG tablet [id number] Yes Take 1 tablet by mouth twice daily Cox, Kirsten, MD  Taking Active   nitroGLYCERIN (NITROSTAT) 0.4 MG SL tablet [id number]  Place 1 tablet under the tongue every 5 (five) minutes as needed. [provider]  Active   rivaroxaban (XARELTO) 20 MG TABS tablet [id number] No Take 1 tablet (20 mg total) by mouth daily with supper.  Patient not taking: Reported on 07/12/2022   Rochel Brome, MD Not Taking Active               Assessment/Plan:   Stroke Prevention:  - History of labile INR. Most recent was therapeutic. Currently guidelines recommend against use of DOACs in patients with APS with an arterial thrombotic event with any positive APS labs. Would recommend maintenance of warfarin therapy with INR recheck in 2 weeks. Discussed referral to hematology for discussion. Will discuss with PCP.   Hyperlipidemia/ASCVD Risk Reduction - Controlled - Continue current regimen  Diabetes: - Controlled - Continue current regimen  Neuropathy: - Controlled - Continue current regimen  Depression/Anxiety: - Controlled - Continue current regimen  History of Seizure Disorder:  - Controlled - Continue current regimen  Catie Hedwig Morton, PharmD, BCACP, [facility] [phone]

## 2022-07-14 ENCOUNTER — Other Ambulatory Visit: Payer: Self-pay

## 2022-07-14 DIAGNOSIS — E1159 Type 2 diabetes mellitus with other circulatory complications: Secondary | ICD-10-CM

## 2022-07-16 ENCOUNTER — Other Ambulatory Visit: Payer: Self-pay | Admitting: Neurology

## 2022-07-16 DIAGNOSIS — I69354 Hemiplegia and hemiparesis following cerebral infarction affecting left non-dominant side: Secondary | ICD-10-CM

## 2022-07-17 ENCOUNTER — Other Ambulatory Visit: Payer: Self-pay

## 2022-07-17 DIAGNOSIS — E1159 Type 2 diabetes mellitus with other circulatory complications: Secondary | ICD-10-CM

## 2022-07-17 MED ORDER — DAPAGLIFLOZIN PROPANEDIOL 5 MG PO TABS: 5.0000 mg | ORAL_TABLET | Freq: Every day | ORAL | 0 refills | Status: DC

## 2022-07-20 ENCOUNTER — Other Ambulatory Visit: Payer: Self-pay | Admitting: Pharmacist

## 2022-07-20 NOTE — Progress Notes (Signed)
Care Coordination Call  Discussed with PCP, preference for patient to switch to [place] therapy due to non-compliance with INR checks. PCP and Neurology discussed and previously agreed that [place] would be acceptable. Recommend Eliquis over Xarelto. Given prior INR of 2.5, recommend to hold 1 dose of warfarin and start Eliquis the following day.   Contacted patient to discuss. She confirms she would be able to afford the $11 copay (30 or 90 days) for Eliquis, however, she is incredibly uncomfortable with the idea of changing to an anticoagulant that does not have any monitoring. Worries that she will have another stroke. Discussed differences in pharmacokinetics and absorption and that DOACs are more stable in their patient to patient impact, no dietary and less drug interactions, but patient still prefers to discuss with PCP in 2 weeks at upcoming appointment. She also reiterates that she would like a referral to hematology to discuss. Encouraged to discuss with PCP.   Catie Hedwig Morton, PharmD, [place], [facility] [phone]

## 2022-07-21 ENCOUNTER — Other Ambulatory Visit: Payer: Self-pay | Admitting: Family Medicine

## 2022-07-21 ENCOUNTER — Other Ambulatory Visit: Payer: Self-pay | Admitting: Neurology

## 2022-07-21 ENCOUNTER — Encounter: Payer: Self-pay | Admitting: Family Medicine

## 2022-07-21 DIAGNOSIS — D6861 Antiphospholipid syndrome: Secondary | ICD-10-CM

## 2022-07-21 DIAGNOSIS — I69354 Hemiplegia and hemiparesis following cerebral infarction affecting left non-dominant side: Secondary | ICD-10-CM

## 2022-07-21 DIAGNOSIS — I63511 Cerebral infarction due to unspecified occlusion or stenosis of right middle cerebral artery: Secondary | ICD-10-CM

## 2022-07-26 DIAGNOSIS — R278 Other lack of coordination: Secondary | ICD-10-CM | POA: Diagnosis not present

## 2022-07-26 DIAGNOSIS — M25672 Stiffness of left ankle, not elsewhere classified: Secondary | ICD-10-CM | POA: Diagnosis not present

## 2022-07-26 DIAGNOSIS — R5383 Other fatigue: Secondary | ICD-10-CM | POA: Diagnosis not present

## 2022-07-26 DIAGNOSIS — I69354 Hemiplegia and hemiparesis following cerebral infarction affecting left non-dominant side: Secondary | ICD-10-CM | POA: Diagnosis not present

## 2022-07-26 DIAGNOSIS — M6281 Muscle weakness (generalized): Secondary | ICD-10-CM | POA: Diagnosis not present

## 2022-07-26 DIAGNOSIS — R2681 Unsteadiness on feet: Secondary | ICD-10-CM | POA: Diagnosis not present

## 2022-07-26 DIAGNOSIS — R208 Other disturbances of skin sensation: Secondary | ICD-10-CM | POA: Diagnosis not present

## 2022-07-27 ENCOUNTER — Other Ambulatory Visit: Payer: Self-pay | Admitting: Oncology

## 2022-07-27 ENCOUNTER — Inpatient Hospital Stay: Payer: No Typology Code available for payment source

## 2022-07-27 ENCOUNTER — Inpatient Hospital Stay: Payer: No Typology Code available for payment source | Attending: Oncology | Admitting: Oncology

## 2022-07-27 VITALS — BP 118/56 | HR 79 | Temp 98.3°F | Resp 14 | Ht 62.0 in | Wt 192.2 lb

## 2022-07-27 DIAGNOSIS — Z79899 Other long term (current) drug therapy: Secondary | ICD-10-CM | POA: Insufficient documentation

## 2022-07-27 DIAGNOSIS — R7401 Elevation of levels of liver transaminase levels: Secondary | ICD-10-CM | POA: Insufficient documentation

## 2022-07-27 DIAGNOSIS — I639 Cerebral infarction, unspecified: Secondary | ICD-10-CM

## 2022-07-27 DIAGNOSIS — Z8051 Family history of malignant neoplasm of kidney: Secondary | ICD-10-CM | POA: Insufficient documentation

## 2022-07-27 DIAGNOSIS — Z8673 Personal history of transient ischemic attack (TIA), and cerebral infarction without residual deficits: Secondary | ICD-10-CM | POA: Insufficient documentation

## 2022-07-27 DIAGNOSIS — Z801 Family history of malignant neoplasm of trachea, bronchus and lung: Secondary | ICD-10-CM

## 2022-07-27 DIAGNOSIS — D6861 Antiphospholipid syndrome: Secondary | ICD-10-CM | POA: Diagnosis not present

## 2022-07-27 DIAGNOSIS — Z7901 Long term (current) use of anticoagulants: Secondary | ICD-10-CM | POA: Insufficient documentation

## 2022-07-27 MED ORDER — WARFARIN SODIUM 3 MG PO TABS: 6.0000 mg | ORAL_TABLET | Freq: Every day | ORAL | 3 refills | Status: DC

## 2022-07-27 NOTE — Progress Notes (Signed)
[facility] Initial Visit:  Patient Care Team: Cox, Elnita Maxwell, MD as PCP - General (Family Medicine)  CHIEF COMPLAINTS/PURPOSE OF CONSULTATION:  HISTORY OF PRESENTING ILLNESS: Shannon Erickson 47 y.o. 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 Migraines  December 03 2016: Presented to [facility] health emergency department with right sided headache.   CT head without contrast negative for hemorrhage MRI head without contrast negative for hemorrhage but suspected right MCA M1 stenosis or occlusion CT angiogram showed proximal right M1 occlusion.  Evolution of right temporal lobe, insular cortex and right basal ganglia nonhemorrhagic infarction.  Focal irregularity right carotid bifurcation but no significant stenosis.  Fetal type posterior cerebral artery bilaterally TEE negative for PFO or ASO or endocarditis. Lower extremity Dopplers negative for DVT  WBC 7.4 hemoglobin 13.3 platelet count 259 high: 45 segs 42 lymphs 10 monos 2 eos 1 basophil INR 0.94 PTT 29 AT3 113 protein C activity 191 protein S activity 91 Lupus anticoagulant negative Beta-2 glycoprotein IgG 109.  anticardiolipin notable for IgM 13 (borderline) Factor V Leiden gene mutation study negative prothrombin gene mutation study negative ANA and Antidouble-stranded DNA negative RPR and HIV negative Hemoglobin A1c 6.2 Lipid panel notable for LDL of 138 (borderline) Alpha galactosidase 13.2 (low) (Raised the quesiton of Fabry disease) CMP notable for glucose of 138 albumin 2.8  Stroke was thought to be embolic in nature patient was placed on aspirin 325 mg daily Plavix and a statin  July 27 2022:  [facility] Consult  Patient states that her son age 51 was diagnosed with an antiphospholipid Ab at age 72 when he presented with PE.  None  of her other children have an antiphospholipid antibody.  She was not on OCP's when the CVA occurred and was physically active.   She has been maintained on Coumadin since 2018 and was recently changed to Eliquis.  She has never been on ASA or plavix.  Coumadin dose was 6 to 7 mg daily with INR's ranging from 1.1 to 3.8.  No bleeding problems with coumadin.  Has been on Lipitor since 2018.  Wilder Glade was started 4 months ago.  Transaminases became elevated but have not been rechecked  Social history:  Disabled.  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  Tobacco none.  EtOH none.     Review of Systems  Constitutional:  Negative for appetite change, chills, fatigue, fever and unexpected weight change.  HENT:   Negative for hearing loss, lump/mass, mouth sores, nosebleeds, sore throat, tinnitus, trouble swallowing and voice change.   Eyes:  Negative for eye problems and icterus.       Vision changes:  None  Respiratory:  Negative for chest tightness, cough, hemoptysis, shortness of breath and wheezing.        PND:  none Orthopnea:  none DOE:    Cardiovascular:  Negative for chest pain, leg swelling and palpitations.       PND:  none Orthopnea:  none  Gastrointestinal:  Negative for abdominal pain, blood in stool, constipation, diarrhea, nausea, rectal pain and vomiting.  Endocrine: Negative for hot flashes.       Cold intolerance:  none Heat intolerance:  none  Genitourinary:  Negative for bladder incontinence, difficulty urinating, dysuria, frequency, hematuria and nocturia.   Musculoskeletal:  Positive for gait problem. Negative for arthralgias, back pain, myalgias, neck pain and neck  stiffness.  Skin:  Negative for itching, rash and wound.  Neurological:  Positive for extremity weakness and gait problem. Negative for dizziness, headaches, light-headedness, numbness, seizures and speech difficulty.  Hematological:  Negative for adenopathy. Does not bruise/bleed easily.  Psychiatric/Behavioral:   Negative for sleep disturbance and suicidal ideas. The patient is not nervous/anxious.     MEDICAL HISTORY: Past Medical History:  Diagnosis Date   Hypertension    Lesion of left ulnar nerve    Migraine    Seizures ([place])    Stroke [facility])    Stroke due to occlusion of right middle cerebral artery ([place]) 12/10/2016    SURGICAL HISTORY: Past Surgical History:  Procedure Laterality Date   CESAREAN SECTION Bilateral 1995, 2002   CHOLECYSTECTOMY     LYMPH NODE BIOPSY     MOUTH SURGERY Left 07/04/2021   NO PAST SURGERIES     TEE WITHOUT CARDIOVERSION N/A 12/05/2016   Procedure: TRANSESOPHAGEAL ECHOCARDIOGRAM (TEE);  Surgeon: Acie Fredrickson, Wonda Cheng, MD;  Location: [facility] ENDOSCOPY;  Service: Cardiovascular;  Laterality: N/A;    SOCIAL HISTORY: Social History   Socioeconomic History   Marital status: Single    Spouse name: Not on file   Number of children: 3   Years of education: 12   Highest education level: Not on file  Occupational History   Not on file  Tobacco Use   Smoking status: Never   Smokeless tobacco: Never  Vaping Use   Vaping Use: Never used  Substance and Sexual Activity   Alcohol use: No   Drug use: No   Sexual activity: Not Currently    Birth control/protection: Other-see comments  Other Topics Concern   Not on file  Social History Narrative   Not on file   Social Determinants of Health   Financial Resource Strain: Low Risk  (08/11/2021)   Overall Financial Resource Strain (CARDIA)    Difficulty of Paying Living Expenses: Not hard at all  Food Insecurity: No Food Insecurity (07/27/2022)   Hunger Vital Sign    Worried About Running Out of Food in the Last Year: Never true    Ran Out of Food in the Last Year: Never true  Transportation Needs: No Transportation Needs (08/11/2021)   PRAPARE - Hydrologist (Medical): No    Lack of Transportation (Non-Medical): No  Physical Activity: Not on file  Stress: Not on file  Social Connections:  Not on file  Intimate Partner Violence: Not At Risk (07/27/2022)   Humiliation, Afraid, Rape, and Kick questionnaire    Fear of Current or Ex-Partner: No    Emotionally Abused: No    Physically Abused: No    Sexually Abused: No    FAMILY HISTORY Family History  Problem Relation Age of Onset   Lung cancer Mother        mets to brain, bone, liver   Hypertension Father    Heart attack Brother        at age 62   Renal cancer Maternal Grandfather    Heart attack Paternal Grandfather     ALLERGIES:  is allergic to penicillins.  MEDICATIONS:  Current Outpatient Medications  Medication Sig Dispense Refill   apixaban (ELIQUIS) 5 MG TABS tablet Take 1 tablet (5 mg total) by mouth 2 (two) times daily. 60 tablet 0   atorvastatin (LIPITOR) 80 MG tablet Take 1 tablet by mouth once daily 90 tablet 1   dapagliflozin propanediol (FARXIGA) 5 MG TABS tablet Take 1 tablet (5  mg total) by mouth daily before breakfast. 100 tablet 0   escitalopram (LEXAPRO) 10 MG tablet Take 1 tablet (10 mg total) by mouth daily. 30 tablet 3   gabapentin (NEURONTIN) 300 MG capsule Take 1 capsule (300 mg total) by mouth daily. Decrease gabapentin dose to once daily x  30 days and stop 30 capsule 0   levETIRAcetam (KEPPRA) 750 MG tablet Take 1 tablet by mouth twice daily 60 tablet 0   nitroGLYCERIN (NITROSTAT) 0.4 MG SL tablet Place 1 tablet under the tongue every 5 (five) minutes as needed.  0   warfarin (COUMADIN) 3 MG tablet Take 2 tablets (6 mg total) by mouth daily at 4 PM. 60 tablet 3   No current facility-administered medications for this visit.    PHYSICAL EXAMINATION:  ECOG PERFORMANCE STATUS: 1 - Symptomatic but completely ambulatory   Vitals:   07/27/22 1540  BP: (!) 118/56  Pulse: 79  Resp: 14  Temp: 98.3 F (36.8 C)  SpO2: 95%    Filed Weights   07/27/22 1540  Weight: 192 lb 3.2 oz (87.2 kg)     Physical Exam Vitals and nursing note reviewed.  Constitutional:      Appearance: Normal  appearance. She is not toxic-appearing or diaphoretic.  HENT:     Head: Normocephalic and atraumatic.     Right Ear: External ear normal.     Left Ear: External ear normal.     Nose: Nose normal. No congestion or rhinorrhea.  Eyes:     General: No scleral icterus.    Extraocular Movements: Extraocular movements intact.     Conjunctiva/sclera: Conjunctivae normal.     Pupils: Pupils are equal, round, and reactive to light.  Cardiovascular:     Rate and Rhythm: Normal rate.     Heart sounds: No murmur heard.    No friction rub. No gallop.  Abdominal:     General: Bowel sounds are normal.     Palpations: Abdomen is soft.  Musculoskeletal:        General: No swelling, tenderness or deformity.     Cervical back: Normal range of motion and neck supple. No rigidity or tenderness.  Lymphadenopathy:     Head:     Right side of head: No submental, submandibular, tonsillar, preauricular, posterior auricular or occipital adenopathy.     Left side of head: No submental, submandibular, tonsillar, preauricular, posterior auricular or occipital adenopathy.     Cervical: No cervical adenopathy.     Right cervical: No superficial, deep or posterior cervical adenopathy.    Left cervical: No superficial, deep or posterior cervical adenopathy.     Upper Body:     Right upper body: No supraclavicular, axillary, pectoral or epitrochlear adenopathy.     Left upper body: No supraclavicular, axillary, pectoral or epitrochlear adenopathy.  Skin:    General: Skin is warm.     Coloration: Skin is not jaundiced.  Neurological:     General: No focal deficit present.     Mental Status: She is alert and oriented to person, place, and time. Mental status is at baseline.     Cranial Nerves: No cranial nerve deficit.  Psychiatric:        Mood and Affect: Mood normal.        Behavior: Behavior normal.        Thought Content: Thought content normal.        Judgment: Judgment normal.      LABORATORY DATA: I  have personally reviewed  the data as listed:  Appointment on 07/08/2022  Component Date Value Ref Range Status   INR 07/08/2022 2.5 (H)  0.9 - 1.2 Final   Comment: Reference interval is for non-anticoagulated patients. Suggested INR therapeutic range for Vitamin K antagonist therapy:    Standard Dose (moderate intensity                   therapeutic range):       2.0 - 3.0    Higher intensity therapeutic range       2.5 - 3.5    Prothrombin Time 07/08/2022 25.2 (H)  9.1 - 12.0 sec Final  Appointment on 06/29/2022  Component Date Value Ref Range Status   INR 06/29/2022 1.3 (H)  0.9 - 1.2 Final   Comment: Reference interval is for non-anticoagulated patients. Suggested INR therapeutic range for Vitamin K antagonist therapy:    Standard Dose (moderate intensity                   therapeutic range):       2.0 - 3.0    Higher intensity therapeutic range       2.5 - 3.5    Prothrombin Time 06/29/2022 13.4 (H)  9.1 - 12.0 sec Final    RADIOGRAPHIC STUDIES: I have personally reviewed the radiological images as listed and agree with the findings in the report  No results found.  ASSESSMENT/PLAN   47 y.o. female is here because of antiphospholipid antibody syndrome.  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  Imaging with CT angio and MRI demonstrated M1 occlusion with resultant nonhemorrhagic infarction of right temporal lobe, insular cortex basal ganglia  Cardiac imaging to locate an embolic source was negative.  Biochemical evaluation notable for Beta-2 glycoprotein IgG 109.   Antiphospholipid antibody syndrome: This is defined by the presence of antiphospholipid antibodies in the setting of thrombosis.  Most common sites of venous and arterial thrombosis in  the lower limbs and cerebral circulation.  Patient appears to meet diagnostic criteria based on the presence of the beta-2 Glyco protein IgG which is strongly positive in the history of CVA. Will repeat screening for antiphospholipid antibodies.  Standard of care in terms of management is the use of warfarin with a goal INR of 2.0-3.0 long-term addition of low-dose aspirin is also a reasonable we will therefore transition patient back to Coumadin with flat dosing.  Discussed Coumadin management with patient     Cancer Staging  No matching staging information was found for the patient.   No problem-specific Assessment & Plan notes found for this encounter.    Orders Placed This Encounter  Procedures   CBC with Differential/Platelet   Protime-INR    Standing Status:   Future    Standing Expiration Date:   07/27/2023   APTT    Standing Status:   Future    Standing Expiration Date:   07/27/2023    60  minutes was spent in patient care.  This included time spent preparing to see the patient (e.g., review of tests), obtaining and/or reviewing separately obtained history, counseling and educating the patient, ordering medications, tests, or procedures; documenting clinical information in the electronic or other health record, independently interpreting results and communicating results to the patient as well as coordination of care.  All questions were answered. The patient knows to call the clinic with any problems, questions or concerns.  This note was electronically signed.    Barbee Cough, MD  08/01/2022 1:39 PM

## 2022-07-27 NOTE — Patient Instructions (Signed)
Please begin taking Coumadin 6 mg in the evening and continue Eliquis with last dose on Sunday evening.   We will draw blood on Monday to check your INR again Thanks

## 2022-07-28 ENCOUNTER — Telehealth: Payer: Self-pay | Admitting: Oncology

## 2022-07-28 NOTE — Telephone Encounter (Signed)
07/28/22 LVM to schedule next appt

## 2022-07-28 NOTE — Telephone Encounter (Signed)
07/28/22 Spoke with patient and scheduled next appts

## 2022-08-01 ENCOUNTER — Inpatient Hospital Stay: Payer: No Typology Code available for payment source

## 2022-08-01 ENCOUNTER — Other Ambulatory Visit: Payer: Self-pay

## 2022-08-01 DIAGNOSIS — I639 Cerebral infarction, unspecified: Secondary | ICD-10-CM

## 2022-08-01 DIAGNOSIS — D6861 Antiphospholipid syndrome: Secondary | ICD-10-CM

## 2022-08-01 DIAGNOSIS — R7401 Elevation of levels of liver transaminase levels: Secondary | ICD-10-CM

## 2022-08-01 DIAGNOSIS — Z7901 Long term (current) use of anticoagulants: Secondary | ICD-10-CM | POA: Insufficient documentation

## 2022-08-01 DIAGNOSIS — Z8051 Family history of malignant neoplasm of kidney: Secondary | ICD-10-CM | POA: Diagnosis not present

## 2022-08-01 DIAGNOSIS — Z801 Family history of malignant neoplasm of trachea, bronchus and lung: Secondary | ICD-10-CM | POA: Diagnosis not present

## 2022-08-01 DIAGNOSIS — Z79899 Other long term (current) drug therapy: Secondary | ICD-10-CM | POA: Diagnosis not present

## 2022-08-01 DIAGNOSIS — Z8673 Personal history of transient ischemic attack (TIA), and cerebral infarction without residual deficits: Secondary | ICD-10-CM | POA: Diagnosis not present

## 2022-08-01 LAB — COMPREHENSIVE METABOLIC PANEL
ALT: 73 U/L — ABNORMAL HIGH (ref 0–44)
AST: 59 U/L — ABNORMAL HIGH (ref 15–41)
Albumin: 2.8 g/dL — ABNORMAL LOW (ref 3.5–5.0)
Alkaline Phosphatase: 114 U/L (ref 38–126)
Anion gap: 7 (ref 5–15)
BUN: 13 mg/dL (ref 6–20)
CO2: 23 mmol/L (ref 22–32)
Calcium: 7.9 mg/dL — ABNORMAL LOW (ref 8.9–10.3)
Chloride: 105 mmol/L (ref 98–111)
Creatinine, Ser: 0.77 mg/dL (ref 0.44–1.00)
GFR, Estimated: >60 mL/min (ref >60)
Glucose, Bld: 100 mg/dL — ABNORMAL HIGH (ref 70–99)
Potassium: 3.7 mmol/L (ref 3.5–5.1)
Sodium: 135 mmol/L (ref 135–145)
Total Bilirubin: 0.6 mg/dL (ref 0.3–1.2)
Total Protein: 7 g/dL (ref 6.5–8.1)

## 2022-08-01 LAB — CBC WITH DIFFERENTIAL (CANCER CENTER ONLY)
Abs Immature Granulocytes: 0.01 10*3/uL (ref 0.00–0.07)
Basophils Absolute: 0.1 10*3/uL (ref 0.0–0.1)
Basophils Relative: 1 %
Eosinophils Absolute: 0.1 10*3/uL (ref 0.0–0.5)
Eosinophils Relative: 2 %
HCT: 35.9 % — ABNORMAL LOW (ref 36.0–46.0)
Hemoglobin: 10.6 g/dL — ABNORMAL LOW (ref 12.0–15.0)
Immature Granulocytes: 0 %
Lymphocytes Relative: 35 %
Lymphs Abs: 2.4 10*3/uL (ref 0.7–4.0)
MCH: 21.7 pg — ABNORMAL LOW (ref 26.0–34.0)
MCHC: 29.5 g/dL — ABNORMAL LOW (ref 30.0–36.0)
MCV: 73.4 fL — ABNORMAL LOW (ref 80.0–100.0)
Monocytes Absolute: 0.6 10*3/uL (ref 0.1–1.0)
Monocytes Relative: 9 %
Neutro Abs: 3.6 10*3/uL (ref 1.7–7.7)
Neutrophils Relative %: 53 %
Platelet Count: 330 10*3/uL (ref 150–400)
RBC: 4.89 MIL/uL (ref 3.87–5.11)
RDW: 17.4 % — ABNORMAL HIGH (ref 11.5–15.5)
WBC Count: 6.8 10*3/uL (ref 4.0–10.5)
nRBC: 0 % (ref 0.0–0.2)

## 2022-08-01 LAB — PROTIME-INR
INR: 2.2 — ABNORMAL HIGH (ref 0.8–1.2)
Prothrombin Time: 24.5 seconds — ABNORMAL HIGH (ref 11.4–15.2)

## 2022-08-01 LAB — APTT: aPTT: 54 seconds — ABNORMAL HIGH (ref 24–36)

## 2022-08-03 ENCOUNTER — Ambulatory Visit (INDEPENDENT_AMBULATORY_CARE_PROVIDER_SITE_OTHER): Payer: No Typology Code available for payment source | Admitting: Family Medicine

## 2022-08-03 VITALS — BP 108/60 | HR 90 | Temp 97.1°F | Ht 62.0 in | Wt 193.0 lb

## 2022-08-03 DIAGNOSIS — I69354 Hemiplegia and hemiparesis following cerebral infarction affecting left non-dominant side: Secondary | ICD-10-CM | POA: Diagnosis not present

## 2022-08-03 DIAGNOSIS — D6861 Antiphospholipid syndrome: Secondary | ICD-10-CM | POA: Diagnosis not present

## 2022-08-03 DIAGNOSIS — G40909 Epilepsy, unspecified, not intractable, without status epilepticus: Secondary | ICD-10-CM

## 2022-08-03 DIAGNOSIS — E782 Mixed hyperlipidemia: Secondary | ICD-10-CM | POA: Diagnosis not present

## 2022-08-03 DIAGNOSIS — R748 Abnormal levels of other serum enzymes: Secondary | ICD-10-CM

## 2022-08-03 DIAGNOSIS — I152 Hypertension secondary to endocrine disorders: Secondary | ICD-10-CM | POA: Diagnosis not present

## 2022-08-03 DIAGNOSIS — E1159 Type 2 diabetes mellitus with other circulatory complications: Secondary | ICD-10-CM

## 2022-08-03 DIAGNOSIS — I63511 Cerebral infarction due to unspecified occlusion or stenosis of right middle cerebral artery: Secondary | ICD-10-CM

## 2022-08-03 LAB — BETA-2-GLYCOPROTEIN I ABS, IGG/M/A
Beta-2 Glyco I IgG: 127 GPI IgG units — ABNORMAL HIGH (ref 0–20)
Beta-2-Glycoprotein I IgA: 24 GPI IgA units (ref 0–25)
Beta-2-Glycoprotein I IgM: <9 GPI IgM units (ref 0–32)

## 2022-08-03 LAB — CARDIOLIPIN ANTIBODIES, IGM+IGG
Anticardiolipin IgG: 10 GPL U/mL (ref 0–14)
Anticardiolipin IgM: <9 MPL U/mL (ref 0–12)

## 2022-08-03 MED ORDER — LEVETIRACETAM 750 MG PO TABS: 750.0000 mg | ORAL_TABLET | Freq: Two times a day (BID) | ORAL | 1 refills | Status: DC

## 2022-08-03 NOTE — Patient Instructions (Addendum)
Hold atorvastatin.  Recheck cmp in 1 month.

## 2022-08-03 NOTE — Progress Notes (Unsigned)
Subjective:  Patient ID: Shannon Erickson, female    DOB: 17-Aug-1975  Age: 47 y.o. MRN: [id number]  Chief Complaint  Patient presents with   Diabetes   Depression   Hyperlipidemia   Hypertension   HPI:  Hyperlipidemia: On Atorvastatin 80 mg daily without problems.    Hypertension: no antihypertensives. Patient is working on maintaining diet and exercise regimen and follows up as directed.  DMII: taking farxiga 5 mg daily, checks feet daily, does not check FBS, due for eye exam. Left foot has been swollen for the last month. NO pain.   Seizure disorder: She is taking Keppra  750 mg twice a day. No seizures.  [facility].  Dr.Sethi.  Antiphospholipid syndrome: on warfarin 6 mg daily.    Depression/Anxiety: on lexapro 10 mg daily.        08/03/2022   10:35 AM 12/29/2021   10:15 AM 02/02/2021    1:12 PM 12/11/2019   11:21 AM  Depression screen PHQ 2/9  Decreased Interest 0 0 0 0  Down, Depressed, Hopeless 0 0 0 0  PHQ - 2 Score 0 0 0 0         02/22/2017   11:35 AM 07/03/2017    1:53 PM 12/11/2019   11:28 AM 12/29/2021   10:15 AM 08/03/2022   10:40 AM  Fall Risk  Falls in the past year? No No 0 0 0  Was there an injury with Fall?   0  0  Fall Risk Category Calculator   0  0  Fall Risk Category (Retired)   Low    (RETIRED) Patient Fall Risk Level   Low fall risk    Patient at Risk for Falls Due to   Impaired balance/gait;Impaired mobility  Impaired balance/gait;Other (Comment)  Patient at Risk for Falls Due to - Comments     walks with cane hx of stroke  Fall risk Follow up   Falls evaluation completed  Falls evaluation completed      Review of Systems  Constitutional:  Negative for chills, fatigue and fever.  HENT:  Negative for congestion, ear pain, rhinorrhea and sore throat.   Respiratory:  Negative for cough and shortness of breath.   Cardiovascular:  Negative for chest pain.  Gastrointestinal:  Negative for abdominal pain, constipation, diarrhea,  nausea and vomiting.  Genitourinary:  Negative for dysuria and urgency.  Musculoskeletal:  Negative for back pain and myalgias.  Neurological:  Negative for dizziness, weakness, light-headedness and headaches.  Psychiatric/Behavioral:  Negative for dysphoric mood. The patient is not nervous/anxious.     Current Outpatient Medications on File Prior to Visit  Medication Sig Dispense Refill   apixaban (ELIQUIS) 5 MG TABS tablet Take 1 tablet (5 mg total) by mouth 2 (two) times daily. 60 tablet 0   atorvastatin (LIPITOR) 80 MG tablet Take 1 tablet by mouth once daily 90 tablet 1   dapagliflozin propanediol (FARXIGA) 5 MG TABS tablet Take 1 tablet (5 mg total) by mouth daily before breakfast. 100 tablet 0   escitalopram (LEXAPRO) 10 MG tablet Take 1 tablet (10 mg total) by mouth daily. 30 tablet 3   levETIRAcetam (KEPPRA) 750 MG tablet Take 1 tablet by mouth twice daily 60 tablet 0   nitroGLYCERIN (NITROSTAT) 0.4 MG SL tablet Place 1 tablet under the tongue every 5 (five) minutes as needed.  0   warfarin (COUMADIN) 3 MG tablet Take 2 tablets (6 mg total) by mouth daily at 4 PM. 60 tablet 3  No current facility-administered medications on file prior to visit.   Past Medical History:  Diagnosis Date   Hypertension    Lesion of left ulnar nerve    Migraine    Seizures ([place])    Stroke [facility])    Stroke due to occlusion of right middle cerebral artery ([place]) 12/10/2016   Past Surgical History:  Procedure Laterality Date   CESAREAN SECTION Bilateral 1995, 2002   CHOLECYSTECTOMY     LYMPH NODE BIOPSY     MOUTH SURGERY Left 07/04/2021   NO PAST SURGERIES     TEE WITHOUT CARDIOVERSION N/A 12/05/2016   Procedure: TRANSESOPHAGEAL ECHOCARDIOGRAM (TEE);  Surgeon: Acie Fredrickson, Wonda Cheng, MD;  Location: [facility] ENDOSCOPY;  Service: Cardiovascular;  Laterality: N/A;    Family History  Problem Relation Age of Onset   Lung cancer Mother        mets to brain, bone, liver   Hypertension Father    Heart attack  Brother        at age 88   Renal cancer Maternal Grandfather    Heart attack Paternal Grandfather    Social History   Socioeconomic History   Marital status: Single    Spouse name: Not on file   Number of children: 3   Years of education: 12   Highest education level: Not on file  Occupational History   Not on file  Tobacco Use   Smoking status: Never   Smokeless tobacco: Never  Vaping Use   Vaping Use: Never used  Substance and Sexual Activity   Alcohol use: No   Drug use: No   Sexual activity: Not Currently    Birth control/protection: Other-see comments  Other Topics Concern   Not on file  Social History Narrative   Not on file   Social Determinants of Health   Financial Resource Strain: Low Risk  (08/11/2021)   Overall Financial Resource Strain (CARDIA)    Difficulty of Paying Living Expenses: Not hard at all  Food Insecurity: No Food Insecurity (07/27/2022)   Hunger Vital Sign    Worried About Running Out of Food in the Last Year: Never true    [place] in the Last Year: Never true  Transportation Needs: No Transportation Needs (08/11/2021)   PRAPARE - Hydrologist (Medical): No    Lack of Transportation (Non-Medical): No  Physical Activity: Sufficiently Active (08/03/2022)   Exercise Vital Sign    Days of Exercise per Week: 7 days    Minutes of Exercise per Session: 40 min  Stress: No Stress Concern Present (08/03/2022)   [place]    Feeling of Stress : Not at all  Social Connections: Socially Isolated (08/03/2022)   Social Connection and Isolation Panel [NHANES]    Frequency of Communication with Friends and Family: More than three times a week    Frequency of Social Gatherings with Friends and Family: Once a week    Attends Religious Services: Never    Marine scientist or Organizations: No    Attends Music therapist: Never    Marital  Status: Never married    Objective:  Pulse 90   Temp (!) 97.1 F (36.2 C)   Ht '5\' 2"'$  (1.575 m)   Wt 193 lb (87.5 kg)   SpO2 95%   BMI 35.30 kg/m      08/03/2022   10:38 AM 07/27/2022    3:40 PM 06/02/2022  11:23 AM  BP/Weight  Systolic BP  812 751  Diastolic BP  56 75  Wt. (Lbs) 193 192.2 188  BMI 35.3 kg/m2 35.15 kg/m2 33.3 kg/m2    Physical Exam Vitals reviewed.  Constitutional:      Appearance: Normal appearance. She is normal weight.  Neck:     Vascular: No carotid bruit.  Cardiovascular:     Rate and Rhythm: Normal rate and regular rhythm.     Heart sounds: Normal heart sounds.  Pulmonary:     Effort: Pulmonary effort is normal. No respiratory distress.     Breath sounds: Normal breath sounds.  Abdominal:     General: Bowel sounds are normal.     Palpations: Abdomen is soft.     Tenderness: There is no abdominal tenderness.  Musculoskeletal:     Left lower leg: Edema (swelling in left ankle. no injury.) present.  Neurological:     Mental Status: She is alert and oriented to person, place, and time.  Psychiatric:        Mood and Affect: Mood normal.        Behavior: Behavior normal.     Diabetic Foot Exam - Simple   No data filed      Lab Results  Component Value Date   WBC 6.8 08/01/2022   HGB 10.6 (L) 08/01/2022   HCT 35.9 (L) 08/01/2022   PLT 330 08/01/2022   GLUCOSE 100 (H) 08/01/2022   CHOL 136 03/29/2022   TRIG 83 03/29/2022   HDL 54 03/29/2022   LDLCALC 66 03/29/2022   ALT 73 (H) 08/01/2022   AST 59 (H) 08/01/2022   NA 135 08/01/2022   K 3.7 08/01/2022   CL 105 08/01/2022   CREATININE 0.77 08/01/2022   BUN 13 08/01/2022   CO2 23 08/01/2022   TSH 1.150 12/14/2021   INR 2.2 (H) 08/01/2022   HGBA1C 6.5 (H) 03/29/2022   MICROALBUR 30 03/22/2021      Assessment & Plan:    There are no diagnoses linked to this encounter.   No orders of the defined types were placed in this encounter.   No orders of the defined types were  placed in this encounter.    Follow-up: No follow-ups on file.   I,Katherina A Bramblett,acting as a scribe for Rochel Brome, MD.,have documented all relevant documentation on the behalf of Rochel Brome, MD,as directed by  Rochel Brome, MD while in the presence of Rochel Brome, MD.   Geralynn Ochs I Leal-Borjas,acting as a scribe for Rochel Brome, MD.,have documented all relevant documentation on the behalf of Rochel Brome, MD,as directed by  Rochel Brome, MD while in the presence of Rochel Brome, MD.    An After Visit Summary was printed and given to the patient.  Rochel Brome, MD [facility] [phone]

## 2022-08-05 LAB — HEMOGLOBIN A1C
Est. average glucose Bld gHb Est-mCnc: 148 mg/dL
Hgb A1c MFr Bld: 6.8 % — ABNORMAL HIGH (ref 4.8–5.6)

## 2022-08-05 LAB — MICROALBUMIN / CREATININE URINE RATIO
Creatinine, Urine: 119.1 mg/dL
Microalb/Creat Ratio: 36 mg/g creat — ABNORMAL HIGH (ref 0–29)
Microalbumin, Urine: 43.1 ug/mL

## 2022-08-05 LAB — LIPID PANEL
Chol/HDL Ratio: 2.7 ratio (ref 0.0–4.4)
Cholesterol, Total: 141 mg/dL (ref 100–199)
HDL: 53 mg/dL (ref >39)
LDL Chol Calc (NIH): 68 mg/dL (ref 0–99)
Triglycerides: 108 mg/dL (ref 0–149)
VLDL Cholesterol Cal: 20 mg/dL (ref 5–40)

## 2022-08-05 LAB — CARDIOVASCULAR RISK ASSESSMENT

## 2022-08-06 DIAGNOSIS — R748 Abnormal levels of other serum enzymes: Secondary | ICD-10-CM | POA: Insufficient documentation

## 2022-08-06 NOTE — Assessment & Plan Note (Signed)
Continue current medications. Continue physical therapy

## 2022-08-06 NOTE — Assessment & Plan Note (Signed)
Hold atorvastatin.  Recheck cmp in 1 month.  

## 2022-08-06 NOTE — Assessment & Plan Note (Signed)
Management per specialist.  The current medical regimen is effective;  continue present plan and medications. Keppra and gabapentin.

## 2022-08-06 NOTE — Assessment & Plan Note (Signed)
Controlled with diet and exercise.  Labs reviewed that were done recently.

## 2022-08-06 NOTE — Assessment & Plan Note (Addendum)
Well controlled.  Hold atorvastatin.  Continue to work on eating a healthy diet and exercise.  Labs drawn today.

## 2022-08-06 NOTE — Assessment & Plan Note (Signed)
Continue warfarin 6 mg daily.  Management per specialist.

## 2022-08-07 ENCOUNTER — Encounter: Payer: Self-pay | Admitting: Family Medicine

## 2022-08-10 ENCOUNTER — Inpatient Hospital Stay: Payer: No Typology Code available for payment source

## 2022-08-10 ENCOUNTER — Inpatient Hospital Stay (INDEPENDENT_AMBULATORY_CARE_PROVIDER_SITE_OTHER): Payer: No Typology Code available for payment source | Admitting: Oncology

## 2022-08-10 VITALS — BP 136/67 | HR 72 | Resp 18 | Ht 62.0 in | Wt 195.3 lb

## 2022-08-10 DIAGNOSIS — R7401 Elevation of levels of liver transaminase levels: Secondary | ICD-10-CM

## 2022-08-10 DIAGNOSIS — Z7901 Long term (current) use of anticoagulants: Secondary | ICD-10-CM

## 2022-08-10 DIAGNOSIS — R748 Abnormal levels of other serum enzymes: Secondary | ICD-10-CM | POA: Diagnosis not present

## 2022-08-10 DIAGNOSIS — D6861 Antiphospholipid syndrome: Secondary | ICD-10-CM

## 2022-08-10 LAB — URINALYSIS, DIPSTICK ONLY
Bilirubin Urine: NEGATIVE
Glucose, UA: >=500 mg/dL — AB
Ketones, ur: NEGATIVE mg/dL
Leukocytes,Ua: NEGATIVE
Nitrite: NEGATIVE
Protein, ur: NEGATIVE mg/dL
Specific Gravity, Urine: 1.023 (ref 1.005–1.030)
pH: 6 (ref 5.0–8.0)

## 2022-08-10 LAB — PROTIME-INR
INR: 2.1 — ABNORMAL HIGH (ref 0.8–1.2)
Prothrombin Time: 23.2 seconds — ABNORMAL HIGH (ref 11.4–15.2)

## 2022-08-10 LAB — HEPATITIS PANEL, ACUTE
HCV Ab: NONREACTIVE
Hep A IgM: NONREACTIVE
Hep B C IgM: NONREACTIVE
Hepatitis B Surface Ag: NONREACTIVE

## 2022-08-10 NOTE — Progress Notes (Signed)
[facility] Follow up Visit:  Patient Care Team: Rochel Brome, MD as PCP - General (Family Medicine)  CHIEF COMPLAINTS/PURPOSE OF CONSULTATION:  HISTORY OF PRESENTING ILLNESS: Shannon Erickson 47 y.o. 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 Migraines  December 03 2016: Presented to [facility] health emergency department with right sided headache.   CT head without contrast negative for hemorrhage MRI head without contrast negative for hemorrhage but suspected right MCA M1 stenosis or occlusion CT angiogram showed proximal right M1 occlusion.  Evolution of right temporal lobe, insular cortex and right basal ganglia nonhemorrhagic infarction.  Focal irregularity right carotid bifurcation but no significant stenosis.  Fetal type posterior cerebral artery bilaterally TEE negative for PFO or ASO or endocarditis. Lower extremity Dopplers negative for DVT  WBC 7.4 hemoglobin 13.3 platelet count 259 high: 45 segs 42 lymphs 10 monos 2 eos 1 basophil INR 0.94 PTT 29 AT3 113 protein C activity 191 protein S activity 91 Lupus anticoagulant negative Beta-2 glycoprotein IgG 109.  anticardiolipin notable for IgM 13 (borderline) Factor V Leiden gene mutation study negative prothrombin gene mutation study negative ANA and Antidouble-stranded DNA negative RPR and HIV negative Hemoglobin A1c 6.2 Lipid panel notable for LDL of 138 (borderline) Alpha galactosidase 13.2 (low) (Raised the quesiton of Fabry disease) CMP notable for glucose of 138 albumin 2.8  Stroke was thought to be embolic in nature patient was placed on aspirin 325 mg daily Plavix and a statin  March 22 2020: Right upper quadrant abdominal U/S showed a diffusely echogenic hepatic parenchyma  August 14, 2021: Diffuse increased liver echogenicity most  commonly seen in setting of fatty infiltration.  Superimposed inflammation and fibrosis not excluded  July 27 2022:  [facility]  Patient states that her son age 45 was diagnosed with an antiphospholipid Ab at age 4 when he presented with PE.  None of her other children have an antiphospholipid antibody.  She was not on OCP's when the CVA occurred and was physically active.   She has been maintained on Coumadin since 2018 and was recently changed to Eliquis.  She has never been on ASA or plavix.  Coumadin dose was 6 to 7 mg daily with INR's ranging from 1.1 to 3.8.  No bleeding problems with coumadin.  Has been on Lipitor since 2018.  Wilder Glade was started 4 months ago.  Transaminases became elevated but have not been rechecked  Social history:  Disabled.  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  Tobacco none.  EtOH none.    August 01, 2022: WBC 6.8 hemoglobin 10.6 MCV 73 platelet count 330; 53 segs 35 lymphs 9 monos 2 eos 1 basophil Anti-cardiolipan antibody negative beta-2 glycoprotein antibody IgG 127 IgA 24 IgM undetectable INR 2.2 PTT 54   CMP notable for creatinine 0.77 albumin 2.8 AST 59 ALT 73  August 10 2022:  Scheduled follow up.  Reviewed results of lab tests with patient.  She states that the elevation in transaminases noted in November 2023 were attributed to Rybelsis which was stopped.   At that time coumadin dose requirement decreased.  Currently without bleeding issues.  On Coumadin 6 mg daily.     Review of Systems  Constitutional:  Negative for appetite change, chills, fatigue, fever and unexpected weight change.  HENT:   Negative for hearing loss, lump/mass, mouth sores, nosebleeds, sore throat,  tinnitus, trouble swallowing and voice change.   Eyes:  Negative for eye problems and icterus.       Vision changes:  None  Respiratory:  Negative for chest tightness, cough, hemoptysis, shortness of breath and wheezing.        PND:  none Orthopnea:  none DOE:     Cardiovascular:  Negative for chest pain, leg swelling and palpitations.       PND:  none Orthopnea:  none  Gastrointestinal:  Negative for abdominal pain, blood in stool, constipation, diarrhea, nausea, rectal pain and vomiting.  Endocrine: Negative for hot flashes.       Cold intolerance:  none Heat intolerance:  none  Genitourinary:  Negative for bladder incontinence, difficulty urinating, dysuria, frequency, hematuria and nocturia.   Musculoskeletal:  Positive for gait problem. Negative for arthralgias, back pain, myalgias, neck pain and neck stiffness.  Skin:  Negative for itching, rash and wound.  Neurological:  Positive for extremity weakness and gait problem. Negative for dizziness, headaches, light-headedness, numbness, seizures and speech difficulty.  Hematological:  Negative for adenopathy. Does not bruise/bleed easily.  Psychiatric/Behavioral:  Negative for sleep disturbance and suicidal ideas. The patient is not nervous/anxious.     MEDICAL HISTORY: Past Medical History:  Diagnosis Date   Hypertension    Lesion of left ulnar nerve    Migraine    Seizures ([place])    Stroke [facility])    Stroke due to occlusion of right middle cerebral artery ([place]) 12/10/2016    SURGICAL HISTORY: Past Surgical History:  Procedure Laterality Date   CESAREAN SECTION Bilateral 1995, 2002   CHOLECYSTECTOMY     LYMPH NODE BIOPSY     MOUTH SURGERY Left 07/04/2021   NO PAST SURGERIES     TEE WITHOUT CARDIOVERSION N/A 12/05/2016   Procedure: TRANSESOPHAGEAL ECHOCARDIOGRAM (TEE);  Surgeon: Acie Fredrickson, Wonda Cheng, MD;  Location: [facility] ENDOSCOPY;  Service: Cardiovascular;  Laterality: N/A;    SOCIAL HISTORY: Social History   Socioeconomic History   Marital status: Single    Spouse name: Not on file   Number of children: 3   Years of education: 12   Highest education level: Not on file  Occupational History   Not on file  Tobacco Use   Smoking status: Never   Smokeless tobacco: Never  Vaping Use    Vaping Use: Never used  Substance and Sexual Activity   Alcohol use: No   Drug use: No   Sexual activity: Not Currently    Birth control/protection: Other-see comments  Other Topics Concern   Not on file  Social History Narrative   Not on file   Social Determinants of Health   Financial Resource Strain: Low Risk  (08/11/2021)   Overall Financial Resource Strain (CARDIA)    Difficulty of Paying Living Expenses: Not hard at all  Food Insecurity: No Food Insecurity (07/27/2022)   Hunger Vital Sign    Worried About Running Out of Food in the Last Year: Never true    Ran Out of Food in the Last Year: Never true  Transportation Needs: No Transportation Needs (08/11/2021)   PRAPARE - Hydrologist (Medical): No    Lack of Transportation (Non-Medical): No  Physical Activity: Sufficiently Active (08/03/2022)   Exercise Vital Sign    Days of Exercise per Week: 7 days    Minutes of Exercise per Session: 40 min  Stress: No Stress Concern Present (08/03/2022)   Experiment  Feeling of Stress : Not at all  Social Connections: Socially Isolated (08/03/2022)   Social Connection and Isolation Panel [NHANES]    Frequency of Communication with Friends and Family: More than three times a week    Frequency of Social Gatherings with Friends and Family: Once a week    Attends Religious Services: Never    Marine scientist or Organizations: No    Attends Archivist Meetings: Never    Marital Status: Never married  Intimate Partner Violence: Not At Risk (07/27/2022)   Humiliation, Afraid, Rape, and Kick questionnaire    Fear of Current or Ex-Partner: No    Emotionally Abused: No    Physically Abused: No    Sexually Abused: No    FAMILY HISTORY Family History  Problem Relation Age of Onset   Lung cancer Mother        mets to brain, bone, liver   Hypertension Father    Heart attack Brother         at age 23   Renal cancer Maternal Grandfather    Heart attack Paternal Grandfather     ALLERGIES:  is allergic to penicillins.  MEDICATIONS:  Current Outpatient Medications  Medication Sig Dispense Refill   apixaban (ELIQUIS) 5 MG TABS tablet Take 1 tablet (5 mg total) by mouth 2 (two) times daily. 60 tablet 0   atorvastatin (LIPITOR) 80 MG tablet Take 1 tablet by mouth once daily 90 tablet 1   dapagliflozin propanediol (FARXIGA) 5 MG TABS tablet Take 1 tablet (5 mg total) by mouth daily before breakfast. 100 tablet 0   escitalopram (LEXAPRO) 10 MG tablet Take 1 tablet (10 mg total) by mouth daily. 30 tablet 3   levETIRAcetam (KEPPRA) 750 MG tablet Take 1 tablet (750 mg total) by mouth 2 (two) times daily. 60 tablet 1   nitroGLYCERIN (NITROSTAT) 0.4 MG SL tablet Place 1 tablet under the tongue every 5 (five) minutes as needed.  0   warfarin (COUMADIN) 3 MG tablet Take 2 tablets (6 mg total) by mouth daily at 4 PM. 60 tablet 3   No current facility-administered medications for this visit.    PHYSICAL EXAMINATION:  ECOG PERFORMANCE STATUS: 1 - Symptomatic but completely ambulatory   There were no vitals filed for this visit.   There were no vitals filed for this visit.    Physical Exam Vitals and nursing note reviewed.  Constitutional:      Appearance: Normal appearance. She is not toxic-appearing or diaphoretic.  HENT:     Head: Normocephalic and atraumatic.     Right Ear: External ear normal.     Left Ear: External ear normal.     Nose: Nose normal. No congestion or rhinorrhea.  Eyes:     General: No scleral icterus.    Extraocular Movements: Extraocular movements intact.     Conjunctiva/sclera: Conjunctivae normal.     Pupils: Pupils are equal, round, and reactive to light.  Cardiovascular:     Rate and Rhythm: Normal rate.     Heart sounds: No murmur heard.    No friction rub. No gallop.  Abdominal:     General: Bowel sounds are normal.     Palpations:  Abdomen is soft.  Musculoskeletal:        General: No swelling, tenderness or deformity.     Cervical back: Normal range of motion and neck supple. No rigidity or tenderness.  Lymphadenopathy:     Head:     Right side  of head: No submental, submandibular, tonsillar, preauricular, posterior auricular or occipital adenopathy.     Left side of head: No submental, submandibular, tonsillar, preauricular, posterior auricular or occipital adenopathy.     Cervical: No cervical adenopathy.     Right cervical: No superficial, deep or posterior cervical adenopathy.    Left cervical: No superficial, deep or posterior cervical adenopathy.     Upper Body:     Right upper body: No supraclavicular, axillary, pectoral or epitrochlear adenopathy.     Left upper body: No supraclavicular, axillary, pectoral or epitrochlear adenopathy.  Skin:    General: Skin is warm.     Coloration: Skin is not jaundiced.  Neurological:     General: No focal deficit present.     Mental Status: She is alert and oriented to person, place, and time. Mental status is at baseline.     Cranial Nerves: No cranial nerve deficit.  Psychiatric:        Mood and Affect: Mood normal.        Behavior: Behavior normal.        Thought Content: Thought content normal.        Judgment: Judgment normal.      LABORATORY DATA: I have personally reviewed the data as listed:  Office Visit on 08/03/2022  Component Date Value Ref Range Status   Hgb A1c MFr Bld 08/03/2022 6.8 (H)  4.8 - 5.6 % Final   Comment:          Prediabetes: 5.7 - 6.4          Diabetes: >6.4          Glycemic control for adults with diabetes: <7.0    Est. average glucose Bld gHb Est-m* 08/03/2022 148  mg/dL Final   Cholesterol, Total 08/03/2022 141  100 - 199 mg/dL Final   Triglycerides 08/03/2022 108  0 - 149 mg/dL Final   HDL 08/03/2022 53  >39 mg/dL Final   VLDL Cholesterol Cal 08/03/2022 20  5 - 40 mg/dL Final   LDL Chol Calc (NIH) 08/03/2022 68  0 - 99  mg/dL Final   Chol/HDL Ratio 08/03/2022 2.7  0.0 - 4.4 ratio Final   Comment:                                   T. Chol/HDL Ratio                                             Men  Women                               1/2 Avg.Risk  3.4    3.3                                   Avg.Risk  5.0    4.4                                2X Avg.Risk  9.6    7.1  3X Avg.Risk 23.4   11.0    Creatinine, Urine 08/03/2022 119.1  Not Estab. mg/dL Final   Microalbumin, Urine 08/03/2022 43.1  Not Estab. ug/mL Final   Microalb/Creat Ratio 08/03/2022 36 (H)  0 - 29 mg/g creat Final   Comment:                        Normal:                0 -  29                        Moderately increased: 30 - 300                        Severely increased:       >300    Interpretation 08/03/2022 Note   Final   Supplemental report is available.  Appointment on 08/01/2022  Component Date Value Ref Range Status   WBC Count 08/01/2022 6.8  4.0 - 10.5 K/uL Final   RBC 08/01/2022 4.89  3.87 - 5.11 MIL/uL Final   Hemoglobin 08/01/2022 10.6 (L)  12.0 - 15.0 g/dL Final   HCT 08/01/2022 35.9 (L)  36.0 - 46.0 % Final   MCV 08/01/2022 73.4 (L)  80.0 - 100.0 fL Final   MCH 08/01/2022 21.7 (L)  26.0 - 34.0 pg Final   MCHC 08/01/2022 29.5 (L)  30.0 - 36.0 g/dL Final   RDW 08/01/2022 17.4 (H)  11.5 - 15.5 % Final   Platelet Count 08/01/2022 330  150 - 400 K/uL Final   nRBC 08/01/2022 0.0  0.0 - 0.2 % Final   Neutrophils Relative % 08/01/2022 53  % Final   Neutro Abs 08/01/2022 3.6  1.7 - 7.7 K/uL Final   Lymphocytes Relative 08/01/2022 35  % Final   Lymphs Abs 08/01/2022 2.4  0.7 - 4.0 K/uL Final   Monocytes Relative 08/01/2022 9  % Final   Monocytes Absolute 08/01/2022 0.6  0.1 - 1.0 K/uL Final   Eosinophils Relative 08/01/2022 2  % Final   Eosinophils Absolute 08/01/2022 0.1  0.0 - 0.5 K/uL Final   Basophils Relative 08/01/2022 1  % Final   Basophils Absolute 08/01/2022 0.1  0.0 - 0.1 K/uL Final    Immature Granulocytes 08/01/2022 0  % Final   Abs Immature Granulocytes 08/01/2022 0.01  0.00 - 0.07 K/uL Final   Performed at [facility], [place] [address], [place], [place] [address]   Sodium 08/01/2022 135  135 - 145 mmol/L Final   Potassium 08/01/2022 3.7  3.5 - 5.1 mmol/L Final   Chloride 08/01/2022 105  98 - 111 mmol/L Final   CO2 08/01/2022 23  22 - 32 mmol/L Final   Glucose, Bld 08/01/2022 100 (H)  70 - 99 mg/dL Final   Glucose reference range applies only to samples taken after fasting for at least 8 hours.   BUN 08/01/2022 13  6 - 20 mg/dL Final   Creatinine, Ser 08/01/2022 0.77  0.44 - 1.00 mg/dL Final   Calcium 08/01/2022 7.9 (L)  8.9 - 10.3 mg/dL Final   Total Protein 08/01/2022 7.0  6.5 - 8.1 g/dL Final   Albumin 08/01/2022 2.8 (L)  3.5 - 5.0 g/dL Final   AST 08/01/2022 59 (H)  15 - 41 U/L Final   ALT 08/01/2022 73 (H)  0 - 44 U/L Final   Alkaline Phosphatase 08/01/2022 114  38 - 126 U/L Final   Total Bilirubin 08/01/2022 0.6  0.3 - 1.2 mg/dL Final   GFR, Estimated 08/01/2022 >60  >60 mL/min Final   Comment: (NOTE) Calculated using the CKD-EPI Creatinine Equation (2021)    Anion gap 08/01/2022 7  5 - 15 Final   Performed at [facility], [place] [address], [place], [place] [address]   Anticardiolipin IgG 08/01/2022 10  0 - 14 GPL U/mL Final   Comment: (NOTE)                          Negative:              <15                          Indeterminate:     15 - 20                          Low-Med Positive: >20 - 80                          High Positive:         >80    Anticardiolipin IgM 08/01/2022 <9  0 - 12 MPL U/mL Final   Comment: (NOTE)                          Negative:              <13                          Indeterminate:     13 - 20                          Low-Med Positive: >20 - 80                          High Positive:         >80 Performed At: [facility] [place], [place] [id number] Rush Farmer MD [id number]    Beta-2 Glyco I IgG 08/01/2022 127 (H)  0 - 20 GPI IgG units Final   Comment: (NOTE) The reference interval reflects a 3SD or 99th percentile interval, which is thought to represent a potentially clinically significant result in accordance with the International Consensus Statement on the classification criteria for definitive antiphospholipid syndrome (APS). J Thromb Haem 2006;4:295-306.    Beta-2-Glycoprotein I IgM 08/01/2022 <9  0 - 32 GPI IgM units Final   Comment: (NOTE) The reference interval reflects a 3SD or 99th percentile interval, which is thought to represent a potentially clinically significant result in accordance with the International Consensus Statement on the classification criteria for definitive antiphospholipid syndrome (APS). J Thromb Haem 2006;4:295-306. Performed At: [facility] [place], [place] [id number] Rush Farmer MD [id number]    Beta-2-Glycoprotein I IgA 08/01/2022 24  0 - 25 GPI IgA units Final   Comment: (NOTE) The reference interval reflects a 3SD or 99th percentile interval, which is thought to represent a potentially clinically significant result in accordance with the International Consensus Statement on the classification criteria for definitive antiphospholipid syndrome (APS). J Thromb Haem 2006;4:295-306.    aPTT 08/01/2022 54 (H)  24 -  36 seconds Final   Comment:        IF BASELINE aPTT IS ELEVATED, SUGGEST PATIENT RISK ASSESSMENT BE USED TO DETERMINE APPROPRIATE ANTICOAGULANT THERAPY. Performed at [facility], [place] [address] [place], [place] [address]    Prothrombin Time 08/01/2022 24.5 (H)  11.4 - 15.2 seconds Final   INR 08/01/2022 2.2 (H)  0.8 - 1.2 Final   Comment: (NOTE) INR goal varies based on device and disease states. Performed at [facility], [place] [address], [place], [place] [address]     RADIOGRAPHIC STUDIES: I have  personally reviewed the radiological images as listed and agree with the findings in the report  No results found.  ASSESSMENT/PLAN   47 y.o. female is here because of antiphospholipid antibody syndrome.  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  Imaging with CT angio and MRI demonstrated M1 occlusion with resultant nonhemorrhagic infarction of right temporal lobe, insular cortex basal ganglia  Cardiac imaging to locate an embolic source was negative.  Biochemical evaluation notable for Beta-2 glycoprotein IgG 109.   Antiphospholipid antibody syndrome: This is defined by the presence of antiphospholipid antibodies in the setting of thrombosis.  Most common sites of venous and arterial thrombosis in the lower limbs and cerebral circulation.  Patient appears to meet diagnostic criteria based on the presence of the beta-2 glycoprotein IgG which is strongly positive in the history of CVA. July 27 2022- Standard of care in terms of management is the use of warfarin with a goal INR of 2.0-3.0 long-term addition of low-dose aspirin is also a reasonable we will therefore transition patient back to Coumadin with flat dosing.  Discussed Coumadin management with patient    August 01 2022- Anti-cardiolipan antibody negative beta-2 glycoprotein antibody IgG 127 IgA 24 IgM undetectable INR 2.2 PTT 54 .  Patient on Coumadin 6 mg daily  August 10 2022- Patient revealed that in November 2023 she was taking Rybelsus; this likely accounted for the large elevation in transaminases and difficulty in managing Coumadin dosing.    Cancer Staging  No matching staging information was found for the patient.   No problem-specific Assessment & Plan notes found for this encounter.    No orders of  the defined types were placed in this encounter.   31  minutes was spent in patient care.  This included time spent preparing to see the patient (e.g., review of tests), obtaining and/or reviewing separately obtained history, counseling and educating the patient, ordering medications, tests, or procedures; documenting clinical information in the electronic or other health record, independently interpreting results and communicating results to the patient as well as coordination of care.       All questions were answered. The patient knows to call the clinic with any problems, questions or concerns.  This note was electronically signed.    Barbee Cough, MD  08/10/2022 1:45 PM

## 2022-08-11 ENCOUNTER — Other Ambulatory Visit: Payer: Self-pay

## 2022-08-11 DIAGNOSIS — I63511 Cerebral infarction due to unspecified occlusion or stenosis of right middle cerebral artery: Secondary | ICD-10-CM

## 2022-08-11 LAB — MISC LABCORP TEST (SEND OUT): Labcorp test code: 307560

## 2022-08-12 LAB — ANTI-MICROSOMAL ANTIBODY LIVER / KIDNEY: LKM1 Ab: 1.8 Units (ref 0.0–20.0)

## 2022-08-12 LAB — ANA COMPREHENSIVE PANEL
Anti JO-1: <0.2 AI (ref 0.0–0.9)
Centromere Ab Screen: 0.4 AI (ref 0.0–0.9)
Chromatin Ab SerPl-aCnc: <0.2 AI (ref 0.0–0.9)
ENA SM Ab Ser-aCnc: <0.2 AI (ref 0.0–0.9)
Ribonucleic Protein: <0.2 AI (ref 0.0–0.9)
SSA (Ro) (ENA) Antibody, IgG: <0.2 AI (ref 0.0–0.9)
SSB (La) (ENA) Antibody, IgG: <0.2 AI (ref 0.0–0.9)
Scleroderma (Scl-70) (ENA) Antibody, IgG: <0.2 AI (ref 0.0–0.9)
ds DNA Ab: <1 IU/mL (ref 0–9)

## 2022-08-12 LAB — EPSTEIN BARR VRS(EBV DNA BY PCR): EBV DNA QN by PCR: NEGATIVE IU/mL

## 2022-08-12 LAB — CMV DNA, QUANTITATIVE, PCR
CMV DNA Quant: NEGATIVE IU/mL
Log10 CMV Qn DNA Pl: UNDETERMINED log10 IU/mL

## 2022-08-12 LAB — CERULOPLASMIN: Ceruloplasmin: 34.5 mg/dL (ref 19.0–39.0)

## 2022-08-12 LAB — IGG 4: IgG, Subclass 4: 66 mg/dL (ref 2–96)

## 2022-08-12 LAB — ALPHA-1-ANTITRYPSIN: A-1 Antitrypsin, Ser: 198 mg/dL — ABNORMAL HIGH (ref 101–187)

## 2022-08-23 DIAGNOSIS — Z736 Limitation of activities due to disability: Secondary | ICD-10-CM | POA: Diagnosis not present

## 2022-08-23 DIAGNOSIS — I69354 Hemiplegia and hemiparesis following cerebral infarction affecting left non-dominant side: Secondary | ICD-10-CM | POA: Diagnosis not present

## 2022-08-24 ENCOUNTER — Inpatient Hospital Stay: Payer: No Typology Code available for payment source | Attending: Oncology

## 2022-08-24 DIAGNOSIS — E119 Type 2 diabetes mellitus without complications: Secondary | ICD-10-CM | POA: Insufficient documentation

## 2022-08-24 DIAGNOSIS — I10 Essential (primary) hypertension: Secondary | ICD-10-CM | POA: Insufficient documentation

## 2022-08-24 DIAGNOSIS — Z8673 Personal history of transient ischemic attack (TIA), and cerebral infarction without residual deficits: Secondary | ICD-10-CM | POA: Diagnosis not present

## 2022-08-24 DIAGNOSIS — R269 Unspecified abnormalities of gait and mobility: Secondary | ICD-10-CM | POA: Diagnosis not present

## 2022-08-24 DIAGNOSIS — G43909 Migraine, unspecified, not intractable, without status migrainosus: Secondary | ICD-10-CM | POA: Diagnosis not present

## 2022-08-24 DIAGNOSIS — Z79899 Other long term (current) drug therapy: Secondary | ICD-10-CM | POA: Diagnosis not present

## 2022-08-24 DIAGNOSIS — Z9049 Acquired absence of other specified parts of digestive tract: Secondary | ICD-10-CM | POA: Diagnosis not present

## 2022-08-24 DIAGNOSIS — D649 Anemia, unspecified: Secondary | ICD-10-CM | POA: Diagnosis not present

## 2022-08-24 DIAGNOSIS — Z880 Allergy status to penicillin: Secondary | ICD-10-CM | POA: Diagnosis not present

## 2022-08-24 DIAGNOSIS — R531 Weakness: Secondary | ICD-10-CM | POA: Diagnosis not present

## 2022-08-24 DIAGNOSIS — E7521 Fabry (-Anderson) disease: Secondary | ICD-10-CM | POA: Insufficient documentation

## 2022-08-24 DIAGNOSIS — Z7901 Long term (current) use of anticoagulants: Secondary | ICD-10-CM | POA: Diagnosis not present

## 2022-08-24 DIAGNOSIS — D6861 Antiphospholipid syndrome: Secondary | ICD-10-CM | POA: Insufficient documentation

## 2022-08-24 DIAGNOSIS — I63511 Cerebral infarction due to unspecified occlusion or stenosis of right middle cerebral artery: Secondary | ICD-10-CM

## 2022-08-24 LAB — HEMOCHROMATOSIS DNA-PCR(C282Y,H63D)

## 2022-08-24 LAB — PROTIME-INR
INR: 1.1 (ref 0.8–1.2)
Prothrombin Time: 14.1 seconds (ref 11.4–15.2)

## 2022-09-05 ENCOUNTER — Telehealth: Payer: Self-pay

## 2022-09-05 NOTE — Telephone Encounter (Signed)
Shannon Erickson called from the [facility] requesting a letter of medical necessity for her AFO for foot drop faxed to [phone].  It was denied by her insurance.

## 2022-09-07 ENCOUNTER — Inpatient Hospital Stay (INDEPENDENT_AMBULATORY_CARE_PROVIDER_SITE_OTHER): Payer: No Typology Code available for payment source | Admitting: Oncology

## 2022-09-07 ENCOUNTER — Telehealth: Payer: Self-pay | Admitting: Oncology

## 2022-09-07 ENCOUNTER — Inpatient Hospital Stay: Payer: No Typology Code available for payment source

## 2022-09-07 VITALS — BP 115/58 | HR 85 | Temp 98.4°F | Resp 14 | Ht 62.0 in | Wt 192.7 lb

## 2022-09-07 DIAGNOSIS — Z7901 Long term (current) use of anticoagulants: Secondary | ICD-10-CM

## 2022-09-07 DIAGNOSIS — D6861 Antiphospholipid syndrome: Secondary | ICD-10-CM

## 2022-09-07 DIAGNOSIS — D539 Nutritional anemia, unspecified: Secondary | ICD-10-CM | POA: Diagnosis not present

## 2022-09-07 DIAGNOSIS — I63511 Cerebral infarction due to unspecified occlusion or stenosis of right middle cerebral artery: Secondary | ICD-10-CM

## 2022-09-07 LAB — CBC WITH DIFFERENTIAL/PLATELET
Abs Immature Granulocytes: 0.01 10*3/uL (ref 0.00–0.07)
Basophils Absolute: 0.1 10*3/uL (ref 0.0–0.1)
Basophils Relative: 1 %
Eosinophils Absolute: 0.1 10*3/uL (ref 0.0–0.5)
Eosinophils Relative: 1 %
HCT: 34.7 % — ABNORMAL LOW (ref 36.0–46.0)
Hemoglobin: 10.1 g/dL — ABNORMAL LOW (ref 12.0–15.0)
Immature Granulocytes: 0 %
Lymphocytes Relative: 35 %
Lymphs Abs: 2.2 10*3/uL (ref 0.7–4.0)
MCH: 20.9 pg — ABNORMAL LOW (ref 26.0–34.0)
MCHC: 29.1 g/dL — ABNORMAL LOW (ref 30.0–36.0)
MCV: 71.8 fL — ABNORMAL LOW (ref 80.0–100.0)
Monocytes Absolute: 0.5 10*3/uL (ref 0.1–1.0)
Monocytes Relative: 8 %
Neutro Abs: 3.3 10*3/uL (ref 1.7–7.7)
Neutrophils Relative %: 55 %
Platelets: 306 10*3/uL (ref 150–400)
RBC: 4.83 MIL/uL (ref 3.87–5.11)
RDW: 17.1 % — ABNORMAL HIGH (ref 11.5–15.5)
WBC: 6.2 10*3/uL (ref 4.0–10.5)
nRBC: 0 % (ref 0.0–0.2)

## 2022-09-07 LAB — COMPREHENSIVE METABOLIC PANEL
ALT: 67 U/L — ABNORMAL HIGH (ref 0–44)
AST: 45 U/L — ABNORMAL HIGH (ref 15–41)
Albumin: 2.6 g/dL — ABNORMAL LOW (ref 3.5–5.0)
Alkaline Phosphatase: 123 U/L (ref 38–126)
Anion gap: 6 (ref 5–15)
BUN: 10 mg/dL (ref 6–20)
CO2: 26 mmol/L (ref 22–32)
Calcium: 8.1 mg/dL — ABNORMAL LOW (ref 8.9–10.3)
Chloride: 105 mmol/L (ref 98–111)
Creatinine, Ser: 0.75 mg/dL (ref 0.44–1.00)
GFR, Estimated: >60 mL/min (ref >60)
Glucose, Bld: 108 mg/dL — ABNORMAL HIGH (ref 70–99)
Potassium: 4.2 mmol/L (ref 3.5–5.1)
Sodium: 137 mmol/L (ref 135–145)
Total Bilirubin: 0.5 mg/dL (ref 0.3–1.2)
Total Protein: 6.8 g/dL (ref 6.5–8.1)

## 2022-09-07 LAB — PROTIME-INR
INR: 1.1 (ref 0.8–1.2)
Prothrombin Time: 13.9 seconds (ref 11.4–15.2)

## 2022-09-07 NOTE — Progress Notes (Signed)
[facility] Cancer Follow up Visit:  Patient Care Team: Blane Ohara, MD as PCP - General (Family Medicine)  CHIEF COMPLAINTS/PURPOSE OF CONSULTATION:  HISTORY OF PRESENTING ILLNESS: Shannon Erickson 47 y.o. 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 Migraines  December 03 2016: Presented to [facility] health emergency department with right sided headache.   CT head without contrast negative for hemorrhage MRI head without contrast negative for hemorrhage but suspected right MCA M1 stenosis or occlusion CT angiogram showed proximal right M1 occlusion.  Evolution of right temporal lobe, insular cortex and right basal ganglia nonhemorrhagic infarction.  Focal irregularity right carotid bifurcation but no significant stenosis.  Fetal type posterior cerebral artery bilaterally TEE negative for PFO or ASO or endocarditis. Lower extremity Dopplers negative for DVT  WBC 7.4 hemoglobin 13.3 platelet count 259 high: 45 segs 42 lymphs 10 monos 2 eos 1 basophil INR 0.94 PTT 29 AT3 113 protein C activity 191 protein S activity 91 Lupus anticoagulant negative Beta-2 glycoprotein IgG 109.  anticardiolipin notable for IgM 13 (borderline) Factor V Leiden gene mutation study negative prothrombin gene mutation study negative ANA and Antidouble-stranded DNA negative RPR and HIV negative Hemoglobin A1c 6.2 Lipid panel notable for LDL of 138 (borderline) Alpha galactosidase 13.2 (low) (Raised the quesiton of Fabry disease) CMP notable for glucose of 138 albumin 2.8  Stroke was thought to be embolic in nature patient was placed on aspirin 325 mg daily Plavix and a statin  March 22 2020: Right upper quadrant abdominal U/S showed a diffusely echogenic hepatic parenchyma  August 14, 2021: Diffuse increased liver echogenicity most  commonly seen in setting of fatty infiltration.  Superimposed inflammation and fibrosis not excluded  July 27 2022:  [facility] Hematology Consult  Patient states that her son age 73 was diagnosed with an antiphospholipid Ab at age 71 when he presented with PE.  None of her other children have an antiphospholipid antibody.  She was not on OCP's when the CVA occurred and was physically active.   She has been maintained on Coumadin since 2018 and was recently changed to Eliquis.  She has never been on ASA or plavix.  Coumadin dose was 6 to 7 mg daily with INR's ranging from 1.1 to 3.8.  No bleeding problems with coumadin.  Has been on Lipitor since 2018.  Marcelline Deist was started 4 months ago.  Transaminases became elevated but have not been rechecked  Social history:  Disabled.  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  Tobacco none.  EtOH none.    August 01, 2022: WBC 6.8 hemoglobin 10.6 MCV 73 platelet count 330; 53 segs 35 lymphs 9 monos 2 eos 1 basophil Anti-cardiolipan antibody negative beta-2 glycoprotein antibody IgG 127 IgA 24 IgM undetectable INR 2.2 PTT 54   CMP notable for creatinine 0.77 albumin 2.8 AST 59 ALT 73  August 10 2022:  Scheduled follow up.  Reviewed results of lab tests with patient.  She states that the elevation in transaminases noted in November 2023 were attributed to Rybelsis which was stopped.   At that time coumadin dose requirement decreased.  Currently without bleeding issues.  On Coumadin 6 mg daily.   Tissue transglutaminase antibody negative Alpha-1 antitrypsin 198 ANA panel negative LK M1 antibody negative.  Chromatin antibody negative Hepatitis  ABC serologies negative.  CMV PCR negative.  EBV PCR negative.  Ceruloplasmin  34.5. UA showed greater than 500 glucose.  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  Reviewed results of labs with patient.   Currently on Coumadin 6 mg daily.  No bleeding issues.  Patient was begun on Farxiga last month.  May account for why she was subtherapeutic.    Review of Systems  Constitutional:  Negative for appetite change, chills, fatigue, fever and unexpected weight change.  HENT:   Negative for hearing loss, lump/mass, mouth sores, nosebleeds, sore throat, tinnitus, trouble swallowing and voice change.   Eyes:  Negative for eye problems and icterus.       Vision changes:  None  Respiratory:  Negative for chest tightness, cough, hemoptysis, shortness of breath and wheezing.        PND:  none Orthopnea:  none DOE:    Cardiovascular:  Negative for chest pain, leg swelling and palpitations.       PND:  none Orthopnea:  none  Gastrointestinal:  Negative for abdominal pain, blood in stool, constipation, diarrhea, nausea, rectal pain and vomiting.  Endocrine: Negative for hot flashes.       Cold intolerance:  none Heat intolerance:  none  Genitourinary:  Negative for bladder incontinence, difficulty urinating, dysuria, frequency, hematuria and nocturia.   Musculoskeletal:  Positive for gait problem. Negative for arthralgias, back pain, myalgias, neck pain and neck stiffness.  Skin:  Negative for itching, rash and wound.  Neurological:  Positive for extremity weakness and gait problem. Negative for dizziness, headaches, light-headedness, numbness, seizures and speech difficulty.  Hematological:  Negative for adenopathy. Does not bruise/bleed easily.  Psychiatric/Behavioral:  Negative for sleep disturbance and suicidal ideas. The patient is not nervous/anxious.     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) 12/10/2016    SURGICAL HISTORY: Past Surgical History:  Procedure Laterality Date   CESAREAN SECTION Bilateral 1995, 2002    CHOLECYSTECTOMY     LYMPH NODE BIOPSY     MOUTH SURGERY Left 07/04/2021   NO PAST SURGERIES     TEE WITHOUT CARDIOVERSION N/A 12/05/2016   Procedure: TRANSESOPHAGEAL ECHOCARDIOGRAM (TEE);  Surgeon: Elease Hashimoto, Deloris Ping, MD;  Location[facility] ENDOSCOPY;  Service: Cardiovascular;  Laterality: N/A;    SOCIAL HISTORY: Social History   Socioeconomic History   Marital status: Single    Spouse name: Not on file   Number of children: 3   Years of education: 12   Highest education level: Not on file  Occupational History   Not on file  Tobacco Use   Smoking status: Never   Smokeless tobacco: Never  Vaping Use   Vaping Use: Never used  Substance and Sexual Activity   Alcohol use: No   Drug use: No   Sexual activity: Not Currently    Birth control/protection: Other-see comments  Other Topics Concern   Not on file  Social History Narrative   Not on file   Social Determinants of Health   Financial Resource Strain: Low Risk  (08/11/2021)   Overall Financial Resource Strain (CARDIA)    Difficulty of Paying Living Expenses: Not hard at all  Food Insecurity: No Food Insecurity (07/27/2022)  Hunger Vital Sign    Worried About Running Out of Food in the Last Year: Never true    Ran Out of Food in the Last Year: Never true  Transportation Needs: No Transportation Needs (08/11/2021)   PRAPARE - Administrator, Civil Service (Medical): No    Lack of Transportation (Non-Medical): No  Physical Activity: Sufficiently Active (08/03/2022)   Exercise Vital Sign    Days of Exercise per Week: 7 days    Minutes of Exercise per Session: 40 min  Stress: No Stress Concern Present (08/03/2022)   [employer] of Occupational Health - Occupational Stress Questionnaire    Feeling of Stress : Not at all  Social Connections: Socially Isolated (08/03/2022)   Social Connection and Isolation Panel [NHANES]    Frequency of Communication with Friends and Family: More than three times a week     Frequency of Social Gatherings with Friends and Family: Once a week    Attends Religious Services: Never    Database administrator or Organizations: No    Attends Banker Meetings: Never    Marital Status: Never married  Intimate Partner Violence: Not At Risk (07/27/2022)   Humiliation, Afraid, Rape, and Kick questionnaire    Fear of Current or Ex-Partner: No    Emotionally Abused: No    Physically Abused: No    Sexually Abused: No    FAMILY HISTORY Family History  Problem Relation Age of Onset   Lung cancer Mother        mets to brain, bone, liver   Hypertension Father    Heart attack Brother        at age 41   Renal cancer Maternal Grandfather    Heart attack Paternal Grandfather     ALLERGIES:  is allergic to penicillins.  MEDICATIONS:  Current Outpatient Medications  Medication Sig Dispense Refill   apixaban (ELIQUIS) 5 MG TABS tablet Take 1 tablet (5 mg total) by mouth 2 (two) times daily. 60 tablet 0   atorvastatin (LIPITOR) 80 MG tablet Take 1 tablet by mouth once daily 90 tablet 1   dapagliflozin propanediol (FARXIGA) 5 MG TABS tablet Take 1 tablet (5 mg total) by mouth daily before breakfast. 100 tablet 0   escitalopram (LEXAPRO) 10 MG tablet Take 1 tablet (10 mg total) by mouth daily. 30 tablet 3   levETIRAcetam (KEPPRA) 750 MG tablet Take 1 tablet (750 mg total) by mouth 2 (two) times daily. 60 tablet 1   nitroGLYCERIN (NITROSTAT) 0.4 MG SL tablet Place 1 tablet under the tongue every 5 (five) minutes as needed.  0   warfarin (COUMADIN) 3 MG tablet Take 2 tablets (6 mg total) by mouth daily at 4 PM. 60 tablet 3   No current facility-administered medications for this visit.    PHYSICAL EXAMINATION:  ECOG PERFORMANCE STATUS: 1 - Symptomatic but completely ambulatory   Vitals:   09/07/22 1427  BP: (!) 115/58  Pulse: 85  Resp: 14  Temp: 98.4 F (36.9 C)  SpO2: 94%     Filed Weights   09/07/22 1427  Weight: 192 lb 11.2 oz (87.4 kg)       Physical Exam Vitals and nursing note reviewed.  Constitutional:      Appearance: Normal appearance. She is not toxic-appearing or diaphoretic.  HENT:     Head: Normocephalic and atraumatic.     Right Ear: External ear normal.     Left Ear: External ear normal.  Nose: Nose normal. No congestion or rhinorrhea.  Eyes:     General: No scleral icterus.    Extraocular Movements: Extraocular movements intact.     Conjunctiva/sclera: Conjunctivae normal.     Pupils: Pupils are equal, round, and reactive to light.  Cardiovascular:     Rate and Rhythm: Normal rate.     Heart sounds: No murmur heard.    No friction rub. No gallop.  Abdominal:     General: Bowel sounds are normal.     Palpations: Abdomen is soft.  Musculoskeletal:        General: No swelling, tenderness or deformity.     Cervical back: Normal range of motion and neck supple. No rigidity or tenderness.  Lymphadenopathy:     Head:     Right side of head: No submental, submandibular, tonsillar, preauricular, posterior auricular or occipital adenopathy.     Left side of head: No submental, submandibular, tonsillar, preauricular, posterior auricular or occipital adenopathy.     Cervical: No cervical adenopathy.     Right cervical: No superficial, deep or posterior cervical adenopathy.    Left cervical: No superficial, deep or posterior cervical adenopathy.     Upper Body:     Right upper body: No supraclavicular, axillary, pectoral or epitrochlear adenopathy.     Left upper body: No supraclavicular, axillary, pectoral or epitrochlear adenopathy.  Skin:    General: Skin is warm.     Coloration: Skin is not jaundiced.  Neurological:     General: No focal deficit present.     Mental Status: She is alert and oriented to person, place, and time. Mental status is at baseline.     Cranial Nerves: No cranial nerve deficit.  Psychiatric:        Mood and Affect: Mood normal.        Behavior: Behavior normal.         Thought Content: Thought content normal.        Judgment: Judgment normal.      LABORATORY DATA: I have personally reviewed the data as listed:  Appointment on 08/24/2022  Component Date Value Ref Range Status   Prothrombin Time 08/24/2022 14.1  11.4 - 15.2 seconds Final   INR 08/24/2022 1.1  0.8 - 1.2 Final   Comment: (NOTE) INR goal varies based on device and disease states. Performed at [facility], [address] [place], [place] [address]   Office Visit on 08/10/2022  Component Date Value Ref Range Status   A-1 Antitrypsin, Ser 08/10/2022 198 (H)  101 - 187 mg/dL Final   Comment: (NOTE) Performed At: [facility] [address] [place], [place] [id number] Jolene Schimke MD AO:[id number]    ds DNA Ab 08/10/2022 <1  0 - 9 IU/mL Final   Comment: (NOTE)                                   Negative      <5                                   Equivocal  5 - 9                                   Positive      >9  Ribonucleic Protein 08/10/2022 <0.2  0.0 - 0.9 AI Final   ENA SM Ab Ser-aCnc 08/10/2022 <0.2  0.0 - 0.9 AI Final   Scleroderma (Scl-70) (ENA) Antibod* 08/10/2022 <0.2  0.0 - 0.9 AI Final   SSA (Ro) (ENA) Antibody, IgG 08/10/2022 <0.2  0.0 - 0.9 AI Final   SSB (La) (ENA) Antibody, IgG 08/10/2022 <0.2  0.0 - 0.9 AI Final   Chromatin Ab SerPl-aCnc 08/10/2022 <0.2  0.0 - 0.9 AI Final   Anti JO-1 08/10/2022 <0.2  0.0 - 0.9 AI Final   Centromere Ab Screen 08/10/2022 0.4  0.0 - 0.9 AI Final   See below: 08/10/2022 Comment   Final   Comment: (NOTE) Autoantibody                       Disease Association ------------------------------------------------------------                        Condition                  Frequency ---------------------   ------------------------   --------- Antinuclear Antibody,    SLE, mixed connective Direct (ANA-D)           tissue diseases ---------------------   ------------------------   --------- dsDNA                     SLE                        40 - 60% ---------------------   ------------------------   --------- Chromatin                Drug induced SLE                90%                         SLE                        48 - 97% ---------------------   ------------------------   --------- SSA (Ro)                 SLE                        25 - 35%                         Sjogren's Syndrome         40 - 70%                         Neonatal Lupus                 100% ---------------------   ------------------------   --------- SSB (La)                 SLE                                                       10%  Sjogren's Syndrome              30% ---------------------   -----------------------    --------- Sm (anti-Smith)          SLE                        15 - 30% ---------------------   -----------------------    --------- RNP                      Mixed Connective Tissue                         Disease                         95% (U1 nRNP,                SLE                        30 - 50% anti-ribonucleoprotein)  Polymyositis and/or                         Dermatomyositis                 20% ---------------------   ------------------------   --------- Scl-70 (antiDNA          Scleroderma (diffuse)      20 - 35% topoisomerase)           Crest                           13% ---------------------   ------------------------   --------- Jo-1                     Polymyositis and/or                         Dermatomyositis            20 - 40% ---------------------   ------------------------   --------- Centromere B             Scleroderma -                           Crest                         variant                         80% Performed At: [facility] [address] [place], [place] [id number] Jolene Schimke MD WU:[id number]    Ceruloplasmin 08/10/2022 34.5  19.0 - 39.0 mg/dL Final   Comment: (NOTE) Performed At: [facility] [address]  [place], [place] [id number] Jolene Schimke MD VH:[id number]    CMV DNA Quant 08/10/2022 Negative  Negative IU/mL Final   Comment: (NOTE) No CMV DNA detected. The quantitative range of this assay is 200 to 1 million IU/mL. Performed At: [facility] [address] [place], [place] [id number] Jolene Schimke MD UV:[id number]    Log10 CMV Qn DNA Pl 08/10/2022 UNABLE TO CALCULATE  log10 IU/mL Final   Comment: (NOTE) Unable to calculate result since non-numeric result obtained for component test.    EBV  DNA QN by PCR 08/10/2022 Negative  Negative IU/mL Final   Comment: (NOTE) No EBV DNA detected. The linear range of this assay is 35 - 100,000,000 IU/mL Performed At: [facility] [address] 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 In symptomatic individuals, other causes of iron overload should be evaluated. See Additional Information and Comments. Additional Clinical Information: Hereditary hemochromatosis (HFE related) is an autosomal recessive iron storage disorder. Patients may have a genetic diagnosis of hereditary hemochromatosis and never show clinical symptoms. Clinical symptoms typically appear between 40 to 60 years in males and after menopause in females. Signs and symptoms may include organ damage, primarily in the liver, risk for hepatocellular carcinoma, diabetes, and heart disease due to iron accumulation. Life expectancy may be decreased in individuals who develop cirrhosis. Treatment for clinically symptomatic individuals may include therapeutic phl                          ebotomy. Liver transplant may be used to treat end stage  liver failure. For preventive care, monitoring for iron overload is recommended for patients who are homozygous for c.845G>A (p.Cys282Tyr) and have yet to experience clinical symptoms. Comments: The most common HFE variants associated with hereditary hemochromatosis are c.845G>A (p.Cys282Tyr), c.187C>G (p.His63Asp), c.193A>T (p.Ser65Cys). While patients homozygous for c.845G>A (p.Cys282Tyr) are the most likely to present clinical symptoms, less than 10% develop clinically significant iron overload with tissue and organ damage. Genetic counseling is recommended to discuss the potential clinical implications of positive results, as well as recommendations for testing family members. Genetic Coordinators are available for health care providers to discuss results at [phone]). Test Details: Three variants analyzed: c.845G>A (p.Cys282Tyr), commonly referred to as C282Y c.187C>G (p.His63Asp), commonly ref                          erred to as H63D c.193A>T (p.Ser65Cys), commonly referred to as S65C Methods/Limitations: DNA Analysis of the HFE gene (NM_[id number]) was performed by PCR amplification followed by restriction enzyme digestion analyses. Results must be combined with clinical information for the most accurate interpretation. Molecular-based testing is highly accurate, but as in any laboratory test, diagnostic errors may occur. False positive or false negative results may occur for reasons that include genetic variants, blood transfusions, bone marrow transplantation, somatic or tissue-specific mosaicism, mislabeled samples, or erroneous representation of family relationships. This test was developed and its performance characteristics determined by [facility]. It has not been cleared or approved by the Food and Drug Administration. References: Kennyth Arnold, [address] Kowdley Clarnce Flock LW, [place] AS; American Association for the Study of Liver Diseases. Diagnosis  and management of hemochromato                          sis: 2011 practice guideline by the American Association for the Study of Liver Diseases. Hepatology. 2011 Jul;54(1):328-43. doi: 10.1002/hep.24330. PMID: 74259563; PMCID: OVF6433295. [address] Brissot P, Swinkels DW, [employer], Kamarainen O, Patton S, Alonso I, Morris M, Keeney S. EMQN best practice guidelines for the molecular genetic diagnosis of hereditary hemochromatosis [facility]). Eur J Hum Genet. 2016 Apr;24(4):479-95. doi: 10.1038/ejhg.2015.128. Epub 2015 Jul 8. PMID:  95284132; PMCID: [id number].    Reviewed by: 08/10/2022 Comment   Final   Comment: (NOTE) Technical Component performed at [facility] Professional Component performed by: Elgie Collard, Ph.D., [facility] Director, Molecular Genetics [address] [place] [address] Performed At: [facility] [address] [place], [place] [id number] Maurine Simmering MDPhD LO:[id number]    Hepatitis B Surface Ag 08/10/2022 NON REACTIVE  NON REACTIVE Final   HCV Ab 08/10/2022 NON REACTIVE  NON REACTIVE Final   Comment: (NOTE) Nonreactive HCV antibody screen is consistent with no HCV infections,  unless recent infection is suspected or other evidence exists to indicate HCV infection.     Hep A IgM 08/10/2022 NON REACTIVE  NON REACTIVE Final   Hep B C IgM 08/10/2022 NON REACTIVE  NON REACTIVE Final   Performed at [facility], [address] [place], [place] [address]   Color, Urine 08/10/2022 YELLOW  YELLOW Final   APPearance 08/10/2022 CLEAR  CLEAR Final   Specific Gravity, Urine 08/10/2022 1.023  1.005 - 1.030 Final   pH 08/10/2022 6.0  5.0 - 8.0 Final   Glucose, UA 08/10/2022 >=500 (A)  NEGATIVE mg/dL Final   Hgb urine dipstick 08/10/2022 LARGE (A)  NEGATIVE Final   Bilirubin Urine 08/10/2022 NEGATIVE  NEGATIVE Final   Ketones, ur 08/10/2022 NEGATIVE  NEGATIVE mg/dL Final   Protein, ur 60/63/0160 NEGATIVE  NEGATIVE mg/dL Final   Nitrite 10/93/2355 NEGATIVE   NEGATIVE Final   Leukocytes,Ua 08/10/2022 NEGATIVE  NEGATIVE Final   Performed at [facility], [address] [place], [place] [address]  Appointment on 08/10/2022  Component Date Value Ref Range Status   IgG, Subclass 4 08/10/2022 66  2 - 96 mg/dL Final   Comment: (NOTE) Performed At: [facility] [address] [place], [place] [id number] Jolene Schimke MD JS:[id number]    LKM1 Ab 08/10/2022 1.8  0.0 - 20.0 Units Final   Comment: (NOTE)                                Negative    0.0 - 20.0                                Equivocal  20.1 - 24.9                                Positive         >24.9 LKM type 1 antibodies are detected in patients with autoimmune hepatitis type 2 and in up to 8% of patients with chronic HCV infection. Performed At: [facility] [address] [place], [place] [id number] Jolene Schimke MD RS:[id number]    [facility] test code 08/10/2022 [id number]   Final   [facility] test name 08/10/2022 Tissue Transglutaminase Abs,IgG,IgA   Final   Performed at [facility], [address] [place], [place] [address]   Misc [facility] result 08/10/2022 COMMENT   Final   Comment: (NOTE) Test Ordered: [id number] tTG IgA/G t-Transglutaminase (tTG) IgA   <2               U/mL     BN     Reference Range: 0-3  Negative        0 -  3                              Weak Positive   4 - 10                              Positive           >10 Tissue Transglutaminase (tTG) has been identified as the endomysial antigen.  Studies have demonstr- ated that endomysial IgA antibodies have over 99% specificity for gluten sensitive enteropathy. t-Transglutaminase (tTG) IgG   2                U/mL     BN     Reference Range: 0-5                                                                Negative        0 - 5                              Weak Positive   6 - 9                               Positive           >9 Performed At: [facility] [address] [place], [place] [id number] Jolene Schimke MD WU:[id number]    Prothrombin Time 08/10/2022 23.2 (H)  11.4 - 15.2 seconds Final   INR 08/10/2022 2.1 (H)  0.8 - 1.2 Final   Comment: (NOTE) INR goal varies based on device and disease states. Performed at [facility], [address] [place], [place] [address]     RADIOGRAPHIC STUDIES: I have personally reviewed the radiological images as listed and agree with the findings in the report  No results found.  ASSESSMENT/PLAN   47 y.o. female is here because of antiphospholipid antibody syndrome.  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  Imaging with CT angio and MRI demonstrated M1 occlusion with resultant nonhemorrhagic infarction of right temporal lobe, insular cortex basal ganglia  Cardiac imaging to locate an embolic source was negative.  Biochemical evaluation notable for Beta-2 glycoprotein IgG 109.   Antiphospholipid antibody syndrome: This is defined by the presence of antiphospholipid antibodies in the setting of thrombosis.  Most common sites of venous and arterial thrombosis in the lower limbs and cerebral circulation.  Patient appears to meet diagnostic criteria based on the presence of the beta-2 glycoprotein IgG which is strongly positive in the history of CVA. July 27 2022- Standard of care in terms of management is the use of warfarin with a goal INR of 2.0-3.0 long-term addition of low-dose aspirin is also a reasonable we will therefore transition patient back to Coumadin with flat dosing.  Discussed Coumadin management with patient  August 01 2022- Anti-cardiolipan antibody negative beta-2 glycoprotein  antibody IgG 127 IgA 24 IgM undetectable INR 2.2 PTT 54 .  Patient on Coumadin 6 mg daily  August 10 2022- Patient revealed that in November 2023 she was taking Rybelsus; this likely accounted for the large elevation in transaminases and difficulty in managing Coumadin dosing.  September 07 2022- INR 1.1.  Will increase coumdin dose to 9 mg daily.    Anemia  September 07 2022- Likely secondary to chronic blood loss exacerbated by anticoagulation Will arrange for IV iron   Cancer Staging  No matching staging information was found for the patient.   No problem-specific Assessment & Plan notes found for this encounter.    No orders of the defined types were placed in this encounter.   40  minutes was spent in patient care.  This included time spent preparing to see the patient (e.g., review of tests), obtaining and/or reviewing separately obtained history, counseling and educating the patient, ordering medications, tests, or procedures; documenting clinical information in the electronic or other health record, independently interpreting results and communicating results to the patient as well as coordination of care.       All questions were answered. The patient knows to call the clinic with any problems, questions or concerns.  This note was electronically signed.    Loni Muse, MD  09/07/2022 2:39 PM

## 2022-09-07 NOTE — Telephone Encounter (Signed)
Patient has been scheduled for follow-up visit per 09/07/22 LOS.  Pt given an appt calendar with date and time.

## 2022-09-07 NOTE — Patient Instructions (Signed)
Anytime you are going to start or stop a medication please notify us so that we can adjust your coumadin dose

## 2022-09-08 ENCOUNTER — Other Ambulatory Visit: Payer: Self-pay

## 2022-09-08 DIAGNOSIS — Z7901 Long term (current) use of anticoagulants: Secondary | ICD-10-CM

## 2022-09-08 DIAGNOSIS — R748 Abnormal levels of other serum enzymes: Secondary | ICD-10-CM

## 2022-09-08 DIAGNOSIS — D6861 Antiphospholipid syndrome: Secondary | ICD-10-CM

## 2022-09-09 ENCOUNTER — Inpatient Hospital Stay: Payer: No Typology Code available for payment source

## 2022-09-09 DIAGNOSIS — D6861 Antiphospholipid syndrome: Secondary | ICD-10-CM | POA: Diagnosis not present

## 2022-09-09 DIAGNOSIS — R748 Abnormal levels of other serum enzymes: Secondary | ICD-10-CM

## 2022-09-09 DIAGNOSIS — Z7901 Long term (current) use of anticoagulants: Secondary | ICD-10-CM

## 2022-09-09 LAB — FERRITIN: Ferritin: 5 ng/mL — ABNORMAL LOW (ref 11–307)

## 2022-09-09 LAB — VITAMIN B12: Vitamin B-12: 280 pg/mL (ref 180–914)

## 2022-09-09 LAB — FOLATE: Folate: 13.4 ng/mL (ref >5.9)

## 2022-09-12 ENCOUNTER — Encounter: Payer: Self-pay | Admitting: Oncology

## 2022-09-12 ENCOUNTER — Other Ambulatory Visit: Payer: Self-pay | Admitting: Pharmacist

## 2022-09-12 ENCOUNTER — Other Ambulatory Visit: Payer: Self-pay

## 2022-09-12 ENCOUNTER — Encounter: Payer: Self-pay | Admitting: Family Medicine

## 2022-09-12 DIAGNOSIS — Z7901 Long term (current) use of anticoagulants: Secondary | ICD-10-CM

## 2022-09-12 DIAGNOSIS — D539 Nutritional anemia, unspecified: Secondary | ICD-10-CM | POA: Insufficient documentation

## 2022-09-12 NOTE — Addendum Note (Signed)
Addended by: Domenic Schwab on: 09/12/2022 02:49 PM   Modules accepted: Orders

## 2022-09-12 NOTE — Progress Notes (Signed)
..  Feraheme orders changed to venofer due to insurance plan preference.  Message to scheduling has been sent.  

## 2022-09-13 ENCOUNTER — Other Ambulatory Visit: Payer: Self-pay

## 2022-09-13 ENCOUNTER — Telehealth: Payer: Self-pay

## 2022-09-13 ENCOUNTER — Telehealth: Payer: Self-pay | Admitting: Oncology

## 2022-09-13 ENCOUNTER — Encounter: Payer: Self-pay | Admitting: Oncology

## 2022-09-13 ENCOUNTER — Inpatient Hospital Stay: Payer: No Typology Code available for payment source

## 2022-09-13 DIAGNOSIS — E782 Mixed hyperlipidemia: Secondary | ICD-10-CM

## 2022-09-13 DIAGNOSIS — Z7901 Long term (current) use of anticoagulants: Secondary | ICD-10-CM

## 2022-09-13 DIAGNOSIS — D6861 Antiphospholipid syndrome: Secondary | ICD-10-CM | POA: Diagnosis not present

## 2022-09-13 LAB — PROTIME-INR
INR: 1.4 — ABNORMAL HIGH (ref 0.8–1.2)
Prothrombin Time: 16.8 seconds — ABNORMAL HIGH (ref 11.4–15.2)

## 2022-09-13 MED ORDER — ATORVASTATIN CALCIUM 80 MG PO TABS
80.0000 mg | ORAL_TABLET | Freq: Every day | ORAL | 1 refills | Status: DC
Start: 2022-09-13 — End: 2022-09-16

## 2022-09-13 MED FILL — Iron Sucrose Inj 20 MG/ML (Fe Equiv): INTRAVENOUS | Qty: 10 | Status: AC

## 2022-09-13 NOTE — Telephone Encounter (Signed)
-----   Message from Loni Muse, MD sent at 09/12/2022 12:45 PM EDT ----- I have written for patient to receive Feraheme x 2 doses.  Will need approval and scheduling Thanks

## 2022-09-13 NOTE — Telephone Encounter (Signed)
09/13/22 Spoke with patient and scheduled IV Iron 

## 2022-09-13 NOTE — Telephone Encounter (Signed)
Patient notified through MyChart message

## 2022-09-14 ENCOUNTER — Inpatient Hospital Stay: Payer: No Typology Code available for payment source

## 2022-09-14 VITALS — BP 115/61 | HR 80 | Resp 16 | Wt 192.2 lb

## 2022-09-14 DIAGNOSIS — D6861 Antiphospholipid syndrome: Secondary | ICD-10-CM | POA: Diagnosis not present

## 2022-09-14 DIAGNOSIS — D539 Nutritional anemia, unspecified: Secondary | ICD-10-CM

## 2022-09-14 MED ORDER — SODIUM CHLORIDE 0.9 % IV SOLN: Freq: Once | INTRAVENOUS | Status: AC

## 2022-09-14 MED ORDER — LORATADINE 10 MG PO TABS
10.0000 mg | ORAL_TABLET | Freq: Once | ORAL | Status: AC
  Administered 2022-09-14: 10 mg via ORAL
  Filled 2022-09-14: qty 1

## 2022-09-14 MED ORDER — ACETAMINOPHEN 325 MG PO TABS
650.0000 mg | ORAL_TABLET | Freq: Once | ORAL | Status: AC
  Administered 2022-09-14: 650 mg via ORAL
  Filled 2022-09-14: qty 2

## 2022-09-14 MED ORDER — SODIUM CHLORIDE 0.9 % IV SOLN
200.0000 mg | Freq: Once | INTRAVENOUS | Status: AC
  Administered 2022-09-14: 200 mg via INTRAVENOUS
  Filled 2022-09-14: qty 200

## 2022-09-14 NOTE — Patient Instructions (Signed)

## 2022-09-15 MED FILL — Iron Sucrose Inj 20 MG/ML (Fe Equiv): INTRAVENOUS | Qty: 10 | Status: AC

## 2022-09-16 ENCOUNTER — Inpatient Hospital Stay: Payer: No Typology Code available for payment source

## 2022-09-16 ENCOUNTER — Other Ambulatory Visit: Payer: Self-pay

## 2022-09-16 VITALS — BP 118/64 | HR 84 | Temp 98.6°F | Resp 18 | Ht 62.0 in | Wt 192.0 lb

## 2022-09-16 DIAGNOSIS — Z7901 Long term (current) use of anticoagulants: Secondary | ICD-10-CM

## 2022-09-16 DIAGNOSIS — E782 Mixed hyperlipidemia: Secondary | ICD-10-CM

## 2022-09-16 DIAGNOSIS — D6861 Antiphospholipid syndrome: Secondary | ICD-10-CM | POA: Diagnosis not present

## 2022-09-16 DIAGNOSIS — D539 Nutritional anemia, unspecified: Secondary | ICD-10-CM

## 2022-09-16 LAB — PROTIME-INR
INR: 1.5 — ABNORMAL HIGH (ref 0.8–1.2)
Prothrombin Time: 17.6 seconds — ABNORMAL HIGH (ref 11.4–15.2)

## 2022-09-16 MED ORDER — ACETAMINOPHEN 325 MG PO TABS
650.0000 mg | ORAL_TABLET | Freq: Once | ORAL | Status: AC
  Administered 2022-09-16: 650 mg via ORAL
  Filled 2022-09-16: qty 2

## 2022-09-16 MED ORDER — SODIUM CHLORIDE 0.9 % IV SOLN: Freq: Once | INTRAVENOUS | Status: AC

## 2022-09-16 MED ORDER — ATORVASTATIN CALCIUM 80 MG PO TABS
80.0000 mg | ORAL_TABLET | Freq: Every day | ORAL | 1 refills | Status: DC
Start: 2022-09-16 — End: 2023-10-23

## 2022-09-16 MED ORDER — SODIUM CHLORIDE 0.9 % IV SOLN
200.0000 mg | Freq: Once | INTRAVENOUS | Status: AC
  Administered 2022-09-16: 200 mg via INTRAVENOUS
  Filled 2022-09-16: qty 200

## 2022-09-16 MED ORDER — LORATADINE 10 MG PO TABS
10.0000 mg | ORAL_TABLET | Freq: Once | ORAL | Status: AC
  Administered 2022-09-16: 10 mg via ORAL
  Filled 2022-09-16: qty 1

## 2022-09-16 MED FILL — Iron Sucrose Inj 20 MG/ML (Fe Equiv): INTRAVENOUS | Qty: 10 | Status: AC

## 2022-09-16 NOTE — Patient Instructions (Signed)

## 2022-09-19 ENCOUNTER — Inpatient Hospital Stay: Payer: No Typology Code available for payment source

## 2022-09-19 ENCOUNTER — Other Ambulatory Visit: Payer: Self-pay

## 2022-09-19 VITALS — BP 111/73 | HR 82 | Temp 98.0°F | Resp 16

## 2022-09-19 DIAGNOSIS — D539 Nutritional anemia, unspecified: Secondary | ICD-10-CM

## 2022-09-19 DIAGNOSIS — Z7901 Long term (current) use of anticoagulants: Secondary | ICD-10-CM

## 2022-09-19 DIAGNOSIS — D6861 Antiphospholipid syndrome: Secondary | ICD-10-CM | POA: Diagnosis not present

## 2022-09-19 MED ORDER — SODIUM CHLORIDE 0.9 % IV SOLN: Freq: Once | INTRAVENOUS | Status: AC

## 2022-09-19 MED ORDER — ACETAMINOPHEN 325 MG PO TABS
650.0000 mg | ORAL_TABLET | Freq: Once | ORAL | Status: AC
  Administered 2022-09-19: 650 mg via ORAL
  Filled 2022-09-19: qty 2

## 2022-09-19 MED ORDER — SODIUM CHLORIDE 0.9 % IV SOLN
200.0000 mg | Freq: Once | INTRAVENOUS | Status: AC
  Administered 2022-09-19: 200 mg via INTRAVENOUS
  Filled 2022-09-19: qty 200

## 2022-09-19 MED ORDER — LORATADINE 10 MG PO TABS
10.0000 mg | ORAL_TABLET | Freq: Once | ORAL | Status: AC
  Administered 2022-09-19: 10 mg via ORAL
  Filled 2022-09-19: qty 1

## 2022-09-19 NOTE — Patient Instructions (Signed)

## 2022-09-21 ENCOUNTER — Encounter: Payer: Self-pay | Admitting: Family Medicine

## 2022-09-21 ENCOUNTER — Encounter: Payer: Self-pay | Admitting: Oncology

## 2022-09-21 DIAGNOSIS — M21372 Foot drop, left foot: Secondary | ICD-10-CM | POA: Diagnosis not present

## 2022-09-22 ENCOUNTER — Inpatient Hospital Stay: Payer: No Typology Code available for payment source | Attending: Oncology

## 2022-09-22 DIAGNOSIS — D539 Nutritional anemia, unspecified: Secondary | ICD-10-CM | POA: Insufficient documentation

## 2022-09-22 DIAGNOSIS — Z7901 Long term (current) use of anticoagulants: Secondary | ICD-10-CM

## 2022-09-22 LAB — PROTIME-INR
INR: 1.3 — ABNORMAL HIGH (ref 0.8–1.2)
Prothrombin Time: 15.6 seconds — ABNORMAL HIGH (ref 11.4–15.2)

## 2022-09-22 MED FILL — Iron Sucrose Inj 20 MG/ML (Fe Equiv): INTRAVENOUS | Qty: 10 | Status: AC

## 2022-09-23 ENCOUNTER — Inpatient Hospital Stay: Payer: No Typology Code available for payment source

## 2022-09-23 ENCOUNTER — Encounter: Payer: Self-pay | Admitting: Oncology

## 2022-09-23 VITALS — BP 111/61 | HR 87 | Temp 97.9°F | Resp 14

## 2022-09-23 DIAGNOSIS — D539 Nutritional anemia, unspecified: Secondary | ICD-10-CM

## 2022-09-23 MED ORDER — SODIUM CHLORIDE 0.9 % IV SOLN: Freq: Once | INTRAVENOUS | Status: DC

## 2022-09-23 MED ORDER — SODIUM CHLORIDE 0.9 % IV SOLN
200.0000 mg | Freq: Once | INTRAVENOUS | Status: AC
  Administered 2022-09-23: 200 mg via INTRAVENOUS
  Filled 2022-09-23: qty 200

## 2022-09-23 MED ORDER — LORATADINE 10 MG PO TABS
10.0000 mg | ORAL_TABLET | Freq: Once | ORAL | Status: AC
  Administered 2022-09-23: 10 mg via ORAL
  Filled 2022-09-23: qty 1

## 2022-09-23 MED ORDER — ACETAMINOPHEN 325 MG PO TABS
650.0000 mg | ORAL_TABLET | Freq: Once | ORAL | Status: AC
  Administered 2022-09-23: 650 mg via ORAL
  Filled 2022-09-23: qty 2

## 2022-09-23 MED FILL — Iron Sucrose Inj 20 MG/ML (Fe Equiv): INTRAVENOUS | Qty: 10 | Status: AC

## 2022-09-23 NOTE — Addendum Note (Signed)
Addended by: Domenic Schwab on: 09/23/2022 03:01 PM   Modules accepted: Orders

## 2022-09-23 NOTE — Patient Instructions (Signed)

## 2022-09-26 ENCOUNTER — Inpatient Hospital Stay: Payer: No Typology Code available for payment source

## 2022-09-30 ENCOUNTER — Inpatient Hospital Stay: Payer: No Typology Code available for payment source

## 2022-09-30 VITALS — BP 101/61 | HR 70 | Temp 97.5°F | Resp 16

## 2022-09-30 DIAGNOSIS — D539 Nutritional anemia, unspecified: Secondary | ICD-10-CM

## 2022-09-30 DIAGNOSIS — Z7901 Long term (current) use of anticoagulants: Secondary | ICD-10-CM

## 2022-09-30 LAB — PROTIME-INR
INR: 4.7 (ref 0.8–1.2)
Prothrombin Time: 44.5 seconds — ABNORMAL HIGH (ref 11.4–15.2)

## 2022-09-30 MED ORDER — ACETAMINOPHEN 325 MG PO TABS
650.0000 mg | ORAL_TABLET | Freq: Once | ORAL | Status: AC
  Administered 2022-09-30: 650 mg via ORAL
  Filled 2022-09-30: qty 2

## 2022-09-30 MED ORDER — LORATADINE 10 MG PO TABS
10.0000 mg | ORAL_TABLET | Freq: Once | ORAL | Status: AC
  Administered 2022-09-30: 10 mg via ORAL
  Filled 2022-09-30: qty 1

## 2022-09-30 MED ORDER — SODIUM CHLORIDE 0.9 % IV SOLN: Freq: Once | INTRAVENOUS | Status: AC

## 2022-09-30 MED ORDER — SODIUM CHLORIDE 0.9 % IV SOLN
200.0000 mg | Freq: Once | INTRAVENOUS | Status: AC
  Administered 2022-09-30: 200 mg via INTRAVENOUS
  Filled 2022-09-30: qty 200

## 2022-09-30 NOTE — Patient Instructions (Signed)

## 2022-10-03 ENCOUNTER — Telehealth: Payer: Self-pay

## 2022-10-03 NOTE — Telephone Encounter (Signed)
Pt called after hour nurse line (@ [phone]) to report that her INR was 4.7 on Friday. Pt is currently taking coumadin 12mg  po qd. She continued to take it over the weekend. Please advise.

## 2022-10-06 ENCOUNTER — Inpatient Hospital Stay: Payer: No Typology Code available for payment source

## 2022-10-07 ENCOUNTER — Inpatient Hospital Stay: Payer: No Typology Code available for payment source

## 2022-10-07 DIAGNOSIS — D539 Nutritional anemia, unspecified: Secondary | ICD-10-CM | POA: Diagnosis not present

## 2022-10-07 DIAGNOSIS — Z7901 Long term (current) use of anticoagulants: Secondary | ICD-10-CM

## 2022-10-07 LAB — PROTIME-INR
INR: 1.2 (ref 0.8–1.2)
Prothrombin Time: 15.5 seconds — ABNORMAL HIGH (ref 11.4–15.2)

## 2022-10-10 ENCOUNTER — Inpatient Hospital Stay: Payer: No Typology Code available for payment source

## 2022-10-14 ENCOUNTER — Inpatient Hospital Stay: Payer: No Typology Code available for payment source

## 2022-10-14 DIAGNOSIS — Z7901 Long term (current) use of anticoagulants: Secondary | ICD-10-CM

## 2022-10-14 DIAGNOSIS — D539 Nutritional anemia, unspecified: Secondary | ICD-10-CM | POA: Diagnosis not present

## 2022-10-14 LAB — PROTIME-INR
INR: 2.9 — ABNORMAL HIGH (ref 0.8–1.2)
Prothrombin Time: 30.6 seconds — ABNORMAL HIGH (ref 11.4–15.2)

## 2022-11-02 ENCOUNTER — Other Ambulatory Visit: Payer: Self-pay

## 2022-11-02 MED ORDER — WARFARIN SODIUM 3 MG PO TABS: 6.0000 mg | ORAL_TABLET | Freq: Every day | ORAL | 0 refills | Status: DC

## 2022-11-07 ENCOUNTER — Inpatient Hospital Stay: Payer: No Typology Code available for payment source | Attending: Oncology

## 2022-11-07 DIAGNOSIS — Z7901 Long term (current) use of anticoagulants: Secondary | ICD-10-CM

## 2022-11-07 DIAGNOSIS — D6861 Antiphospholipid syndrome: Secondary | ICD-10-CM | POA: Insufficient documentation

## 2022-11-07 DIAGNOSIS — D539 Nutritional anemia, unspecified: Secondary | ICD-10-CM | POA: Diagnosis not present

## 2022-11-07 LAB — PROTIME-INR
INR: 3.2 — ABNORMAL HIGH (ref 0.8–1.2)
Prothrombin Time: 32.7 seconds — ABNORMAL HIGH (ref 11.4–15.2)

## 2022-11-21 ENCOUNTER — Other Ambulatory Visit: Payer: Self-pay

## 2022-11-21 DIAGNOSIS — E669 Obesity, unspecified: Secondary | ICD-10-CM

## 2022-11-21 MED ORDER — DAPAGLIFLOZIN PROPANEDIOL 5 MG PO TABS
5.0000 mg | ORAL_TABLET | Freq: Every day | ORAL | 0 refills | Status: DC
Start: 2022-11-21 — End: 2022-12-02

## 2022-11-22 ENCOUNTER — Telehealth: Payer: Self-pay

## 2022-11-22 NOTE — Telephone Encounter (Signed)
Shannon Erickson from [facility] called in regards to patient not picking up her refills on her prescription for Atorvastatin 80 mg. They state per the pharmacy team they would like for our office and or provider to call the patient to stress the importance of medication adherence. I told karen that we don't reach out to the patient to tell her to take her medications that the patient is scheduled to come in to see [place] on 12/02/22 for fasting follow up and it can be discussed then at her regular appointment. Shannon Erickson then stated that ok she would document and let her pharmacy team know and she said they can't get a hold of the patient to inform her so we would need to pass along the message. This is for a FYI in regards to patient's next appointment.

## 2022-12-01 NOTE — Progress Notes (Unsigned)
Subjective:  Patient ID: Shannon Erickson, female    DOB: [birth year]  Age: 47 y.o. MRN: [id number]  Chief Complaint  Patient presents with   Medical Management of Chronic Issues    HPI   Hyperlipidemia: On Atorvastatin 80 mg daily without problems.    Hypertension: no antihypertensives. Patient is working on maintaining diet and exercise regimen and follows up as directed.   DMII: taking farxiga 5 mg daily, checks feet daily, does not check FBS, due for eye exam, but did have a normal retinal screening in our office in 01/2022. A1C 6.8.  Losartan 25 mg daily was recommended 08/03/22-patient did not inform us that she was willing to start on it, so no prescription was senr.   Seizure disorder: She is taking Keppra 750 mg twice a day. No seizures.  Sees [facility].  Dr.Sethi.   Antiphospholipid syndrome: on warfarin 6 mg daily. Patient was referred to hematology for management. Patient was noncompliant with coming monthly. We considered changing her to a NOAC, but will defer to Dr. Constance Goltz. Has appt 12/06/22 with him.   Depression/Anxiety: on lexapro 10 mg daily.   Declined colonoscopy at this time.     12/02/2022   10:42 AM 08/03/2022   10:35 AM 12/29/2021   10:15 AM 02/02/2021    1:12 PM 12/11/2019   11:21 AM  Depression screen PHQ 2/9  Decreased Interest 1 0 0 0 0  Down, Depressed, Hopeless 0 0 0 0 0  PHQ - 2 Score 1 0 0 0 0  Altered sleeping 1      Tired, decreased energy 1      Change in appetite 0      Feeling bad or failure about yourself  0      Trouble concentrating 1      Moving slowly or fidgety/restless 0      Suicidal thoughts 0      PHQ-9 Score 4      Difficult doing work/chores Somewhat difficult            12/02/2022   10:42 AM  Fall Risk   Falls in the past year? 0  Number falls in past yr: 0  Injury with Fall? 0  Risk for fall due to : Impaired balance/gait  Follow up Falls evaluation completed    Patient Care Team: Blane Ohara, MD as  PCP - General (Family Medicine) Loni Muse, MD as Consulting Physician (Internal Medicine)   Review of Systems  Constitutional:  Negative for chills, fatigue and fever.  HENT:  Negative for congestion, ear pain, rhinorrhea and sore throat.   Respiratory:  Negative for cough and shortness of breath.   Cardiovascular:  Negative for chest pain.  Gastrointestinal:  Negative for abdominal pain, constipation, diarrhea, nausea and vomiting.  Genitourinary:  Negative for dysuria and urgency.  Musculoskeletal:  Negative for back pain and myalgias.  Neurological:  Positive for weakness (Baseline). Negative for dizziness, light-headedness and headaches.  Psychiatric/Behavioral:  Negative for dysphoric mood. The patient is not nervous/anxious.     Current Outpatient Medications on File Prior to Visit  Medication Sig Dispense Refill   atorvastatin (LIPITOR) 80 MG tablet Take 1 tablet (80 mg total) by mouth daily. 90 tablet 1   escitalopram (LEXAPRO) 10 MG tablet Take 1 tablet (10 mg total) by mouth daily. 30 tablet 3   levETIRAcetam (KEPPRA) 750 MG tablet Take 1 tablet (750 mg total) by mouth 2 (two) times daily. 60 tablet 1  nitroGLYCERIN (NITROSTAT) 0.4 MG SL tablet Place 1 tablet under the tongue every 5 (five) minutes as needed.  0   warfarin (COUMADIN) 3 MG tablet Take 2 tablets (6 mg total) by mouth daily. 60 tablet 0   No current facility-administered medications on file prior to visit.   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/10/2016   Past Surgical History:  Procedure Laterality Date   CESAREAN SECTION Bilateral 1995, 2002   CHOLECYSTECTOMY     LYMPH NODE BIOPSY     MOUTH SURGERY Left 07/04/2021   NO PAST SURGERIES     TEE WITHOUT CARDIOVERSION N/A 12/05/2016   Procedure: TRANSESOPHAGEAL ECHOCARDIOGRAM (TEE);   Surgeon: Elease Hashimoto, Deloris Ping, MD;  Location: [facility] ENDOSCOPY;  Service: Cardiovascular;  Laterality: N/A;    Family History  Problem Relation Age of Onset   Lung cancer Mother        mets to brain, bone, liver   Hypertension Father    Heart attack Brother        at age 19   Renal cancer Maternal Grandfather    Heart attack Paternal Grandfather    Social History   Socioeconomic History   Marital status: Single    Spouse name: Not on file   Number of children: 3   Years of education: 12   Highest education level: GED or equivalent  Occupational History   Not on file  Tobacco Use   Smoking status: Never   Smokeless tobacco: Never  Vaping Use   Vaping status: Never Used  Substance and Sexual Activity   Alcohol use: No   Drug use: No   Sexual activity: Not Currently    Birth control/protection: Other-see comments  Other Topics Concern   Not on file  Social History Narrative   Not on file   Social Determinants of Health   Financial Resource Strain: Medium Risk (12/01/2022)   Overall Financial Resource Strain (CARDIA)    Difficulty of Paying Living Expenses: Somewhat hard  Food Insecurity: Food Insecurity Present (12/01/2022)   Hunger Vital Sign    Worried About Running Out of Food in the Last Year: Sometimes true    Ran Out of Food in the Last Year: Sometimes true  Transportation Needs: No Transportation Needs (12/01/2022)   PRAPARE - Administrator, Civil Service (Medical): No    Lack of Transportation (Non-Medical): No  Physical Activity: Insufficiently Active (12/01/2022)   Exercise Vital Sign    Days of Exercise per Week: 5 days    Minutes of Exercise per Session: 10 min  Stress: Stress Concern Present (12/01/2022)   [employer] of Occupational Health - Occupational Stress Questionnaire    Feeling of Stress : To some extent  Social Connections: Moderately Integrated (12/01/2022)   Social Connection and Isolation Panel [NHANES]    Frequency of  Communication with Friends and Family: More than three times a week    Frequency of Social Gatherings with Friends and Family: Twice a week    Attends Religious Services: More than 4 times per year    Active Member of [employer] or Organizations: Yes    Attends Engineer, structural: More than 4 times per year    Marital Status: Divorced    Objective:  BP 130/68  Pulse 83   Temp (!) 97.2 F (36.2 C)   Ht 5\' 3"  (1.6 m)   Wt 197 lb (89.4 kg)   SpO2 98%   BMI 34.90 kg/m      12/02/2022   10:38 AM 09/30/2022    4:17 PM 09/30/2022    3:00 PM  BP/Weight  Systolic BP 130 101 134  Diastolic BP 68 61 74  Wt. (Lbs) 197    BMI 34.9 kg/m2      Physical Exam Vitals reviewed.  Constitutional:      Appearance: Normal appearance. She is normal weight.  Neck:     Vascular: No carotid bruit.  Cardiovascular:     Rate and Rhythm: Normal rate and regular rhythm.     Heart sounds: Normal heart sounds.  Pulmonary:     Effort: Pulmonary effort is normal. No respiratory distress.     Breath sounds: Normal breath sounds.  Abdominal:     General: Abdomen is flat. Bowel sounds are normal.     Palpations: Abdomen is soft.     Tenderness: There is no abdominal tenderness.  Neurological:     Mental Status: She is alert and oriented to person, place, and time.     Motor: Weakness (left hand and arm.) present.     Gait: Gait abnormal (requires forehand crutches.).  Psychiatric:        Mood and Affect: Mood normal.        Behavior: Behavior normal.     Diabetic Foot Exam - Simple   Simple Foot Form Diabetic Foot exam was performed with the following findings: Yes 12/02/2022 11:12 AM  Visual Inspection No deformities, no ulcerations, no other skin breakdown bilaterally: Yes Sensation Testing Intact to touch and monofilament testing bilaterally: Yes Pulse Check Posterior Tibialis and Dorsalis pulse intact bilaterally: Yes Comments      Lab Results  Component Value Date   WBC 7.3  12/02/2022   HGB 14.8 12/02/2022   HCT 47.0 (H) 12/02/2022   PLT 268 12/02/2022   GLUCOSE 92 12/02/2022   CHOL 156 12/02/2022   TRIG 106 12/02/2022   HDL 51 12/02/2022   LDLCALC 86 12/02/2022   ALT 110 (H) 12/02/2022   AST 71 (H) 12/02/2022   NA 139 12/02/2022   K 4.0 12/02/2022   CL 102 12/02/2022   CREATININE 0.76 12/02/2022   BUN 14 12/02/2022   CO2 24 12/02/2022   TSH 1.150 12/14/2021   INR 3.2 (H) 11/07/2022   HGBA1C 6.1 (H) 12/02/2022   MICROALBUR 30 03/22/2021      Assessment & Plan:    Seizure disorder [facility]) Assessment & Plan: Management per specialist.  The current medical regimen is effective;  continue present plan and medications. Keppra and gabapentin.   Mixed hyperlipidemia Assessment & Plan: Well controlled.  Continue Lipitor 80 mg. Continue to work on eating a healthy diet and exercise.  Labs drawn today.    Orders: -     Lipid panel  Antiphospholipid syndrome (HCC) Assessment & Plan: Continue warfarin 6 mg daily.  Management per specialist.      Hypertension associated with diabetes (HCC) Assessment & Plan: Controlled with diet and exercise.  Labs reviewed that were done recently. Continue farxiga will increase to 10 mg daily.  Orders: -     CBC with Differential/Platelet -     Comprehensive metabolic panel -     Hemoglobin A1c -     Microalbumin / creatinine urine ratio  Hemiparesis affecting left side  as late effect of cerebrovascular accident [facility]) Assessment & Plan: Continue current medications. Continue physical therapy at home.     Obesity, diabetes, and hypertension syndrome (HCC) Assessment & Plan: Control: Controlled Recommend check sugars fasting daily. Recommend check feet daily. Recommend annual eye exams. Medicines:  taking farxiga 5 mg daily  Continue to work on eating a healthy diet and exercise.  Labs drawn today.     Orders: -     Dapagliflozin Propanediol; Take 1 tablet (10 mg total) by mouth daily  before breakfast.  Dispense: 90 tablet; Refill: 0     Meds ordered this encounter  Medications   dapagliflozin propanediol (FARXIGA) 10 MG TABS tablet    Sig: Take 1 tablet (10 mg total) by mouth daily before breakfast.    Dispense:  90 tablet    Refill:  0    Orders Placed This Encounter  Procedures   CBC with Differential/Platelet   Comprehensive metabolic panel   Lipid panel   Hemoglobin A1c   Microalbumin / creatinine urine ratio     Follow-up: Return in about 3 months (around 03/04/2023) for chronic, fasting.   I,Katherina A Bramblett,acting as a scribe for Blane Ohara, MD.,have documented all relevant documentation on the behalf of Blane Ohara, MD,as directed by  Blane Ohara, MD while in the presence of Blane Ohara, MD.   Clayborn Bigness I Leal-Borjas,acting as a scribe for Blane Ohara, MD.,have documented all relevant documentation on the behalf of Blane Ohara, MD,as directed by  Blane Ohara, MD while in the presence of Blane Ohara, MD.    An After Visit Summary was printed and given to the patient.  Blane Ohara, MD [facility] [phone]

## 2022-12-02 ENCOUNTER — Ambulatory Visit: Payer: No Typology Code available for payment source | Admitting: Family Medicine

## 2022-12-02 ENCOUNTER — Encounter: Payer: Self-pay | Admitting: Family Medicine

## 2022-12-02 VITALS — BP 130/68 | HR 83 | Temp 97.2°F | Ht 63.0 in | Wt 197.0 lb

## 2022-12-02 DIAGNOSIS — E1169 Type 2 diabetes mellitus with other specified complication: Secondary | ICD-10-CM | POA: Diagnosis not present

## 2022-12-02 DIAGNOSIS — Z7984 Long term (current) use of oral hypoglycemic drugs: Secondary | ICD-10-CM | POA: Diagnosis not present

## 2022-12-02 DIAGNOSIS — G40909 Epilepsy, unspecified, not intractable, without status epilepticus: Secondary | ICD-10-CM | POA: Diagnosis not present

## 2022-12-02 DIAGNOSIS — D6861 Antiphospholipid syndrome: Secondary | ICD-10-CM | POA: Diagnosis not present

## 2022-12-02 DIAGNOSIS — I152 Hypertension secondary to endocrine disorders: Secondary | ICD-10-CM

## 2022-12-02 DIAGNOSIS — I69354 Hemiplegia and hemiparesis following cerebral infarction affecting left non-dominant side: Secondary | ICD-10-CM | POA: Diagnosis not present

## 2022-12-02 DIAGNOSIS — E669 Obesity, unspecified: Secondary | ICD-10-CM

## 2022-12-02 DIAGNOSIS — E1159 Type 2 diabetes mellitus with other circulatory complications: Secondary | ICD-10-CM

## 2022-12-02 DIAGNOSIS — E782 Mixed hyperlipidemia: Secondary | ICD-10-CM

## 2022-12-02 MED ORDER — DAPAGLIFLOZIN PROPANEDIOL 10 MG PO TABS
10.0000 mg | ORAL_TABLET | Freq: Every day | ORAL | 0 refills | Status: AC
Start: 2022-12-02 — End: ?

## 2022-12-02 NOTE — Assessment & Plan Note (Addendum)
Control: Controlled Recommend check sugars fasting daily. Recommend check feet daily. Recommend annual eye exams. Medicines:  taking farxiga 5 mg daily  Continue to work on eating a healthy diet and exercise.  Labs drawn today.

## 2022-12-02 NOTE — Assessment & Plan Note (Signed)
Continue warfarin 6 mg daily.  Management per specialist.    

## 2022-12-02 NOTE — Assessment & Plan Note (Addendum)
Controlled with diet and exercise.  Labs reviewed that were done recently. Continue farxiga will increase to 10 mg daily.

## 2022-12-02 NOTE — Assessment & Plan Note (Signed)
Well controlled.  Continue Lipitor 80 mg. Continue to work on eating a healthy diet and exercise.  Labs drawn today.

## 2022-12-02 NOTE — Assessment & Plan Note (Signed)
Management per specialist.  The current medical regimen is effective;  continue present plan and medications. Keppra and gabapentin. 

## 2022-12-02 NOTE — Assessment & Plan Note (Signed)
Continue current medications. Continue physical therapy  

## 2022-12-03 LAB — CBC WITH DIFFERENTIAL/PLATELET
Basophils Absolute: 0 10*3/uL (ref 0.0–0.2)
Basos: 1 %
EOS (ABSOLUTE): 0.1 10*3/uL (ref 0.0–0.4)
Eos: 1 %
Hematocrit: 47 % — ABNORMAL HIGH (ref 34.0–46.6)
Hemoglobin: 14.8 g/dL (ref 11.1–15.9)
Immature Grans (Abs): 0 10*3/uL (ref 0.0–0.1)
Immature Granulocytes: 0 %
Lymphocytes Absolute: 2.3 10*3/uL (ref 0.7–3.1)
Lymphs: 32 %
MCH: 26 pg — ABNORMAL LOW (ref 26.6–33.0)
MCHC: 31.5 g/dL (ref 31.5–35.7)
MCV: 83 fL (ref 79–97)
Monocytes Absolute: 0.5 10*3/uL (ref 0.1–0.9)
Monocytes: 7 %
Neutrophils Absolute: 4.3 10*3/uL (ref 1.4–7.0)
Neutrophils: 59 %
Platelets: 268 10*3/uL (ref 150–450)
RBC: 5.7 x10E6/uL — ABNORMAL HIGH (ref 3.77–5.28)
RDW: 21.8 % — ABNORMAL HIGH (ref 11.7–15.4)
WBC: 7.3 10*3/uL (ref 3.4–10.8)

## 2022-12-03 LAB — LIPID PANEL
Chol/HDL Ratio: 3.1 ratio (ref 0.0–4.4)
Cholesterol, Total: 156 mg/dL (ref 100–199)
HDL: 51 mg/dL (ref >39)
LDL Chol Calc (NIH): 86 mg/dL (ref 0–99)
Triglycerides: 106 mg/dL (ref 0–149)
VLDL Cholesterol Cal: 19 mg/dL (ref 5–40)

## 2022-12-03 LAB — COMPREHENSIVE METABOLIC PANEL
ALT: 110 IU/L — ABNORMAL HIGH (ref 0–32)
AST: 71 IU/L — ABNORMAL HIGH (ref 0–40)
Albumin: 3.4 g/dL — ABNORMAL LOW (ref 3.9–4.9)
Alkaline Phosphatase: 186 IU/L — ABNORMAL HIGH (ref 44–121)
BUN/Creatinine Ratio: 18 (ref 9–23)
BUN: 14 mg/dL (ref 6–24)
Bilirubin Total: 0.5 mg/dL (ref 0.0–1.2)
CO2: 24 mmol/L (ref 20–29)
Calcium: 8.2 mg/dL — ABNORMAL LOW (ref 8.7–10.2)
Chloride: 102 mmol/L (ref 96–106)
Creatinine, Ser: 0.76 mg/dL (ref 0.57–1.00)
Globulin, Total: 3.6 g/dL (ref 1.5–4.5)
Glucose: 92 mg/dL (ref 70–99)
Potassium: 4 mmol/L (ref 3.5–5.2)
Sodium: 139 mmol/L (ref 134–144)
Total Protein: 7 g/dL (ref 6.0–8.5)
eGFR: 98 mL/min/{1.73_m2} (ref >59)

## 2022-12-03 LAB — MICROALBUMIN / CREATININE URINE RATIO
Creatinine, Urine: 88.1 mg/dL
Microalb/Creat Ratio: 14 mg/g creat (ref 0–29)
Microalbumin, Urine: 12.4 ug/mL

## 2022-12-03 LAB — HEMOGLOBIN A1C
Est. average glucose Bld gHb Est-mCnc: 128 mg/dL
Hgb A1c MFr Bld: 6.1 % — ABNORMAL HIGH (ref 4.8–5.6)

## 2022-12-07 ENCOUNTER — Inpatient Hospital Stay: Payer: No Typology Code available for payment source

## 2022-12-07 ENCOUNTER — Inpatient Hospital Stay: Payer: No Typology Code available for payment source | Attending: Oncology | Admitting: Oncology

## 2022-12-07 VITALS — BP 125/60 | HR 79 | Temp 97.7°F | Resp 16 | Ht 63.0 in | Wt 199.2 lb

## 2022-12-07 DIAGNOSIS — D6861 Antiphospholipid syndrome: Secondary | ICD-10-CM | POA: Insufficient documentation

## 2022-12-07 DIAGNOSIS — D539 Nutritional anemia, unspecified: Secondary | ICD-10-CM | POA: Diagnosis not present

## 2022-12-07 DIAGNOSIS — Z7901 Long term (current) use of anticoagulants: Secondary | ICD-10-CM

## 2022-12-07 DIAGNOSIS — I63511 Cerebral infarction due to unspecified occlusion or stenosis of right middle cerebral artery: Secondary | ICD-10-CM

## 2022-12-07 DIAGNOSIS — Z79899 Other long term (current) drug therapy: Secondary | ICD-10-CM | POA: Diagnosis not present

## 2022-12-07 DIAGNOSIS — D649 Anemia, unspecified: Secondary | ICD-10-CM | POA: Diagnosis not present

## 2022-12-07 LAB — PROTIME-INR
INR: 3.5 — ABNORMAL HIGH (ref 0.8–1.2)
Prothrombin Time: 35.3 seconds — ABNORMAL HIGH (ref 11.4–15.2)

## 2022-12-07 LAB — FERRITIN: Ferritin: 12 ng/mL (ref 11–307)

## 2022-12-07 NOTE — Progress Notes (Signed)
[facility] Cancer Follow up Visit:  Patient Care Team: Blane Ohara, MD as PCP - General (Family Medicine) Loni Muse, MD as Consulting Physician (Internal Medicine)  CHIEF COMPLAINTS/PURPOSE OF CONSULTATION:  HISTORY OF PRESENTING ILLNESS: Shannon Erickson 47 y.o. 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 Migraines  December 03 2016: Presented to [facility] health emergency department with right sided headache.   CT head without contrast negative for hemorrhage MRI head without contrast negative for hemorrhage but suspected right MCA M1 stenosis or occlusion CT angiogram showed proximal right M1 occlusion.  Evolution of right temporal lobe, insular cortex and right basal ganglia nonhemorrhagic infarction.  Focal irregularity right carotid bifurcation but no significant stenosis.  Fetal type posterior cerebral artery bilaterally TEE negative for PFO or ASO or endocarditis. Lower extremity Dopplers negative for DVT  WBC 7.4 hemoglobin 13.3 platelet count 259 high: 45 segs 42 lymphs 10 monos 2 eos 1 basophil INR 0.94 PTT 29 AT3 113 protein C activity 191 protein S activity 91 Lupus anticoagulant negative Beta-2 glycoprotein IgG 109.  anticardiolipin notable for IgM 13 (borderline) Factor V Leiden gene mutation study negative prothrombin gene mutation study negative ANA and Antidouble-stranded DNA negative RPR and HIV negative Hemoglobin A1c 6.2 Lipid panel notable for LDL of 138 (borderline) Alpha galactosidase 13.2 (low) (Raised the quesiton of Fabry disease) CMP notable for glucose of 138 albumin 2.8  Stroke was thought to be embolic in nature patient was placed on aspirin 325 mg daily Plavix and a statin  March 22 2020: Right upper quadrant abdominal U/S showed a diffusely echogenic hepatic  parenchyma  August 14, 2021: Diffuse increased liver echogenicity most commonly seen in setting of fatty infiltration.  Superimposed inflammation and fibrosis not excluded  July 27 2022:  [facility] Hematology Consult  Patient states that her son age 36 was diagnosed with an antiphospholipid Ab at age 67 when he presented with PE.  None of her other children have an antiphospholipid antibody.  She was not on OCP's when the CVA occurred and was physically active.   She has been maintained on Coumadin since 2018 and was recently changed to Eliquis.  She has never been on ASA or plavix.  Coumadin dose was 6 to 7 mg daily with INR's ranging from 1.1 to 3.8.  No bleeding problems with coumadin.  Has been on Lipitor since 2018.  Marcelline Deist was started 4 months ago.  Transaminases became elevated but have not been rechecked  Social history:  Disabled.  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  Tobacco none.  EtOH none.    August 01, 2022: WBC 6.8 hemoglobin 10.6 MCV 73 platelet count 330; 53 segs 35 lymphs 9 monos 2 eos 1 basophil Anti-cardiolipan antibody negative beta-2 glycoprotein antibody IgG 127 IgA 24 IgM undetectable INR 2.2 PTT 54   CMP notable for creatinine 0.77 albumin 2.8 AST 59 ALT 73  August 10 2022:  Scheduled follow up.  Reviewed results of lab tests with patient.  She states that the elevation in transaminases noted in November 2023 were attributed to Rybelsis which was stopped.   At that time coumadin dose requirement decreased.  Currently without bleeding issues.  On Coumadin 6 mg daily.   Tissue transglutaminase antibody negative Alpha-1 antitrypsin 198 ANA panel negative LK M1 antibody negative.  Chromatin antibody negative Hepatitis  ABC serologies negative.  CMV PCR negative.  EBV PCR negative.  Ceruloplasmin 34.5. UA showed greater than 500 glucose.  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  Reviewed results of labs with patient.  Currently on Coumadin 6 mg daily.  No bleeding issues.  Patient was begun on Farxiga last month.  May account for why she was subtherapeutic.    September 14 2022 through Sep 30 2022:  Received Venofer total of 1000 mg  November 07, 2022 INR 3.2  December 02 2022: WBC 7.3 hemoglobin 14.8 count 268 normal differential. CMP notable for alk phos 186 albumin 3.4 AST 71 ALT 110  December 07, 2022: Scheduled follow-up for management of antiphospholipid antibody syndrome.  Remains on Coumadin 6 mg daily without bleeding issues.  Reviewed results of labs with patient INR 3.5   Review of Systems  Constitutional:  Negative for appetite change, chills, fatigue, fever and unexpected weight change.  HENT:   Negative for hearing loss, lump/mass, mouth sores, nosebleeds, sore throat, tinnitus, trouble swallowing and voice change.   Eyes:  Negative for eye problems and icterus.       Vision changes:  None  Respiratory:  Negative for chest tightness, cough, hemoptysis, shortness of breath and wheezing.        PND:  none Orthopnea:  none DOE:    Cardiovascular:  Negative for chest pain, leg swelling and palpitations.       PND:  none Orthopnea:  none  Gastrointestinal:  Negative for abdominal pain, blood in stool, constipation, diarrhea, nausea, rectal pain and vomiting.  Endocrine: Negative for hot flashes.       Cold intolerance:  none Heat intolerance:  none  Genitourinary:  Negative for bladder incontinence, difficulty urinating, dysuria, frequency, hematuria and nocturia.   Musculoskeletal:  Positive for gait problem. Negative for arthralgias, back pain, myalgias, neck pain and neck stiffness.  Skin:  Negative for itching, rash and wound.  Neurological:  Positive for extremity weakness and gait problem. Negative for dizziness, headaches, light-headedness, numbness, seizures and speech difficulty.  Hematological:  Negative for adenopathy.  Does not bruise/bleed easily.  Psychiatric/Behavioral:  Negative for sleep disturbance and suicidal ideas. The patient is not nervous/anxious.     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) 12/10/2016    SURGICAL HISTORY: Past Surgical History:  Procedure Laterality Date   CESAREAN SECTION Bilateral 1995, 2002   CHOLECYSTECTOMY     LYMPH NODE BIOPSY     MOUTH SURGERY Left 07/04/2021   NO PAST SURGERIES     TEE WITHOUT CARDIOVERSION N/A 12/05/2016   Procedure: TRANSESOPHAGEAL ECHOCARDIOGRAM (TEE);  Surgeon: Elease Hashimoto Deloris Ping, MD;  Location: [facility] ENDOSCOPY;  Service: Cardiovascular;  Laterality: N/A;    SOCIAL HISTORY: Social History   Socioeconomic History   Marital status: Single    Spouse name: Not on file   Number of children: 3   Years of education: 12   Highest education level: GED or equivalent  Occupational History   Not on file  Tobacco Use   Smoking status: Never   Smokeless tobacco: Never  Vaping Use   Vaping status: Never Used  Substance and Sexual Activity   Alcohol use: No   Drug use: No   Sexual activity: Not Currently    Birth control/protection: Other-see comments  Other Topics Concern   Not on file  Social History Narrative   Not on file   Social Determinants of Health   Financial Resource Strain: Medium Risk (12/01/2022)   Overall Financial Resource Strain (CARDIA)    Difficulty of Paying Living Expenses: Somewhat hard  Food Insecurity: Food Insecurity Present (12/01/2022)   Hunger Vital Sign    Worried About Running Out of Food in the Last Year: Sometimes true    Ran Out of Food in the Last Year: Sometimes true  Transportation Needs: No Transportation Needs (12/01/2022)   PRAPARE - Administrator, Civil Service (Medical): No    Lack of Transportation (Non-Medical):  No  Physical Activity: Insufficiently Active (12/01/2022)   Exercise Vital Sign    Days of Exercise per Week: 5 days    Minutes of Exercise per Session: 10 min  Stress: Stress Concern Present (12/01/2022)   [employer] of Occupational Health - Occupational Stress Questionnaire    Feeling of Stress : To some extent  Social Connections: Moderately Integrated (12/01/2022)   Social Connection and Isolation Panel [NHANES]    Frequency of Communication with Friends and Family: More than three times a week    Frequency of Social Gatherings with Friends and Family: Twice a week    Attends Religious Services: More than 4 times per year    Active Member of [employer] or Organizations: Yes    Attends Engineer, structural: More than 4 times per year    Marital Status: Divorced  Intimate Partner Violence: Not At Risk (07/27/2022)   Humiliation, Afraid, Rape, and Kick questionnaire    Fear of Current or Ex-Partner: No    Emotionally Abused: No    Physically Abused: No    Sexually Abused: No    FAMILY HISTORY Family History  Problem Relation Age of Onset   Lung cancer Mother        mets to brain, bone, liver   Hypertension Father    Heart attack Brother        at age 47   Renal cancer Maternal Grandfather    Heart attack Paternal Grandfather     ALLERGIES:  is allergic to penicillins.  MEDICATIONS:  Current Outpatient Medications  Medication Sig Dispense Refill   atorvastatin (LIPITOR) 80 MG tablet Take 1 tablet (80 mg total) by mouth daily. 90 tablet 1   dapagliflozin propanediol (FARXIGA) 10 MG TABS tablet Take 1 tablet (10 mg total) by mouth daily before breakfast. 90 tablet 0   escitalopram (LEXAPRO) 10 MG tablet Take 1 tablet (10 mg total) by mouth daily. 30 tablet 3   levETIRAcetam (KEPPRA) 750 MG tablet Take 1 tablet (750 mg total) by mouth 2 (two) times daily. 60 tablet 1   nitroGLYCERIN (NITROSTAT) 0.4 MG SL tablet Place 1 tablet under the tongue every 5 (five) minutes as  needed.  0   warfarin (COUMADIN) 3 MG tablet Take 2 tablets (6 mg total) by mouth daily. 60 tablet 0   No current facility-administered medications for this visit.    PHYSICAL EXAMINATION:  ECOG PERFORMANCE STATUS: 1 - Symptomatic but completely ambulatory   There were no vitals filed for this visit.    There were no vitals filed for this visit.     Physical Exam Vitals and nursing note reviewed.  Constitutional:  Appearance: Normal appearance. She is not toxic-appearing or diaphoretic.     Comments: Here alone  HENT:     Head: Normocephalic and atraumatic.     Right Ear: External ear normal.     Left Ear: External ear normal.     Nose: Nose normal. No congestion or rhinorrhea.  Eyes:     General: No scleral icterus.    Extraocular Movements: Extraocular movements intact.     Conjunctiva/sclera: Conjunctivae normal.     Pupils: Pupils are equal, round, and reactive to light.  Cardiovascular:     Rate and Rhythm: Normal rate.     Heart sounds: No murmur heard.    No friction rub. No gallop.  Abdominal:     General: Bowel sounds are normal.     Palpations: Abdomen is soft.  Musculoskeletal:        General: No swelling, tenderness or deformity.     Cervical back: Normal range of motion and neck supple. No rigidity or tenderness.  Lymphadenopathy:     Head:     Right side of head: No submental, submandibular, tonsillar, preauricular, posterior auricular or occipital adenopathy.     Left side of head: No submental, submandibular, tonsillar, preauricular, posterior auricular or occipital adenopathy.     Cervical: No cervical adenopathy.     Right cervical: No superficial, deep or posterior cervical adenopathy.    Left cervical: No superficial, deep or posterior cervical adenopathy.     Upper Body:     Right upper body: No supraclavicular, axillary, pectoral or epitrochlear adenopathy.     Left upper body: No supraclavicular, axillary, pectoral or epitrochlear  adenopathy.  Skin:    General: Skin is warm.     Coloration: Skin is not jaundiced.  Neurological:     General: No focal deficit present.     Mental Status: She is alert and oriented to person, place, and time. Mental status is at baseline.     Cranial Nerves: No cranial nerve deficit.  Psychiatric:        Mood and Affect: Mood normal.        Behavior: Behavior normal.        Thought Content: Thought content normal.        Judgment: Judgment normal.      LABORATORY DATA: I have personally reviewed the data as listed:  Office Visit on 12/02/2022  Component Date Value Ref Range Status   WBC 12/02/2022 7.3  3.4 - 10.8 x10E3/uL Final   RBC 12/02/2022 5.70 (H)  3.77 - 5.28 x10E6/uL Final   Hemoglobin 12/02/2022 14.8  11.1 - 15.9 g/dL Final   Hematocrit 40/98/1191 47.0 (H)  34.0 - 46.6 % Final   MCV 12/02/2022 83  79 - 97 fL Final   MCH 12/02/2022 26.0 (L)  26.6 - 33.0 pg Final   MCHC 12/02/2022 31.5  31.5 - 35.7 g/dL Final   RDW 47/82/9562 21.8 (H)  11.7 - 15.4 % Final   Platelets 12/02/2022 268  150 - 450 x10E3/uL Final   Neutrophils 12/02/2022 59  Not Estab. % Final   Lymphs 12/02/2022 32  Not Estab. % Final   Monocytes 12/02/2022 7  Not Estab. % Final   Eos 12/02/2022 1  Not Estab. % Final   Basos 12/02/2022 1  Not Estab. % Final   Neutrophils Absolute 12/02/2022 4.3  1.4 - 7.0 x10E3/uL Final   Lymphocytes Absolute 12/02/2022 2.3  0.7 - 3.1 x10E3/uL Final   Monocytes Absolute 12/02/2022 0.5  0.1 - 0.9 x10E3/uL Final   EOS (ABSOLUTE) 12/02/2022 0.1  0.0 - 0.4 x10E3/uL Final   Basophils Absolute 12/02/2022 0.0  0.0 - 0.2 x10E3/uL Final   Immature Granulocytes 12/02/2022 0  Not Estab. % Final   Immature Grans (Abs) 12/02/2022 0.0  0.0 - 0.1 x10E3/uL Final   Glucose 12/02/2022 92  70 - 99 mg/dL Final   BUN 16/02/9603 14  6 - 24 mg/dL Final   Creatinine, Ser 12/02/2022 0.76  0.57 - 1.00 mg/dL Final   eGFR 54/01/8118 98  >59 mL/min/1.73 Final   BUN/Creatinine Ratio 12/02/2022  18  9 - 23 Final   Sodium 12/02/2022 139  134 - 144 mmol/L Final   Potassium 12/02/2022 4.0  3.5 - 5.2 mmol/L Final   Chloride 12/02/2022 102  96 - 106 mmol/L Final   CO2 12/02/2022 24  20 - 29 mmol/L Final   Calcium 12/02/2022 8.2 (L)  8.7 - 10.2 mg/dL Final   Total Protein 14/78/2956 7.0  6.0 - 8.5 g/dL Final   Albumin 21/30/8657 3.4 (L)  3.9 - 4.9 g/dL Final   Globulin, Total 12/02/2022 3.6  1.5 - 4.5 g/dL Final   Bilirubin Total 12/02/2022 0.5  0.0 - 1.2 mg/dL Final   Alkaline Phosphatase 12/02/2022 186 (H)  44 - 121 IU/L Final   AST 12/02/2022 71 (H)  0 - 40 IU/L Final   ALT 12/02/2022 110 (H)  0 - 32 IU/L Final   Cholesterol, Total 12/02/2022 156  100 - 199 mg/dL Final   Triglycerides 84/69/6295 106  0 - 149 mg/dL Final   HDL 28/41/3244 51  >39 mg/dL Final   VLDL Cholesterol Cal 12/02/2022 19  5 - 40 mg/dL Final   LDL Chol Calc (NIH) 12/02/2022 86  0 - 99 mg/dL Final   Chol/HDL Ratio 12/02/2022 3.1  0.0 - 4.4 ratio Final   Comment:                                   T. Chol/HDL Ratio                                             Men  Women                               1/2 Avg.Risk  3.4    3.3                                   Avg.Risk  5.0    4.4                                2X Avg.Risk  9.6    7.1                                3X Avg.Risk 23.4   11.0    Hgb A1c MFr Bld 12/02/2022 6.1 (H)  4.8 - 5.6 % Final   Comment:          Prediabetes: 5.7 - 6.4  Diabetes: >6.4          Glycemic control for adults with diabetes: <7.0    Est. average glucose Bld gHb Est-m* 12/02/2022 128  mg/dL Final   Creatinine, Urine 12/02/2022 88.1  Not Estab. mg/dL Final   Microalbumin, Urine 12/02/2022 12.4  Not Estab. ug/mL Final   Microalb/Creat Ratio 12/02/2022 14  0 - 29 mg/g creat Final   Comment:                        Normal:                0 -  29                        Moderately increased: 30 - 300                        Severely increased:       >300     RADIOGRAPHIC  STUDIES: I have personally reviewed the radiological images as listed and agree with the findings in the report  No results found.  ASSESSMENT/PLAN   48 y.o. female is here because of antiphospholipid antibody syndrome.  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  Imaging with CT angio and MRI demonstrated M1 occlusion with resultant nonhemorrhagic infarction of right temporal lobe, insular cortex basal ganglia  Cardiac imaging to locate an embolic source was negative.  Biochemical evaluation notable for Beta-2 glycoprotein IgG 109.   Antiphospholipid antibody syndrome: This is defined by the presence of antiphospholipid antibodies in the setting of thrombosis.  Most common sites of venous and arterial thrombosis in the lower limbs and cerebral circulation.  Patient appears to meet diagnostic criteria based on the presence of the beta-2 glycoprotein IgG which is strongly positive in the history of CVA. July 27 2022- Standard of care in terms of management is the use of warfarin with a goal INR of 2.0-3.0 long-term addition of low-dose aspirin is also a reasonable we will therefore transition patient back to Coumadin with flat dosing.  Discussed Coumadin management with patient    August 01 2022- Anti-cardiolipan antibody negative beta-2 glycoprotein antibody IgG 127 IgA 24 IgM undetectable INR 2.2 PTT 54 .  Patient on Coumadin 6 mg daily  August 10 2022- Patient revealed that in November 2023 she was taking Rybelsus; this likely accounted for the large elevation in transaminases and difficulty in managing Coumadin dosing.  September 07 2022- INR 1.1.  Will increase coumdin dose to 9 mg daily.   December 07 2022- INR 3.5 on Coumadin 6 mg daily.  Will instruct patient to decrease  Coumadin to 5 mg daily  Anemia  September 07 2022- Likely secondary to chronic blood loss exacerbated by anticoagulation   September 14 2022 through Sep 30 2022: Received Venofer total of 1000 mg   December 02 2022- Hgb 14.8   Cancer Staging  No matching staging information was found for the patient.    No problem-specific Assessment & Plan notes found for this encounter.    No orders of the defined types were placed in this encounter.   30  minutes was spent in patient care.  This included time spent preparing to see the patient (  e.g., review of tests), obtaining and/or reviewing separately obtained history, counseling and educating the patient, ordering medications, tests, or procedures; documenting clinical information in the electronic or other health record, independently interpreting results and communicating results to the patient as well as coordination of care.       All questions were answered. The patient knows to call the clinic with any problems, questions or concerns.  This note was electronically signed.    Loni Muse, MD  12/07/2022 2:15 PM

## 2022-12-08 ENCOUNTER — Other Ambulatory Visit: Payer: Self-pay

## 2022-12-08 MED ORDER — WARFARIN SODIUM 5 MG PO TABS: 5.0000 mg | ORAL_TABLET | Freq: Every day | ORAL | 1 refills | Status: DC

## 2022-12-13 ENCOUNTER — Inpatient Hospital Stay: Payer: No Typology Code available for payment source

## 2022-12-13 DIAGNOSIS — Z7901 Long term (current) use of anticoagulants: Secondary | ICD-10-CM

## 2022-12-13 DIAGNOSIS — D6861 Antiphospholipid syndrome: Secondary | ICD-10-CM | POA: Diagnosis not present

## 2022-12-13 LAB — PROTIME-INR
INR: 3 — ABNORMAL HIGH (ref 0.8–1.2)
Prothrombin Time: 31.5 seconds — ABNORMAL HIGH (ref 11.4–15.2)

## 2022-12-22 ENCOUNTER — Encounter: Payer: Self-pay | Admitting: Oncology

## 2023-01-04 ENCOUNTER — Ambulatory Visit: Payer: No Typology Code available for payment source

## 2023-01-04 ENCOUNTER — Inpatient Hospital Stay: Payer: No Typology Code available for payment source | Attending: Oncology

## 2023-01-04 DIAGNOSIS — Z7901 Long term (current) use of anticoagulants: Secondary | ICD-10-CM

## 2023-01-04 DIAGNOSIS — D6861 Antiphospholipid syndrome: Secondary | ICD-10-CM | POA: Diagnosis not present

## 2023-01-04 DIAGNOSIS — Z0000 Encounter for general adult medical examination without abnormal findings: Secondary | ICD-10-CM | POA: Diagnosis not present

## 2023-01-04 LAB — PROTIME-INR
INR: 2.9 — ABNORMAL HIGH (ref 0.8–1.2)
Prothrombin Time: 30.3 seconds — ABNORMAL HIGH (ref 11.4–15.2)

## 2023-01-04 NOTE — Patient Instructions (Signed)
Shannon Erickson , Thank you for taking time to come for your Medicare Wellness Visit. I appreciate your ongoing commitment to your health goals. Please review the following plan we discussed and let me know if I can assist you in the future.    This is a list of the screening recommended for you and due dates:  Health Maintenance  Topic Date Due   Flu Shot  08/21/2023*   Pap Smear  01/04/2024*   Colon Cancer Screening  01/04/2024*   Eye exam for diabetics  01/27/2023   Hemoglobin A1C  06/04/2023   Yearly kidney function blood test for diabetes  12/02/2023   Yearly kidney health urinalysis for diabetes  12/02/2023   Complete foot exam   12/02/2023   Medicare Annual Wellness Visit  01/04/2024   Hepatitis C Screening  Completed   HIV Screening  Completed   HPV Vaccine  Aged Out   DTaP/Tdap/Td vaccine  Discontinued   COVID-19 Vaccine  Discontinued  *Topic was postponed. The date shown is not the original due date.     Preventive Care 40-64 Years, Female Preventive care refers to lifestyle choices and visits with your health care provider that can promote health and wellness. What does preventive care include? A yearly physical exam. This is also called an annual well check. Dental exams once or twice a year. Routine eye exams. Ask your health care provider how often you should have your eyes checked. Personal lifestyle choices, including: Daily care of your teeth and gums. Regular physical activity. Eating a healthy diet. Avoiding tobacco and drug use. Limiting alcohol use. Practicing safe sex. Taking low-dose aspirin daily starting at age 28. Taking vitamin and mineral supplements as recommended by your health care provider. What happens during an annual well check? The services and screenings done by your health care provider during your annual well check will depend on your age, overall health, lifestyle risk factors, and family history of disease. Counseling  Your health care  provider may ask you questions about your: Alcohol use. Tobacco use. Drug use. Emotional well-being. Home and relationship well-being. Sexual activity. Eating habits. Work and work Astronomer. Method of birth control. Menstrual cycle. Pregnancy history. Screening  You may have the following tests or measurements: Height, weight, and BMI. Blood pressure. Lipid and cholesterol levels. These may be checked every 5 years, or more frequently if you are over 51 years old. Skin check. Lung cancer screening. You may have this screening every year starting at age 73 if you have a 30-pack-year history of smoking and currently smoke or have quit within the past 15 years. Fecal occult blood test (FOBT) of the stool. You may have this test every year starting at age 38. Flexible sigmoidoscopy or colonoscopy. You may have a sigmoidoscopy every 5 years or a colonoscopy every 10 years starting at age 50. Hepatitis C blood test. Hepatitis B blood test. Sexually transmitted disease (STD) testing. Diabetes screening. This is done by checking your blood sugar (glucose) after you have not eaten for a while (fasting). You may have this done every 1-3 years. Mammogram. This may be done every 1-2 years. Talk to your health care provider about when you should start having regular mammograms. This may depend on whether you have a family history of breast cancer. BRCA-related cancer screening. This may be done if you have a family history of breast, ovarian, tubal, or peritoneal cancers. Pelvic exam and Pap test. This may be done every 3 years starting at age  21. Starting at age 59, this may be done every 5 years if you have a Pap test in combination with an HPV test. Bone density scan. This is done to screen for osteoporosis. You may have this scan if you are at high risk for osteoporosis. Discuss your test results, treatment options, and if necessary, the need for more tests with your health care  provider. Vaccines  Your health care provider may recommend certain vaccines, such as: Influenza vaccine. This is recommended every year. Tetanus, diphtheria, and acellular pertussis (Tdap, Td) vaccine. You may need a Td booster every 10 years. Zoster vaccine. You may need this after age 57. Pneumococcal 13-valent conjugate (PCV13) vaccine. You may need this if you have certain conditions and were not previously vaccinated. Pneumococcal polysaccharide (PPSV23) vaccine. You may need one or two doses if you smoke cigarettes or if you have certain conditions. Talk to your health care provider about which screenings and vaccines you need and how often you need them. This information is not intended to replace advice given to you by your health care provider. Make sure you discuss any questions you have with your health care provider. Document Released: 06/05/2015 Document Revised: 01/27/2016 Document Reviewed: 03/10/2015 Elsevier Interactive Patient Education  2017 [employer].    Fall Prevention in the Home Falls can cause injuries. They can happen to people of all ages. There are many things you can do to make your home safe and to help prevent falls. What can I do on the outside of my home? Regularly fix the edges of walkways and driveways and fix any cracks. Remove anything that might make you trip as you walk through a door, such as a raised step or threshold. Trim any bushes or trees on the path to your home. Use bright outdoor lighting. Clear any walking paths of anything that might make someone trip, such as rocks or tools. Regularly check to see if handrails are loose or broken. Make sure that both sides of any steps have handrails. Any raised decks and porches should have guardrails on the edges. Have any leaves, snow, or ice cleared regularly. Use sand or salt on walking paths during winter. Clean up any spills in your garage right away. This includes oil or grease spills. What  can I do in the bathroom? Use night lights. Install grab bars by the toilet and in the tub and shower. Do not use towel bars as grab bars. Use non-skid mats or decals in the tub or shower. If you need to sit down in the shower, use a plastic, non-slip stool. Keep the floor dry. Clean up any water that spills on the floor as soon as it happens. Remove soap buildup in the tub or shower regularly. Attach bath mats securely with double-sided non-slip rug tape. Do not have throw rugs and other things on the floor that can make you trip. What can I do in the bedroom? Use night lights. Make sure that you have a light by your bed that is easy to reach. Do not use any sheets or blankets that are too big for your bed. They should not hang down onto the floor. Have a firm chair that has side arms. You can use this for support while you get dressed. Do not have throw rugs and other things on the floor that can make you trip. What can I do in the kitchen? Clean up any spills right away. Avoid walking on wet floors. Keep items that you  use a lot in easy-to-reach places. If you need to reach something above you, use a strong step stool that has a grab bar. Keep electrical cords out of the way. Do not use floor polish or wax that makes floors slippery. If you must use wax, use non-skid floor wax. Do not have throw rugs and other things on the floor that can make you trip. What can I do with my stairs? Do not leave any items on the stairs. Make sure that there are handrails on both sides of the stairs and use them. Fix handrails that are broken or loose. Make sure that handrails are as long as the stairways. Check any carpeting to make sure that it is firmly attached to the stairs. Fix any carpet that is loose or worn. Avoid having throw rugs at the top or bottom of the stairs. If you do have throw rugs, attach them to the floor with carpet tape. Make sure that you have a light switch at the top of the  stairs and the bottom of the stairs. If you do not have them, ask someone to add them for you. What else can I do to help prevent falls? Wear shoes that: Do not have high heels. Have rubber bottoms. Are comfortable and fit you well. Are closed at the toe. Do not wear sandals. If you use a stepladder: Make sure that it is fully opened. Do not climb a closed stepladder. Make sure that both sides of the stepladder are locked into place. Ask someone to hold it for you, if possible. Clearly mark and make sure that you can see: Any grab bars or handrails. First and last steps. Where the edge of each step is. Use tools that help you move around (mobility aids) if they are needed. These include: Canes. Walkers. Scooters. Crutches. Turn on the lights when you go into a dark area. Replace any light bulbs as soon as they burn out. Set up your furniture so you have a clear path. Avoid moving your furniture around. If any of your floors are uneven, fix them. If there are any pets around you, be aware of where they are. Review your medicines with your doctor. Some medicines can make you feel dizzy. This can increase your chance of falling. Ask your doctor what other things that you can do to help prevent falls. This information is not intended to replace advice given to you by your health care provider. Make sure you discuss any questions you have with your health care provider. Document Released: 03/05/2009 Document Revised: 10/15/2015 Document Reviewed: 06/13/2014 Elsevier Interactive Patient Education  2017 [employer].

## 2023-01-04 NOTE — Progress Notes (Signed)
Subjective:   Shannon Erickson is a 47 y.o. female who presents for Medicare Annual (Subsequent) preventive examination.  This wellness visit is conducted by a nurse.  The patient's medications were reviewed and reconciled since the patient's last visit.  History details were provided by the patient.  The history appears to be reliable.    Medical History: Patient history and Family history was reviewed  Medications, Allergies, and preventative health maintenance was reviewed and updated.   Visit Complete: Virtual  I connected with  Shannon Erickson on 01/04/23 by a audio enabled telemedicine application and verified that I am speaking with the correct person using two identifiers.  Patient Location: Home  Provider Location: Office/Clinic  I discussed the limitations of evaluation and management by telemedicine. The patient expressed understanding and agreed to proceed.  Patient Medicare AWV questionnaire was completed by the patient on 01/04/23; I have confirmed that all information answered by patient is correct and no changes since this date.  Cardiac Risk Factors include: diabetes mellitus;obesity (BMI >30kg/m2)     Objective:    Today's Vitals   01/04/23 1506  PainSc: 0-No pain   Vital Signs: Unable to obtain new vitals due to this being a telehealth visit.  There is no height or weight on file to calculate BMI.     09/30/2022    4:09 PM 09/23/2022    3:09 PM 09/14/2022    4:12 PM 07/27/2022    3:34 PM 12/29/2021   10:15 AM 06/24/2017    4:23 PM 01/31/2017   11:25 AM  Advanced Directives  Does Patient Have a Medical Advance Directive? No No No No No No No  Would patient like information on creating a medical advance directive?     No - Patient declined      Current Medications (verified) Outpatient Encounter Medications as of 01/04/2023  Medication Sig   atorvastatin (LIPITOR) 80 MG tablet Take 1 tablet (80 mg total) by mouth daily.   dapagliflozin propanediol  (FARXIGA) 10 MG TABS tablet Take 1 tablet (10 mg total) by mouth daily before breakfast.   escitalopram (LEXAPRO) 10 MG tablet Take 1 tablet (10 mg total) by mouth daily.   levETIRAcetam (KEPPRA) 750 MG tablet Take 1 tablet (750 mg total) by mouth 2 (two) times daily.   nitroGLYCERIN (NITROSTAT) 0.4 MG SL tablet Place 1 tablet under the tongue every 5 (five) minutes as needed.   warfarin (COUMADIN) 5 MG tablet Take 1 tablet (5 mg total) by mouth daily.   warfarin (COUMADIN) 3 MG tablet Take 2 tablets (6 mg total) by mouth daily.   No facility-administered encounter medications on file as of 01/04/2023.    Allergies (verified) Penicillins   History: Past Medical History:  Diagnosis Date   Hypertension    Lesion of left ulnar nerve    Migraine    Seizures (HCC)    Stroke 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   Past Surgical History:  Procedure Laterality Date   CESAREAN SECTION Bilateral 1995, 2002   CHOLECYSTECTOMY     LYMPH NODE BIOPSY     MOUTH SURGERY Left 07/04/2021   TEE WITHOUT CARDIOVERSION N/A 12/05/2016   Procedure: TRANSESOPHAGEAL ECHOCARDIOGRAM (TEE);  Surgeon: Elease Hashimoto Deloris Ping, MD;  Location: [facility] ENDOSCOPY;  Service: Cardiovascular;  Laterality: N/A;   Family History  Problem Relation Age of Onset   Lung cancer Mother        mets to brain,  bone, liver   Hypertension Father    Heart attack Brother        at age 58   Renal cancer Maternal Grandfather    Heart attack Paternal Grandfather    Social History   Socioeconomic History   Marital status: Single    Spouse name: Not on file   Number of children: 3   Years of education: 12   Highest education level: GED or equivalent  Occupational History   Not on file  Tobacco Use   Smoking status: Never   Smokeless tobacco: Never  Vaping Use   Vaping status: Never Used  Substance and Sexual Activity   Alcohol use: No   Drug use: No   Sexual activity: Not Currently     Birth control/protection: Other-see comments  Other Topics Concern   Not on file  Social History Narrative   Not on file   Social Determinants of Health   Financial Resource Strain: Low Risk  (01/04/2023)   Overall Financial Resource Strain (CARDIA)    Difficulty of Paying Living Expenses: Not hard at all  Recent Concern: Financial Resource Strain - Medium Risk (12/01/2022)   Overall Financial Resource Strain (CARDIA)    Difficulty of Paying Living Expenses: Somewhat hard  Food Insecurity: No Food Insecurity (01/04/2023)   Hunger Vital Sign    Worried About Running Out of Food in the Last Year: Never true    Ran Out of Food in the Last Year: Never true  Recent Concern: Food Insecurity - Food Insecurity Present (12/01/2022)   Hunger Vital Sign    Worried About Running Out of Food in the Last Year: Sometimes true    Ran Out of Food in the Last Year: Sometimes true  Transportation Needs: No Transportation Needs (01/04/2023)   PRAPARE - Administrator, Civil Service (Medical): No    Lack of Transportation (Non-Medical): No  Physical Activity: Sufficiently Active (01/04/2023)   Exercise Vital Sign    Days of Exercise per Week: 5 days    Minutes of Exercise per Session: 30 min  Recent Concern: Physical Activity - Insufficiently Active (12/01/2022)   Exercise Vital Sign    Days of Exercise per Week: 5 days    Minutes of Exercise per Session: 10 min  Stress: No Stress Concern Present (01/04/2023)   [employer] of Occupational Health - Occupational Stress Questionnaire    Feeling of Stress : Not at all  Recent Concern: Stress - Stress Concern Present (12/01/2022)   [employer] of Occupational Health - Occupational Stress Questionnaire    Feeling of Stress : To some extent  Social Connections: Moderately Isolated (01/04/2023)   Social Connection and Isolation Panel [NHANES]    Frequency of Communication with Friends and Family: More than three times a week    Frequency  of Social Gatherings with Friends and Family: More than three times a week    Attends Religious Services: More than 4 times per year    Active Member of [employer] or Organizations: No    Attends Engineer, structural: Never    Marital Status: Never married    Tobacco Counseling Counseling given: Not Answered   Clinical Intake:  Pre-visit preparation completed: Yes Pain : No/denies pain Pain Score: 0-No pain   BMI - recorded: 35.3 Nutritional Status: BMI > 30  Obese Nutritional Risks: None Diabetes: Yes (Most recent A1C 6.1) CBG done?: No How often do you need to have someone help you when you read instructions,  pamphlets, or other written materials from your doctor or pharmacy?: 1 - Never Interpreter Needed?: No     Activities of Daily Living    01/04/2023    2:53 PM  In your present state of health, do you have any difficulty performing the following activities:  Hearing? 0  Vision? 0  Difficulty concentrating or making decisions? 0  Walking or climbing stairs? 1  Dressing or bathing? 1  Doing errands, shopping? 0  Preparing Food and eating ? N  Using the Toilet? N  In the past six months, have you accidently leaked urine? Y  Do you have problems with loss of bowel control? N  Managing your Medications? N  Managing your Finances? N  Housekeeping or managing your Housekeeping? Y    Patient Care Team: Blane Ohara, MD as PCP - General (Family Medicine) Loni Muse, MD as Consulting Physician (Internal Medicine)     Assessment:   This is a routine wellness examination for Shannon Erickson.  Hearing/Vision screen No results found.  Dietary issues and exercise activities discussed: Aim for 30 minutes of exercise or brisk walking, 6-8 glasses of water, and 5 servings of fruits and vegetables each day.     Depression Screen    01/04/2023    3:14 PM 12/02/2022   10:42 AM 08/03/2022   10:35 AM 12/29/2021   10:15 AM 02/02/2021    1:12 PM 12/11/2019   11:21 AM   PHQ 2/9 Scores  PHQ - 2 Score 1 1 0 0 0 0  PHQ- 9 Score 4 4        Fall Risk    01/04/2023    2:53 PM 12/02/2022   10:42 AM 08/03/2022   10:40 AM 12/29/2021   10:15 AM 12/11/2019   11:28 AM  Fall Risk   Falls in the past year? 0 0 0 0 0  Number falls in past yr: 0 0 0  0  Injury with Fall? 0 0 0  0  Risk for fall due to : Impaired balance/gait Impaired balance/gait Impaired balance/gait;Other (Comment)  Impaired balance/gait;Impaired mobility  Risk for fall due to: Comment   walks with cane hx of stroke    Follow up Falls evaluation completed;Education provided Falls evaluation completed Falls evaluation completed  Falls evaluation completed    MEDICARE RISK AT HOME:   TIMED UP AND GO:  Was the test performed?  No    Immunizations Immunization History  Administered Date(s) Administered   Moderna Sars-Covid-2 Vaccination 12/24/2019, 02/14/2020    TDAP status: Due, Education has been provided regarding the importance of this vaccine. Advised may receive this vaccine at local pharmacy or Health Dept. Aware to provide a copy of the vaccination record if obtained from local pharmacy or Health Dept. Verbalized acceptance and understanding.  Flu Vaccine status: Declined, Education has been provided regarding the importance of this vaccine but patient still declined. Advised may receive this vaccine at local pharmacy or Health Dept. Aware to provide a copy of the vaccination record if obtained from local pharmacy or Health Dept. Verbalized acceptance and understanding.   Covid-19 vaccine status: Declined, Education has been provided regarding the importance of this vaccine but patient still declined. Advised may receive this vaccine at local pharmacy or Health Dept.or vaccine clinic. Aware to provide a copy of the vaccination record if obtained from local pharmacy or Health Dept. Verbalized acceptance and understanding.  Screening Tests Health Maintenance  Topic Date Due   Medicare  Annual Wellness (AWV)  12/30/2022  INFLUENZA VACCINE  08/21/2023 (Originally 12/22/2022)   PAP SMEAR-Modifier  01/04/2024 (Originally 04/22/1997)   Colonoscopy  01/04/2024 (Originally 04/22/2021)   OPHTHALMOLOGY EXAM  01/27/2023   HEMOGLOBIN A1C  06/04/2023   Diabetic kidney evaluation - eGFR measurement  12/02/2023   Diabetic kidney evaluation - Urine ACR  12/02/2023   FOOT EXAM  12/02/2023   Hepatitis C Screening  Completed   HIV Screening  Completed   HPV VACCINES  Aged Out   DTaP/Tdap/Td  Discontinued   COVID-19 Vaccine  Discontinued    Health Maintenance  Health Maintenance Due  Topic Date Due   Medicare Annual Wellness (AWV)  12/30/2022    Lung Cancer Screening: (Low Dose CT Chest recommended if Age 4-80 years, 20 pack-year currently smoking OR have quit w/in 15years.) does not qualify.   Lung Cancer Screening Referral: N/A  Additional Screening:  Vision Screening: Recommended annual ophthalmology exams for early detection of glaucoma and other disorders of the eye. Is the patient up to date with their annual eye exam?  Yes   Dental Screening: Recommended annual dental exams for proper oral hygiene  Community Resource Referral / Chronic Care Management: CRR required this visit?  No   CCM required this visit?  No     Plan:    1- Flu and tetanus vaccines recommended and declined 2- Cervical cancer and colorectal cancer screening recommended and declined  I have personally reviewed and noted the following in the patient's chart:   Medical and social history Use of alcohol, tobacco or illicit drugs  Current medications and supplements including opioid prescriptions.  Functional ability and status Nutritional status Physical activity Advanced directives List of other physicians Hospitalizations, surgeries, and ER visits in previous 12 months Vitals Screenings to include cognitive, depression, and falls Referrals and appointments  In addition, I have  reviewed and discussed with patient certain preventive protocols, quality metrics, and best practice recommendations. A written personalized care plan for preventive services as well as general preventive health recommendations were provided to patient.     Jacklynn Bue, LPN   [id number]/61/0960   After Visit Summary: (MyChart) Due to this being a telephonic visit, the after visit summary with patients personalized plan was offered to patient via MyChart

## 2023-01-05 ENCOUNTER — Other Ambulatory Visit: Payer: Self-pay | Admitting: Oncology

## 2023-02-01 ENCOUNTER — Inpatient Hospital Stay: Payer: No Typology Code available for payment source | Attending: Oncology

## 2023-02-01 DIAGNOSIS — D6861 Antiphospholipid syndrome: Secondary | ICD-10-CM | POA: Diagnosis not present

## 2023-02-01 DIAGNOSIS — Z7901 Long term (current) use of anticoagulants: Secondary | ICD-10-CM

## 2023-02-01 LAB — PROTIME-INR
INR: 1.6 — ABNORMAL HIGH (ref 0.8–1.2)
Prothrombin Time: 19.6 s — ABNORMAL HIGH (ref 11.4–15.2)

## 2023-02-03 ENCOUNTER — Other Ambulatory Visit: Payer: Self-pay

## 2023-02-03 MED ORDER — WARFARIN SODIUM 5 MG PO TABS: 7.5000 mg | ORAL_TABLET | Freq: Every day | ORAL | 1 refills | Status: DC

## 2023-02-04 ENCOUNTER — Other Ambulatory Visit: Payer: Self-pay | Admitting: Family Medicine

## 2023-02-04 DIAGNOSIS — G40909 Epilepsy, unspecified, not intractable, without status epilepticus: Secondary | ICD-10-CM

## 2023-02-08 ENCOUNTER — Inpatient Hospital Stay: Payer: No Typology Code available for payment source

## 2023-02-08 DIAGNOSIS — Z7901 Long term (current) use of anticoagulants: Secondary | ICD-10-CM

## 2023-02-08 DIAGNOSIS — D6861 Antiphospholipid syndrome: Secondary | ICD-10-CM | POA: Diagnosis not present

## 2023-02-08 LAB — PROTIME-INR
INR: 4 — ABNORMAL HIGH (ref 0.8–1.2)
Prothrombin Time: 39 s — ABNORMAL HIGH (ref 11.4–15.2)

## 2023-02-09 ENCOUNTER — Telehealth: Payer: Self-pay

## 2023-02-09 NOTE — Telephone Encounter (Signed)
Called patient to let her know to reduce Coumadin to 5 mg and repeat INR next week.

## 2023-02-09 NOTE — Telephone Encounter (Signed)
-----   Message from Loni Muse sent at 02/09/2023  9:42 AM EDT ----- Please have patient reduce coumadin dose to 5 mg daily Will need follow up INR in 1 week Thanks

## 2023-02-17 ENCOUNTER — Inpatient Hospital Stay: Payer: No Typology Code available for payment source

## 2023-02-17 DIAGNOSIS — D6861 Antiphospholipid syndrome: Secondary | ICD-10-CM | POA: Diagnosis not present

## 2023-02-17 DIAGNOSIS — Z7901 Long term (current) use of anticoagulants: Secondary | ICD-10-CM

## 2023-02-17 LAB — PROTIME-INR
INR: 2.7 — ABNORMAL HIGH (ref 0.8–1.2)
Prothrombin Time: 28.6 s — ABNORMAL HIGH (ref 11.4–15.2)

## 2023-02-22 ENCOUNTER — Other Ambulatory Visit: Payer: Self-pay | Admitting: Family Medicine

## 2023-02-22 ENCOUNTER — Encounter: Payer: Self-pay | Admitting: Oncology

## 2023-02-22 DIAGNOSIS — E669 Obesity, unspecified: Secondary | ICD-10-CM

## 2023-02-22 DIAGNOSIS — G40909 Epilepsy, unspecified, not intractable, without status epilepticus: Secondary | ICD-10-CM

## 2023-02-23 ENCOUNTER — Other Ambulatory Visit: Payer: Self-pay | Admitting: Family Medicine

## 2023-02-23 DIAGNOSIS — I152 Hypertension secondary to endocrine disorders: Secondary | ICD-10-CM

## 2023-02-23 MED ORDER — LEVETIRACETAM 750 MG PO TABS
750.0000 mg | ORAL_TABLET | Freq: Two times a day (BID) | ORAL | 0 refills | Status: DC
Start: 2023-02-23 — End: 2023-04-04

## 2023-02-23 MED ORDER — DAPAGLIFLOZIN PROPANEDIOL 10 MG PO TABS
10.0000 mg | ORAL_TABLET | Freq: Every day | ORAL | 0 refills | Status: DC
Start: 2023-02-23 — End: 2023-03-29

## 2023-03-01 ENCOUNTER — Inpatient Hospital Stay: Payer: No Typology Code available for payment source | Attending: Oncology

## 2023-03-01 ENCOUNTER — Inpatient Hospital Stay (INDEPENDENT_AMBULATORY_CARE_PROVIDER_SITE_OTHER): Payer: No Typology Code available for payment source | Admitting: Oncology

## 2023-03-01 VITALS — BP 121/69 | HR 84 | Temp 98.2°F | Resp 16 | Ht 63.0 in | Wt 199.2 lb

## 2023-03-01 DIAGNOSIS — Z7901 Long term (current) use of anticoagulants: Secondary | ICD-10-CM

## 2023-03-01 DIAGNOSIS — D539 Nutritional anemia, unspecified: Secondary | ICD-10-CM | POA: Diagnosis not present

## 2023-03-01 DIAGNOSIS — K7460 Unspecified cirrhosis of liver: Secondary | ICD-10-CM | POA: Insufficient documentation

## 2023-03-01 DIAGNOSIS — D6861 Antiphospholipid syndrome: Secondary | ICD-10-CM | POA: Insufficient documentation

## 2023-03-01 DIAGNOSIS — K7581 Nonalcoholic steatohepatitis (NASH): Secondary | ICD-10-CM

## 2023-03-01 DIAGNOSIS — D649 Anemia, unspecified: Secondary | ICD-10-CM | POA: Insufficient documentation

## 2023-03-01 DIAGNOSIS — Z79899 Other long term (current) drug therapy: Secondary | ICD-10-CM | POA: Diagnosis not present

## 2023-03-01 LAB — CBC WITH DIFFERENTIAL/PLATELET
Abs Immature Granulocytes: 0.01 10*3/uL (ref 0.00–0.07)
Basophils Absolute: 0.1 10*3/uL (ref 0.0–0.1)
Basophils Relative: 1 %
Eosinophils Absolute: 0.1 10*3/uL (ref 0.0–0.5)
Eosinophils Relative: 1 %
HCT: 34.2 % — ABNORMAL LOW (ref 36.0–46.0)
Hemoglobin: 9.9 g/dL — ABNORMAL LOW (ref 12.0–15.0)
Immature Granulocytes: 0 %
Lymphocytes Relative: 39 %
Lymphs Abs: 2.2 10*3/uL (ref 0.7–4.0)
MCH: 22 pg — ABNORMAL LOW (ref 26.0–34.0)
MCHC: 28.9 g/dL — ABNORMAL LOW (ref 30.0–36.0)
MCV: 76 fL — ABNORMAL LOW (ref 80.0–100.0)
Monocytes Absolute: 0.5 10*3/uL (ref 0.1–1.0)
Monocytes Relative: 8 %
Neutro Abs: 2.8 10*3/uL (ref 1.7–7.7)
Neutrophils Relative %: 51 %
Platelets: 305 10*3/uL (ref 150–400)
RBC: 4.5 MIL/uL (ref 3.87–5.11)
RDW: 15.9 % — ABNORMAL HIGH (ref 11.5–15.5)
WBC: 5.6 10*3/uL (ref 4.0–10.5)
nRBC: 0 % (ref 0.0–0.2)

## 2023-03-01 LAB — COMPREHENSIVE METABOLIC PANEL
ALT: 74 U/L — ABNORMAL HIGH (ref 0–44)
AST: 54 U/L — ABNORMAL HIGH (ref 15–41)
Albumin: 2.5 g/dL — ABNORMAL LOW (ref 3.5–5.0)
Alkaline Phosphatase: 143 U/L — ABNORMAL HIGH (ref 38–126)
Anion gap: 7 (ref 5–15)
BUN: 11 mg/dL (ref 6–20)
CO2: 23 mmol/L (ref 22–32)
Calcium: 8 mg/dL — ABNORMAL LOW (ref 8.9–10.3)
Chloride: 108 mmol/L (ref 98–111)
Creatinine, Ser: 0.82 mg/dL (ref 0.44–1.00)
GFR, Estimated: >60 mL/min (ref >60)
Glucose, Bld: 131 mg/dL — ABNORMAL HIGH (ref 70–99)
Potassium: 3.8 mmol/L (ref 3.5–5.1)
Sodium: 138 mmol/L (ref 135–145)
Total Bilirubin: 0.8 mg/dL (ref 0.3–1.2)
Total Protein: 7 g/dL (ref 6.5–8.1)

## 2023-03-01 LAB — PROTIME-INR
INR: 2.7 — ABNORMAL HIGH (ref 0.8–1.2)
Prothrombin Time: 28.8 s — ABNORMAL HIGH (ref 11.4–15.2)

## 2023-03-01 LAB — FERRITIN: Ferritin: 4 ng/mL — ABNORMAL LOW (ref 11–307)

## 2023-03-01 NOTE — Progress Notes (Signed)
[facility] Cancer Follow up Visit:  Patient Care Team: Blane Ohara, MD as PCP - General (Family Medicine) Loni Muse, MD as Consulting Physician (Internal Medicine)  CHIEF COMPLAINTS/PURPOSE OF CONSULTATION:  HISTORY OF PRESENTING ILLNESS: Shannon Erickson 47 y.o. 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 Migraines  December 03 2016: Presented to [facility] health emergency department with right sided headache.   CT head without contrast negative for hemorrhage MRI head without contrast negative for hemorrhage but suspected right MCA M1 stenosis or occlusion CT angiogram showed proximal right M1 occlusion.  Evolution of right temporal lobe, insular cortex and right basal ganglia nonhemorrhagic infarction.  Focal irregularity right carotid bifurcation but no significant stenosis.  Fetal type posterior cerebral artery bilaterally TEE negative for PFO or ASO or endocarditis. Lower extremity Dopplers negative for DVT  WBC 7.4 hemoglobin 13.3 platelet count 259 high: 45 segs 42 lymphs 10 monos 2 eos 1 basophil INR 0.94 PTT 29 AT3 113 protein C activity 191 protein S activity 91 Lupus anticoagulant negative Beta-2 glycoprotein IgG 109.  anticardiolipin notable for IgM 13 (borderline) Factor V Leiden gene mutation study negative prothrombin gene mutation study negative ANA and Antidouble-stranded DNA negative RPR and HIV negative Hemoglobin A1c 6.2 Lipid panel notable for LDL of 138 (borderline) Alpha galactosidase 13.2 (low) (Raised the quesiton of Fabry disease) CMP notable for glucose of 138 albumin 2.8  Stroke was thought to be embolic in nature patient was placed on aspirin 325 mg daily Plavix and a statin  March 22 2020: Right upper quadrant abdominal U/S showed a diffusely echogenic hepatic  parenchyma  August 14, 2021: Diffuse increased liver echogenicity most commonly seen in setting of fatty infiltration.  Superimposed inflammation and fibrosis not excluded  July 27 2022:  [facility] Hematology Consult  Patient states that her son age 48 was diagnosed with an antiphospholipid Ab at age 57 when he presented with PE.  None of her other children have an antiphospholipid antibody.  She was not on OCP's when the CVA occurred and was physically active.   She has been maintained on Coumadin since 2018 and was recently changed to Eliquis.  She has never been on ASA or plavix.  Coumadin dose was 6 to 7 mg daily with INR's ranging from 1.1 to 3.8.  No bleeding problems with coumadin.  Has been on Lipitor since 2018.  Marcelline Deist was started 4 months ago.  Transaminases became elevated but have not been rechecked  Social history:  Disabled.  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  Tobacco none.  EtOH none.    August 01, 2022: WBC 6.8 hemoglobin 10.6 MCV 73 platelet count 330; 53 segs 35 lymphs 9 monos 2 eos 1 basophil Anti-cardiolipan antibody negative beta-2 glycoprotein antibody IgG 127 IgA 24 IgM undetectable INR 2.2 PTT 54   CMP notable for creatinine 0.77 albumin 2.8 AST 59 ALT 73  August 10 2022:  Scheduled follow up.  Reviewed results of lab tests with patient.  She states that the elevation in transaminases noted in November 2023 were attributed to Rybelsis which was stopped.   At that time coumadin dose requirement decreased.  Currently without bleeding issues.  On Coumadin 6 mg daily.   Tissue transglutaminase antibody negative Alpha-1 antitrypsin 198 ANA panel negative LK M1 antibody negative.  Chromatin antibody negative Hepatitis  ABC serologies negative.  CMV PCR negative.  EBV PCR negative.  Ceruloplasmin 34.5. UA showed greater than 500 glucose.  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  Reviewed results of labs with patient.  Currently on Coumadin 6 mg daily.  No bleeding issues.  Patient was begun on Farxiga last month.  May account for why she was subtherapeutic.    September 14 2022 through Sep 30 2022:  Received Venofer total of 1000 mg  November 07, 2022 INR 3.2  December 02 2022: WBC 7.3 hemoglobin 14.8 count 268 normal differential. CMP notable for alk phos 186 albumin 3.4 AST 71 ALT 110  December 07, 2022: Remains on Coumadin 6 mg daily without bleeding issues.  Reviewed results of labs with patient.  INR 3.5  March 01 2023:  Scheduled follow-up for management of antiphospholipid antibody syndrome.   Remains on Coumadin 6 mg daily without bleeding issues.   Hemoglobin 9.9 MCV 76.  CMP notable for glucose 131 alk phos 143 albumin 2.5 AST 54 ALT 74.  Ferritin 4 NR 2.7  March 07 2023:  Follow up with PCP   Review of Systems  Constitutional:  Negative for appetite change, chills, fatigue, fever and unexpected weight change.  HENT:   Negative for hearing loss, lump/mass, mouth sores, nosebleeds, sore throat, tinnitus, trouble swallowing and voice change.   Eyes:  Negative for eye problems and icterus.       Vision changes:  None  Respiratory:  Negative for chest tightness, cough, hemoptysis, shortness of breath and wheezing.        PND:  none Orthopnea:  none DOE:    Cardiovascular:  Negative for chest pain, leg swelling and palpitations.       PND:  none Orthopnea:  none  Gastrointestinal:  Negative for abdominal pain, blood in stool, constipation, diarrhea, nausea, rectal pain and vomiting.  Endocrine: Negative for hot flashes.       Cold intolerance:  none Heat intolerance:  none  Genitourinary:  Negative for bladder incontinence, difficulty urinating, dysuria, frequency, hematuria and nocturia.   Musculoskeletal:  Positive for gait problem. Negative for arthralgias, back pain, myalgias, neck pain and neck stiffness.  Skin:  Negative for  itching, rash and wound.  Neurological:  Positive for extremity weakness and gait problem. Negative for dizziness, headaches, light-headedness, numbness, seizures and speech difficulty.  Hematological:  Negative for adenopathy. Does not bruise/bleed easily.  Psychiatric/Behavioral:  Negative for sleep disturbance and suicidal ideas. The patient is not nervous/anxious.     MEDICAL HISTORY: Past Medical History:  Diagnosis Date   Hypertension    Lesion of left ulnar nerve    Migraine    Seizures (HCC)    Stroke [facility])    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) 12/10/2016    SURGICAL HISTORY: Past Surgical History:  Procedure Laterality Date   CESAREAN SECTION Bilateral 1995, 2002   CHOLECYSTECTOMY     LYMPH NODE BIOPSY     MOUTH SURGERY Left 07/04/2021   TEE WITHOUT CARDIOVERSION N/A 12/05/2016   Procedure: TRANSESOPHAGEAL ECHOCARDIOGRAM (TEE);  Surgeon: Elease Hashimoto Deloris Ping, MD;  Location: [facility] ENDOSCOPY;  Service: Cardiovascular;  Laterality: N/A;    SOCIAL HISTORY: Social History   Socioeconomic History   Marital status: Single    Spouse name: Not on file   Number of children: 3  Years of education: 57   Highest education level: GED or equivalent  Occupational History   Not on file  Tobacco Use   Smoking status: Never   Smokeless tobacco: Never  Vaping Use   Vaping status: Never Used  Substance and Sexual Activity   Alcohol use: No   Drug use: No   Sexual activity: Not Currently    Birth control/protection: Other-see comments  Other Topics Concern   Not on file  Social History Narrative   Not on file   Social Determinants of Health   Financial Resource Strain: Low Risk  (01/04/2023)   Overall Financial Resource Strain (CARDIA)    Difficulty of Paying Living Expenses: Not hard at all  Recent Concern: Financial Resource Strain - Medium Risk (12/01/2022)   Overall Financial Resource Strain (CARDIA)    Difficulty of Paying Living Expenses:  Somewhat hard  Food Insecurity: No Food Insecurity (01/04/2023)   Hunger Vital Sign    Worried About Running Out of Food in the Last Year: Never true    Ran Out of Food in the Last Year: Never true  Recent Concern: Food Insecurity - Food Insecurity Present (12/01/2022)   Hunger Vital Sign    Worried About Running Out of Food in the Last Year: Sometimes true    Ran Out of Food in the Last Year: Sometimes true  Transportation Needs: No Transportation Needs (01/04/2023)   PRAPARE - Administrator, Civil Service (Medical): No    Lack of Transportation (Non-Medical): No  Physical Activity: Sufficiently Active (01/04/2023)   Exercise Vital Sign    Days of Exercise per Week: 5 days    Minutes of Exercise per Session: 30 min  Recent Concern: Physical Activity - Insufficiently Active (12/01/2022)   Exercise Vital Sign    Days of Exercise per Week: 5 days    Minutes of Exercise per Session: 10 min  Stress: No Stress Concern Present (01/04/2023)   [employer] of Occupational Health - Occupational Stress Questionnaire    Feeling of Stress : Not at all  Recent Concern: Stress - Stress Concern Present (12/01/2022)   [employer] of Occupational Health - Occupational Stress Questionnaire    Feeling of Stress : To some extent  Social Connections: Moderately Isolated (01/04/2023)   Social Connection and Isolation Panel [NHANES]    Frequency of Communication with Friends and Family: More than three times a week    Frequency of Social Gatherings with Friends and Family: More than three times a week    Attends Religious Services: More than 4 times per year    Active Member of [employer] or Organizations: No    Attends Banker Meetings: Never    Marital Status: Never married  Intimate Partner Violence: Not At Risk (01/04/2023)   Humiliation, Afraid, Rape, and Kick questionnaire    Fear of Current or Ex-Partner: No    Emotionally Abused: No    Physically Abused: No    Sexually  Abused: No    FAMILY HISTORY Family History  Problem Relation Age of Onset   Lung cancer Mother        mets to brain, bone, liver   Hypertension Father    Heart attack Brother        at age 45   Renal cancer Maternal Grandfather    Heart attack Paternal Grandfather     ALLERGIES:  is allergic to penicillins.  MEDICATIONS:  Current Outpatient Medications  Medication Sig Dispense Refill   atorvastatin (  LIPITOR) 80 MG tablet Take 1 tablet (80 mg total) by mouth daily. 90 tablet 1   dapagliflozin propanediol (FARXIGA) 10 MG TABS tablet Take 1 tablet (10 mg total) by mouth daily before breakfast. 90 tablet 0   escitalopram (LEXAPRO) 10 MG tablet Take 1 tablet (10 mg total) by mouth daily. 30 tablet 3   levETIRAcetam (KEPPRA) 750 MG tablet Take 1 tablet (750 mg total) by mouth 2 (two) times daily. 60 tablet 0   nitroGLYCERIN (NITROSTAT) 0.4 MG SL tablet Place 1 tablet under the tongue every 5 (five) minutes as needed.  0   warfarin (COUMADIN) 5 MG tablet Take 1.5 tablets (7.5 mg total) by mouth daily. 45 tablet 1   No current facility-administered medications for this visit.    PHYSICAL EXAMINATION:  ECOG PERFORMANCE STATUS: 1 - Symptomatic but completely ambulatory   Vitals:   03/01/23 1143  BP: 121/69  Pulse: 84  Resp: 16  Temp: 98.2 F (36.8 C)  SpO2: 98%      Filed Weights   03/01/23 1143  Weight: 199 lb 3.2 oz (90.4 kg)       Physical Exam Vitals and nursing note reviewed.  Constitutional:      Appearance: Normal appearance. She is not toxic-appearing or diaphoretic.     Comments: Here alone  HENT:     Head: Normocephalic and atraumatic.     Right Ear: External ear normal.     Left Ear: External ear normal.     Nose: Nose normal. No congestion or rhinorrhea.  Eyes:     General: No scleral icterus.    Extraocular Movements: Extraocular movements intact.     Conjunctiva/sclera: Conjunctivae normal.     Pupils: Pupils are equal, round, and reactive to  light.  Cardiovascular:     Rate and Rhythm: Normal rate.     Heart sounds: No murmur heard.    No friction rub. No gallop.  Abdominal:     General: Bowel sounds are normal.     Palpations: Abdomen is soft.  Musculoskeletal:        General: No swelling, tenderness or deformity.     Cervical back: Normal range of motion and neck supple. No rigidity or tenderness.  Lymphadenopathy:     Head:     Right side of head: No submental, submandibular, tonsillar, preauricular, posterior auricular or occipital adenopathy.     Left side of head: No submental, submandibular, tonsillar, preauricular, posterior auricular or occipital adenopathy.     Cervical: No cervical adenopathy.     Right cervical: No superficial, deep or posterior cervical adenopathy.    Left cervical: No superficial, deep or posterior cervical adenopathy.     Upper Body:     Right upper body: No supraclavicular, axillary, pectoral or epitrochlear adenopathy.     Left upper body: No supraclavicular, axillary, pectoral or epitrochlear adenopathy.  Skin:    General: Skin is warm.     Coloration: Skin is not jaundiced.  Neurological:     General: No focal deficit present.     Mental Status: She is alert and oriented to person, place, and time. Mental status is at baseline.     Cranial Nerves: No cranial nerve deficit.  Psychiatric:        Mood and Affect: Mood normal.        Behavior: Behavior normal.        Thought Content: Thought content normal.        Judgment: Judgment normal.  LABORATORY DATA: I have personally reviewed the data as listed:  Appointment on 03/01/2023  Component Date Value Ref Range Status   Prothrombin Time 03/01/2023 28.8 (H)  11.4 - 15.2 seconds Final   INR 03/01/2023 2.7 (H)  0.8 - 1.2 Final   Comment: (NOTE) INR goal varies based on device and disease states. Performed at [facility], [address], [place], [place] [address]    Sodium 03/01/2023 138  135 - 145  mmol/L Final   Potassium 03/01/2023 3.8  3.5 - 5.1 mmol/L Final   Chloride 03/01/2023 108  98 - 111 mmol/L Final   CO2 03/01/2023 23  22 - 32 mmol/L Final   Glucose, Bld 03/01/2023 131 (H)  70 - 99 mg/dL Final   Glucose reference range applies only to samples taken after fasting for at least 8 hours.   BUN 03/01/2023 11  6 - 20 mg/dL Final   Creatinine, Ser 03/01/2023 0.82  0.44 - 1.00 mg/dL Final   Calcium 60/45/4098 8.0 (L)  8.9 - 10.3 mg/dL Final   Total Protein 11/91/4782 7.0  6.5 - 8.1 g/dL Final   Albumin 95/62/1308 2.5 (L)  3.5 - 5.0 g/dL Final   AST 65/78/4696 54 (H)  15 - 41 U/L Final   ALT 03/01/2023 74 (H)  0 - 44 U/L Final   Alkaline Phosphatase 03/01/2023 143 (H)  38 - 126 U/L Final   Total Bilirubin 03/01/2023 0.8  0.3 - 1.2 mg/dL Final   GFR, Estimated 03/01/2023 >60  >60 mL/min Final   Comment: (NOTE) Calculated using the CKD-EPI Creatinine Equation (2021)    Anion gap 03/01/2023 7  5 - 15 Final   Performed at [facility], [address] [place], [place] [address]   Ferritin 03/01/2023 4 (L)  11 - 307 ng/mL Final   Performed at [facility], [address], [place], [place] [address]   WBC 03/01/2023 5.6  4.0 - 10.5 K/uL Final   RBC 03/01/2023 4.50  3.87 - 5.11 MIL/uL Final   Hemoglobin 03/01/2023 9.9 (L)  12.0 - 15.0 g/dL Final   HCT 40/02/2724 34.2 (L)  36.0 - 46.0 % Final   MCV 03/01/2023 76.0 (L)  80.0 - 100.0 fL Final   MCH 03/01/2023 22.0 (L)  26.0 - 34.0 pg Final   MCHC 03/01/2023 28.9 (L)  30.0 - 36.0 g/dL Final   RDW 36/64/4034 15.9 (H)  11.5 - 15.5 % Final   Platelets 03/01/2023 305  150 - 400 K/uL Final   nRBC 03/01/2023 0.0  0.0 - 0.2 % Final   Neutrophils Relative % 03/01/2023 51  % Final   Neutro Abs 03/01/2023 2.8  1.7 - 7.7 K/uL Final   Lymphocytes Relative 03/01/2023 39  % Final   Lymphs Abs 03/01/2023 2.2  0.7 - 4.0 K/uL Final   Monocytes Relative 03/01/2023 8  % Final   Monocytes Absolute 03/01/2023 0.5   0.1 - 1.0 K/uL Final   Eosinophils Relative 03/01/2023 1  % Final   Eosinophils Absolute 03/01/2023 0.1  0.0 - 0.5 K/uL Final   Basophils Relative 03/01/2023 1  % Final   Basophils Absolute 03/01/2023 0.1  0.0 - 0.1 K/uL Final   Immature Granulocytes 03/01/2023 0  % Final   Abs Immature Granulocytes 03/01/2023 0.01  0.00 - 0.07 K/uL Final   Performed at [facility], [address] [place], [place] [address]  Appointment on 02/17/2023  Component Date Value Ref Range  Status   Prothrombin Time 02/17/2023 28.6 (H)  11.4 - 15.2 seconds Final   INR 02/17/2023 2.7 (H)  0.8 - 1.2 Final   Comment: (NOTE) INR goal varies based on device and disease states. Performed at [facility], [address] [place], [place] [address]   Appointment on 02/08/2023  Component Date Value Ref Range Status   Prothrombin Time 02/08/2023 39.0 (H)  11.4 - 15.2 seconds Final   INR 02/08/2023 4.0 (H)  0.8 - 1.2 Final   Comment: (NOTE) INR goal varies based on device and disease states. Performed at [facility] [address] [place], [place] [address]   Appointment on 02/01/2023  Component Date Value Ref Range Status   Prothrombin Time 02/01/2023 19.6 (H)  11.4 - 15.2 seconds Final   INR 02/01/2023 1.6 (H)  0.8 - 1.2 Final   Comment: (NOTE) INR goal varies based on device and disease states. Performed at [facility], [address], [place], [place] [address]     RADIOGRAPHIC STUDIES: I have personally reviewed the radiological images as listed and agree with the findings in the report  No results found.  ASSESSMENT/PLAN   47 y.o. female is here because of antiphospholipid antibody syndrome.  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  Imaging with CT angio and MRI demonstrated M1 occlusion with resultant nonhemorrhagic infarction of right temporal lobe, insular cortex basal ganglia  Cardiac imaging to locate an embolic source was negative.  Biochemical evaluation notable for Beta-2 glycoprotein IgG 109.   Antiphospholipid antibody syndrome: This is defined by the presence of antiphospholipid antibodies in the setting of thrombosis.  Most common sites of venous and arterial thrombosis in the lower limbs and cerebral circulation.  Patient appears to meet diagnostic criteria based on the presence of the beta-2 glycoprotein IgG which is strongly positive in the history of CVA. July 27 2022- Standard of care in terms of management is the use of warfarin with a goal INR of 2.0-3.0 long-term addition of low-dose aspirin is also a reasonable we will therefore transition patient back to Coumadin with flat dosing.  Discussed Coumadin management with patient    August 01 2022- Anti-cardiolipan antibody negative beta-2 glycoprotein antibody IgG 127 IgA 24 IgM undetectable INR 2.2 PTT 54 .  Patient on Coumadin 6 mg daily  August 10 2022- Patient revealed that in November 2023 she was taking Rybelsus; this likely accounted for the large elevation in transaminases and difficulty in managing Coumadin dosing.  September 07 2022- INR 1.1.  Will increase coumdin dose to 9 mg daily.   December 07 2022- INR 3.5 on Coumadin 6 mg daily.  Will instruct patient to decrease Coumadin to 5 mg daily March 01 2023- INR 2.7 on Coumadin 6 mg daily.  Fluctuations in Coumadin dosing may be related to hepatic issues  Anemia  September 07 2022- Likely secondary to chronic blood loss exacerbated by anticoagulation   September 14 2022 through Sep 30 2022: Received Venofer total of 1000 mg   December 02 2022- Hgb 14.8  March 01 2023- Hgb 9.9 MCV 76 Ferritin 4.  Arrange for IV iron.  Arrange for follow up visit to investigate cause of  IDA  Cirrhosis  March 22 2020: Right upper quadrant abdominal U/S showed  a diffusely echogenic hepatic parenchyma   August 14, 2021: Diffuse increased liver echogenicity most commonly seen in setting of fatty infiltration.  Superimposed inflammation and fibrosis not excluded  March 01 2023- Likely due to DM Type II.     Cancer Staging  No matching staging information was found for the patient.    No problem-specific Assessment & Plan notes found for this encounter.    Orders Placed This Encounter  Procedures   CBC with Differential/Platelet    Standing Status:   Future    Number of Occurrences:   1    Standing Expiration Date:   02/29/2024   Ferritin    Standing Status:   Future    Number of Occurrences:   1    Standing Expiration Date:   02/29/2024   Comprehensive metabolic panel    Standing Status:   Future    Number of Occurrences:   1    Standing Expiration Date:   02/29/2024   Protime-INR    Standing Status:   Future    Number of Occurrences:   1    Standing Expiration Date:   02/29/2024    30  minutes was spent in patient care.  This included time spent preparing to see the patient (e.g., review of tests), obtaining and/or reviewing separately obtained history, counseling and educating the patient, ordering medications, tests, or procedures; documenting clinical information in the electronic or other health record, independently interpreting results and communicating results to the patient as well as coordination of care.       All questions were answered. The patient knows to call the clinic with any problems, questions or concerns.  This note was electronically signed.    Loni Muse, MD  03/01/2023 4:44 PM

## 2023-03-06 NOTE — Assessment & Plan Note (Signed)
Management per specialist.  The current medical regimen is effective;  continue present plan and medications. Keppra and gabapentin.

## 2023-03-06 NOTE — Assessment & Plan Note (Signed)
Controlled with diet and exercise.  Labs reviewed that were done recently. Continue farxiga will increase to 10 mg daily.

## 2023-03-06 NOTE — Progress Notes (Unsigned)
Subjective:  Patient ID: Shannon Erickson, female    DOB: 06-09-1975  Age: 47 y.o. MRN: [id number]  Chief Complaint  Patient presents with   Medical Management of Chronic Issues    HPI Hyperlipidemia: On Atorvastatin 80 mg daily without problems.    Hypertension: no antihypertensives. Patient is working on maintaining diet and exercise regimen and follows up as directed.   DMII: taking farxiga 10 mg daily, checks feet daily, does not check FBS, due for eye exam, but did have a normal retinal screening in our office in 01/2022. A1C 6.8. Eating healthy: chicken and fruits and vegetables. Does not eat sweets.    Seizure disorder: She is taking Keppra 750 mg twice a day. No seizures.  Sees [facility].  Dr.Sethi as needed.    Antiphospholipid syndrome: on warfarin 5 mg daily. Patient was referred to hematology for management. Patient was noncompliant with coming monthly. We considered changing her to a NOAC, but will defer to Dr. Constance Goltz. Has appt 12/06/22 with him.   Depression/Anxiety: on lexapro 10 mg daily.         03/07/2023    2:00 PM 01/04/2023    3:14 PM 12/02/2022   10:42 AM 08/03/2022   10:35 AM 12/29/2021   10:15 AM  Depression screen PHQ 2/9  Decreased Interest 0 1 1 0 0  Down, Depressed, Hopeless 0 0 0 0 0  PHQ - 2 Score 0 1 1 0 0  Altered sleeping 1 1 1     Tired, decreased energy 1 1 1     Change in appetite 0 0 0    Feeling bad or failure about yourself  0 0 0    Trouble concentrating 0 1 1    Moving slowly or fidgety/restless 0 0 0    Suicidal thoughts 0 0 0    PHQ-9 Score 2 4 4     Difficult doing work/chores Not difficult at all Somewhat difficult Somewhat difficult          03/07/2023    2:01 PM  Fall Risk   Falls in the past year? 1  Number falls in past yr: 0  Injury with Fall? 0  Risk for fall due to : Impaired balance/gait;Impaired mobility  Follow up Falls evaluation completed    Patient Care Team: Blane Ohara, MD as PCP - General  (Family Medicine) Loni Muse, MD as Consulting Physician (Internal Medicine)   Review of Systems  Constitutional:  Negative for chills, fatigue and fever.  HENT:  Negative for congestion, ear pain, rhinorrhea and sore throat.   Respiratory:  Negative for cough and shortness of breath.   Cardiovascular:  Negative for chest pain.  Gastrointestinal:  Negative for abdominal pain, constipation, diarrhea, nausea and vomiting.  Genitourinary:  Negative for dysuria and urgency.  Musculoskeletal:  Negative for back pain and myalgias.  Neurological:  Negative for dizziness, weakness, light-headedness and headaches.  Psychiatric/Behavioral:  Negative for dysphoric mood. The patient is not nervous/anxious.     Current Outpatient Medications on File Prior to Visit  Medication Sig Dispense Refill   atorvastatin (LIPITOR) 80 MG tablet Take 1 tablet (80 mg total) by mouth daily. 90 tablet 1   dapagliflozin propanediol (FARXIGA) 10 MG TABS tablet Take 1 tablet (10 mg total) by mouth daily before breakfast. 90 tablet 0   escitalopram (LEXAPRO) 10 MG tablet Take 1 tablet (10 mg total) by mouth daily. 30 tablet 3   levETIRAcetam (KEPPRA) 750 MG tablet Take 1 tablet (750 mg  total) by mouth 2 (two) times daily. 60 tablet 0   nitroGLYCERIN (NITROSTAT) 0.4 MG SL tablet Place 1 tablet under the tongue every 5 (five) minutes as needed.  0   warfarin (COUMADIN) 5 MG tablet Take 1.5 tablets (7.5 mg total) by mouth daily. (Patient taking differently: Take 5 mg by mouth daily.) 45 tablet 1   No current facility-administered medications on file prior to visit.   Past Medical History:  Diagnosis Date   Hypertension    Lesion of left ulnar nerve    Migraine    Seizures (HCC)    Stroke [facility])    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) 12/10/2016   Past Surgical History:  Procedure Laterality Date   CESAREAN SECTION Bilateral 1995, 2002   CHOLECYSTECTOMY     LYMPH NODE BIOPSY      MOUTH SURGERY Left 07/04/2021   TEE WITHOUT CARDIOVERSION N/A 12/05/2016   Procedure: TRANSESOPHAGEAL ECHOCARDIOGRAM (TEE);  Surgeon: Elease Hashimoto, Deloris Ping, MD;  Location: [facility] ENDOSCOPY;  Service: Cardiovascular;  Laterality: N/A;    Family History  Problem Relation Age of Onset   Lung cancer Mother        mets to brain, bone, liver   Hypertension Father    Heart attack Brother        at age 76   Renal cancer Maternal Grandfather    Heart attack Paternal Grandfather    Social History   Socioeconomic History   Marital status: Single    Spouse name: Not on file   Number of children: 3   Years of education: 12   Highest education level: GED or equivalent  Occupational History   Not on file  Tobacco Use   Smoking status: Never   Smokeless tobacco: Never  Vaping Use   Vaping status: Never Used  Substance and Sexual Activity   Alcohol use: No   Drug use: No   Sexual activity: Not Currently    Birth control/protection: Other-see comments  Other Topics Concern   Not on file  Social History Narrative   Not on file   Social Determinants of Health   Financial Resource Strain: Medium Risk (03/06/2023)   Overall Financial Resource Strain (CARDIA)    Difficulty of Paying Living Expenses: Somewhat hard  Food Insecurity: Food Insecurity Present (03/06/2023)   Hunger Vital Sign    Worried About Running Out of Food in the Last Year: Sometimes true    Ran Out of Food in the Last Year: Sometimes true  Transportation Needs: No Transportation Needs (03/06/2023)   PRAPARE - Administrator, Civil Service (Medical): No    Lack of Transportation (Non-Medical): No  Physical Activity: Insufficiently Active (03/06/2023)   Exercise Vital Sign    Days of Exercise per Week: 5 days    Minutes of Exercise per Session: 10 min  Stress: No Stress Concern Present (03/06/2023)   [employer] of Occupational Health - Occupational Stress Questionnaire    Feeling of Stress : Only a little   Social Connections: Moderately Integrated (03/06/2023)   Social Connection and Isolation Panel [NHANES]    Frequency of Communication with Friends and Family: More than three times a week    Frequency of Social Gatherings with Friends and Family: Three times a week    Attends Religious Services: More than 4 times per year    Active Member of Clubs or Organizations: Yes    Attends Banker Meetings: 1 to 4 times per  year    Marital Status: Divorced  Recent Concern: Social Connections - Moderately Isolated (01/04/2023)   Social Connection and Isolation Panel [NHANES]    Frequency of Communication with Friends and Family: More than three times a week    Frequency of Social Gatherings with Friends and Family: More than three times a week    Attends Religious Services: More than 4 times per year    Active Member of [employer] or Organizations: No    Attends Engineer, structural: Never    Marital Status: Never married    Objective:  BP 118/70   Pulse (!) 105   Temp (!) 97.2 F (36.2 C)   Ht 5\' 3"  (1.6 m)   Wt 199 lb (90.3 kg)   SpO2 98%   BMI 35.25 kg/m      03/07/2023    1:44 PM 03/01/2023   11:43 AM 12/07/2022    2:18 PM  BP/Weight  Systolic BP 118 121 125  Diastolic BP 70 69 60  Wt. (Lbs) 199 199.2 199.2  BMI 35.25 kg/m2 35.29 kg/m2 35.29 kg/m2    Physical Exam Vitals reviewed.  Constitutional:      Appearance: Normal appearance. She is obese.  Neck:     Vascular: No carotid bruit.  Cardiovascular:     Rate and Rhythm: Normal rate and regular rhythm.     Heart sounds: Normal heart sounds.  Pulmonary:     Effort: Pulmonary effort is normal. No respiratory distress.     Breath sounds: Normal breath sounds.  Abdominal:     General: Abdomen is flat. Bowel sounds are normal.     Palpations: Abdomen is soft.     Tenderness: There is no abdominal tenderness.  Neurological:     Mental Status: She is alert and oriented to person, place, and time.      Gait: Gait abnormal (left sided weakness. uses a cane.).  Psychiatric:        Mood and Affect: Mood normal.        Behavior: Behavior normal.     Diabetic Foot Exam - Simple   Simple Foot Form Diabetic Foot exam was performed with the following findings: Yes 03/07/2023  2:12 PM  Visual Inspection No deformities, no ulcerations, no other skin breakdown bilaterally: Yes Sensation Testing See comments: Yes Pulse Check Posterior Tibialis and Dorsalis pulse intact bilaterally: Yes Comments Decreased sensation BL feet.       Lab Results  Component Value Date   WBC 5.6 03/01/2023   HGB 9.9 (L) 03/01/2023   HCT 34.2 (L) 03/01/2023   PLT 305 03/01/2023   GLUCOSE 131 (H) 03/01/2023   CHOL 137 03/07/2023   TRIG 102 03/07/2023   HDL 46 03/07/2023   LDLCALC 72 03/07/2023   ALT 74 (H) 03/01/2023   AST 54 (H) 03/01/2023   NA 138 03/01/2023   K 3.8 03/01/2023   CL 108 03/01/2023   CREATININE 0.82 03/01/2023   BUN 11 03/01/2023   CO2 23 03/01/2023   TSH 1.150 12/14/2021   INR 2.7 (H) 03/01/2023   HGBA1C 6.4 (H) 03/07/2023   MICROALBUR 30 03/22/2021      Assessment & Plan:    Seizure disorder [facility]) Assessment & Plan: Management per specialist.  The current medical regimen is effective;  continue present plan and medications. Keppra and gabapentin.   Mixed hyperlipidemia Assessment & Plan: Well controlled.  Continue Lipitor 80 mg. Continue to work on eating a healthy diet and exercise.  Labs drawn  today.    Orders: -     Lipid panel  Antiphospholipid syndrome (HCC) Assessment & Plan: Continue warfarin 6 mg daily.  Management per specialist.      Hypertension associated with diabetes (HCC) Assessment & Plan: Controlled with diet and exercise.  Labs reviewed that were done recently. Continue farxiga will increase to 10 mg daily.  Orders: -     Hemoglobin A1c  Class 2 severe obesity due to excess calories with serious comorbidity and body mass index (BMI)  of 35.0 to 35.9 in adult [facility]) Assessment & Plan: Recommend continue to work on eating healthy diet and exercise.  Comorbidities: hypertension and diabetes   Hemiparesis affecting left side as late effect of cerebrovascular accident [facility]) Assessment & Plan: Patient compensates well for disabilities.      No orders of the defined types were placed in this encounter.   Orders Placed This Encounter  Procedures   Hemoglobin A1c   Lipid panel     Follow-up: Return in about 3 months (around 06/07/2023) for chronic follow up, 40 minutes please.   I,Katherina A Bramblett,acting as a scribe for Blane Ohara, MD.,have documented all relevant documentation on the behalf of Blane Ohara, MD,as directed by  Blane Ohara, MD while in the presence of Blane Ohara, MD.   Clayborn Bigness I Leal-Borjas,acting as a scribe for Blane Ohara, MD.,have documented all relevant documentation on the behalf of Blane Ohara, MD,as directed by  Blane Ohara, MD while in the presence of Blane Ohara, MD.    An After Visit Summary was printed and given to the patient.  I attest that I have reviewed this visit and agree with the plan scribed by my staff.   Blane Ohara, MD [facility] [phone]

## 2023-03-06 NOTE — Assessment & Plan Note (Signed)
Well controlled.  Continue Lipitor 80 mg. Continue to work on eating a healthy diet and exercise.  Labs drawn today.

## 2023-03-06 NOTE — Assessment & Plan Note (Signed)
Control: Controlled Recommend check sugars fasting daily. Recommend check feet daily. Recommend annual eye exams. Medicines:  taking farxiga 5 mg daily  Continue to work on eating a healthy diet and exercise.  Labs drawn today.

## 2023-03-06 NOTE — Assessment & Plan Note (Signed)
Continue warfarin 6 mg daily.  Management per specialist.

## 2023-03-07 ENCOUNTER — Encounter: Payer: Self-pay | Admitting: Family Medicine

## 2023-03-07 ENCOUNTER — Ambulatory Visit: Payer: No Typology Code available for payment source | Admitting: Family Medicine

## 2023-03-07 VITALS — BP 118/70 | HR 105 | Temp 97.2°F | Ht 63.0 in | Wt 199.0 lb

## 2023-03-07 DIAGNOSIS — E782 Mixed hyperlipidemia: Secondary | ICD-10-CM | POA: Diagnosis not present

## 2023-03-07 DIAGNOSIS — E66812 Obesity, class 2: Secondary | ICD-10-CM | POA: Diagnosis not present

## 2023-03-07 DIAGNOSIS — E1159 Type 2 diabetes mellitus with other circulatory complications: Secondary | ICD-10-CM | POA: Diagnosis not present

## 2023-03-07 DIAGNOSIS — G40909 Epilepsy, unspecified, not intractable, without status epilepticus: Secondary | ICD-10-CM | POA: Diagnosis not present

## 2023-03-07 DIAGNOSIS — Z6835 Body mass index (BMI) 35.0-35.9, adult: Secondary | ICD-10-CM

## 2023-03-07 DIAGNOSIS — I152 Hypertension secondary to endocrine disorders: Secondary | ICD-10-CM

## 2023-03-07 DIAGNOSIS — I69354 Hemiplegia and hemiparesis following cerebral infarction affecting left non-dominant side: Secondary | ICD-10-CM

## 2023-03-07 DIAGNOSIS — D6861 Antiphospholipid syndrome: Secondary | ICD-10-CM | POA: Diagnosis not present

## 2023-03-07 DIAGNOSIS — E66811 Obesity, class 1: Secondary | ICD-10-CM | POA: Insufficient documentation

## 2023-03-08 LAB — HEMOGLOBIN A1C
Est. average glucose Bld gHb Est-mCnc: 137 mg/dL
Hgb A1c MFr Bld: 6.4 % — ABNORMAL HIGH (ref 4.8–5.6)

## 2023-03-08 LAB — LIPID PANEL
Chol/HDL Ratio: 3 {ratio} (ref 0.0–4.4)
Cholesterol, Total: 137 mg/dL (ref 100–199)
HDL: 46 mg/dL (ref >39)
LDL Chol Calc (NIH): 72 mg/dL (ref 0–99)
Triglycerides: 102 mg/dL (ref 0–149)
VLDL Cholesterol Cal: 19 mg/dL (ref 5–40)

## 2023-03-11 NOTE — Assessment & Plan Note (Signed)
Recommend continue to work on eating healthy diet and exercise.  

## 2023-03-12 NOTE — Assessment & Plan Note (Signed)
Patient compensates well for disabilities.

## 2023-03-14 ENCOUNTER — Other Ambulatory Visit: Payer: Self-pay | Admitting: Oncology

## 2023-03-14 DIAGNOSIS — D509 Iron deficiency anemia, unspecified: Secondary | ICD-10-CM

## 2023-03-14 NOTE — Addendum Note (Signed)
Addended by: Leatha Gilding on: 03/14/2023 10:49 AM   Modules accepted: Orders

## 2023-03-29 ENCOUNTER — Other Ambulatory Visit: Payer: Self-pay

## 2023-03-29 DIAGNOSIS — E669 Obesity, unspecified: Secondary | ICD-10-CM

## 2023-03-30 ENCOUNTER — Encounter: Payer: Self-pay | Admitting: Oncology

## 2023-03-30 DIAGNOSIS — D509 Iron deficiency anemia, unspecified: Secondary | ICD-10-CM | POA: Insufficient documentation

## 2023-03-30 MED ORDER — DAPAGLIFLOZIN PROPANEDIOL 10 MG PO TABS: 10.0000 mg | ORAL_TABLET | Freq: Every day | ORAL | 0 refills | Status: DC

## 2023-04-03 ENCOUNTER — Inpatient Hospital Stay: Payer: No Typology Code available for payment source | Attending: Oncology

## 2023-04-03 VITALS — BP 119/70 | HR 90 | Temp 98.0°F | Resp 18 | Ht 63.0 in | Wt 198.0 lb

## 2023-04-03 DIAGNOSIS — D509 Iron deficiency anemia, unspecified: Secondary | ICD-10-CM | POA: Diagnosis not present

## 2023-04-03 MED ORDER — ACETAMINOPHEN 325 MG PO TABS
650.0000 mg | ORAL_TABLET | Freq: Once | ORAL | Status: AC
  Administered 2023-04-03: 650 mg via ORAL
  Filled 2023-04-03: qty 2

## 2023-04-03 MED ORDER — LORATADINE 10 MG PO TABS
10.0000 mg | ORAL_TABLET | Freq: Once | ORAL | Status: AC
Start: 2023-04-03 — End: 2023-04-03
  Administered 2023-04-03: 10 mg via ORAL
  Filled 2023-04-03: qty 1

## 2023-04-03 MED ORDER — IRON SUCROSE 20 MG/ML IV SOLN
200.0000 mg | Freq: Once | INTRAVENOUS | Status: AC
  Administered 2023-04-03: 200 mg via INTRAVENOUS
  Filled 2023-04-03: qty 10

## 2023-04-03 MED ORDER — SODIUM CHLORIDE 0.9% FLUSH
10.0000 mL | Freq: Two times a day (BID) | INTRAVENOUS | Status: DC
  Administered 2023-04-03: 10 mL via INTRAVENOUS

## 2023-04-03 NOTE — Patient Instructions (Signed)
Iron Sucrose Injection What is this medication? IRON SUCROSE (EYE ern SOO krose) treats low levels of iron (iron deficiency anemia) in people with kidney disease. Iron is a mineral that plays an important role in making red blood cells, which carry oxygen from your lungs to the rest of your body. This medicine may be used for other purposes; ask your health care provider or pharmacist if you have questions. COMMON BRAND NAME(S): Venofer What should I tell my care team before I take this medication? They need to know if you have any of these conditions: Anemia not caused by low iron levels Heart disease High levels of iron in the blood Kidney disease Liver disease An unusual or allergic reaction to iron, other medications, foods, dyes, or preservatives Pregnant or trying to get pregnant Breastfeeding How should I use this medication? This medication is for infusion into a vein. It is given in a hospital or clinic setting. Talk to your care team about the use of this medication in children. While this medication may be prescribed for children as young as 2 years for selected conditions, precautions do apply. Overdosage: If you think you have taken too much of this medicine contact a poison control center or emergency room at once. NOTE: This medicine is only for you. Do not share this medicine with others. What if I miss a dose? Keep appointments for follow-up doses. It is important not to miss your dose. Call your care team if you are unable to keep an appointment. What may interact with this medication? Do not take this medication with any of the following: Deferoxamine Dimercaprol Other iron products This medication may also interact with the following: Chloramphenicol Deferasirox This list may not describe all possible interactions. Give your health care provider a list of all the medicines, herbs, non-prescription drugs, or dietary supplements you use. Also tell them if you smoke,  drink alcohol, or use illegal drugs. Some items may interact with your medicine. What should I watch for while using this medication? Visit your care team regularly. Tell your care team if your symptoms do not start to get better or if they get worse. You may need blood work done while you are taking this medication. You may need to follow a special diet. Talk to your care team. Foods that contain iron include: whole grains/cereals, dried fruits, beans, or peas, leafy green vegetables, and organ meats (liver, kidney). What side effects may I notice from receiving this medication? Side effects that you should report to your care team as soon as possible: Allergic reactions--skin rash, itching, hives, swelling of the face, lips, tongue, or throat Low blood pressure--dizziness, feeling faint or lightheaded, blurry vision Shortness of breath Side effects that usually do not require medical attention (report to your care team if they continue or are bothersome): Flushing Headache Joint pain Muscle pain Nausea Pain, redness, or irritation at injection site This list may not describe all possible side effects. Call your doctor for medical advice about side effects. You may report side effects to FDA at 1-800-FDA-1088. Where should I keep my medication? This medication is given in a hospital or clinic. It will not be stored at home. NOTE: This sheet is a summary. It may not cover all possible information. If you have questions about this medicine, talk to your doctor, pharmacist, or health care provider.  2024 Elsevier/Gold Standard (2022-10-14 00:00:00)

## 2023-04-04 ENCOUNTER — Other Ambulatory Visit: Payer: Self-pay

## 2023-04-04 ENCOUNTER — Inpatient Hospital Stay: Payer: No Typology Code available for payment source

## 2023-04-04 ENCOUNTER — Encounter: Payer: Self-pay | Admitting: Oncology

## 2023-04-04 VITALS — BP 94/61 | HR 89 | Temp 98.1°F | Resp 18

## 2023-04-04 DIAGNOSIS — G40909 Epilepsy, unspecified, not intractable, without status epilepticus: Secondary | ICD-10-CM

## 2023-04-04 DIAGNOSIS — D509 Iron deficiency anemia, unspecified: Secondary | ICD-10-CM | POA: Diagnosis not present

## 2023-04-04 MED ORDER — SODIUM CHLORIDE 0.9% FLUSH
10.0000 mL | Freq: Two times a day (BID) | INTRAVENOUS | Status: DC
  Administered 2023-04-04: 10 mL via INTRAVENOUS

## 2023-04-04 MED ORDER — ACETAMINOPHEN 325 MG PO TABS
650.0000 mg | ORAL_TABLET | Freq: Once | ORAL | Status: AC
  Administered 2023-04-04: 650 mg via ORAL
  Filled 2023-04-04: qty 2

## 2023-04-04 MED ORDER — IRON SUCROSE 20 MG/ML IV SOLN
200.0000 mg | Freq: Once | INTRAVENOUS | Status: AC
  Administered 2023-04-04: 200 mg via INTRAVENOUS
  Filled 2023-04-04: qty 10

## 2023-04-04 MED ORDER — LORATADINE 10 MG PO TABS
10.0000 mg | ORAL_TABLET | Freq: Once | ORAL | Status: AC
  Administered 2023-04-04: 10 mg via ORAL
  Filled 2023-04-04: qty 1

## 2023-04-04 NOTE — Patient Instructions (Signed)
Iron Sucrose Injection What is this medication? IRON SUCROSE (EYE ern SOO krose) treats low levels of iron (iron deficiency anemia) in people with kidney disease. Iron is a mineral that plays an important role in making red blood cells, which carry oxygen from your lungs to the rest of your body. This medicine may be used for other purposes; ask your health care provider or pharmacist if you have questions. COMMON BRAND NAME(S): Venofer What should I tell my care team before I take this medication? They need to know if you have any of these conditions: Anemia not caused by low iron levels Heart disease High levels of iron in the blood Kidney disease Liver disease An unusual or allergic reaction to iron, other medications, foods, dyes, or preservatives Pregnant or trying to get pregnant Breastfeeding How should I use this medication? This medication is for infusion into a vein. It is given in a hospital or clinic setting. Talk to your care team about the use of this medication in children. While this medication may be prescribed for children as young as 2 years for selected conditions, precautions do apply. Overdosage: If you think you have taken too much of this medicine contact a poison control center or emergency room at once. NOTE: This medicine is only for you. Do not share this medicine with others. What if I miss a dose? Keep appointments for follow-up doses. It is important not to miss your dose. Call your care team if you are unable to keep an appointment. What may interact with this medication? Do not take this medication with any of the following: Deferoxamine Dimercaprol Other iron products This medication may also interact with the following: Chloramphenicol Deferasirox This list may not describe all possible interactions. Give your health care provider a list of all the medicines, herbs, non-prescription drugs, or dietary supplements you use. Also tell them if you smoke,  drink alcohol, or use illegal drugs. Some items may interact with your medicine. What should I watch for while using this medication? Visit your care team regularly. Tell your care team if your symptoms do not start to get better or if they get worse. You may need blood work done while you are taking this medication. You may need to follow a special diet. Talk to your care team. Foods that contain iron include: whole grains/cereals, dried fruits, beans, or peas, leafy green vegetables, and organ meats (liver, kidney). What side effects may I notice from receiving this medication? Side effects that you should report to your care team as soon as possible: Allergic reactions--skin rash, itching, hives, swelling of the face, lips, tongue, or throat Low blood pressure--dizziness, feeling faint or lightheaded, blurry vision Shortness of breath Side effects that usually do not require medical attention (report to your care team if they continue or are bothersome): Flushing Headache Joint pain Muscle pain Nausea Pain, redness, or irritation at injection site This list may not describe all possible side effects. Call your doctor for medical advice about side effects. You may report side effects to FDA at 1-800-FDA-1088. Where should I keep my medication? This medication is given in a hospital or clinic. It will not be stored at home. NOTE: This sheet is a summary. It may not cover all possible information. If you have questions about this medicine, talk to your doctor, pharmacist, or health care provider.  2024 Elsevier/Gold Standard (2022-10-14 00:00:00)

## 2023-04-05 ENCOUNTER — Inpatient Hospital Stay: Payer: No Typology Code available for payment source

## 2023-04-05 ENCOUNTER — Encounter: Payer: Self-pay | Admitting: Oncology

## 2023-04-05 VITALS — BP 131/73 | HR 88 | Temp 97.8°F | Resp 18

## 2023-04-05 DIAGNOSIS — D509 Iron deficiency anemia, unspecified: Secondary | ICD-10-CM | POA: Diagnosis not present

## 2023-04-05 MED ORDER — LORATADINE 10 MG PO TABS
10.0000 mg | ORAL_TABLET | Freq: Once | ORAL | Status: AC
  Administered 2023-04-05: 10 mg via ORAL
  Filled 2023-04-05: qty 1

## 2023-04-05 MED ORDER — ACETAMINOPHEN 325 MG PO TABS
650.0000 mg | ORAL_TABLET | Freq: Once | ORAL | Status: AC
Start: 2023-04-05 — End: 2023-04-05
  Administered 2023-04-05: 650 mg via ORAL
  Filled 2023-04-05: qty 2

## 2023-04-05 MED ORDER — SODIUM CHLORIDE 0.9% FLUSH: 10.0000 mL | Freq: Two times a day (BID) | INTRAVENOUS | Status: DC

## 2023-04-05 MED ORDER — IRON SUCROSE 20 MG/ML IV SOLN
200.0000 mg | Freq: Once | INTRAVENOUS | Status: AC
  Administered 2023-04-05: 200 mg via INTRAVENOUS
  Filled 2023-04-05: qty 10

## 2023-04-05 NOTE — Patient Instructions (Signed)
Iron Sucrose Injection What is this medication? IRON SUCROSE (EYE ern SOO krose) treats low levels of iron (iron deficiency anemia) in people with kidney disease. Iron is a mineral that plays an important role in making red blood cells, which carry oxygen from your lungs to the rest of your body. This medicine may be used for other purposes; ask your health care provider or pharmacist if you have questions. COMMON BRAND NAME(S): Venofer What should I tell my care team before I take this medication? They need to know if you have any of these conditions: Anemia not caused by low iron levels Heart disease High levels of iron in the blood Kidney disease Liver disease An unusual or allergic reaction to iron, other medications, foods, dyes, or preservatives Pregnant or trying to get pregnant Breastfeeding How should I use this medication? This medication is for infusion into a vein. It is given in a hospital or clinic setting. Talk to your care team about the use of this medication in children. While this medication may be prescribed for children as young as 2 years for selected conditions, precautions do apply. Overdosage: If you think you have taken too much of this medicine contact a poison control center or emergency room at once. NOTE: This medicine is only for you. Do not share this medicine with others. What if I miss a dose? Keep appointments for follow-up doses. It is important not to miss your dose. Call your care team if you are unable to keep an appointment. What may interact with this medication? Do not take this medication with any of the following: Deferoxamine Dimercaprol Other iron products This medication may also interact with the following: Chloramphenicol Deferasirox This list may not describe all possible interactions. Give your health care provider a list of all the medicines, herbs, non-prescription drugs, or dietary supplements you use. Also tell them if you smoke,  drink alcohol, or use illegal drugs. Some items may interact with your medicine. What should I watch for while using this medication? Visit your care team regularly. Tell your care team if your symptoms do not start to get better or if they get worse. You may need blood work done while you are taking this medication. You may need to follow a special diet. Talk to your care team. Foods that contain iron include: whole grains/cereals, dried fruits, beans, or peas, leafy green vegetables, and organ meats (liver, kidney). What side effects may I notice from receiving this medication? Side effects that you should report to your care team as soon as possible: Allergic reactions--skin rash, itching, hives, swelling of the face, lips, tongue, or throat Low blood pressure--dizziness, feeling faint or lightheaded, blurry vision Shortness of breath Side effects that usually do not require medical attention (report to your care team if they continue or are bothersome): Flushing Headache Joint pain Muscle pain Nausea Pain, redness, or irritation at injection site This list may not describe all possible side effects. Call your doctor for medical advice about side effects. You may report side effects to FDA at 1-800-FDA-1088. Where should I keep my medication? This medication is given in a hospital or clinic. It will not be stored at home. NOTE: This sheet is a summary. It may not cover all possible information. If you have questions about this medicine, talk to your doctor, pharmacist, or health care provider.  2024 Elsevier/Gold Standard (2022-10-14 00:00:00)

## 2023-04-06 ENCOUNTER — Encounter: Payer: Self-pay | Admitting: Oncology

## 2023-04-06 ENCOUNTER — Telehealth: Payer: Self-pay | Admitting: Oncology

## 2023-04-06 ENCOUNTER — Ambulatory Visit: Payer: No Typology Code available for payment source

## 2023-04-06 NOTE — Telephone Encounter (Signed)
04/06/23 Patient requested to reschedule IV IRON.All appts rescheeduled and confirmed with patient.

## 2023-04-07 ENCOUNTER — Inpatient Hospital Stay: Payer: No Typology Code available for payment source

## 2023-04-24 ENCOUNTER — Encounter: Payer: Self-pay | Admitting: Oncology

## 2023-04-25 ENCOUNTER — Inpatient Hospital Stay: Payer: No Typology Code available for payment source | Attending: Oncology

## 2023-04-25 ENCOUNTER — Other Ambulatory Visit: Payer: Self-pay | Admitting: Family Medicine

## 2023-04-25 VITALS — BP 106/69 | HR 74 | Temp 97.7°F | Resp 18 | Ht 63.0 in | Wt 203.0 lb

## 2023-04-25 DIAGNOSIS — D509 Iron deficiency anemia, unspecified: Secondary | ICD-10-CM | POA: Insufficient documentation

## 2023-04-25 DIAGNOSIS — I152 Hypertension secondary to endocrine disorders: Secondary | ICD-10-CM

## 2023-04-25 MED ORDER — IRON SUCROSE 20 MG/ML IV SOLN
200.0000 mg | Freq: Once | INTRAVENOUS | Status: AC
Start: 2023-04-25 — End: 2023-04-25
  Administered 2023-04-25: 200 mg via INTRAVENOUS
  Filled 2023-04-25: qty 10

## 2023-04-25 MED ORDER — LORATADINE 10 MG PO TABS
10.0000 mg | ORAL_TABLET | Freq: Once | ORAL | Status: AC
  Administered 2023-04-25: 10 mg via ORAL
  Filled 2023-04-25: qty 1

## 2023-04-25 MED ORDER — ACETAMINOPHEN 325 MG PO TABS
650.0000 mg | ORAL_TABLET | Freq: Once | ORAL | Status: AC
  Administered 2023-04-25: 650 mg via ORAL
  Filled 2023-04-25: qty 2

## 2023-04-25 MED ORDER — SODIUM CHLORIDE 0.9% FLUSH
10.0000 mL | Freq: Two times a day (BID) | INTRAVENOUS | Status: DC
  Administered 2023-04-25: 10 mL via INTRAVENOUS

## 2023-04-26 MED ORDER — DAPAGLIFLOZIN PROPANEDIOL 10 MG PO TABS: 10.0000 mg | ORAL_TABLET | Freq: Every day | ORAL | 0 refills | Status: DC

## 2023-04-27 ENCOUNTER — Inpatient Hospital Stay: Payer: No Typology Code available for payment source

## 2023-04-27 VITALS — BP 117/71 | HR 80 | Temp 98.1°F | Resp 20 | Ht 63.0 in | Wt 203.1 lb

## 2023-04-27 DIAGNOSIS — D509 Iron deficiency anemia, unspecified: Secondary | ICD-10-CM | POA: Diagnosis not present

## 2023-04-27 MED ORDER — SODIUM CHLORIDE 0.9% FLUSH
10.0000 mL | Freq: Two times a day (BID) | INTRAVENOUS | Status: DC
  Administered 2023-04-27: 10 mL via INTRAVENOUS

## 2023-04-27 MED ORDER — IRON SUCROSE 20 MG/ML IV SOLN
200.0000 mg | Freq: Once | INTRAVENOUS | Status: AC
  Administered 2023-04-27: 200 mg via INTRAVENOUS
  Filled 2023-04-27: qty 10

## 2023-04-27 MED ORDER — ACETAMINOPHEN 325 MG PO TABS
650.0000 mg | ORAL_TABLET | Freq: Once | ORAL | Status: AC
  Administered 2023-04-27: 650 mg via ORAL
  Filled 2023-04-27: qty 2

## 2023-04-27 MED ORDER — LORATADINE 10 MG PO TABS
10.0000 mg | ORAL_TABLET | Freq: Once | ORAL | Status: AC
  Administered 2023-04-27: 10 mg via ORAL
  Filled 2023-04-27: qty 1

## 2023-04-27 NOTE — Patient Instructions (Signed)
Iron Sucrose Injection What is this medication? IRON SUCROSE (EYE ern SOO krose) treats low levels of iron (iron deficiency anemia) in people with kidney disease. Iron is a mineral that plays an important role in making red blood cells, which carry oxygen from your lungs to the rest of your body. This medicine may be used for other purposes; ask your health care provider or pharmacist if you have questions. COMMON BRAND NAME(S): Venofer What should I tell my care team before I take this medication? They need to know if you have any of these conditions: Anemia not caused by low iron levels Heart disease High levels of iron in the blood Kidney disease Liver disease An unusual or allergic reaction to iron, other medications, foods, dyes, or preservatives Pregnant or trying to get pregnant Breastfeeding How should I use this medication? This medication is for infusion into a vein. It is given in a hospital or clinic setting. Talk to your care team about the use of this medication in children. While this medication may be prescribed for children as young as 2 years for selected conditions, precautions do apply. Overdosage: If you think you have taken too much of this medicine contact a poison control center or emergency room at once. NOTE: This medicine is only for you. Do not share this medicine with others. What if I miss a dose? Keep appointments for follow-up doses. It is important not to miss your dose. Call your care team if you are unable to keep an appointment. What may interact with this medication? Do not take this medication with any of the following: Deferoxamine Dimercaprol Other iron products This medication may also interact with the following: Chloramphenicol Deferasirox This list may not describe all possible interactions. Give your health care provider a list of all the medicines, herbs, non-prescription drugs, or dietary supplements you use. Also tell them if you smoke,  drink alcohol, or use illegal drugs. Some items may interact with your medicine. What should I watch for while using this medication? Visit your care team regularly. Tell your care team if your symptoms do not start to get better or if they get worse. You may need blood work done while you are taking this medication. You may need to follow a special diet. Talk to your care team. Foods that contain iron include: whole grains/cereals, dried fruits, beans, or peas, leafy green vegetables, and organ meats (liver, kidney). What side effects may I notice from receiving this medication? Side effects that you should report to your care team as soon as possible: Allergic reactions--skin rash, itching, hives, swelling of the face, lips, tongue, or throat Low blood pressure--dizziness, feeling faint or lightheaded, blurry vision Shortness of breath Side effects that usually do not require medical attention (report to your care team if they continue or are bothersome): Flushing Headache Joint pain Muscle pain Nausea Pain, redness, or irritation at injection site This list may not describe all possible side effects. Call your doctor for medical advice about side effects. You may report side effects to FDA at 1-800-FDA-1088. Where should I keep my medication? This medication is given in a hospital or clinic. It will not be stored at home. NOTE: This sheet is a summary. It may not cover all possible information. If you have questions about this medicine, talk to your doctor, pharmacist, or health care provider.  2024 Elsevier/Gold Standard (2022-10-14 00:00:00)

## 2023-05-03 ENCOUNTER — Other Ambulatory Visit: Payer: Self-pay | Admitting: Family Medicine

## 2023-05-03 DIAGNOSIS — I152 Hypertension secondary to endocrine disorders: Secondary | ICD-10-CM

## 2023-05-03 NOTE — Telephone Encounter (Signed)
Copied from CRM [id number]. Topic: Clinical - Medication Refill >> May 03, 2023 11:28 AM Dimitri Ped wrote: Most Recent Primary Care Visit:  Provider: [facility]  Department[facility]  Visit Type: OFFICE VISIT  Date: 03/07/2023  Medication: ***  Has the patient contacted their pharmacy?  (Agent: If no, request that the patient contact the pharmacy for the refill. If patient does not wish to contact the pharmacy document the reason why and proceed with request.) (Agent: If yes, when and what did the pharmacy advise?)  Is this the correct pharmacy for this prescription?  If no, delete pharmacy and type the correct one.  This is the patient's preferred pharmacy:  [facility] [address], [place] - [address] [place] [address] Phone: [phone] Fax: [phone]   Has the prescription been filled recently?   Is the patient out of the medication?   Has the patient been seen for an appointment in the last year OR does the patient have an upcoming appointment?   Can we respond through MyChart?   Agent: Please be advised that Rx refills may take up to 3 business days. We ask that you follow-up with your pharmacy.

## 2023-05-23 ENCOUNTER — Other Ambulatory Visit: Payer: Self-pay

## 2023-05-23 DIAGNOSIS — G40909 Epilepsy, unspecified, not intractable, without status epilepticus: Secondary | ICD-10-CM

## 2023-05-25 ENCOUNTER — Other Ambulatory Visit: Payer: Self-pay

## 2023-05-25 ENCOUNTER — Other Ambulatory Visit: Payer: Self-pay | Admitting: Oncology

## 2023-05-25 DIAGNOSIS — G40909 Epilepsy, unspecified, not intractable, without status epilepticus: Secondary | ICD-10-CM

## 2023-05-31 ENCOUNTER — Inpatient Hospital Stay: Payer: No Typology Code available for payment source | Attending: Oncology | Admitting: Oncology

## 2023-05-31 ENCOUNTER — Inpatient Hospital Stay: Payer: No Typology Code available for payment source

## 2023-05-31 VITALS — BP 126/71 | HR 104 | Resp 16 | Ht 63.0 in | Wt 200.7 lb

## 2023-05-31 DIAGNOSIS — D649 Anemia, unspecified: Secondary | ICD-10-CM | POA: Insufficient documentation

## 2023-05-31 DIAGNOSIS — D500 Iron deficiency anemia secondary to blood loss (chronic): Secondary | ICD-10-CM | POA: Diagnosis not present

## 2023-05-31 DIAGNOSIS — Z801 Family history of malignant neoplasm of trachea, bronchus and lung: Secondary | ICD-10-CM | POA: Insufficient documentation

## 2023-05-31 DIAGNOSIS — Z8673 Personal history of transient ischemic attack (TIA), and cerebral infarction without residual deficits: Secondary | ICD-10-CM | POA: Diagnosis not present

## 2023-05-31 DIAGNOSIS — D6861 Antiphospholipid syndrome: Secondary | ICD-10-CM | POA: Diagnosis not present

## 2023-05-31 DIAGNOSIS — Z79899 Other long term (current) drug therapy: Secondary | ICD-10-CM | POA: Diagnosis not present

## 2023-05-31 DIAGNOSIS — D539 Nutritional anemia, unspecified: Secondary | ICD-10-CM

## 2023-05-31 DIAGNOSIS — Z7901 Long term (current) use of anticoagulants: Secondary | ICD-10-CM

## 2023-05-31 DIAGNOSIS — K7460 Unspecified cirrhosis of liver: Secondary | ICD-10-CM | POA: Insufficient documentation

## 2023-05-31 LAB — CBC WITH DIFFERENTIAL/PLATELET
Abs Immature Granulocytes: 0.03 10*3/uL (ref 0.00–0.07)
Basophils Absolute: 0.1 10*3/uL (ref 0.0–0.1)
Basophils Relative: 1 %
Eosinophils Absolute: 0.1 10*3/uL (ref 0.0–0.5)
Eosinophils Relative: 1 %
HCT: 30.3 % — ABNORMAL LOW (ref 36.0–46.0)
Hemoglobin: 9.1 g/dL — ABNORMAL LOW (ref 12.0–15.0)
Immature Granulocytes: 1 %
Lymphocytes Relative: 32 %
Lymphs Abs: 1.9 10*3/uL (ref 0.7–4.0)
MCH: 23.5 pg — ABNORMAL LOW (ref 26.0–34.0)
MCHC: 30 g/dL (ref 30.0–36.0)
MCV: 78.1 fL — ABNORMAL LOW (ref 80.0–100.0)
Monocytes Absolute: 0.4 10*3/uL (ref 0.1–1.0)
Monocytes Relative: 7 %
Neutro Abs: 3.6 10*3/uL (ref 1.7–7.7)
Neutrophils Relative %: 58 %
Platelets: 324 10*3/uL (ref 150–400)
RBC: 3.88 MIL/uL (ref 3.87–5.11)
RDW: 25.4 % — ABNORMAL HIGH (ref 11.5–15.5)
WBC: 6.1 10*3/uL (ref 4.0–10.5)
nRBC: 0 % (ref 0.0–0.2)
nRBC: 0 /100{WBCs}

## 2023-05-31 LAB — COMPREHENSIVE METABOLIC PANEL
ALT: 135 U/L — ABNORMAL HIGH (ref 0–44)
AST: 85 U/L — ABNORMAL HIGH (ref 15–41)
Albumin: 3.3 g/dL — ABNORMAL LOW (ref 3.5–5.0)
Alkaline Phosphatase: 178 U/L — ABNORMAL HIGH (ref 38–126)
Anion gap: 11 (ref 5–15)
BUN: 14 mg/dL (ref 6–20)
CO2: 23 mmol/L (ref 22–32)
Calcium: 8.8 mg/dL — ABNORMAL LOW (ref 8.9–10.3)
Chloride: 105 mmol/L (ref 98–111)
Creatinine, Ser: 0.78 mg/dL (ref 0.44–1.00)
GFR, Estimated: >60 mL/min (ref >60)
Glucose, Bld: 99 mg/dL (ref 70–99)
Potassium: 3.9 mmol/L (ref 3.5–5.1)
Sodium: 139 mmol/L (ref 135–145)
Total Bilirubin: 0.5 mg/dL (ref 0.0–1.2)
Total Protein: 6.9 g/dL (ref 6.5–8.1)

## 2023-05-31 LAB — PROTIME-INR
INR: 1.9 — ABNORMAL HIGH (ref 0.8–1.2)
Prothrombin Time: 22.1 s — ABNORMAL HIGH (ref 11.4–15.2)

## 2023-05-31 LAB — FERRITIN: Ferritin: 23 ng/mL (ref 11–307)

## 2023-05-31 NOTE — Progress Notes (Signed)
 [facility] Cancer Follow up Visit:  Patient Care Team: Sherre Clapper, MD as PCP - General (Family Medicine) Bernie Guillermina BROCKS, MD as Consulting Physician (Internal Medicine)  CHIEF COMPLAINTS/PURPOSE OF CONSULTATION:  HISTORY OF PRESENTING ILLNESS: Shannon Erickson 48 y.o. 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 Migraines  December 03 2016: Presented to [facility] health emergency department with right sided headache.   CT head without contrast negative for hemorrhage MRI head without contrast negative for hemorrhage but suspected right MCA M1 stenosis or occlusion CT angiogram showed proximal right M1 occlusion.  Evolution of right temporal lobe, insular cortex and right basal ganglia nonhemorrhagic infarction.  Focal irregularity right carotid bifurcation but no significant stenosis.  Fetal type posterior cerebral artery bilaterally TEE negative for PFO or ASO or endocarditis. Lower extremity Dopplers negative for DVT  WBC 7.4 hemoglobin 13.3 platelet count 259 high: 45 segs 42 lymphs 10 monos 2 eos 1 basophil INR 0.94 PTT 29 AT3 113 protein C activity 191 protein S activity 91 Lupus anticoagulant negative Beta-2  glycoprotein IgG 109.  anticardiolipin notable for IgM 13 (borderline) Factor V Leiden gene mutation study negative prothrombin gene mutation study negative ANA and Antidouble-stranded DNA negative RPR and HIV negative Hemoglobin A1c 6.2 Lipid panel notable for LDL of 138 (borderline) Alpha galactosidase 13.2 (low) (Raised the quesiton of Fabry disease) CMP notable for glucose of 138 albumin 2.8  Stroke was thought to be embolic in nature patient was placed on aspirin  325 mg daily Plavix  and a statin  March 22 2020: Right upper quadrant abdominal U/S showed a diffusely echogenic hepatic  parenchyma  August 14, 2021: Diffuse increased liver echogenicity most commonly seen in setting of fatty infiltration.  Superimposed inflammation and fibrosis not excluded  July 27 2022:  [facility] Hematology Consult  Patient states that her son age 71 was diagnosed with an antiphospholipid Ab at age 10 when he presented with PE.  None of her other children have an antiphospholipid antibody.  She was not on OCP's when the CVA occurred and was physically active.   She has been maintained on Coumadin  since 2018 and was recently changed to Eliquis .  She has never been on ASA or plavix .  Coumadin  dose was 6 to 7 mg daily with INR's ranging from 1.1 to 3.8.  No bleeding problems with coumadin .  Has been on Lipitor  since 2018.  Farxiga  was started 4 months ago.  Transaminases became elevated but have not been rechecked  Social history:  Disabled.  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  Tobacco none.  EtOH none.    August 01, 2022: WBC 6.8 hemoglobin 10.6 MCV 73 platelet count 330; 53 segs 35 lymphs 9 monos 2 eos 1 basophil Anti-cardiolipan antibody negative beta-2  glycoprotein antibody IgG 127 IgA 24 IgM undetectable INR 2.2 PTT 54   CMP notable for creatinine 0.77 albumin 2.8 AST 59 ALT 73  August 10 2022:  Scheduled follow up.  Reviewed results of lab tests with patient.  She states that the elevation in transaminases noted in November 2023 were attributed to Rybelsis which was stopped.   At that time coumadin  dose requirement decreased.  Currently without bleeding issues.  On Coumadin  6 mg daily.   Tissue transglutaminase antibody negative Alpha-1 antitrypsin 198 ANA panel negative LK M1 antibody negative.  Chromatin antibody negative Hepatitis  ABC serologies negative.  CMV PCR negative.  EBV PCR negative.  Ceruloplasmin 34.5. UA showed greater than 500 glucose.  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  Reviewed results of labs with patient.  Currently on Coumadin  6 mg daily.  No bleeding issues.  Patient was begun on Farxiga  last month.  May account for why she was subtherapeutic.    September 14 2022 through Sep 30 2022:  Received Venofer  total of 1000 mg  November 07, 2022 INR 3.2  December 02 2022: WBC 7.3 hemoglobin 14.8 count 268 normal differential. CMP notable for alk phos 186 albumin 3.4 AST 71 ALT 110  December 07, 2022: Remains on Coumadin  6 mg daily without bleeding issues.  Reviewed results of labs with patient.  INR 3.5  March 01 2023:    Remains on Coumadin  6 mg daily without bleeding issues.   Hemoglobin 9.9 MCV 76.  CMP notable for glucose 131 alk phos 143 albumin 2.5 AST 54 ALT 74.  Ferritin 4 INR 2.7  March 07 2023:  Follow up with PCP  November 11 through April 27 2023:  Received Venofer  a total of 1000 mg IV  May 31 2023:  Scheduled follow-up for management of antiphospholipid antibody syndrome.   Taking coumadin  7.5 mg daily.  Reports no o bleeding issues Hgb 9.1  INR 1.9   Review of Systems  Constitutional:  Negative for appetite change, chills, fatigue, fever and unexpected weight change.  HENT:   Negative for hearing loss, lump/mass, mouth sores, nosebleeds, sore throat, tinnitus, trouble swallowing and voice change.   Eyes:  Negative for eye problems and icterus.       Vision changes:  None  Respiratory:  Negative for chest tightness, cough, hemoptysis, shortness of breath and wheezing.        PND:  none Orthopnea:  none DOE:    Cardiovascular:  Negative for chest pain, leg swelling and palpitations.       PND:  none Orthopnea:  none  Gastrointestinal:  Negative for abdominal pain, blood in stool, constipation, diarrhea, nausea, rectal pain and vomiting.  Endocrine: Negative for hot flashes.       Cold intolerance:  none Heat intolerance:  none  Genitourinary:  Negative for bladder incontinence, difficulty urinating, dysuria,  frequency, hematuria and nocturia.   Musculoskeletal:  Positive for gait problem. Negative for arthralgias, back pain, myalgias, neck pain and neck stiffness.  Skin:  Negative for itching, rash and wound.  Neurological:  Positive for extremity weakness and gait problem. Negative for dizziness, headaches, light-headedness, numbness, seizures and speech difficulty.  Hematological:  Negative for adenopathy. Does not bruise/bleed easily.  Psychiatric/Behavioral:  Negative for sleep disturbance and suicidal ideas. The patient is not nervous/anxious.     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) 12/10/2016    SURGICAL HISTORY: Past Surgical History:  Procedure Laterality Date   CESAREAN SECTION Bilateral 1995, 2002   CHOLECYSTECTOMY     LYMPH NODE BIOPSY     MOUTH SURGERY Left 07/04/2021   TEE WITHOUT CARDIOVERSION N/A 12/05/2016   Procedure: TRANSESOPHAGEAL ECHOCARDIOGRAM (TEE);  Surgeon: Alveta Aleene PARAS, MD;  Location:  [facility] ENDOSCOPY;  Service: Cardiovascular;  Laterality: N/A;    SOCIAL HISTORY: Social History   Socioeconomic History   Marital status: Single    Spouse name: Not on file   Number of children: 3   Years of education: 12   Highest education level: GED or equivalent  Occupational History   Not on file  Tobacco Use   Smoking status: Never   Smokeless tobacco: Never  Vaping Use   Vaping status: Never Used  Substance and Sexual Activity   Alcohol use: No   Drug use: No   Sexual activity: Not Currently    Birth control/protection: Other-see comments  Other Topics Concern   Not on file  Social History Narrative   Not on file   Social Drivers of Health   Financial Resource Strain: Medium Risk (03/06/2023)   Overall Financial Resource Strain (CARDIA)    Difficulty of Paying Living Expenses:  Somewhat hard  Food Insecurity: Food Insecurity Present (03/06/2023)   Hunger Vital Sign    Worried About Running Out of Food in the Last Year: Sometimes true    Ran Out of Food in the Last Year: Sometimes true  Transportation Needs: No Transportation Needs (03/06/2023)   PRAPARE - Administrator, Civil Service (Medical): No    Lack of Transportation (Non-Medical): No  Physical Activity: Insufficiently Active (03/06/2023)   Exercise Vital Sign    Days of Exercise per Week: 5 days    Minutes of Exercise per Session: 10 min  Stress: No Stress Concern Present (03/06/2023)   [employer] of Occupational Health - Occupational Stress Questionnaire    Feeling of Stress : Only a little  Social Connections: Moderately Integrated (03/06/2023)   Social Connection and Isolation Panel [NHANES]    Frequency of Communication with Friends and Family: More than three times a week    Frequency of Social Gatherings with Friends and Family: Three times a week    Attends Religious Services: More than 4 times per year    Active Member of Clubs or Organizations: Yes    Attends Banker Meetings: 1 to 4 times per year    Marital Status: Divorced  Recent Concern: Social Connections - Moderately Isolated (01/04/2023)   Social Connection and Isolation Panel [NHANES]    Frequency of Communication with Friends and Family: More than three times a week    Frequency of Social Gatherings with Friends and Family: More than three times a week    Attends Religious Services: More than 4 times per year    Active Member of [employer] or Organizations: No    Attends Banker Meetings: Never    Marital Status: Never married  Intimate Partner Violence: Not At Risk (01/04/2023)   Humiliation, Afraid, Rape, and Kick questionnaire    Fear of Current or Ex-Partner: No    Emotionally Abused: No    Physically Abused: No    Sexually Abused: No    FAMILY HISTORY Family History  Problem  Relation Age of Onset   Lung cancer Mother        mets to brain, bone, liver   Hypertension Father    Heart attack Brother        at age 71   Renal cancer Maternal Grandfather    Heart attack Paternal Grandfather     ALLERGIES:  is allergic to penicillins.  MEDICATIONS:  Current Outpatient Medications  Medication Sig Dispense Refill   atorvastatin  (LIPITOR ) 80 MG tablet Take 1 tablet (  80 mg total) by mouth daily. 90 tablet 1   dapagliflozin  propanediol (FARXIGA ) 10 MG TABS tablet Take 1 tablet (10 mg total) by mouth daily before breakfast. 90 tablet 0   escitalopram  (LEXAPRO ) 10 MG tablet Take 1 tablet (10 mg total) by mouth daily. 30 tablet 3   levETIRAcetam  (KEPPRA ) 750 MG tablet Take 1 tablet by mouth twice daily 60 tablet 5   nitroGLYCERIN  (NITROSTAT ) 0.4 MG SL tablet Place 1 tablet under the tongue every 5 (five) minutes as needed.  0   warfarin (COUMADIN ) 5 MG tablet TAKE 1 & 1/2 (ONE & ONE-HALF) TABLETS BY MOUTH ONCE DAILY 45 tablet 3   No current facility-administered medications for this visit.    PHYSICAL EXAMINATION:  ECOG PERFORMANCE STATUS: 1 - Symptomatic but completely ambulatory   Vitals:   05/31/23 1504  BP: 126/71  Pulse: (!) 104  Resp: 16  SpO2: 98%      Filed Weights   05/31/23 1504  Weight: 200 lb 11.2 oz (91 kg)       Physical Exam Vitals and nursing note reviewed.  Constitutional:      Appearance: Normal appearance. She is not toxic-appearing or diaphoretic.     Comments: Here alone  HENT:     Head: Normocephalic and atraumatic.     Right Ear: External ear normal.     Left Ear: External ear normal.     Nose: Nose normal. No congestion or rhinorrhea.  Eyes:     General: No scleral icterus.    Extraocular Movements: Extraocular movements intact.     Conjunctiva/sclera: Conjunctivae normal.     Pupils: Pupils are equal, round, and reactive to light.  Cardiovascular:     Rate and Rhythm: Normal rate.     Heart sounds: No murmur  heard.    No friction rub. No gallop.  Abdominal:     General: Bowel sounds are normal.     Palpations: Abdomen is soft.  Musculoskeletal:        General: No swelling, tenderness or deformity.     Cervical back: Normal range of motion and neck supple. No rigidity or tenderness.  Lymphadenopathy:     Head:     Right side of head: No submental, submandibular, tonsillar, preauricular, posterior auricular or occipital adenopathy.     Left side of head: No submental, submandibular, tonsillar, preauricular, posterior auricular or occipital adenopathy.     Cervical: No cervical adenopathy.     Right cervical: No superficial, deep or posterior cervical adenopathy.    Left cervical: No superficial, deep or posterior cervical adenopathy.     Upper Body:     Right upper body: No supraclavicular, axillary, pectoral or epitrochlear adenopathy.     Left upper body: No supraclavicular, axillary, pectoral or epitrochlear adenopathy.  Skin:    General: Skin is warm.     Coloration: Skin is not jaundiced.  Neurological:     General: No focal deficit present.     Mental Status: She is alert and oriented to person, place, and time. Mental status is at baseline.     Cranial Nerves: No cranial nerve deficit.  Psychiatric:        Mood and Affect: Mood normal.        Behavior: Behavior normal.        Thought Content: Thought content normal.        Judgment: Judgment normal.      LABORATORY DATA: I have personally reviewed the data as listed:  No visits with results within 1 Month(s) from this visit.  Latest known visit with results is:  Office Visit on 03/07/2023  Component Date Value Ref Range Status   Hgb A1c MFr Bld 03/07/2023 6.4 (H)  4.8 - 5.6 % Final   Comment:          Prediabetes: 5.7 - 6.4          Diabetes: >6.4          Glycemic control for adults with diabetes: <7.0    Est. average glucose Bld gHb Est-m* 03/07/2023 137  mg/dL Final   Cholesterol, Total 03/07/2023 137  100 - 199  mg/dL Final   Triglycerides 89/84/7975 102  0 - 149 mg/dL Final   HDL 89/84/7975 46  >39 mg/dL Final   VLDL Cholesterol Cal 03/07/2023 19  5 - 40 mg/dL Final   LDL Chol Calc (NIH) 03/07/2023 72  0 - 99 mg/dL Final   Chol/HDL Ratio 03/07/2023 3.0  0.0 - 4.4 ratio Final   Comment:                                   T. Chol/HDL Ratio                                             Men  Women                               1/2 Avg.Risk  3.4    3.3                                   Avg.Risk  5.0    4.4                                2X Avg.Risk  9.6    7.1                                3X Avg.Risk 23.4   11.0     RADIOGRAPHIC STUDIES: I have personally reviewed the radiological images as listed and agree with the findings in the report  No results found.  ASSESSMENT/PLAN   48 y.o. female is here because of antiphospholipid antibody syndrome.  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  Imaging with CT angio and MRI demonstrated M1 occlusion with resultant nonhemorrhagic infarction of right temporal lobe, insular cortex basal ganglia  Cardiac imaging to locate an embolic source was negative.  Biochemical evaluation notable for Beta-2  glycoprotein IgG 109.   Antiphospholipid antibody syndrome: This is defined by the presence of antiphospholipid antibodies in the setting of thrombosis.  Most common sites of venous and arterial thrombosis in the lower limbs and cerebral circulation.  Patient appears to meet diagnostic criteria based on the presence of the beta-2  glycoprotein IgG which is strongly positive in the history of CVA. July 27 2022- Standard of care in terms of management is the use of warfarin with a goal INR of 2.0-3.0 long-term addition of low-dose aspirin  is also a  reasonable we will therefore transition patient back to Coumadin  with flat dosing.  Discussed Coumadin  management with patient    August 01 2022- Anti-cardiolipan antibody negative beta-2  glycoprotein antibody IgG 127 IgA 24 IgM undetectable INR 2.2 PTT 54 .  Patient on Coumadin  6 mg daily  August 10 2022- Patient revealed that in November 2023 she was taking Rybelsus ; this likely accounted for the large elevation in transaminases and difficulty in managing Coumadin  dosing.  September 07 2022- INR 1.1.  Will increase coumdin dose to 9 mg daily.   December 07 2022- INR 3.5 on Coumadin  6 mg daily.  Will instruct patient to decrease Coumadin  to 5 mg daily March 01 2023- INR 2.7 on Coumadin  6 mg daily.  Fluctuations in Coumadin  dosing may be related to hepatic issues May 31 2023- INR 1.9 on Coumadin  7.5 mg daily  Anemia  September 07 2022- Likely secondary to chronic blood loss exacerbated by anticoagulation   September 14 2022 through Sep 30 2022: Received Venofer  total of 1000 mg   December 02 2022- Hgb 14.8  March 01 2023- Hgb 9.9 MCV 76 Ferritin 4.  Arrange for IV iron .  Arrange for follow up visit to investigate cause of IDA  November 11 through April 27 2023: Received Venofer  a total of 1000 mg IV  May 31 2023- Hgb 9.1.  May need additional IV iron  on return in 3 months  Cirrhosis  March 22 2020: Right upper quadrant abdominal U/S showed a diffusely echogenic hepatic parenchyma   August 14, 2021: Diffuse increased liver echogenicity most commonly seen in setting of fatty infiltration.  Superimposed inflammation and fibrosis not excluded  March 01 2023- Likely due to DM Type II.     Cancer Staging  No matching staging information was found for the patient.    No problem-specific Assessment & Plan notes found for this encounter.    No orders of the defined types were placed in this encounter.   20  minutes was spent in patient care.  This included time spent preparing to see the patient  (e.g., review of tests), obtaining and/or reviewing separately obtained history, counseling and educating the patient, ordering medications, tests, or procedures; documenting clinical information in the electronic or other health record, independently interpreting results and communicating results to the patient as well as coordination of care.       All questions were answered. The patient knows to call the clinic with any problems, questions or concerns.  This note was electronically signed.    Guillermina JAYSON Perla, MD  05/31/2023 3:10 PM

## 2023-06-09 ENCOUNTER — Encounter: Payer: Self-pay | Admitting: Oncology

## 2023-06-12 ENCOUNTER — Ambulatory Visit (INDEPENDENT_AMBULATORY_CARE_PROVIDER_SITE_OTHER): Payer: No Typology Code available for payment source | Admitting: Family Medicine

## 2023-06-12 ENCOUNTER — Encounter: Payer: Self-pay | Admitting: Family Medicine

## 2023-06-12 VITALS — BP 110/70 | HR 102 | Temp 96.4°F | Resp 14 | Ht 63.0 in | Wt 202.0 lb

## 2023-06-12 DIAGNOSIS — E1159 Type 2 diabetes mellitus with other circulatory complications: Secondary | ICD-10-CM | POA: Diagnosis not present

## 2023-06-12 DIAGNOSIS — Z6835 Body mass index (BMI) 35.0-35.9, adult: Secondary | ICD-10-CM | POA: Diagnosis not present

## 2023-06-12 DIAGNOSIS — I152 Hypertension secondary to endocrine disorders: Secondary | ICD-10-CM | POA: Diagnosis not present

## 2023-06-12 DIAGNOSIS — E66812 Obesity, class 2: Secondary | ICD-10-CM

## 2023-06-12 DIAGNOSIS — K7460 Unspecified cirrhosis of liver: Secondary | ICD-10-CM | POA: Diagnosis not present

## 2023-06-12 DIAGNOSIS — G40909 Epilepsy, unspecified, not intractable, without status epilepticus: Secondary | ICD-10-CM | POA: Diagnosis not present

## 2023-06-12 DIAGNOSIS — D6861 Antiphospholipid syndrome: Secondary | ICD-10-CM | POA: Diagnosis not present

## 2023-06-12 DIAGNOSIS — I69354 Hemiplegia and hemiparesis following cerebral infarction affecting left non-dominant side: Secondary | ICD-10-CM | POA: Diagnosis not present

## 2023-06-12 DIAGNOSIS — E782 Mixed hyperlipidemia: Secondary | ICD-10-CM

## 2023-06-12 LAB — HM DIABETES EYE EXAM

## 2023-06-12 NOTE — Addendum Note (Signed)
Addended by: Tawny Asal I on: 06/12/2023 04:54 PM   Modules accepted: Orders

## 2023-06-12 NOTE — Assessment & Plan Note (Addendum)
Control: good Recommend check feet daily. Recommend annual eye exams. Medicines: farxiga. No medicines needed for hypertension. Continue to work on eating a healthy diet and exercise.  Labs drawn today.

## 2023-06-12 NOTE — Assessment & Plan Note (Signed)
Management per specialist.  The current medical regimen is effective;  continue present plan and medications. Continue keppra

## 2023-06-12 NOTE — Progress Notes (Signed)
Subjective:  Patient ID: Shannon Erickson, female    DOB: 12/09/1975  Age: 48 y.o. MRN: [id number]  Chief Complaint  Patient presents with   Medical Management of Chronic Issues    HPI Hyperlipidemia: On Atorvastatin 80 mg daily without problems.    Hypertension: no antihypertensives. Patient is working on maintaining diet and exercise regimen and follows up as directed.   DMII: taking farxiga 10 mg daily, checks feet daily, does not check FBS. Last eye exam: 06/12/2023. She has a normal retinal screening A1C 6.4.   Seizure disorder: She is taking Keppra 750 mg twice a day. No seizures.  Sees [facility].  Dr.Sethi.   Antiphospholipid syndrome: on warfarin 6 mg daily. Patient was referred to hematology for management. Patient is seeing Dr. Constance Goltz, but he is leaving. Dr. Melvyn Neth will be taking over.    Depression/Anxiety: on lexapro 10 mg daily.   The patient, with a history of diabetes, seizures, and anticoagulation therapy, presents for a routine follow-up. She reports no changes in her medications and denies any new symptoms. She is currently on atorvastatin, Keppra, warfarin, and Lexapro. She is also receiving iron infusions. The patient's diabetes has been stable with a recent HbA1c of 6.4. She is managed by Dr. Pearlean Brownie for seizures and Dr. Constance Goltz for anticoagulation therapy. However, she reports that he is leaving and she will be seeing Dr. Melvyn Neth for her anticoagulation management. The patient says her last Pap smear was 20+ years ago and has declined vaccinations.     03/07/2023    2:00 PM 01/04/2023    3:14 PM 12/02/2022   10:42 AM 08/03/2022   10:35 AM 12/29/2021   10:15 AM  Depression screen PHQ 2/9  Decreased Interest 0 1 1 0 0  Down, Depressed, Hopeless 0 0 0 0 0  PHQ - 2 Score 0 1 1 0 0  Altered sleeping 1 1 1     Tired, decreased energy 1 1 1     Change in appetite 0 0 0    Feeling bad or failure about yourself  0 0 0    Trouble concentrating 0 1 1    Moving  slowly or fidgety/restless 0 0 0    Suicidal thoughts 0 0 0    PHQ-9 Score 2 4 4     Difficult doing work/chores Not difficult at all Somewhat difficult Somewhat difficult          03/07/2023    2:01 PM  Fall Risk   Falls in the past year? 1  Number falls in past yr: 0  Injury with Fall? 0  Risk for fall due to : Impaired balance/gait;Impaired mobility  Follow up Falls evaluation completed    Patient Care Team: Blane Ohara, MD as PCP - General (Family Medicine) Loni Muse, MD as Consulting Physician (Internal Medicine)   Review of Systems  Constitutional:  Negative for chills, fatigue and fever.  HENT:  Negative for congestion, ear pain and sore throat.   Respiratory:  Negative for cough and shortness of breath.   Cardiovascular:  Negative for chest pain and palpitations.  Gastrointestinal:  Negative for abdominal pain, constipation, diarrhea, nausea and vomiting.  Endocrine: Negative for polydipsia, polyphagia and polyuria.  Genitourinary:  Negative for difficulty urinating and dysuria.  Musculoskeletal:  Negative for arthralgias, back pain and myalgias.  Skin:  Negative for rash.  Neurological:  Negative for headaches.  Psychiatric/Behavioral:  Negative for dysphoric mood. The patient is not nervous/anxious.     Current Outpatient  Medications on File Prior to Visit  Medication Sig Dispense Refill   atorvastatin (LIPITOR) 80 MG tablet Take 1 tablet (80 mg total) by mouth daily. 90 tablet 1   dapagliflozin propanediol (FARXIGA) 10 MG TABS tablet Take 1 tablet (10 mg total) by mouth daily before breakfast. 90 tablet 0   escitalopram (LEXAPRO) 10 MG tablet Take 1 tablet (10 mg total) by mouth daily. 30 tablet 3   levETIRAcetam (KEPPRA) 750 MG tablet Take 1 tablet by mouth twice daily 60 tablet 5   nitroGLYCERIN (NITROSTAT) 0.4 MG SL tablet Place 1 tablet under the tongue every 5 (five) minutes as needed.  0   warfarin (COUMADIN) 5 MG tablet TAKE 1 & 1/2 (ONE &  ONE-HALF) TABLETS BY MOUTH ONCE DAILY 45 tablet 3   No current facility-administered medications on file prior to visit.   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   Past Surgical History:  Procedure Laterality Date   CESAREAN SECTION Bilateral 1995, 2002   CHOLECYSTECTOMY     LYMPH NODE BIOPSY     MOUTH SURGERY Left 07/04/2021   TEE WITHOUT CARDIOVERSION N/A 12/05/2016   Procedure: TRANSESOPHAGEAL ECHOCARDIOGRAM (TEE);  Surgeon: Elease Hashimoto, Deloris Ping, MD;  Location: [facility] ENDOSCOPY;  Service: Cardiovascular;  Laterality: N/A;    Family History  Problem Relation Age of Onset   Lung cancer Mother        mets to brain, bone, liver   Hypertension Father    Heart attack Brother        at age 95   Renal cancer Maternal Grandfather    Heart attack Paternal Grandfather    Social History   Socioeconomic History   Marital status: Single    Spouse name: Not on file   Number of children: 3   Years of education: 12   Highest education level: GED or equivalent  Occupational History   Not on file  Tobacco Use   Smoking status: Never   Smokeless tobacco: Never  Vaping Use   Vaping status: Never Used  Substance and Sexual Activity   Alcohol use: No   Drug use: No   Sexual activity: Yes    Partners: Male    Birth control/protection: Surgical  Other Topics Concern   Not on file  Social History Narrative   Not on file   Social Drivers of Health   Financial Resource Strain: Medium Risk (03/06/2023)   Overall Financial Resource Strain (CARDIA)    Difficulty of Paying Living Expenses: Somewhat hard  Food Insecurity: Food Insecurity Present (03/06/2023)   Hunger Vital Sign    Worried About Running Out of Food in the Last Year: Sometimes true    Ran Out of Food in the Last Year: Sometimes true  Transportation Needs: No  Transportation Needs (03/06/2023)   PRAPARE - Administrator, Civil Service (Medical): No    Lack of Transportation (Non-Medical): No  Physical Activity: Insufficiently Active (03/06/2023)   Exercise Vital Sign    Days of Exercise per Week: 5 days    Minutes of Exercise per Session: 10 min  Stress: No Stress Concern Present (03/06/2023)   [employer] of Occupational Health - Occupational Stress Questionnaire    Feeling of Stress : Only a little  Social Connections: Moderately  Integrated (03/06/2023)   Social Connection and Isolation Panel [NHANES]    Frequency of Communication with Friends and Family: More than three times a week    Frequency of Social Gatherings with Friends and Family: Three times a week    Attends Religious Services: More than 4 times per year    Active Member of Clubs or Organizations: Yes    Attends Banker Meetings: 1 to 4 times per year    Marital Status: Divorced  Recent Concern: Social Connections - Moderately Isolated (01/04/2023)   Social Connection and Isolation Panel [NHANES]    Frequency of Communication with Friends and Family: More than three times a week    Frequency of Social Gatherings with Friends and Family: More than three times a week    Attends Religious Services: More than 4 times per year    Active Member of [employer] or Organizations: No    Attends Engineer, structural: Never    Marital Status: Never married    Objective:  BP 110/70   Pulse (!) 102   Temp (!) 96.4 F (35.8 C)   Resp 14   Ht 5\' 3"  (1.6 m)   Wt 202 lb (91.6 kg)   LMP 05/30/2023 (Approximate)   SpO2 99%   BMI 35.78 kg/m      06/12/2023    2:16 PM 05/31/2023    3:04 PM 04/27/2023    2:12 PM  BP/Weight  Systolic BP 110 126 117  Diastolic BP 70 71 71  Wt. (Lbs) 202 200.7   BMI 35.78 kg/m2 35.55 kg/m2     Physical Exam Vitals reviewed.  Constitutional:      Appearance: Normal appearance. She is normal weight.  Neck:      Vascular: No carotid bruit.  Cardiovascular:     Rate and Rhythm: Normal rate and regular rhythm.     Heart sounds: Normal heart sounds.  Pulmonary:     Effort: Pulmonary effort is normal. No respiratory distress.     Breath sounds: Normal breath sounds.  Abdominal:     General: Abdomen is flat. Bowel sounds are normal.     Palpations: Abdomen is soft.     Tenderness: There is no abdominal tenderness.  Musculoskeletal:     Comments: Left sided weakness. Dropped left foot and contractured hand.   Neurological:     Mental Status: She is alert and oriented to person, place, and time.  Psychiatric:        Mood and Affect: Mood normal.        Behavior: Behavior normal.     Diabetic Foot Exam - Simple   No data filed      Lab Results  Component Value Date   WBC 6.1 05/31/2023   HGB 9.1 (L) 05/31/2023   HCT 30.3 (L) 05/31/2023   PLT 324 05/31/2023   GLUCOSE 99 05/31/2023   CHOL 137 03/07/2023   TRIG 102 03/07/2023   HDL 46 03/07/2023   LDLCALC 72 03/07/2023   ALT 135 (H) 05/31/2023   AST 85 (H) 05/31/2023   NA 139 05/31/2023   K 3.9 05/31/2023   CL 105 05/31/2023   CREATININE 0.78 05/31/2023   BUN 14 05/31/2023   CO2 23 05/31/2023   TSH 1.150 12/14/2021   INR 1.9 (H) 05/31/2023   HGBA1C 6.4 (H) 03/07/2023   MICROALBUR 30 03/22/2021      Assessment & Plan:    Mixed hyperlipidemia Assessment & Plan: Well controlled.  No changes to  medicines. Continue atorvastatin 80 mg one before bed.  Continue to work on eating a healthy diet and exercise.  Labs drawn today.    Orders: -     Lipid panel  Antiphospholipid syndrome (HCC) Assessment & Plan: Continue warfarin.  Management per specialist.     Hypertension associated with diabetes [facility]) Assessment & Plan: Control: good Recommend check feet daily. Recommend annual eye exams. Medicines: farxiga. No medicines needed for hypertension. Continue to work on eating a healthy diet and exercise.  Labs drawn  today.     Orders: -     CBC with Differential/Platelet -     Comprehensive metabolic panel -     Hemoglobin A1c -     Microalbumin / creatinine urine ratio  Seizure disorder [facility]) Assessment & Plan: Management per specialist.  The current medical regimen is effective;  continue present plan and medications. Continue keppra   Cirrhosis of liver without ascites, unspecified hepatic cirrhosis type [facility]) Assessment & Plan: Check LFT.   Hemiparesis affecting left side as late effect of cerebrovascular accident [facility]) Assessment & Plan: Stable. No changes to medicines.    Class 2 severe obesity due to excess calories with serious comorbidity and body mass index (BMI) of 35.0 to 35.9 in adult [facility]) Assessment & Plan: Recommend continue to work on eating healthy diet and exercise. Comorbidities: hyperlipidemia and diabetes.      General Health Maintenance -Declined flu and pneumonia vaccines. -Overdue for Pap smear, last performed in 2002. Recommended to schedule a physical with Pap smear in 3 months.  No orders of the defined types were placed in this encounter.   Orders Placed This Encounter  Procedures   CBC with Differential/Platelet   Comprehensive metabolic panel   Hemoglobin A1c   Lipid panel   Microalbumin / creatinine urine ratio     Follow-up: Return in about 3 months (around 09/10/2023) for chronic follow up, cpe with pap smear.Clayborn Bigness I Leal-Borjas,acting as a scribe for Blane Ohara, MD.,have documented all relevant documentation on the behalf of Blane Ohara, MD,as directed by  Blane Ohara, MD while in the presence of Blane Ohara, MD.   An After Visit Summary was printed and given to the patient.  I attest that I have reviewed this visit and agree with the plan scribed by my staff.   Blane Ohara, MD [facility] [phone]

## 2023-06-12 NOTE — Assessment & Plan Note (Signed)
Recommend continue to work on eating healthy diet and exercise. Comorbidities: hyperlipidemia and diabetes.

## 2023-06-12 NOTE — Assessment & Plan Note (Signed)
Well controlled.  No changes to medicines. Continue atorvastatin 80 mg one before bed.  Continue to work on eating a healthy diet and exercise.  Labs drawn today.

## 2023-06-12 NOTE — Assessment & Plan Note (Signed)
Stable. No changes to medicines.

## 2023-06-12 NOTE — Assessment & Plan Note (Signed)
Continue warfarin.  Management per specialist.

## 2023-06-12 NOTE — Assessment & Plan Note (Signed)
Check LFT ?

## 2023-06-13 LAB — MICROALBUMIN / CREATININE URINE RATIO
Creatinine, Urine: 92.9 mg/dL
Microalb/Creat Ratio: <3 mg/g{creat} (ref 0–29)
Microalbumin, Urine: <3 ug/mL

## 2023-06-14 ENCOUNTER — Encounter: Payer: Self-pay | Admitting: Family Medicine

## 2023-06-16 ENCOUNTER — Ambulatory Visit: Payer: No Typology Code available for payment source

## 2023-06-16 DIAGNOSIS — I152 Hypertension secondary to endocrine disorders: Secondary | ICD-10-CM | POA: Diagnosis not present

## 2023-06-16 DIAGNOSIS — E782 Mixed hyperlipidemia: Secondary | ICD-10-CM

## 2023-06-16 DIAGNOSIS — E1159 Type 2 diabetes mellitus with other circulatory complications: Secondary | ICD-10-CM | POA: Diagnosis not present

## 2023-06-17 LAB — COMPREHENSIVE METABOLIC PANEL
ALT: 178 [IU]/L — ABNORMAL HIGH (ref 0–32)
AST: 116 [IU]/L — ABNORMAL HIGH (ref 0–40)
Albumin: 3.1 g/dL — ABNORMAL LOW (ref 3.9–4.9)
Alkaline Phosphatase: 199 [IU]/L — ABNORMAL HIGH (ref 44–121)
BUN/Creatinine Ratio: 16 (ref 9–23)
BUN: 13 mg/dL (ref 6–24)
Bilirubin Total: 0.6 mg/dL (ref 0.0–1.2)
CO2: 20 mmol/L (ref 20–29)
Calcium: 7.9 mg/dL — ABNORMAL LOW (ref 8.7–10.2)
Chloride: 105 mmol/L (ref 96–106)
Creatinine, Ser: 0.82 mg/dL (ref 0.57–1.00)
Globulin, Total: 3.5 g/dL (ref 1.5–4.5)
Glucose: 93 mg/dL (ref 70–99)
Potassium: 3.9 mmol/L (ref 3.5–5.2)
Sodium: 138 mmol/L (ref 134–144)
Total Protein: 6.6 g/dL (ref 6.0–8.5)
eGFR: 89 mL/min/{1.73_m2} (ref >59)

## 2023-06-17 LAB — CBC WITH DIFFERENTIAL/PLATELET
Basophils Absolute: 0 10*3/uL (ref 0.0–0.2)
Basos: 1 %
EOS (ABSOLUTE): 0.1 10*3/uL (ref 0.0–0.4)
Eos: 1 %
Hematocrit: 31.6 % — ABNORMAL LOW (ref 34.0–46.6)
Hemoglobin: 9.2 g/dL — ABNORMAL LOW (ref 11.1–15.9)
Immature Grans (Abs): 0 10*3/uL (ref 0.0–0.1)
Immature Granulocytes: 0 %
Lymphocytes Absolute: 2.1 10*3/uL (ref 0.7–3.1)
Lymphs: 40 %
MCH: 21.6 pg — ABNORMAL LOW (ref 26.6–33.0)
MCHC: 29.1 g/dL — ABNORMAL LOW (ref 31.5–35.7)
MCV: 74 fL — ABNORMAL LOW (ref 79–97)
Monocytes Absolute: 0.4 10*3/uL (ref 0.1–0.9)
Monocytes: 7 %
Neutrophils Absolute: 2.7 10*3/uL (ref 1.4–7.0)
Neutrophils: 51 %
Platelets: 258 10*3/uL (ref 150–450)
RBC: 4.25 x10E6/uL (ref 3.77–5.28)
RDW: 22.6 % — ABNORMAL HIGH (ref 11.7–15.4)
WBC: 5.3 10*3/uL (ref 3.4–10.8)

## 2023-06-17 LAB — HEMOGLOBIN A1C
Est. average glucose Bld gHb Est-mCnc: 126 mg/dL
Hgb A1c MFr Bld: 6 % — ABNORMAL HIGH (ref 4.8–5.6)

## 2023-06-17 LAB — LIPID PANEL
Chol/HDL Ratio: 2.7 {ratio} (ref 0.0–4.4)
Cholesterol, Total: 135 mg/dL (ref 100–199)
HDL: 50 mg/dL (ref >39)
LDL Chol Calc (NIH): 65 mg/dL (ref 0–99)
Triglycerides: 107 mg/dL (ref 0–149)
VLDL Cholesterol Cal: 20 mg/dL (ref 5–40)

## 2023-06-18 ENCOUNTER — Encounter: Payer: Self-pay | Admitting: Family Medicine

## 2023-06-19 ENCOUNTER — Other Ambulatory Visit: Payer: Self-pay | Admitting: Family Medicine

## 2023-06-19 DIAGNOSIS — R7989 Other specified abnormal findings of blood chemistry: Secondary | ICD-10-CM

## 2023-08-28 ENCOUNTER — Other Ambulatory Visit: Payer: Self-pay | Admitting: Oncology

## 2023-08-28 DIAGNOSIS — D6861 Antiphospholipid syndrome: Secondary | ICD-10-CM

## 2023-08-28 NOTE — Progress Notes (Unsigned)
 [facility]  [address] [place],  [place]  [address] [phone]  Clinic Day:  08/29/2023  Referring physician: Blane Ohara, MD   HISTORY OF PRESENT ILLNESS:  The patient is a 48 y.o. female with antiphospholipid syndrome.  This was diagnosed after having an embolic stroke in 2018.  On 3 separate occasions, her IgG beta-2 glycoprotein antibody level was very elevated.  Based upon this, she is on chronic warfarin therapy for which she takes 7.5 mg daily.  The patient also has issues with iron deficiency anemia related to heavy menstrual cycles.  Over the past  month, the patient claims she has bled 3-4 straight weeks.  She is scheduled for her next Pap smear/pelvic exam within this calendar month to address these issues.  She denies having other overt forms of blood loss.  She also claims that her menstrual cycles were very heavy even before anticoagulation was started for her antiphospholipid syndrome.  PHYSICAL EXAM:  Blood pressure 124/67, pulse 96, temperature 98.5 F (36.9 C), temperature source Oral, resp. rate 16, height 5\' 3"  (1.6 m), weight 200 lb 1.6 oz (90.8 kg), SpO2 100%. Wt Readings from Last 3 Encounters:  08/29/23 200 lb 1.6 oz (90.8 kg)  06/12/23 202 lb (91.6 kg)  05/31/23 200 lb 11.2 oz (91 kg)   Body mass index is 35.45 kg/m. Performance status (ECOG): 1 - Symptomatic but completely ambulatory Physical Exam Constitutional:      Appearance: Normal appearance. She is not ill-appearing.  HENT:     Mouth/Throat:     Mouth: Mucous membranes are moist.     Pharynx: Oropharynx is clear. No oropharyngeal exudate or posterior oropharyngeal erythema.  Cardiovascular:     Rate and Rhythm: Normal rate and regular rhythm.     Heart sounds: No murmur heard.    No friction rub. No gallop.  Pulmonary:     Effort: Pulmonary effort is normal. No respiratory distress.     Breath sounds: Normal breath sounds. No wheezing, rhonchi or rales.   Abdominal:     General: Bowel sounds are normal. There is no distension.     Palpations: Abdomen is soft. There is no mass.     Tenderness: There is no abdominal tenderness.  Musculoskeletal:        General: No swelling.     Right lower leg: No edema.     Left lower leg: No edema.  Lymphadenopathy:     Cervical: No cervical adenopathy.     Upper Body:     Right upper body: No supraclavicular or axillary adenopathy.     Left upper body: No supraclavicular or axillary adenopathy.     Lower Body: No right inguinal adenopathy. No left inguinal adenopathy.  Skin:    General: Skin is warm.     Coloration: Skin is not jaundiced.     Findings: No lesion or rash.  Neurological:     General: No focal deficit present.     Mental Status: She is alert and oriented to person, place, and time. Mental status is at baseline.     Motor: Weakness (Left arm) present.  Psychiatric:        Mood and Affect: Mood normal.        Behavior: Behavior normal.        Thought Content: Thought content normal.    LABS:      Latest Ref Rng & Units 08/29/2023    1:06 PM 06/16/2023   11:05 AM  05/31/2023    3:36 PM  CBC  WBC 4.0 - 10.5 K/uL 4.7  5.3  6.1   Hemoglobin 12.0 - 15.0 g/dL 6.7  9.2  9.1   Hematocrit 36.0 - 46.0 % 25.7  31.6  30.3   Platelets 150 - 400 K/uL PLATELET CLUMPS NOTED ON SMEAR, UNABLE TO ESTIMATE  258  324       Latest Ref Rng & Units 08/29/2023    1:06 PM 06/16/2023   11:05 AM 05/31/2023    3:36 PM  CMP  Glucose 70 - 99 mg/dL 161  93  99   BUN 6 - 20 mg/dL 9  13  14    Creatinine 0.44 - 1.00 mg/dL 0.96  0.45  4.09   Sodium 135 - 145 mmol/L 138  138  139   Potassium 3.5 - 5.1 mmol/L 3.6  3.9  3.9   Chloride 98 - 111 mmol/L 103  105  105   CO2 22 - 32 mmol/L 22  20  23    Calcium 8.9 - 10.3 mg/dL 8.2  7.9  8.8   Total Protein 6.5 - 8.1 g/dL 6.1  6.6  6.9   Total Bilirubin 0.0 - 1.2 mg/dL 0.5  0.6  0.5   Alkaline Phos 38 - 126 U/L 182  199  178   AST 15 - 41 U/L 80  116  85   ALT 0 -  44 U/L 103  178  135     Latest Reference Range & Units 12/04/16 13:16 02/22/17 12:42 08/01/22 15:43  Beta-2 Glycoprotein I Ab, IgG 0 - 20 GPI IgG units 109 (H) >150 (H) 127 (H)  (H): Data is abnormally high  Iron studies pending PT/INR pending  ASSESSMENT & PLAN:   Assessment/Plan:  A 48 y.o. female with antiphospholipid syndrome, which was discovered per a workup for an embolic stroke in 2018.  As mentioned previously, this patient is compliant with her warfarin, which she takes to 7.5 mg daily.  Her INR today of   The patient also has iron deficiency anemia.  I am very concerned with her low hemoglobin of 6.7 today.  I did offer the patient to undergo a blood transfusion, for which she declined.  Nevertheless, I will arrange for her to receive IV iron immediately to bolster her iron stores and hopefully improve her hemoglobin over these next weeks.  She knows to keep her Pap smear/pelvic exam visit before primary care provider to ensure no adverse gynecologic pathology is present.  Otherwise, I will see this patient back in 3 months to primarily reassess her iron deficiency anemia.  The patient understands all the plans discussed today and is in agreement with them.    Quynh Basso Kirby Funk, MD

## 2023-08-29 ENCOUNTER — Telehealth: Payer: Self-pay | Admitting: Oncology

## 2023-08-29 ENCOUNTER — Other Ambulatory Visit: Payer: Self-pay | Admitting: Oncology

## 2023-08-29 ENCOUNTER — Other Ambulatory Visit: Payer: Self-pay

## 2023-08-29 ENCOUNTER — Inpatient Hospital Stay: Payer: No Typology Code available for payment source | Attending: Oncology | Admitting: Oncology

## 2023-08-29 ENCOUNTER — Telehealth: Payer: Self-pay | Admitting: Hematology and Oncology

## 2023-08-29 ENCOUNTER — Inpatient Hospital Stay: Payer: No Typology Code available for payment source

## 2023-08-29 ENCOUNTER — Telehealth: Payer: Self-pay

## 2023-08-29 VITALS — BP 124/67 | HR 96 | Temp 98.5°F | Resp 16 | Ht 63.0 in | Wt 200.1 lb

## 2023-08-29 DIAGNOSIS — Z7901 Long term (current) use of anticoagulants: Secondary | ICD-10-CM | POA: Insufficient documentation

## 2023-08-29 DIAGNOSIS — D6861 Antiphospholipid syndrome: Secondary | ICD-10-CM | POA: Diagnosis not present

## 2023-08-29 DIAGNOSIS — D500 Iron deficiency anemia secondary to blood loss (chronic): Secondary | ICD-10-CM

## 2023-08-29 DIAGNOSIS — D509 Iron deficiency anemia, unspecified: Secondary | ICD-10-CM | POA: Insufficient documentation

## 2023-08-29 LAB — CBC WITH DIFFERENTIAL (CANCER CENTER ONLY)
Abs Immature Granulocytes: 0.05 10*3/uL (ref 0.00–0.07)
Basophils Absolute: 0 10*3/uL (ref 0.0–0.1)
Basophils Relative: 1 %
Eosinophils Absolute: 0 10*3/uL (ref 0.0–0.5)
Eosinophils Relative: 1 %
HCT: 25.7 % — ABNORMAL LOW (ref 36.0–46.0)
Hemoglobin: 6.7 g/dL — CL (ref 12.0–15.0)
Immature Granulocytes: 1 %
Lymphocytes Relative: 31 %
Lymphs Abs: 1.5 10*3/uL (ref 0.7–4.0)
MCH: 19.6 pg — ABNORMAL LOW (ref 26.0–34.0)
MCHC: 26.1 g/dL — ABNORMAL LOW (ref 30.0–36.0)
MCV: 75.4 fL — ABNORMAL LOW (ref 80.0–100.0)
Monocytes Absolute: 0.4 10*3/uL (ref 0.1–1.0)
Monocytes Relative: 8 %
Neutro Abs: 2.7 10*3/uL (ref 1.7–7.7)
Neutrophils Relative %: 58 %
Platelet Count: UNDETERMINED 10*3/uL (ref 150–400)
RBC: 3.41 MIL/uL — ABNORMAL LOW (ref 3.87–5.11)
RDW: 18.6 % — ABNORMAL HIGH (ref 11.5–15.5)
Smear Review: ADEQUATE
WBC Count: 4.7 10*3/uL (ref 4.0–10.5)
nRBC: 0 % (ref 0.0–0.2)
nRBC: 0 /100{WBCs}

## 2023-08-29 LAB — CMP (CANCER CENTER ONLY)
ALT: 103 U/L — ABNORMAL HIGH (ref 0–44)
AST: 80 U/L — ABNORMAL HIGH (ref 15–41)
Albumin: 3.2 g/dL — ABNORMAL LOW (ref 3.5–5.0)
Alkaline Phosphatase: 182 U/L — ABNORMAL HIGH (ref 38–126)
Anion gap: 13 (ref 5–15)
BUN: 9 mg/dL (ref 6–20)
CO2: 22 mmol/L (ref 22–32)
Calcium: 8.2 mg/dL — ABNORMAL LOW (ref 8.9–10.3)
Chloride: 103 mmol/L (ref 98–111)
Creatinine: 0.83 mg/dL (ref 0.44–1.00)
GFR, Estimated: >60 mL/min (ref >60)
Glucose, Bld: 162 mg/dL — ABNORMAL HIGH (ref 70–99)
Potassium: 3.6 mmol/L (ref 3.5–5.1)
Sodium: 138 mmol/L (ref 135–145)
Total Bilirubin: 0.5 mg/dL (ref 0.0–1.2)
Total Protein: 6.1 g/dL — ABNORMAL LOW (ref 6.5–8.1)

## 2023-08-29 LAB — PROTIME-INR
INR: 8.9 (ref 0.8–1.2)
Prothrombin Time: 72.7 s — ABNORMAL HIGH (ref 11.4–15.2)

## 2023-08-29 LAB — IRON AND TIBC
Iron: 15 ug/dL — ABNORMAL LOW (ref 28–170)
Saturation Ratios: 3 % — ABNORMAL LOW (ref 10.4–31.8)
TIBC: 518 ug/dL — ABNORMAL HIGH (ref 250–450)
UIBC: 503 ug/dL

## 2023-08-29 LAB — FERRITIN: Ferritin: 8 ng/mL — ABNORMAL LOW (ref 11–307)

## 2023-08-29 NOTE — Telephone Encounter (Signed)
 Call from answering service. Patient has INR 8.9. Spoke with patient, she was seen today at clinic, HgB 6.7, declined transfusion. She denies active bleeding. Patient advised to see immediate medical attention if she has bleeding or sustains an injury. Spoke with Dr. Melvyn Neth, if no bleeding hold warfarin for 3 days.

## 2023-08-29 NOTE — Telephone Encounter (Signed)
 Patient has been scheduled for follow-up visit per 08/29/23 LOS.  Pt noted appt details on personal electronic device.

## 2023-08-29 NOTE — Telephone Encounter (Signed)
 CRITICAL VALUE STICKER  CRITICAL VALUE: HGB 6.7  RECEIVER (on-site recipient of call): Marylene Land, LPN   DATE & TIME NOTIFIED: 08/29/23 1348  MESSENGER (representative from lab): Vonna Kotyk at [facility] lab  MD NOTIFIED: Dr. Melvyn Neth   TIME OF NOTIFICATION: 1351   RESPONSE:  ok thanks

## 2023-08-30 ENCOUNTER — Telehealth: Payer: Self-pay

## 2023-08-30 ENCOUNTER — Ambulatory Visit: Payer: Self-pay

## 2023-08-30 ENCOUNTER — Encounter: Payer: Self-pay | Admitting: Oncology

## 2023-08-30 NOTE — Telephone Encounter (Signed)
 Copied from CRM [id number]. Topic: Clinical - Red Word Triage >> Aug 30, 2023  3:49 PM Nyra Capes wrote: Red Word that prompted transfer to Nurse Triage: patient called in, INR 8.9 and just urinated inch long and 1/2 wide blood clot.   Chief Complaint: Blood in Urine  Symptoms: Blood in Urine  Frequency: Acute  Pertinent Negatives: Patient denies fever, weakness, dizziness, fatigue  Disposition: [] ED /[] Urgent Care (no appt availability in office) / [x] Appointment(In office/virtual)/ []  [place] [facility]/ [] Home Care/ [] Refused Recommended Disposition /[] [place] [facility]/ []  Follow-up with PCP Additional Notes: CS is being triaged for passing a blood clot in the urine after an elevated INR level and PT/INR. The patient denies any acute distress like symptoms. Recommended UC as the the first dispositon and made the patient an in office appointment for tomorrow. Patient agreed to disposition.  Reason for Disposition  Taking Coumadin (warfarin) or other strong blood thinner, or known bleeding disorder (e.g., thrombocytopenia)  Answer Assessment - Initial Assessment Questions 1. COLOR of URINE: "Describe the color of the urine."  (e.g., tea-colored, pink, red, bloody) "Do you have blood clots in your urine?" (e.g., none, pea, grape, small coin)     Inch by Inch clot  2. ONSET: "When did the bleeding start?"      Today  3. EPISODES: "How many times has there been blood in the urine?" or "How many times today?"     1 4. PAIN with URINATION: "Is there any pain with passing your urine?" If Yes, ask: "How bad is the pain?"  (Scale 1-10; or mild, moderate, severe)    - MILD: Complains slightly about urination hurting.    - MODERATE: Interferes with normal activities.      - SEVERE: Excruciating, unwilling or unable to urinate because of the pain.      No pain  5. FEVER: "Do you have a fever?" If Yes, ask: "What is your temperature, how was it measured, and when did it start?"      No  6. ASSOCIATED SYMPTOMS: "Are you passing urine more frequently than usual?"     None  7. OTHER SYMPTOMS: "Do you have any other symptoms?" (e.g., back/flank pain, abdomen pain, vomiting)     No  8. PREGNANCY: "Is there any chance you are pregnant?" "When was your last menstrual period?"     No and No  Protocols used: Urine - Blood In-A-AH

## 2023-08-30 NOTE — Telephone Encounter (Signed)
 Pt states, "when I went to urinate I passed a plasma type substance about 1 inch long and 1 inch thick. Should I be alarmed"?  I called pt back to ask a little more information. The clot was dark cranberry in color, "like a mini placenta color". No bright red bleeding. No petechia. No gums bleeding. I instructed pt if she were to start having bright red bleeding vaginally or anywhere, she needed to report to ER. She verbalized understanding.

## 2023-08-30 NOTE — Telephone Encounter (Signed)
 [facility]   Call from answering service. Patient has INR 8.9. Spoke with patient, she was seen today at clinic, HgB 6.7, declined transfusion. She denies active bleeding. Patient advised to see immediate medical attention if she has bleeding or sustains an injury. Spoke with Dr. Melvyn Neth, if no bleeding hold warfarin for 3 days.

## 2023-08-31 ENCOUNTER — Ambulatory Visit: Admitting: Physician Assistant

## 2023-08-31 ENCOUNTER — Encounter: Payer: Self-pay | Admitting: Oncology

## 2023-08-31 ENCOUNTER — Encounter

## 2023-08-31 ENCOUNTER — Inpatient Hospital Stay

## 2023-08-31 VITALS — BP 131/67 | HR 88 | Temp 98.0°F | Resp 18 | Wt 201.0 lb

## 2023-08-31 DIAGNOSIS — D509 Iron deficiency anemia, unspecified: Secondary | ICD-10-CM

## 2023-08-31 MED ORDER — SODIUM CHLORIDE 0.9% FLUSH
10.0000 mL | Freq: Once | INTRAVENOUS | Status: AC | PRN
  Administered 2023-08-31: 10 mL

## 2023-08-31 MED ORDER — IRON SUCROSE 20 MG/ML IV SOLN
200.0000 mg | Freq: Once | INTRAVENOUS | Status: AC
  Administered 2023-08-31: 200 mg via INTRAVENOUS
  Filled 2023-08-31: qty 10

## 2023-08-31 NOTE — Patient Instructions (Signed)

## 2023-09-01 ENCOUNTER — Inpatient Hospital Stay

## 2023-09-01 DIAGNOSIS — D500 Iron deficiency anemia secondary to blood loss (chronic): Secondary | ICD-10-CM

## 2023-09-01 DIAGNOSIS — D509 Iron deficiency anemia, unspecified: Secondary | ICD-10-CM | POA: Diagnosis not present

## 2023-09-01 LAB — PROTIME-INR
INR: 2 — ABNORMAL HIGH (ref 0.8–1.2)
Prothrombin Time: 23.2 s — ABNORMAL HIGH (ref 11.4–15.2)

## 2023-09-04 ENCOUNTER — Inpatient Hospital Stay

## 2023-09-04 VITALS — BP 108/81 | HR 81 | Temp 98.4°F | Resp 16

## 2023-09-04 DIAGNOSIS — D509 Iron deficiency anemia, unspecified: Secondary | ICD-10-CM

## 2023-09-04 MED ORDER — SODIUM CHLORIDE 0.9 % IV SOLN: INTRAVENOUS | Status: DC

## 2023-09-04 MED ORDER — IRON SUCROSE 20 MG/ML IV SOLN
200.0000 mg | Freq: Once | INTRAVENOUS | Status: AC
  Administered 2023-09-04: 200 mg via INTRAVENOUS
  Filled 2023-09-04: qty 10

## 2023-09-04 NOTE — Patient Instructions (Signed)

## 2023-09-06 ENCOUNTER — Inpatient Hospital Stay

## 2023-09-08 ENCOUNTER — Inpatient Hospital Stay

## 2023-09-11 ENCOUNTER — Other Ambulatory Visit: Payer: Self-pay

## 2023-09-11 ENCOUNTER — Inpatient Hospital Stay

## 2023-09-11 VITALS — BP 116/58 | HR 93 | Temp 98.8°F | Resp 16

## 2023-09-11 DIAGNOSIS — D509 Iron deficiency anemia, unspecified: Secondary | ICD-10-CM

## 2023-09-11 DIAGNOSIS — E1159 Type 2 diabetes mellitus with other circulatory complications: Secondary | ICD-10-CM

## 2023-09-11 MED ORDER — DAPAGLIFLOZIN PROPANEDIOL 10 MG PO TABS: 10.0000 mg | ORAL_TABLET | Freq: Every day | ORAL | 0 refills | Status: DC

## 2023-09-11 MED ORDER — IRON SUCROSE 20 MG/ML IV SOLN
200.0000 mg | Freq: Once | INTRAVENOUS | Status: AC
  Administered 2023-09-11: 200 mg via INTRAVENOUS
  Filled 2023-09-11: qty 10

## 2023-09-11 NOTE — Patient Instructions (Signed)

## 2023-09-13 ENCOUNTER — Encounter: Payer: No Typology Code available for payment source | Admitting: Family Medicine

## 2023-09-13 ENCOUNTER — Inpatient Hospital Stay

## 2023-09-13 VITALS — BP 96/58 | HR 87 | Temp 98.1°F | Resp 20

## 2023-09-13 DIAGNOSIS — D509 Iron deficiency anemia, unspecified: Secondary | ICD-10-CM

## 2023-09-13 MED ORDER — IRON SUCROSE 20 MG/ML IV SOLN
200.0000 mg | Freq: Once | INTRAVENOUS | Status: AC
  Administered 2023-09-13: 200 mg via INTRAVENOUS
  Filled 2023-09-13: qty 10

## 2023-09-13 NOTE — Patient Instructions (Signed)

## 2023-09-15 ENCOUNTER — Other Ambulatory Visit: Payer: Self-pay | Admitting: Family Medicine

## 2023-09-15 ENCOUNTER — Inpatient Hospital Stay

## 2023-09-15 VITALS — BP 104/59 | HR 83 | Temp 97.8°F | Resp 18

## 2023-09-15 DIAGNOSIS — D509 Iron deficiency anemia, unspecified: Secondary | ICD-10-CM | POA: Diagnosis not present

## 2023-09-15 DIAGNOSIS — E669 Obesity, unspecified: Secondary | ICD-10-CM

## 2023-09-15 MED ORDER — IRON SUCROSE 20 MG/ML IV SOLN
200.0000 mg | Freq: Once | INTRAVENOUS | Status: AC
  Administered 2023-09-15: 200 mg via INTRAVENOUS
  Filled 2023-09-15: qty 10

## 2023-09-15 NOTE — Telephone Encounter (Signed)
 Copied from CRM [id number]. Topic: Clinical - Medication Refill >> Sep 15, 2023  8:21 AM Gates Kasal P wrote: Most Recent Primary Care Visit:  Provider: [facility]  Department: [facility]  Visit Type: CLINICAL SUPPORT  Date: 06/16/2023  Medication: dapagliflozin  propanediol (FARXIGA ) 10 MG TABS tablet   Has the patient contacted their pharmacy? No (Agent: If no, request that the patient contact the pharmacy for the refill. If patient does not wish to contact the pharmacy document the reason why and proceed with request.) (Agent: If yes, when and what did the pharmacy advise?)  Is this the correct pharmacy for this prescription? Yes If no, delete pharmacy and type the correct one.  This is the patient's preferred pharmacy:  [facility] [address] [place] - [address] [place] [address] Phone: [phone] Fax: [phone]   Has the prescription been filled recently? Yes, 09/11/2023  Is the patient out of the medication? Yes, almost out of medication  Has the patient been seen for an appointment in the last year OR does the patient have an upcoming appointment? Yes  Can we respond through MyChart? Yes  Agent: Please be advised that Rx refills may take up to 3 business days. We ask that you follow-up with your pharmacy.

## 2023-09-15 NOTE — Patient Instructions (Signed)

## 2023-09-17 NOTE — Progress Notes (Unsigned)
 Subjective:  Patient ID: Shannon Erickson, female    DOB: March 08, 1976  Age: 48 y.o. MRN: [id number]  Chief Complaint  Patient presents with   Annual Exam    Discussed the use of AI scribe software for clinical note transcription with the patient, who gave verbal consent to proceed.       Well Adult Physical: Patient here for a comprehensive physical exam.The patient reports {problems:16946} Do you take any herbs or supplements that were not prescribed by a doctor? {yes/no/not asked:9010} Are you taking calcium  supplements? {yes/no:63} Are you taking aspirin  daily? {yes/no:63}  Encounter for general adult medical examination without abnormal findings  Physical ("At Risk" items are starred): Patient's last physical exam was 1 year ago .  Patient is not afflicted from Stress Incontinence and Urge Incontinence  Patient wears a seat belts Patient has smoke detectors and has carbon monoxide detectors. Patient practices appropriate gun safety. Patient wears sunscreen with extended sun exposure. Dental Care: biannual cleanings, brushes and flosses daily. Ophthalmology/Optometry: Annual visit.  Hearing loss: none Vision impairments: none  Menarche: *** Menstrual History: *** LMP: *** Pregnancy history: *** Safe at home: *** Self breast exams: ***     03/07/2023    2:00 PM 01/04/2023    3:14 PM 12/02/2022   10:42 AM 08/03/2022   10:35 AM 12/29/2021   10:15 AM  Depression screen PHQ 2/9  Decreased Interest 0 1 1 0 0  Down, Depressed, Hopeless 0 0 0 0 0  PHQ - 2 Score 0 1 1 0 0  Altered sleeping 1 1 1     Tired, decreased energy 1 1 1     Change in appetite 0 0 0    Feeling bad or failure about yourself  0 0 0    Trouble concentrating 0 1 1    Moving slowly or fidgety/restless 0 0 0    Suicidal thoughts 0 0 0    PHQ-9 Score 2 4 4     Difficult doing work/chores Not difficult at all Somewhat difficult Somewhat difficult           12/29/2021   10:15 AM 08/03/2022   10:40 AM  12/02/2022   10:42 AM 01/04/2023    2:53 PM 03/07/2023    2:01 PM  Fall Risk  Falls in the past year? 0 0 0 0 1  Was there an injury with Fall?  0 0 0 0  Fall Risk Category Calculator  0 0 0 1  Patient at Risk for Falls Due to  Impaired balance/gait;Other (Comment) Impaired balance/gait Impaired balance/gait Impaired balance/gait;Impaired mobility  Patient at Risk for Falls Due to - Comments  walks with cane hx of stroke     Fall risk Follow up  Falls evaluation completed Falls evaluation completed Falls evaluation completed;Education provided Falls evaluation completed             Social Hx   Social History   Socioeconomic History   Marital status: Single    Spouse name: Not on file   Number of children: 3   Years of education: 12   Highest education level: GED or equivalent  Occupational History   Not on file  Tobacco Use   Smoking status: Never   Smokeless tobacco: Never  Vaping Use   Vaping status: Never Used  Substance and Sexual Activity   Alcohol use: No   Drug use: No   Sexual activity: Yes    Partners: Male    Birth control/protection: Surgical  Other  Topics Concern   Not on file  Social History Narrative   Not on file   Social Drivers of Health   Financial Resource Strain: Medium Risk (03/06/2023)   Overall Financial Resource Strain (CARDIA)    Difficulty of Paying Living Expenses: Somewhat hard  Food Insecurity: Food Insecurity Present (03/06/2023)   Hunger Vital Sign    Worried About Running Out of Food in the Last Year: Sometimes true    Ran Out of Food in the Last Year: Sometimes true  Transportation Needs: No Transportation Needs (03/06/2023)   PRAPARE - Administrator, Civil Service (Medical): No    Lack of Transportation (Non-Medical): No  Physical Activity: Insufficiently Active (03/06/2023)   Exercise Vital Sign    Days of Exercise per Week: 5 days    Minutes of Exercise per Session: 10 min  Stress: No Stress Concern Present  (03/06/2023)   [employer] of Occupational Health - Occupational Stress Questionnaire    Feeling of Stress : Only a little  Social Connections: Moderately Integrated (03/06/2023)   Social Connection and Isolation Panel [NHANES]    Frequency of Communication with Friends and Family: More than three times a week    Frequency of Social Gatherings with Friends and Family: Three times a week    Attends Religious Services: More than 4 times per year    Active Member of Clubs or Organizations: Yes    Attends Banker Meetings: 1 to 4 times per year    Marital Status: Divorced  Recent Concern: Social Connections - Moderately Isolated (01/04/2023)   Social Connection and Isolation Panel [NHANES]    Frequency of Communication with Friends and Family: More than three times a week    Frequency of Social Gatherings with Friends and Family: More than three times a week    Attends Religious Services: More than 4 times per year    Active Member of Clubs or Organizations: No    Attends Banker Meetings: Never    Marital Status: Never married   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) 12/10/2016   Past Surgical History:  Procedure Laterality Date   CESAREAN SECTION Bilateral 1995, 2002   CHOLECYSTECTOMY     LYMPH NODE BIOPSY     MOUTH SURGERY Left 07/04/2021   TEE WITHOUT CARDIOVERSION N/A 12/05/2016   Procedure: TRANSESOPHAGEAL ECHOCARDIOGRAM (TEE);  Surgeon: Alroy Aspen Lela Purple, MD;  Location: [facility] ENDOSCOPY;  Service: Cardiovascular;  Laterality: N/A;    Family History  Problem Relation Age of Onset   Lung cancer Mother        mets to brain, bone, liver   Hypertension Father    Heart attack Brother        at age 21   Renal cancer Maternal Grandfather    Heart attack Paternal Grandfather     Review of  Systems   Objective:  There were no vitals taken for this visit.     09/15/2023    3:46 PM 09/15/2023    3:08 PM 09/13/2023    3:44 PM  BP/Weight  Systolic BP 104 131 96  Diastolic BP 59 66 58    Physical Exam  Lab Results  Component Value Date   WBC 4.7 08/29/2023   HGB 6.7 (LL) 08/29/2023  HCT 25.7 (L) 08/29/2023   PLT PLATELET CLUMPS NOTED ON SMEAR, UNABLE TO ESTIMATE 08/29/2023   GLUCOSE 162 (H) 08/29/2023   CHOL 135 06/16/2023   TRIG 107 06/16/2023   HDL 50 06/16/2023   LDLCALC 65 06/16/2023   ALT 103 (H) 08/29/2023   AST 80 (H) 08/29/2023   NA 138 08/29/2023   K 3.6 08/29/2023   CL 103 08/29/2023   CREATININE 0.83 08/29/2023   BUN 9 08/29/2023   CO2 22 08/29/2023   TSH 1.150 12/14/2021   INR 2.0 (H) 09/01/2023   HGBA1C 6.0 (H) 06/16/2023   MICROALBUR 30 03/22/2021      Assessment & Plan:  Assessment and Plan       There are no diagnoses linked to this encounter.   There is no height or weight on file to calculate BMI.   These are the goals we discussed:  Goals      CCM Expected Outcome:  Monitor, Self-Manage and Reduce Symptoms of:     Learn More About My Health     Timeframe:  Long-Range Goal Priority:  High Start Date:                             Expected End Date:                        Follow Up Date 02/2021   - tell my story and reason for my visit - ask questions - bring a list of my medicines to the visit    Why is this important?   The best way to learn about your health and care is by talking to the doctor and nurse.  They will answer your questions and give you information in the way that you like best.    Notes:      Lifestyle Change-Hypertension     Timeframe:  Long-Range Goal Priority:  High Start Date:                             Expected End Date:                       Follow Up Date 02/2021    - agree to work together to make changes    Why is this important?   The changes that you are asked to make may be  hard to do.  This is especially true when the changes are life-long.  Knowing why it is important to you is the first step.  Working on the change with your family or support person helps you not feel alone.  Reward yourself and family or support person when goals are met. This can be an activity you choose like bowling, hiking, biking, swimming or shooting hoops.     Notes:      Set My Target A1C-Diabetes Type 2     Timeframe:  Long-Range Goal Priority:  High Start Date:                             Expected End Date:                       Follow Up Date 02/2021     - set target A1C    Why is this important?  Your target A1C is decided together by you and your doctor.  It is based on several things like your age and other health issues.    Notes:          This is a list of the screening recommended for you and due dates:  Health Maintenance  Topic Date Due   Pneumococcal Vaccination (1 of 2 - PCV) Never done   Pap with HPV screening  Never done   Eye exam for diabetics  01/27/2023   Colon Cancer Screening  01/04/2024*   Hemoglobin A1C  12/14/2023   Flu Shot  12/22/2023   Medicare Annual Wellness Visit  01/04/2024   Complete foot exam   03/06/2024   Yearly kidney health urinalysis for diabetes  06/11/2024   Yearly kidney function blood test for diabetes  08/28/2024   Hepatitis C Screening  Completed   HIV Screening  Completed   HPV Vaccine  Aged Out   Meningitis B Vaccine  Aged Out   DTaP/Tdap/Td vaccine  Discontinued   COVID-19 Vaccine  Discontinued  *Topic was postponed. The date shown is not the original due date.     No orders of the defined types were placed in this encounter.   Follow-up: No follow-ups on file.  An After Visit Summary was printed and given to the patient.  Mercy Stall, MD [facility] [phone]

## 2023-09-18 ENCOUNTER — Ambulatory Visit (INDEPENDENT_AMBULATORY_CARE_PROVIDER_SITE_OTHER): Payer: No Typology Code available for payment source | Admitting: Family Medicine

## 2023-09-18 ENCOUNTER — Encounter: Payer: Self-pay | Admitting: Family Medicine

## 2023-09-18 VITALS — BP 110/68 | HR 81 | Temp 97.4°F | Ht 63.0 in | Wt 194.0 lb

## 2023-09-18 DIAGNOSIS — N921 Excessive and frequent menstruation with irregular cycle: Secondary | ICD-10-CM | POA: Insufficient documentation

## 2023-09-18 DIAGNOSIS — E66811 Obesity, class 1: Secondary | ICD-10-CM

## 2023-09-18 DIAGNOSIS — Z124 Encounter for screening for malignant neoplasm of cervix: Secondary | ICD-10-CM | POA: Diagnosis not present

## 2023-09-18 DIAGNOSIS — Z6834 Body mass index (BMI) 34.0-34.9, adult: Secondary | ICD-10-CM

## 2023-09-18 DIAGNOSIS — Z0001 Encounter for general adult medical examination with abnormal findings: Secondary | ICD-10-CM | POA: Insufficient documentation

## 2023-09-18 DIAGNOSIS — E6609 Other obesity due to excess calories: Secondary | ICD-10-CM

## 2023-09-18 DIAGNOSIS — Z01419 Encounter for gynecological examination (general) (routine) without abnormal findings: Secondary | ICD-10-CM | POA: Diagnosis not present

## 2023-09-18 DIAGNOSIS — N920 Excessive and frequent menstruation with regular cycle: Secondary | ICD-10-CM | POA: Insufficient documentation

## 2023-09-18 NOTE — Assessment & Plan Note (Signed)
Education given. 

## 2023-09-18 NOTE — Assessment & Plan Note (Signed)
 Recommend continue to work on eating healthy diet and exercise. Comorbidities: diabetes

## 2023-09-18 NOTE — Assessment & Plan Note (Signed)
 Pap ordered

## 2023-09-18 NOTE — Assessment & Plan Note (Signed)
 Order pelvic ultrasound

## 2023-09-22 ENCOUNTER — Encounter (HOSPITAL_BASED_OUTPATIENT_CLINIC_OR_DEPARTMENT_OTHER): Payer: Self-pay

## 2023-09-22 ENCOUNTER — Other Ambulatory Visit (HOSPITAL_BASED_OUTPATIENT_CLINIC_OR_DEPARTMENT_OTHER): Payer: Self-pay

## 2023-09-22 ENCOUNTER — Ambulatory Visit (HOSPITAL_BASED_OUTPATIENT_CLINIC_OR_DEPARTMENT_OTHER)
Admission: RE | Admit: 2023-09-22 | Discharge: 2023-09-22 | Disposition: A | Discharge status: Home / Self Care | Source: Ambulatory Visit | Attending: Family Medicine | Admitting: Family Medicine

## 2023-09-22 ENCOUNTER — Encounter: Payer: Self-pay | Admitting: Oncology

## 2023-09-22 ENCOUNTER — Ambulatory Visit (HOSPITAL_BASED_OUTPATIENT_CLINIC_OR_DEPARTMENT_OTHER)

## 2023-09-22 ENCOUNTER — Other Ambulatory Visit: Payer: Self-pay

## 2023-09-22 VITALS — BP 119/6 | HR 99 | Temp 98.4°F | Resp 18

## 2023-09-22 DIAGNOSIS — J0140 Acute pansinusitis, unspecified: Secondary | ICD-10-CM

## 2023-09-22 MED ORDER — SINUS RINSE BOTTLE KIT NA PACK
1.0000 | PACK | Freq: Two times a day (BID) | NASAL | 0 refills | Status: DC | PRN
  Filled 2023-09-22: qty 1, fill #0
  Filled 2023-09-25: qty 50, 15d supply, fill #0

## 2023-09-22 MED ORDER — PREDNISONE 20 MG PO TABS
20.0000 mg | ORAL_TABLET | Freq: Every day | ORAL | 0 refills | Status: AC
  Filled 2023-09-22: qty 5, 5d supply, fill #0

## 2023-09-22 NOTE — Discharge Instructions (Signed)
 Exam is consistent with very early sinusitis without bacterial infection.  Educated about sinusitis.  Encouraged to perform sinus rinses with the sinus rinse bottle kit twice daily for 5 to 7 days and then as needed.  Take prednisone  20 mg 1 daily for 5 days.    Follow-up if symptoms do not improve, worsen or new symptoms occur.  Return if develops dark yellow-brown or dark green nasal discharge as that could indicate a sinus infection.

## 2023-09-22 NOTE — ED Triage Notes (Signed)
 Pt c/o sinus pressure, runny nose, body aches, and post nasal drip x2 days. Took allergy meds with no relief.

## 2023-09-22 NOTE — ED Provider Notes (Signed)
 [facility]    CSN: [id number] Arrival date & time: 09/22/23  1345      History   Chief Complaint Chief Complaint  Patient presents with   Generalized Body Aches    Allergies, body aches. Took allergy medicine (Allegra 24 hour relief) 1day not improvement. Sinus pressure head ache and body ache with runny nose and water eyes. - Entered by patient    HPI Shannon Erickson is a 48 y.o. female.   Patient reports sinus pressure, runny nose, body aches and a lot of postnasal drip since 09/20/2023.  She is taken allergy medicine and it only helped for couple of hours.  She is worried if she has allergies or something viral.  She has not had fever, nausea, vomiting, constipation, diarrhea.     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) 12/10/2016    Patient Active Problem List   Diagnosis Date Noted   Encounter for general adult medical examination with abnormal findings 09/18/2023   Cervical cancer screening 09/18/2023   Menometrorrhagia 09/18/2023   Anemia, iron  deficiency 03/30/2023   Class 1 obesity with serious comorbidity and body mass index (BMI) of 34.0 to 34.9 in adult 03/07/2023   Cirrhosis (HCC) 03/01/2023   Obesity, diabetes, and hypertension syndrome (HCC) 12/02/2022   Deficiency anemia 09/12/2022   Elevated liver enzymes 08/06/2022   Chronic anticoagulation 08/01/2022   Elevated SGOT (AST) 07/27/2022   Left arm weakness 03/29/2022   NASH (nonalcoholic steatohepatitis) 08/16/2021   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   Abnormal nuclear cardiac imaging test 03/26/2018   Hypertension associated with diabetes (HCC) 02/12/2018   Cerebral infarction (HCC) 02/12/2018   Seizure disorder (HCC) 08/21/2017   Cognitive deficit due to recent stroke 02/20/2017    Spasticity 01/17/2017   Absence of bladder continence    Slow transit constipation    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) 12/10/2016   Gait disturbance, post-stroke 12/10/2016   Hemiparesis affecting left side as late effect of cerebrovascular accident [facility])    Left-sided visual neglect    Antiphospholipid syndrome (HCC)    Dysphagia, post-stroke    Thyroid  nodule    Mixed hyperlipidemia    Chronic migraine without aura without status migrainosus, not intractable    Lesion of left ulnar nerve 04/28/2015    Past Surgical History:  Procedure Laterality Date   CESAREAN SECTION Bilateral 1995, 2002   CHOLECYSTECTOMY     LYMPH NODE BIOPSY     MOUTH SURGERY Left 07/04/2021   TEE WITHOUT CARDIOVERSION N/A 12/05/2016   Procedure: TRANSESOPHAGEAL ECHOCARDIOGRAM (TEE);  Surgeon: Alroy Aspen Lela Purple, MD;  Location: [facility] ENDOSCOPY;  Service: Cardiovascular;  Laterality: N/A;    OB History     Gravida  3   Para  3   Term  3   Preterm      AB      Living  3      SAB      IAB      Ectopic      Multiple      Live Births               Home Medications    Prior to Admission medications   Medication Sig Start  Date End Date Taking? Authorizing Provider  Hypertonic Nasal Wash (SINUS RINSE BOTTLE KIT) PACK Place 1 each into the nose 2 (two) times daily as needed. 09/22/23  Yes Guss Legacy, FNP  predniSONE  (DELTASONE ) 20 MG tablet Take 1 tablet (20 mg total) by mouth daily with breakfast for 5 days. 09/22/23 09/27/23 Yes Guss Legacy, FNP  atorvastatin  (LIPITOR ) 80 MG tablet Take 1 tablet (80 mg total) by mouth daily. 09/16/22   CoxBurleigh Carp, MD  dapagliflozin  propanediol (FARXIGA ) 10 MG TABS tablet Take 1 tablet (10 mg total) by mouth daily before breakfast. 09/11/23   Cox, Burleigh Carp, MD  escitalopram  (LEXAPRO ) 10 MG tablet Take 1 tablet (10 mg total) by mouth daily. 02/02/22   Audie Bleacher, MD  levETIRAcetam  (KEPPRA ) 750 MG tablet Take 1 tablet by  mouth twice daily 05/23/23   Cox, Burleigh Carp, MD  nitroGLYCERIN  (NITROSTAT ) 0.4 MG SL tablet Place 1 tablet under the tongue every 5 (five) minutes as needed. 12/22/17   [provider]  warfarin (COUMADIN ) 5 MG tablet TAKE 1 & 1/2 (ONE & ONE-HALF) TABLETS BY MOUTH ONCE DAILY 05/25/23   [place] Hammans, MD    Family History Family History  Problem Relation Age of Onset   Lung cancer Mother        mets to brain, bone, liver   Hypertension Father    Heart attack Brother        at age 24   Renal cancer Maternal Grandfather    Heart attack Paternal Grandfather     Social History Social History   Tobacco Use   Smoking status: Never   Smokeless tobacco: Never  Vaping Use   Vaping status: Never Used  Substance Use Topics   Alcohol use: No   Drug use: No     Allergies   Penicillins   Review of Systems Review of Systems  Constitutional:  Negative for chills and fever.  HENT:  Positive for congestion, postnasal drip, rhinorrhea, sinus pressure and sinus pain. Negative for ear pain and sore throat.   Eyes:  Negative for pain and visual disturbance.  Respiratory:  Negative for cough and shortness of breath.   Cardiovascular:  Negative for chest pain and palpitations.  Gastrointestinal:  Negative for abdominal pain, constipation, diarrhea, nausea and vomiting.  Genitourinary:  Negative for dysuria and hematuria.  Musculoskeletal:  Positive for arthralgias. Negative for back pain.  Skin:  Negative for color change and rash.  Neurological:  Positive for headaches. Negative for seizures and syncope.  All other systems reviewed and are negative.    Physical Exam Triage Vital Signs ED Triage Vitals  Encounter Vitals Group     BP 09/22/23 1411 (!) 119/6     Systolic BP Percentile --      Diastolic BP Percentile --      Pulse Rate 09/22/23 1411 99     Resp 09/22/23 1411 18     Temp 09/22/23 1411 98.4 F (36.9 C)     Temp Source 09/22/23 1411 Oral     SpO2 09/22/23  1411 96 %     Weight --      Height --      Head Circumference --      Peak Flow --      Pain Score 09/22/23 1412 2     Pain Loc --      Pain Education --      Exclude from Growth Chart --    No data found.  Updated Vital  Signs BP (!) 119/6 (BP Location: Right Arm)   Pulse 99   Temp 98.4 F (36.9 C) (Oral)   Resp 18   LMP 08/26/2023   SpO2 96%   Visual Acuity Right Eye Distance:   Left Eye Distance:   Bilateral Distance:    Right Eye Near:   Left Eye Near:    Bilateral Near:     Physical Exam Vitals and nursing note reviewed.  Constitutional:      General: She is not in acute distress.    Appearance: She is well-developed. She is not ill-appearing or toxic-appearing.  HENT:     Head: Normocephalic and atraumatic.     Right Ear: Hearing, tympanic membrane, ear canal and external ear normal.     Left Ear: Hearing, tympanic membrane, ear canal and external ear normal.     Nose: Congestion and rhinorrhea present. Rhinorrhea is clear.     Right Sinus: Maxillary sinus tenderness and frontal sinus tenderness present.     Left Sinus: Maxillary sinus tenderness and frontal sinus tenderness present.     Comments: Sinus pressure is mild and intermittent.    Mouth/Throat:     Lips: Pink.     Mouth: Mucous membranes are moist.     Pharynx: Uvula midline. No oropharyngeal exudate or posterior oropharyngeal erythema.     Tonsils: No tonsillar exudate.  Eyes:     Conjunctiva/sclera: Conjunctivae normal.     Pupils: Pupils are equal, round, and reactive to light.  Cardiovascular:     Rate and Rhythm: Normal rate and regular rhythm.     Heart sounds: S1 normal and S2 normal. No murmur heard. Pulmonary:     Effort: Pulmonary effort is normal. No respiratory distress.     Breath sounds: Normal breath sounds. No decreased breath sounds, wheezing, rhonchi or rales.  Abdominal:     General: Bowel sounds are normal.     Palpations: Abdomen is soft.     Tenderness: There is no  abdominal tenderness.  Musculoskeletal:        General: No swelling.     Cervical back: Neck supple.  Lymphadenopathy:     Head:     Right side of head: No submental, submandibular, tonsillar, preauricular or posterior auricular adenopathy.     Left side of head: No submental, submandibular, tonsillar, preauricular or posterior auricular adenopathy.     Cervical: No cervical adenopathy.     Right cervical: No superficial cervical adenopathy.    Left cervical: No superficial cervical adenopathy.  Skin:    General: Skin is warm and dry.     Capillary Refill: Capillary refill takes less than 2 seconds.     Findings: No rash.  Neurological:     Mental Status: She is alert and oriented to person, place, and time.  Psychiatric:        Mood and Affect: Mood normal.      UC Treatments / Results  Labs (all labs ordered are listed, but only abnormal results are displayed) Labs Reviewed - No data to display  EKG   Radiology No results found.  Procedures Procedures (including critical care time)  Medications Ordered in UC Medications - No data to display  Initial Impression / Assessment and Plan / UC Course  I have reviewed the triage vital signs and the nursing notes.  Pertinent labs & imaging results that were available during my care of the patient were reviewed by me and considered in my medical decision making (see chart  for details).     Plan of Care: Sinusitis: Exam and history are consistent with early sinusitis without infection.  Prednisone  20 mg daily for 5 days.  Encouraged use of sinus rinses twice daily.    Follow-up if symptoms do not improve, worsen or new symptoms occur.  Return if any dark yellow-brown or dark green nasal discharge as this could indicate sinus infection with bacteria and she may need an antibiotic. Final Clinical Impressions(s) / UC Diagnoses   Final diagnoses:  Acute non-recurrent pansinusitis     Discharge Instructions      Exam is  consistent with very early sinusitis without bacterial infection.  Educated about sinusitis.  Encouraged to perform sinus rinses with the sinus rinse bottle kit twice daily for 5 to 7 days and then as needed.  Take prednisone  20 mg 1 daily for 5 days.    Follow-up if symptoms do not improve, worsen or new symptoms occur.  Return if develops dark yellow-brown or dark green nasal discharge as that could indicate a sinus infection.     ED Prescriptions     Medication Sig Dispense Auth. Provider   Hypertonic Nasal Wash (SINUS RINSE BOTTLE KIT) PACK Place 1 each into the nose 2 (two) times daily as needed. 1 each Guss Legacy, FNP   predniSONE  (DELTASONE ) 20 MG tablet Take 1 tablet (20 mg total) by mouth daily with breakfast for 5 days. 5 tablet Aldine Chakraborty, FNP      PDMP not reviewed this encounter.   Guss Legacy, FNP 09/22/23 1444

## 2023-09-25 ENCOUNTER — Ambulatory Visit (INDEPENDENT_AMBULATORY_CARE_PROVIDER_SITE_OTHER)
Admission: RE | Admit: 2023-09-25 | Discharge: 2023-09-25 | Disposition: A | Discharge status: Home / Self Care | Source: Ambulatory Visit | Attending: Family Medicine | Admitting: Family Medicine

## 2023-09-25 ENCOUNTER — Other Ambulatory Visit (HOSPITAL_BASED_OUTPATIENT_CLINIC_OR_DEPARTMENT_OTHER): Payer: Self-pay

## 2023-09-25 ENCOUNTER — Encounter: Payer: Self-pay | Admitting: Oncology

## 2023-09-25 ENCOUNTER — Other Ambulatory Visit: Payer: Self-pay

## 2023-09-25 ENCOUNTER — Encounter: Payer: Self-pay | Admitting: Family Medicine

## 2023-09-25 ENCOUNTER — Ambulatory Visit (HOSPITAL_BASED_OUTPATIENT_CLINIC_OR_DEPARTMENT_OTHER)

## 2023-09-25 DIAGNOSIS — I63511 Cerebral infarction due to unspecified occlusion or stenosis of right middle cerebral artery: Secondary | ICD-10-CM

## 2023-09-25 DIAGNOSIS — N920 Excessive and frequent menstruation with regular cycle: Secondary | ICD-10-CM

## 2023-09-25 DIAGNOSIS — N888 Other specified noninflammatory disorders of cervix uteri: Secondary | ICD-10-CM | POA: Diagnosis not present

## 2023-09-25 DIAGNOSIS — N921 Excessive and frequent menstruation with irregular cycle: Secondary | ICD-10-CM

## 2023-09-25 LAB — IGP, APTIMA HPV, RFX 16/18,45
HPV Aptima: NEGATIVE
PAP Smear Comment: 0

## 2023-09-25 MED ORDER — WARFARIN SODIUM 5 MG PO TABS
7.5000 mg | ORAL_TABLET | Freq: Every day | ORAL | 1 refills | Status: DC
Start: 2023-09-25 — End: 2023-09-29

## 2023-09-26 ENCOUNTER — Other Ambulatory Visit: Payer: Self-pay | Admitting: Family Medicine

## 2023-09-26 ENCOUNTER — Telehealth: Payer: Self-pay

## 2023-09-26 ENCOUNTER — Encounter: Payer: Self-pay | Admitting: Family Medicine

## 2023-09-26 DIAGNOSIS — N920 Excessive and frequent menstruation with regular cycle: Secondary | ICD-10-CM

## 2023-09-26 DIAGNOSIS — R9389 Abnormal findings on diagnostic imaging of other specified body structures: Secondary | ICD-10-CM | POA: Insufficient documentation

## 2023-09-26 NOTE — Telephone Encounter (Signed)
Sent referral. Dr. Sedalia Muta

## 2023-09-26 NOTE — Telephone Encounter (Signed)
 Patient called back. Patient stated that she does not have a gynecologist and doesn't have a preference as long as they are in network.

## 2023-09-28 ENCOUNTER — Other Ambulatory Visit (HOSPITAL_BASED_OUTPATIENT_CLINIC_OR_DEPARTMENT_OTHER): Payer: Self-pay

## 2023-09-28 ENCOUNTER — Inpatient Hospital Stay: Attending: Oncology

## 2023-09-28 DIAGNOSIS — D6861 Antiphospholipid syndrome: Secondary | ICD-10-CM | POA: Diagnosis not present

## 2023-09-28 DIAGNOSIS — D500 Iron deficiency anemia secondary to blood loss (chronic): Secondary | ICD-10-CM

## 2023-09-28 LAB — PROTIME-INR
INR: 4.8 (ref 0.8–1.2)
Prothrombin Time: 45.3 s — ABNORMAL HIGH (ref 11.4–15.2)

## 2023-09-29 ENCOUNTER — Telehealth: Payer: Self-pay | Admitting: Hematology and Oncology

## 2023-09-29 DIAGNOSIS — D6861 Antiphospholipid syndrome: Secondary | ICD-10-CM

## 2023-09-29 MED ORDER — APIXABAN 5 MG PO TABS: ORAL_TABLET | ORAL | 0 refills | Status: DC

## 2023-09-29 NOTE — Telephone Encounter (Signed)
 Spoke with patient, INR was 4.8 last night. She states she continues warfarin 7.5 mg daily. Discussed switching to Eliquis  (apixaban ) for anticoagulation as effective as warfarin and does not require constant monitoring and adjusting. Will hold warfarin starting today and repeat INR on 5/12. When INR is <2, will start apixaban  10 mg twice daily for 7 days, then 5 mg twice daily indefinitely. Appt given. New rx sent. Patient expresses understanding.

## 2023-10-02 ENCOUNTER — Inpatient Hospital Stay

## 2023-10-02 ENCOUNTER — Telehealth: Payer: Self-pay | Admitting: Hematology and Oncology

## 2023-10-02 DIAGNOSIS — D6861 Antiphospholipid syndrome: Secondary | ICD-10-CM | POA: Diagnosis not present

## 2023-10-02 LAB — PROTIME-INR
INR: 1.1 (ref 0.8–1.2)
Prothrombin Time: 14 s (ref 11.4–15.2)

## 2023-10-02 NOTE — Telephone Encounter (Signed)
 INR down to 1.1 today. Spoke with patient to start Eliquis  today. She is to take 2 tabs twice daily for the first week, then 1 tab twice daily indefinitely. She has not picked up prescription yet. Advised her to call if any problems. She expresses understanding.

## 2023-10-23 ENCOUNTER — Other Ambulatory Visit: Payer: Self-pay | Admitting: Family Medicine

## 2023-10-23 ENCOUNTER — Encounter: Payer: Self-pay | Admitting: Oncology

## 2023-10-23 ENCOUNTER — Other Ambulatory Visit: Payer: Self-pay | Admitting: Hematology and Oncology

## 2023-10-23 DIAGNOSIS — D6861 Antiphospholipid syndrome: Secondary | ICD-10-CM

## 2023-10-23 DIAGNOSIS — E782 Mixed hyperlipidemia: Secondary | ICD-10-CM

## 2023-10-23 DIAGNOSIS — I63511 Cerebral infarction due to unspecified occlusion or stenosis of right middle cerebral artery: Secondary | ICD-10-CM

## 2023-10-24 ENCOUNTER — Other Ambulatory Visit: Payer: Self-pay | Admitting: Hematology and Oncology

## 2023-10-24 ENCOUNTER — Encounter: Payer: Self-pay | Admitting: Oncology

## 2023-10-24 ENCOUNTER — Other Ambulatory Visit: Payer: Self-pay | Admitting: Family Medicine

## 2023-10-24 DIAGNOSIS — E782 Mixed hyperlipidemia: Secondary | ICD-10-CM

## 2023-10-24 DIAGNOSIS — D6861 Antiphospholipid syndrome: Secondary | ICD-10-CM

## 2023-10-24 DIAGNOSIS — I63511 Cerebral infarction due to unspecified occlusion or stenosis of right middle cerebral artery: Secondary | ICD-10-CM

## 2023-10-24 DIAGNOSIS — G40909 Epilepsy, unspecified, not intractable, without status epilepticus: Secondary | ICD-10-CM

## 2023-10-24 MED ORDER — APIXABAN 5 MG PO TABS: 5.0000 mg | ORAL_TABLET | Freq: Two times a day (BID) | ORAL | 1 refills | Status: DC

## 2023-10-30 ENCOUNTER — Encounter: Payer: Self-pay | Admitting: Oncology

## 2023-10-30 ENCOUNTER — Inpatient Hospital Stay: Payer: Self-pay | Attending: Oncology

## 2023-10-30 DIAGNOSIS — D500 Iron deficiency anemia secondary to blood loss (chronic): Secondary | ICD-10-CM

## 2023-10-30 DIAGNOSIS — D6861 Antiphospholipid syndrome: Secondary | ICD-10-CM | POA: Diagnosis not present

## 2023-10-30 LAB — PROTIME-INR
INR: 1 (ref 0.8–1.2)
Prothrombin Time: 13.9 s (ref 11.4–15.2)

## 2023-11-17 ENCOUNTER — Encounter: Payer: Self-pay | Admitting: Oncology

## 2023-11-19 ENCOUNTER — Other Ambulatory Visit: Payer: Self-pay | Admitting: Family Medicine

## 2023-11-19 DIAGNOSIS — G40909 Epilepsy, unspecified, not intractable, without status epilepticus: Secondary | ICD-10-CM

## 2023-11-20 ENCOUNTER — Ambulatory Visit (INDEPENDENT_AMBULATORY_CARE_PROVIDER_SITE_OTHER): Admitting: Family Medicine

## 2023-11-20 ENCOUNTER — Encounter: Payer: Self-pay | Admitting: Family Medicine

## 2023-11-20 VITALS — BP 120/74 | HR 90 | Temp 98.4°F | Ht 63.0 in | Wt 198.0 lb

## 2023-11-20 DIAGNOSIS — E1169 Type 2 diabetes mellitus with other specified complication: Secondary | ICD-10-CM

## 2023-11-20 DIAGNOSIS — K7581 Nonalcoholic steatohepatitis (NASH): Secondary | ICD-10-CM | POA: Diagnosis not present

## 2023-11-20 DIAGNOSIS — R7989 Other specified abnormal findings of blood chemistry: Secondary | ICD-10-CM | POA: Insufficient documentation

## 2023-11-20 DIAGNOSIS — D508 Other iron deficiency anemias: Secondary | ICD-10-CM

## 2023-11-20 DIAGNOSIS — G40909 Epilepsy, unspecified, not intractable, without status epilepticus: Secondary | ICD-10-CM | POA: Diagnosis not present

## 2023-11-20 DIAGNOSIS — D6861 Antiphospholipid syndrome: Secondary | ICD-10-CM

## 2023-11-20 DIAGNOSIS — I69354 Hemiplegia and hemiparesis following cerebral infarction affecting left non-dominant side: Secondary | ICD-10-CM

## 2023-11-20 DIAGNOSIS — K7460 Unspecified cirrhosis of liver: Secondary | ICD-10-CM

## 2023-11-20 DIAGNOSIS — E782 Mixed hyperlipidemia: Secondary | ICD-10-CM | POA: Diagnosis not present

## 2023-11-20 NOTE — Progress Notes (Signed)
 Subjective:  Patient ID: Shannon Erickson, female    DOB: 1975/08/21  Age: 48 y.o. MRN: [id number]  Chief Complaint  Patient presents with   Medical Management of Chronic Issues   Discussed the use of AI scribe software for clinical note transcription with the patient, who gave verbal consent to proceed.  History of Present Illness   Shannon Erickson is a 48 year old female with a history of stroke who presents for evaluation of persistent left upper extremity dysfunction.  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  - Persistent left upper extremity dysfunction since stroke on December 02, 2016. Also has foot drop of left foot.  - Zero mobility and strength in left hand and arm despite extensive rehabilitation (hospital, outpatient, and home rehab) - No improvement with exercises using a cane. - Significant impact on activities of daily living, including difficulty with cooking and showering - Requires assistance for daily tasks and uses bars in the shower for support - Uses a cane for mobility - Right-handed, which facilitates some compensation for left-sided impairment  Seizure disorder - Seizure in February 2019, attributed to prior stroke - No seizures since February 2019 - Currently taking Keppra  for seizure prophylaxis  Headache - Intermittent headaches, particularly when driving at night or using screens - Improvement in headache frequency and severity with lifestyle adjustments  Visual function - No visual deficits following stroke - Uses reading glasses - Continues to drive, transitioned from manual to automatic transmission.  Antiphospholipid antibody syndrome - Recently switched from Coumadin  to Eliquis  for anticoagulation. Patient was found to very anemic when PT/INR went very high. Patient has received iron  infusions from Hematology. Sees Dr. Ezzard. Has not had hb rechecked since. Due to have visit with Dr. Ezzard next week.   Dyslipidemia - Taking  atorvastatin  80 mg daily for cholesterol management  Diabetes mellitus - Taking Farxiga  10 mg daily for glycemic control.  - checks feet daily. - Last eye exam: 06/12/2023. She has a normal retinal screening - A1C 6.0.  Anxiety and depression - Taking Lexapro  for mood stabilization  Blood pressure history - No history of hypertension - History of low blood pressure  Constitutional and other symptoms - No fevers, chills, sweats, earaches, sore throat, stuffy nose, chest pain, or breathing problems          11/20/2023    2:00 PM 09/18/2023    1:21 PM 03/07/2023    2:00 PM 01/04/2023    3:14 PM 12/02/2022   10:42 AM  Depression screen PHQ 2/9  Decreased Interest 1 0 0 1 1  Down, Depressed, Hopeless 1 0 0 0 0  PHQ - 2 Score 2 0 0 1 1  Altered sleeping 2 0 1 1 1   Tired, decreased energy 2 1 1 1 1   Change in appetite 1 0 0 0 0  Feeling bad or failure about yourself  0 0 0 0 0  Trouble concentrating 0 0 0 1 1  Moving slowly or fidgety/restless 3 0 0 0 0  Suicidal thoughts 0 0 0 0 0  PHQ-9 Score 10 1 2 4 4   Difficult doing work/chores Extremely dIfficult Not difficult at all Not difficult at all Somewhat difficult Somewhat difficult        09/18/2023    1:21 PM  Fall Risk   Falls in the past year? 0  Number falls in past yr: 0  Injury with Fall? 0  Risk for fall due to : No Fall Risks  Follow up Falls evaluation completed    Patient Care Team: Sherre Clapper, MD as PCP - General (Family Medicine) Bernie Guillermina BROCKS, MD (Inactive) as Consulting Physician (Internal Medicine) [facility] [place], [place]   Review of Systems  Constitutional:  Negative for chills, fatigue and fever.  HENT:  Negative for congestion, ear pain, rhinorrhea and sore throat.   Respiratory:  Negative for cough and shortness of breath.   Cardiovascular:  Negative for chest pain.  Gastrointestinal:  Negative for abdominal pain, constipation, diarrhea, nausea and vomiting.  Genitourinary:  Negative for  dysuria and urgency.       Night time urinary leakage since suffering from a stroke.  Musculoskeletal:  Negative for back pain and myalgias.  Neurological:  Negative for dizziness, weakness, light-headedness and headaches.  Psychiatric/Behavioral:  Negative for dysphoric mood. The patient is not nervous/anxious.     Current Outpatient Medications on File Prior to Visit  Medication Sig Dispense Refill   apixaban  (ELIQUIS ) 5 MG TABS tablet Take 1 tablet (5 mg total) by mouth 2 (two) times daily. 60 tablet 1   atorvastatin  (LIPITOR ) 80 MG tablet Take 1 tablet by mouth once daily 90 tablet 0   dapagliflozin  propanediol (FARXIGA ) 10 MG TABS tablet Take 1 tablet (10 mg total) by mouth daily before breakfast. 90 tablet 0   escitalopram  (LEXAPRO ) 10 MG tablet Take 1 tablet (10 mg total) by mouth daily. 30 tablet 3   levETIRAcetam  (KEPPRA ) 750 MG tablet Take 1 tablet by mouth twice daily 180 tablet 0   nitroGLYCERIN  (NITROSTAT ) 0.4 MG SL tablet Place 1 tablet under the tongue every 5 (five) minutes as needed.  0   No current facility-administered medications on file prior to visit.   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) 12/10/2016   Past Surgical History:  Procedure Laterality Date   CESAREAN SECTION Bilateral 1995, 2002   CHOLECYSTECTOMY     LYMPH NODE BIOPSY     MOUTH SURGERY Left 07/04/2021   TEE WITHOUT CARDIOVERSION N/A 12/05/2016   Procedure: TRANSESOPHAGEAL ECHOCARDIOGRAM (TEE);  Surgeon: Alveta, Aleene PARAS, MD;  Location: [facility] ENDOSCOPY;  Service: Cardiovascular;  Laterality: N/A;    Family History  Problem Relation Age of Onset   Lung cancer Mother        mets to brain, bone, liver   Hypertension Father    Heart attack Brother        at age 62   Renal cancer Maternal Grandfather    Heart attack Paternal Grandfather     Social History   Socioeconomic History   Marital status: Single    Spouse name: Not on file   Number of children: 3   Years of education: 12   Highest education level: GED or equivalent  Occupational History   Not on file  Tobacco Use   Smoking status: Never   Smokeless tobacco: Never  Vaping Use   Vaping status: Never Used  Substance and Sexual Activity   Alcohol use: No   Drug use: No   Sexual activity: Yes    Partners: Male    Birth control/protection: Surgical  Other Topics Concern   Not on file  Social History Narrative   Not on file   Social Drivers of Health   Financial Resource Strain:  Medium Risk (03/06/2023)   Overall Financial Resource Strain (CARDIA)    Difficulty of Paying Living Expenses: Somewhat hard  Food Insecurity: Food Insecurity Present (03/06/2023)   Hunger Vital Sign    Worried About Running Out of Food in the Last Year: Sometimes true    Ran Out of Food in the Last Year: Sometimes true  Transportation Needs: No Transportation Needs (11/20/2023)   PRAPARE - Administrator, Civil Service (Medical): No    Lack of Transportation (Non-Medical): No  Physical Activity: Insufficiently Active (03/06/2023)   Exercise Vital Sign    Days of Exercise per Week: 5 days    Minutes of Exercise per Session: 10 min  Stress: No Stress Concern Present (03/06/2023)   [employer] of Occupational Health - Occupational Stress Questionnaire    Feeling of Stress : Only a little  Social Connections: Moderately Integrated (03/06/2023)   Social Connection and Isolation Panel    Frequency of Communication with Friends and Family: More than three times a week    Frequency of Social Gatherings with Friends and Family: Three times a week    Attends Religious Services: More than 4 times per year    Active Member of Clubs or Organizations: Yes    Attends Banker Meetings: 1 to 4 times per year    Marital Status: Divorced  Recent Concern:  Social Connections - Moderately Isolated (01/04/2023)   Social Connection and Isolation Panel    Frequency of Communication with Friends and Family: More than three times a week    Frequency of Social Gatherings with Friends and Family: More than three times a week    Attends Religious Services: More than 4 times per year    Active Member of [employer] or Organizations: No    Attends Engineer, structural: Never    Marital Status: Never married    Objective:  BP 120/74   Pulse 90   Temp 98.4 F (36.9 C)   Ht 5' 3 (1.6 m)   Wt 198 lb (89.8 kg)   LMP 10/02/2023   SpO2 96%   BMI 35.07 kg/m      11/20/2023    1:59 PM 09/22/2023    2:11 PM 09/18/2023    1:22 PM  BP/Weight  Systolic BP 120 119 110  Diastolic BP 74 6 68  Wt. (Lbs) 198  194  BMI 35.07 kg/m2  34.37 kg/m2    Physical Exam Vitals reviewed.  Constitutional:      Appearance: Normal appearance. She is obese.  Neck:     Vascular: No carotid bruit.   Cardiovascular:     Rate and Rhythm: Normal rate and regular rhythm.     Heart sounds: Normal heart sounds.  Pulmonary:     Effort: Pulmonary effort is normal. No respiratory distress.     Breath sounds: Normal breath sounds.  Abdominal:     General: Abdomen is flat. Bowel sounds are normal.     Palpations: Abdomen is soft.     Tenderness: There is no abdominal tenderness.   Neurological:     Mental Status: She is alert and oriented to person, place, and time.     Motor: Weakness, atrophy and abnormal muscle tone present.     Gait: Gait abnormal.     Comments: Left arm and hand contractured Unable to move.  Poor grip of left hand. Normal grip of right hand.  FROM of right arm/right hand.  FROM of right leg.  Psychiatric:        Mood and Affect: Mood normal.        Behavior: Behavior normal.      Diabetic foot exam was performed with the following findings:   Intact posterior tibialis and dorsalis pedis pulses Decreased sensation of left foot.  Left  foot drop.        Lab Results  Component Value Date   WBC 4.7 08/29/2023   HGB 6.7 (LL) 08/29/2023   HCT 25.7 (L) 08/29/2023   PLT PLATELET CLUMPS NOTED ON SMEAR, UNABLE TO ESTIMATE 08/29/2023   GLUCOSE 162 (H) 08/29/2023   CHOL 135 06/16/2023   TRIG 107 06/16/2023   HDL 50 06/16/2023   LDLCALC 65 06/16/2023   ALT 103 (H) 08/29/2023   AST 80 (H) 08/29/2023   NA 138 08/29/2023   K 3.6 08/29/2023   CL 103 08/29/2023   CREATININE 0.83 08/29/2023   BUN 9 08/29/2023   CO2 22 08/29/2023   TSH 1.150 12/14/2021   INR 1.0 10/30/2023   HGBA1C 6.0 (H) 06/16/2023   MICROALBUR 30 03/22/2021      Assessment & Plan:  Combined hyperlipidemia associated with type 2 diabetes mellitus (HCC) Assessment & Plan: Diabetes and hyperlipidemia control: good Recommend check feet daily. Recommend annual eye exams. Medicines: farxiga  5 mg daily.  Continue to work on eating a healthy diet. Labs drawn today.     Orders: -     Comprehensive metabolic panel with GFR -     Hemoglobin A1c -     Lipid panel -     TSH  Elevated liver function tests Assessment & Plan: Check chemistry panel (liver and kidney function).    Antiphospholipid syndrome (HCC) Assessment & Plan: Continue eliquis .  Management per specialist.     Seizure disorder [facility]) Assessment & Plan: Management per specialist.  The current medical regimen is effective;  continue present plan and medications. Continue keppra    Cirrhosis of liver without ascites, unspecified hepatic cirrhosis type [facility]) Assessment & Plan: Due to NASH.   Hemiparesis affecting left side as late effect of cerebrovascular accident [facility]) Assessment & Plan: Unable to use left arm.  Affects ADLs. Requires assistance to cook, shower, bath.  Able to perform IADLs.  Referring for assistive device.  Completed therapy without further improvement. Plateaued.    NASH (nonalcoholic steatohepatitis) Assessment & Plan: LFTS frequently elevated.     Other iron  deficiency anemia Assessment & Plan: Check blood count, iron  studies.   Orders: -     CBC with Differential/Platelet -     Iron , TIBC and Ferritin Panel    No orders of the defined types were placed in this encounter.   Orders Placed This Encounter  Procedures   CBC with Differential/Platelet   Comprehensive metabolic panel with GFR   Hemoglobin A1c   Lipid panel   Iron , TIBC and Ferritin Panel   TSH     Follow-up: Return in about 3 months (around 02/20/2024) for chronic follow up.   I,Katherina A Bramblett,acting as a scribe for Abigail Free, MD.,have documented all relevant documentation on the behalf of Abigail Free, MD,as directed by  Abigail Free, MD while in the presence of Abigail Free, MD.   An After Visit Summary was printed and given to the patient.  I attest that I have reviewed this visit and agree with the plan scribed by my staff.   Abigail Free, MD [facility] [phone]

## 2023-11-20 NOTE — Assessment & Plan Note (Signed)
 Check blood count, iron  studies.

## 2023-11-20 NOTE — Assessment & Plan Note (Signed)
 Continue eliquis .  Management per specialist.

## 2023-11-20 NOTE — Assessment & Plan Note (Signed)
 Check chemistry panel (liver and kidney function).

## 2023-11-20 NOTE — Assessment & Plan Note (Signed)
 Management per specialist.  The current medical regimen is effective;  continue present plan and medications. Continue keppra

## 2023-11-20 NOTE — Assessment & Plan Note (Signed)
 Diabetes and hyperlipidemia control: good Recommend check feet daily. Recommend annual eye exams. Medicines: farxiga  5 mg daily.  Continue to work on eating a healthy diet. Labs drawn today.

## 2023-11-20 NOTE — Assessment & Plan Note (Signed)
 Unable to use left arm.  Affects ADLs. Requires assistance to cook, shower, bath.  Able to perform IADLs.  Referring for assistive device.  Completed therapy without further improvement. Plateaued.

## 2023-11-20 NOTE — Assessment & Plan Note (Signed)
 Well controlled.  No changes to medicines. Continue atorvastatin 80 mg one before bed.  Continue to work on eating a healthy diet and exercise.  Labs drawn today.

## 2023-11-20 NOTE — Assessment & Plan Note (Signed)
 Due to NASH

## 2023-11-20 NOTE — Assessment & Plan Note (Signed)
 LFTS frequently elevated.

## 2023-11-21 ENCOUNTER — Encounter: Payer: Self-pay | Admitting: Oncology

## 2023-11-21 ENCOUNTER — Ambulatory Visit: Payer: Self-pay | Admitting: Family Medicine

## 2023-11-21 LAB — COMPREHENSIVE METABOLIC PANEL WITH GFR
ALT: 68 IU/L — ABNORMAL HIGH (ref 0–32)
AST: 49 IU/L — ABNORMAL HIGH (ref 0–40)
Albumin: 3.4 g/dL — ABNORMAL LOW (ref 3.9–4.9)
Alkaline Phosphatase: 159 IU/L — ABNORMAL HIGH (ref 44–121)
BUN/Creatinine Ratio: 15 (ref 9–23)
BUN: 12 mg/dL (ref 6–24)
Bilirubin Total: 0.5 mg/dL (ref 0.0–1.2)
CO2: 21 mmol/L (ref 20–29)
Calcium: 8.6 mg/dL — ABNORMAL LOW (ref 8.7–10.2)
Chloride: 104 mmol/L (ref 96–106)
Creatinine, Ser: 0.8 mg/dL (ref 0.57–1.00)
Globulin, Total: 3.4 g/dL (ref 1.5–4.5)
Glucose: 99 mg/dL (ref 70–99)
Potassium: 4.2 mmol/L (ref 3.5–5.2)
Sodium: 139 mmol/L (ref 134–144)
Total Protein: 6.8 g/dL (ref 6.0–8.5)
eGFR: 91 mL/min/{1.73_m2} (ref >59)

## 2023-11-21 LAB — CBC WITH DIFFERENTIAL/PLATELET
Basophils Absolute: 0.1 10*3/uL (ref 0.0–0.2)
Basos: 1 %
EOS (ABSOLUTE): 0.1 10*3/uL (ref 0.0–0.4)
Eos: 2 %
Hematocrit: 45.1 % (ref 34.0–46.6)
Hemoglobin: 14.2 g/dL (ref 11.1–15.9)
Immature Grans (Abs): 0 10*3/uL (ref 0.0–0.1)
Immature Granulocytes: 0 %
Lymphocytes Absolute: 1.7 10*3/uL (ref 0.7–3.1)
Lymphs: 29 %
MCH: 26.6 pg (ref 26.6–33.0)
MCHC: 31.5 g/dL (ref 31.5–35.7)
MCV: 85 fL (ref 79–97)
Monocytes Absolute: 0.5 10*3/uL (ref 0.1–0.9)
Monocytes: 9 %
Neutrophils Absolute: 3.5 10*3/uL (ref 1.4–7.0)
Neutrophils: 59 %
Platelets: 266 10*3/uL (ref 150–450)
RBC: 5.34 x10E6/uL — ABNORMAL HIGH (ref 3.77–5.28)
RDW: 16.9 % — ABNORMAL HIGH (ref 11.7–15.4)
WBC: 5.9 10*3/uL (ref 3.4–10.8)

## 2023-11-21 LAB — IRON,TIBC AND FERRITIN PANEL
Ferritin: 36 ng/mL (ref 15–150)
Iron Saturation: 9 % — CL (ref 15–55)
Iron: 36 ug/dL (ref 27–159)
Total Iron Binding Capacity: 396 ug/dL (ref 250–450)
UIBC: 360 ug/dL (ref 131–425)

## 2023-11-21 LAB — TSH: TSH: 1.03 u[IU]/mL (ref 0.450–4.500)

## 2023-11-21 LAB — LIPID PANEL
Chol/HDL Ratio: 2.8 ratio (ref 0.0–4.4)
Cholesterol, Total: 146 mg/dL (ref 100–199)
HDL: 52 mg/dL (ref >39)
LDL Chol Calc (NIH): 73 mg/dL (ref 0–99)
Triglycerides: 117 mg/dL (ref 0–149)
VLDL Cholesterol Cal: 21 mg/dL (ref 5–40)

## 2023-11-21 LAB — HEMOGLOBIN A1C
Est. average glucose Bld gHb Est-mCnc: 126 mg/dL
Hgb A1c MFr Bld: 6 % — ABNORMAL HIGH (ref 4.8–5.6)

## 2023-11-23 ENCOUNTER — Telehealth: Payer: Self-pay | Admitting: Family Medicine

## 2023-11-23 ENCOUNTER — Encounter: Payer: Self-pay | Admitting: Family Medicine

## 2023-11-23 DIAGNOSIS — G40909 Epilepsy, unspecified, not intractable, without status epilepticus: Secondary | ICD-10-CM

## 2023-11-23 MED ORDER — LEVETIRACETAM 750 MG PO TABS: 750.0000 mg | ORAL_TABLET | Freq: Two times a day (BID) | ORAL | 0 refills | Status: DC

## 2023-11-23 NOTE — Telephone Encounter (Signed)
 Copied from CRM [id number]. Topic: Clinical - Medication Refill >> Nov 23, 2023 11:35 AM Tiffini S wrote: Medication: levETIRAcetam  (KEPPRA ) 750 MG tablet Has the patient contacted their pharmacy? Yes (Agent: If no, request that the patient contact the pharmacy for the refill. If patient does not wish to contact the pharmacy document the reason why and proceed with request.) (Agent: If yes, when and what did the pharmacy advise?)  This is the patient's preferred pharmacy:  [facility] [address] [place] - [address] [place] [address] Phone: [phone] Fax: [phone]   Is this the correct pharmacy for this prescription? Yes If no, delete pharmacy and type the correct one.   Has the prescription been filled recently? Yes  Is the patient out of the medication? Yes, have a few tablets left   Has the patient been seen for an appointment in the last year OR does the patient have an upcoming appointment? Yes  Can we respond through MyChart? No, phone call at [phone]  Agent: Please be advised that Rx refills may take up to 3 business days. We ask that you follow-up with your pharmacy.

## 2023-11-27 NOTE — Progress Notes (Unsigned)
 [facility]  [address] [place],  [place]  [address] [phone]  Clinic Day:  11/28/2023  Referring physician: Sherre Clapper, MD   HISTORY OF PRESENT ILLNESS:  The patient is a 48 y.o. female with antiphospholipid syndrome.  This was diagnosed after having an embolic stroke in 2018.  On 3 separate occasions, her IgG beta-2  glycoprotein antibody level has been very elevated.  Based upon this, she is on chronic anticoagulation.  Due to the wildly fluctuating levels she had with her INR while on warfarin, the patient has since switched to Eliquis  5 mg twice daily.  She claims to be tolerating this much better.  The patient also has issues with iron  deficiency anemia related to her heavy menstrual cycles.  She currently is on her menstrual cycle today, which has lasted for the past week.  The first 4-5 days of this cycle were very heavy and were associated with clots.  She denies having other overt forms of blood loss.   PHYSICAL EXAM:  Blood pressure 123/63, pulse 94, temperature 98.8 F (37.1 C), temperature source Oral, resp. rate 14, height 5' 3 (1.6 m), weight 200 lb (90.7 kg), last menstrual period 10/02/2023, SpO2 99%. Wt Readings from Last 3 Encounters:  11/28/23 200 lb (90.7 kg)  11/20/23 198 lb (89.8 kg)  09/18/23 194 lb (88 kg)   Body mass index is 35.43 kg/m. Performance status (ECOG): 1 - Symptomatic but completely ambulatory Physical Exam Constitutional:      Appearance: Normal appearance. She is not ill-appearing.  HENT:     Mouth/Throat:     Mouth: Mucous membranes are moist.     Pharynx: Oropharynx is clear. No oropharyngeal exudate or posterior oropharyngeal erythema.  Cardiovascular:     Rate and Rhythm: Normal rate and regular rhythm.     Heart sounds: No murmur heard.    No friction rub. No gallop.  Pulmonary:     Effort: Pulmonary effort is normal. No respiratory distress.     Breath sounds: Normal breath sounds. No  wheezing, rhonchi or rales.  Abdominal:     General: Bowel sounds are normal. There is no distension.     Palpations: Abdomen is soft. There is no mass.     Tenderness: There is no abdominal tenderness.  Musculoskeletal:        General: No swelling.     Right lower leg: No edema.     Left lower leg: No edema.  Lymphadenopathy:     Cervical: No cervical adenopathy.     Upper Body:     Right upper body: No supraclavicular or axillary adenopathy.     Left upper body: No supraclavicular or axillary adenopathy.     Lower Body: No right inguinal adenopathy. No left inguinal adenopathy.  Skin:    General: Skin is warm.     Coloration: Skin is not jaundiced.     Findings: No lesion or rash.  Neurological:     General: No focal deficit present.     Mental Status: She is alert and oriented to person, place, and time. Mental status is at baseline.     Motor: Weakness (Left arm) present.  Psychiatric:        Mood and Affect: Mood normal.        Behavior: Behavior normal.        Thought Content: Thought content normal.    LABS:      Latest Ref Rng & Units 11/28/2023  1:36 PM 11/20/2023    3:11 PM 08/29/2023    1:06 PM  CBC  WBC 4.0 - 10.5 K/uL 6.1  5.9  4.7   Hemoglobin 12.0 - 15.0 g/dL 8.5  85.7  6.7   Hematocrit 36.0 - 46.0 % 27.1  45.1  25.7   Platelets 150 - 400 K/uL 239  266  PLATELET CLUMPS NOTED ON SMEAR, UNABLE TO ESTIMATE       Latest Ref Rng & Units 11/20/2023    3:11 PM 08/29/2023    1:06 PM 06/16/2023   11:05 AM  CMP  Glucose 70 - 99 mg/dL 99  837  93   BUN 6 - 24 mg/dL 12  9  13    Creatinine 0.57 - 1.00 mg/dL 9.19  9.16  9.17   Sodium 134 - 144 mmol/L 139  138  138   Potassium 3.5 - 5.2 mmol/L 4.2  3.6  3.9   Chloride 96 - 106 mmol/L 104  103  105   CO2 20 - 29 mmol/L 21  22  20    Calcium  8.7 - 10.2 mg/dL 8.6  8.2  7.9   Total Protein 6.0 - 8.5 g/dL 6.8  6.1  6.6   Total Bilirubin 0.0 - 1.2 mg/dL 0.5  0.5  0.6   Alkaline Phos 44 - 121 IU/L 159  182  199   AST 0 -  40 IU/L 49  80  116   ALT 0 - 32 IU/L 68  103  178     Latest Reference Range & Units 12/04/16 13:16 02/22/17 12:42 08/01/22 15:43  Beta-2  Glycoprotein I Ab, IgG 0 - 20 GPI IgG units 109 (H) >150 (H) 127 (H)  (H): Data is abnormally high   Latest Reference Range & Units 11/28/23 13:36  Iron  28 - 170 ug/dL 18 (L)  UIBC ug/dL 605  TIBC 749 - 549 ug/dL 587  Saturation Ratios 10.4 - 31.8 % 4 (L)  Ferritin 11 - 307 ng/mL 10 (L)  (L): Data is abnormally low  ASSESSMENT & PLAN:  Assessment/Plan:  A 48 y.o. female with antiphospholipid syndrome, which was discovered per a workup for an embolic stroke in 2018.  As this disorder is very hypercoagulable, she is on Eliquis  5 mg twice daily, which she will take indefinitely.  When evaluating her other labs today, there has been a brisk drop in her hemoglobin over just the past week, likely related to her currently heavy menstrual cycle.  Her iron  studies show clear evidence of recurrent iron  deficiency anemia.  Based upon this, I will arrange for her to receive IV iron  over these next few weeks to rapidly replenish her iron  stores and improve her hemoglobin.  I will see her back in 2 months to see how well her iron  and hemoglobin levels have responded to her upcoming IV iron .  The patient understands all the plans discussed today and is in agreement with them.    Celise Bazar DELENA Kerns, MD

## 2023-11-28 ENCOUNTER — Inpatient Hospital Stay

## 2023-11-28 ENCOUNTER — Inpatient Hospital Stay: Attending: Oncology | Admitting: Oncology

## 2023-11-28 VITALS — BP 123/63 | HR 94 | Temp 98.8°F | Resp 14 | Ht 63.0 in | Wt 200.0 lb

## 2023-11-28 DIAGNOSIS — D509 Iron deficiency anemia, unspecified: Secondary | ICD-10-CM | POA: Diagnosis not present

## 2023-11-28 DIAGNOSIS — N920 Excessive and frequent menstruation with regular cycle: Secondary | ICD-10-CM | POA: Diagnosis present

## 2023-11-28 DIAGNOSIS — Z7901 Long term (current) use of anticoagulants: Secondary | ICD-10-CM | POA: Diagnosis not present

## 2023-11-28 DIAGNOSIS — D500 Iron deficiency anemia secondary to blood loss (chronic): Secondary | ICD-10-CM

## 2023-11-28 DIAGNOSIS — D6861 Antiphospholipid syndrome: Secondary | ICD-10-CM | POA: Insufficient documentation

## 2023-11-28 LAB — CBC WITH DIFFERENTIAL (CANCER CENTER ONLY)
Abs Immature Granulocytes: 0.02 K/uL (ref 0.00–0.07)
Basophils Absolute: 0.1 K/uL (ref 0.0–0.1)
Basophils Relative: 1 %
Eosinophils Absolute: 0.1 K/uL (ref 0.0–0.5)
Eosinophils Relative: 2 %
HCT: 27.1 % — ABNORMAL LOW (ref 36.0–46.0)
Hemoglobin: 8.5 g/dL — ABNORMAL LOW (ref 12.0–15.0)
Immature Granulocytes: 0 %
Immature Platelet Fraction: 1.9 % (ref 1.2–8.6)
Lymphocytes Relative: 36 %
Lymphs Abs: 2.2 K/uL (ref 0.7–4.0)
MCH: 26.1 pg (ref 26.0–34.0)
MCHC: 31.4 g/dL (ref 30.0–36.0)
MCV: 83.1 fL (ref 80.0–100.0)
Monocytes Absolute: 0.5 K/uL (ref 0.1–1.0)
Monocytes Relative: 9 %
Neutro Abs: 3.3 K/uL (ref 1.7–7.7)
Neutrophils Relative %: 52 %
Platelet Count: 239 K/uL (ref 150–400)
RBC: 3.26 MIL/uL — ABNORMAL LOW (ref 3.87–5.11)
RDW: 17 % — ABNORMAL HIGH (ref 11.5–15.5)
WBC Count: 6.1 K/uL (ref 4.0–10.5)
nRBC: 0.3 % — ABNORMAL HIGH (ref 0.0–0.2)

## 2023-11-28 LAB — IRON AND TIBC
Iron: 18 ug/dL — ABNORMAL LOW (ref 28–170)
Saturation Ratios: 4 % — ABNORMAL LOW (ref 10.4–31.8)
TIBC: 412 ug/dL (ref 250–450)
UIBC: 394 ug/dL

## 2023-11-28 LAB — PROTIME-INR
INR: 1 (ref 0.8–1.2)
Prothrombin Time: 13.7 s (ref 11.4–15.2)

## 2023-11-28 LAB — FERRITIN: Ferritin: 10 ng/mL — ABNORMAL LOW (ref 11–307)

## 2023-11-29 ENCOUNTER — Encounter: Payer: Self-pay | Admitting: Oncology

## 2023-11-29 ENCOUNTER — Telehealth: Payer: Self-pay

## 2023-11-29 NOTE — Telephone Encounter (Signed)
 Dr Ezzard: Please arrange for pt to receive IV iron  over the next few weeks...thanks  Latest Reference Range & Units 11/28/23 13:36  Iron  28 - 170 ug/dL 18 (L)  UIBC ug/dL 605  TIBC 749 - 549 ug/dL 587  Saturation Ratios 10.4 - 31.8 % 4 (L)  Ferritin 11 - 307 ng/mL 10 (L)  (L): Data is abnormally low

## 2023-11-30 DIAGNOSIS — R9389 Abnormal findings on diagnostic imaging of other specified body structures: Secondary | ICD-10-CM | POA: Diagnosis not present

## 2023-12-01 ENCOUNTER — Encounter: Payer: Self-pay | Admitting: Oncology

## 2023-12-05 ENCOUNTER — Inpatient Hospital Stay

## 2023-12-05 VITALS — BP 100/59 | HR 97 | Temp 98.4°F | Resp 16

## 2023-12-05 DIAGNOSIS — Z7901 Long term (current) use of anticoagulants: Secondary | ICD-10-CM | POA: Diagnosis not present

## 2023-12-05 DIAGNOSIS — D509 Iron deficiency anemia, unspecified: Secondary | ICD-10-CM

## 2023-12-05 DIAGNOSIS — D6861 Antiphospholipid syndrome: Secondary | ICD-10-CM | POA: Diagnosis not present

## 2023-12-05 MED ORDER — IRON SUCROSE 20 MG/ML IV SOLN
200.0000 mg | Freq: Once | INTRAVENOUS | Status: AC
  Administered 2023-12-05: 200 mg via INTRAVENOUS
  Filled 2023-12-05: qty 10

## 2023-12-05 NOTE — Patient Instructions (Signed)

## 2023-12-07 ENCOUNTER — Inpatient Hospital Stay

## 2023-12-07 VITALS — BP 97/62 | HR 91 | Temp 98.4°F | Resp 16

## 2023-12-07 DIAGNOSIS — D509 Iron deficiency anemia, unspecified: Secondary | ICD-10-CM | POA: Diagnosis not present

## 2023-12-07 DIAGNOSIS — Z7901 Long term (current) use of anticoagulants: Secondary | ICD-10-CM | POA: Diagnosis not present

## 2023-12-07 DIAGNOSIS — D6861 Antiphospholipid syndrome: Secondary | ICD-10-CM | POA: Diagnosis not present

## 2023-12-07 MED ORDER — IRON SUCROSE 20 MG/ML IV SOLN
200.0000 mg | Freq: Once | INTRAVENOUS | Status: AC
  Administered 2023-12-07: 200 mg via INTRAVENOUS
  Filled 2023-12-07: qty 10

## 2023-12-07 NOTE — Patient Instructions (Signed)

## 2023-12-11 DIAGNOSIS — R935 Abnormal findings on diagnostic imaging of other abdominal regions, including retroperitoneum: Secondary | ICD-10-CM | POA: Diagnosis not present

## 2023-12-11 DIAGNOSIS — R7989 Other specified abnormal findings of blood chemistry: Secondary | ICD-10-CM | POA: Diagnosis not present

## 2023-12-11 DIAGNOSIS — K760 Fatty (change of) liver, not elsewhere classified: Secondary | ICD-10-CM | POA: Diagnosis not present

## 2023-12-11 DIAGNOSIS — D6861 Antiphospholipid syndrome: Secondary | ICD-10-CM | POA: Diagnosis not present

## 2023-12-11 DIAGNOSIS — D649 Anemia, unspecified: Secondary | ICD-10-CM | POA: Diagnosis not present

## 2023-12-11 DIAGNOSIS — R7401 Elevation of levels of liver transaminase levels: Secondary | ICD-10-CM | POA: Diagnosis not present

## 2023-12-12 ENCOUNTER — Inpatient Hospital Stay

## 2023-12-12 VITALS — BP 102/70 | HR 84 | Temp 98.6°F | Resp 18

## 2023-12-12 DIAGNOSIS — D509 Iron deficiency anemia, unspecified: Secondary | ICD-10-CM

## 2023-12-12 DIAGNOSIS — D6861 Antiphospholipid syndrome: Secondary | ICD-10-CM | POA: Diagnosis not present

## 2023-12-12 DIAGNOSIS — Z7901 Long term (current) use of anticoagulants: Secondary | ICD-10-CM | POA: Diagnosis not present

## 2023-12-12 MED ORDER — IRON SUCROSE 20 MG/ML IV SOLN
200.0000 mg | Freq: Once | INTRAVENOUS | Status: AC
  Administered 2023-12-12: 200 mg via INTRAVENOUS
  Filled 2023-12-12: qty 10

## 2023-12-12 NOTE — Patient Instructions (Signed)

## 2023-12-14 ENCOUNTER — Inpatient Hospital Stay

## 2023-12-14 VITALS — BP 99/62 | HR 80 | Temp 98.3°F | Resp 18

## 2023-12-14 DIAGNOSIS — D6861 Antiphospholipid syndrome: Secondary | ICD-10-CM | POA: Diagnosis not present

## 2023-12-14 DIAGNOSIS — Z7901 Long term (current) use of anticoagulants: Secondary | ICD-10-CM | POA: Diagnosis not present

## 2023-12-14 DIAGNOSIS — D509 Iron deficiency anemia, unspecified: Secondary | ICD-10-CM

## 2023-12-14 MED ORDER — SODIUM CHLORIDE 0.9% FLUSH
10.0000 mL | Freq: Once | INTRAVENOUS | Status: AC | PRN
  Administered 2023-12-14: 10 mL

## 2023-12-14 MED ORDER — IRON SUCROSE 20 MG/ML IV SOLN
200.0000 mg | Freq: Once | INTRAVENOUS | Status: AC
  Administered 2023-12-14: 200 mg via INTRAVENOUS
  Filled 2023-12-14: qty 10

## 2023-12-14 NOTE — Patient Instructions (Signed)

## 2023-12-19 ENCOUNTER — Inpatient Hospital Stay

## 2023-12-19 VITALS — BP 127/73 | HR 69 | Temp 98.0°F | Resp 18

## 2023-12-19 DIAGNOSIS — D509 Iron deficiency anemia, unspecified: Secondary | ICD-10-CM | POA: Diagnosis not present

## 2023-12-19 DIAGNOSIS — D6861 Antiphospholipid syndrome: Secondary | ICD-10-CM | POA: Diagnosis not present

## 2023-12-19 DIAGNOSIS — Z7901 Long term (current) use of anticoagulants: Secondary | ICD-10-CM | POA: Diagnosis not present

## 2023-12-19 MED ORDER — SODIUM CHLORIDE 0.9% FLUSH
10.0000 mL | Freq: Once | INTRAVENOUS | Status: AC | PRN
Start: 2023-12-19 — End: 2023-12-19
  Administered 2023-12-19: 10 mL

## 2023-12-19 MED ORDER — IRON SUCROSE 20 MG/ML IV SOLN
200.0000 mg | Freq: Once | INTRAVENOUS | Status: AC
  Administered 2023-12-19: 200 mg via INTRAVENOUS
  Filled 2023-12-19: qty 10

## 2023-12-19 NOTE — Patient Instructions (Signed)

## 2024-01-01 ENCOUNTER — Other Ambulatory Visit: Payer: Self-pay

## 2024-01-01 ENCOUNTER — Encounter: Payer: Self-pay | Admitting: Oncology

## 2024-01-01 ENCOUNTER — Other Ambulatory Visit (HOSPITAL_BASED_OUTPATIENT_CLINIC_OR_DEPARTMENT_OTHER): Payer: Self-pay

## 2024-01-01 ENCOUNTER — Other Ambulatory Visit (HOSPITAL_COMMUNITY): Payer: Self-pay

## 2024-01-01 DIAGNOSIS — E669 Obesity, unspecified: Secondary | ICD-10-CM

## 2024-01-01 DIAGNOSIS — E1169 Type 2 diabetes mellitus with other specified complication: Secondary | ICD-10-CM

## 2024-01-01 DIAGNOSIS — E782 Mixed hyperlipidemia: Secondary | ICD-10-CM

## 2024-01-01 DIAGNOSIS — G40909 Epilepsy, unspecified, not intractable, without status epilepticus: Secondary | ICD-10-CM

## 2024-01-01 MED ORDER — LEVETIRACETAM 750 MG PO TABS
750.0000 mg | ORAL_TABLET | Freq: Two times a day (BID) | ORAL | 0 refills | Status: DC
  Filled 2024-01-01 – 2024-01-13 (×3): qty 180, 90d supply, fill #0

## 2024-01-01 MED ORDER — DAPAGLIFLOZIN PROPANEDIOL 10 MG PO TABS
10.0000 mg | ORAL_TABLET | Freq: Every day | ORAL | 0 refills | Status: DC
  Filled 2024-01-01: qty 90, 90d supply, fill #0

## 2024-01-01 MED ORDER — ATORVASTATIN CALCIUM 80 MG PO TABS
80.0000 mg | ORAL_TABLET | Freq: Every day | ORAL | 0 refills | Status: DC
  Filled 2024-01-01: qty 90, 90d supply, fill #0

## 2024-01-11 ENCOUNTER — Other Ambulatory Visit (HOSPITAL_BASED_OUTPATIENT_CLINIC_OR_DEPARTMENT_OTHER): Payer: Self-pay

## 2024-01-11 DIAGNOSIS — Z1231 Encounter for screening mammogram for malignant neoplasm of breast: Secondary | ICD-10-CM | POA: Diagnosis not present

## 2024-01-11 DIAGNOSIS — R9389 Abnormal findings on diagnostic imaging of other specified body structures: Secondary | ICD-10-CM | POA: Diagnosis not present

## 2024-01-13 ENCOUNTER — Other Ambulatory Visit (HOSPITAL_COMMUNITY): Payer: Self-pay

## 2024-01-13 DIAGNOSIS — I69354 Hemiplegia and hemiparesis following cerebral infarction affecting left non-dominant side: Secondary | ICD-10-CM | POA: Diagnosis not present

## 2024-01-15 ENCOUNTER — Other Ambulatory Visit: Payer: Self-pay | Admitting: Family Medicine

## 2024-01-15 DIAGNOSIS — I152 Hypertension secondary to endocrine disorders: Secondary | ICD-10-CM

## 2024-01-16 ENCOUNTER — Other Ambulatory Visit: Payer: Self-pay | Admitting: Family Medicine

## 2024-01-16 ENCOUNTER — Other Ambulatory Visit (HOSPITAL_BASED_OUTPATIENT_CLINIC_OR_DEPARTMENT_OTHER): Payer: Self-pay

## 2024-01-16 DIAGNOSIS — E1159 Type 2 diabetes mellitus with other circulatory complications: Secondary | ICD-10-CM

## 2024-01-16 DIAGNOSIS — G40909 Epilepsy, unspecified, not intractable, without status epilepticus: Secondary | ICD-10-CM

## 2024-01-16 MED ORDER — FARXIGA 10 MG PO TABS
10.0000 mg | ORAL_TABLET | Freq: Every day | ORAL | 0 refills | Status: DC
Start: 2024-01-16 — End: 2024-04-14

## 2024-01-16 MED ORDER — LEVETIRACETAM 750 MG PO TABS: 750.0000 mg | ORAL_TABLET | Freq: Two times a day (BID) | ORAL | 0 refills | Status: DC

## 2024-01-16 NOTE — Telephone Encounter (Signed)
 Copied from CRM #[id number]. Topic: Clinical - Medication Refill >> Jan 16, 2024  9:29 AM Gustabo D wrote: Medication: levETIRAcetam  (KEPPRA ) 750 MG tablet FARXIGA  10 MG TABS tablet  Has the patient contacted their pharmacy? No (Agent: If no, request that the patient contact the pharmacy for the refill. If patient does not wish to contact the pharmacy document the reason why and proceed with request.) (Agent: If yes, when and what did the pharmacy advise?)  This is the patient's preferred pharmacy:   [facility] [address], [place] - [address] [place] [address] Phone: [phone] Fax: [phone]  Is this the correct pharmacy for this prescription? Yes If no, delete pharmacy and type the correct one.   Has the prescription been filled recently? No  Is the patient out of the medication? Yes  Has the patient been seen for an appointment in the last year OR does the patient have an upcoming appointment? Yes  Can we respond through MyChart? Yes  Agent: Please be advised that Rx refills may take up to 3 business days. We ask that you follow-up with your pharmacy.

## 2024-01-17 ENCOUNTER — Other Ambulatory Visit: Payer: Self-pay | Admitting: Family Medicine

## 2024-01-17 DIAGNOSIS — E782 Mixed hyperlipidemia: Secondary | ICD-10-CM

## 2024-01-29 NOTE — Progress Notes (Signed)
 [facility] [address] [place],  [place]  [address] [phone]  Clinic Day:  01/30/2024  Referring physician: Sherre Clapper, MD   HISTORY OF PRESENT ILLNESS:  The patient is a 48 y.o. female with antiphospholipid syndrome.  This was diagnosed after having an embolic stroke in 2018.  On 3 separate occasions, her IgG beta-2  glycoprotein antibody level has been very elevated.  Based upon this, she is on chronic anticoagulation.  Due to the wildly fluctuating levels she had with her INR while on warfarin, the patient has since switched to Eliquis  5 mg twice daily.    However, the most pressing issue with this patient recently has been recurrent bouts of iron  deficiency anemia.  This appears secondary to heavy menstrual cycles.  She comes in today to reassess her iron  and hemoglobin levels after receiving IV iron  in July 2025.  The patient claims to feel extremely fatigued today.  She can also feel her heart racing with just minimal ambulation.  The patient states that her last menstrual cycle was 10 days ago.  However, it lasted 9 days, with 7 of those 9 days being associated with thick, heavy clots.  She was recently placed on some form of oral contraception by her gynecologist, but she claims it was recently decreased.  She believes that decrease in dosage led to her last menstrual cycle being extremely heavy.  She denies having other overt forms of blood loss since her last visit.  PHYSICAL EXAM:  Blood pressure (!) 135/56, pulse (!) 133, temperature 98.1 F (36.7 C), temperature source Oral, resp. rate 14, height 5' 3 (1.6 m), weight 205 lb 8 oz (93.2 kg), SpO2 100%. Wt Readings from Last 3 Encounters:  02/09/24 205 lb 12.8 oz (93.4 kg)  01/30/24 205 lb 8 oz (93.2 kg)  11/28/23 200 lb (90.7 kg)   Body mass index is 36.4 kg/m. Performance status (ECOG): 1 - Symptomatic but completely ambulatory Physical Exam Constitutional:      Appearance: She is not  ill-appearing.  HENT:     Mouth/Throat:     Mouth: Mucous membranes are moist.     Pharynx: Oropharynx is clear. No oropharyngeal exudate or posterior oropharyngeal erythema.  Cardiovascular:     Rate and Rhythm: Regular rhythm. Tachycardia present.     Heart sounds: No murmur heard.    No friction rub. No gallop.  Pulmonary:     Effort: Pulmonary effort is normal. No respiratory distress.     Breath sounds: Normal breath sounds. No wheezing, rhonchi or rales.  Abdominal:     General: Bowel sounds are normal. There is no distension.     Palpations: Abdomen is soft. There is no mass.     Tenderness: There is no abdominal tenderness.  Musculoskeletal:        General: No swelling.     Right lower leg: No edema.     Left lower leg: No edema.  Lymphadenopathy:     Cervical: No cervical adenopathy.     Upper Body:     Right upper body: No supraclavicular or axillary adenopathy.     Left upper body: No supraclavicular or axillary adenopathy.     Lower Body: No right inguinal adenopathy. No left inguinal adenopathy.  Skin:    General: Skin is warm.     Coloration: Skin is not jaundiced.     Findings: No lesion or rash.  Neurological:     General: No focal deficit present.  Mental Status: She is alert and oriented to person, place, and time. Mental status is at baseline.     Motor: No weakness (Left arm).  Psychiatric:        Mood and Affect: Mood normal.        Behavior: Behavior normal.        Thought Content: Thought content normal.    LABS:    Latest Reference Range & Units 01/30/24 13:15  WBC 4.0 - 10.5 K/uL 12.5 (H)  RBC 3.87 - 5.11 MIL/uL 2.02 (L)  Hemoglobin 12.0 - 15.0 g/dL 5.5 (LL)  HCT 63.9 - 53.9 % 18.5 (L)  MCV 80.0 - 100.0 fL 91.6  MCH 26.0 - 34.0 pg 27.2  MCHC 30.0 - 36.0 g/dL 70.2 (L)  RDW 88.4 - 84.4 % 17.9 (H)  Platelets 150 - 400 K/uL 254  (LL): Data is critically low (H): Data is abnormally high (L): Data is abnormally low  Latest Reference Range &  Units 01/30/24 13:15  Iron  28 - 170 ug/dL 20 (L)  UIBC ug/dL 627  TIBC 749 - 549 ug/dL 607  Saturation Ratios 10.4 - 31.8 % 5 (L)  Ferritin 11 - 307 ng/mL 28  (L): Data is abnormally low    Latest Ref Rng & Units 11/20/2023    3:11 PM 08/29/2023    1:06 PM 06/16/2023   11:05 AM  CMP  Glucose 70 - 99 mg/dL 99  837  93   BUN 6 - 24 mg/dL 12  9  13    Creatinine 0.57 - 1.00 mg/dL 9.19  9.16  9.17   Sodium 134 - 144 mmol/L 139  138  138   Potassium 3.5 - 5.2 mmol/L 4.2  3.6  3.9   Chloride 96 - 106 mmol/L 104  103  105   CO2 20 - 29 mmol/L 21  22  20    Calcium  8.7 - 10.2 mg/dL 8.6  8.2  7.9   Total Protein 6.0 - 8.5 g/dL 6.8  6.1  6.6   Total Bilirubin 0.0 - 1.2 mg/dL 0.5  0.5  0.6   Alkaline Phos 44 - 121 IU/L 159  182  199   AST 0 - 40 IU/L 49  80  116   ALT 0 - 32 IU/L 68  103  178     Latest Reference Range & Units 12/04/16 13:16 02/22/17 12:42 08/01/22 15:43  Beta-2  Glycoprotein I Ab, IgG 0 - 20 GPI IgG units 109 (H) >150 (H) 127 (H)  (H): Data is abnormally high   ASSESSMENT & PLAN:  Assessment/Plan:  A 48 y.o. female with antiphospholipid syndrome, which was discovered per a workup for an embolic stroke in 2018.  As this disorder is very hypercoagulable, she is on Eliquis  5 mg twice daily, which she will take indefinitely.   With respect to her iron  deficiency anemia, her hemoglobin of 5.5 today is extremely low.  As she is symptomatic from this, I did arrange for her to be seen at the local emergency room where they can transfuse her at least 2 units of blood.  Her iron  studies are consistent of her being severely iron  deficient.  Based upon this, I will arrange for her to receive IV iron  in the near future to replenish her iron  stores and improve her hemoglobin.  I stressed to her the importance of discussing her oral contraceptive therapy with her gynecologist again to see what adjustments can be made to curtail the severity of her menstrual cycles.  I will see her back next week  to ensure she is doing better after receiving her blood transfusion in the emergency room.  The patient understands all the plans discussed today and is in agreement with them.    Xadrian Craighead DELENA Kerns, MD

## 2024-01-30 ENCOUNTER — Inpatient Hospital Stay

## 2024-01-30 ENCOUNTER — Other Ambulatory Visit: Payer: Self-pay | Admitting: Oncology

## 2024-01-30 ENCOUNTER — Inpatient Hospital Stay: Attending: Oncology | Admitting: Oncology

## 2024-01-30 ENCOUNTER — Telehealth: Payer: Self-pay | Admitting: Oncology

## 2024-01-30 VITALS — BP 135/56 | HR 133 | Temp 98.1°F | Resp 14 | Ht 63.0 in | Wt 205.5 lb

## 2024-01-30 DIAGNOSIS — D62 Acute posthemorrhagic anemia: Secondary | ICD-10-CM | POA: Diagnosis not present

## 2024-01-30 DIAGNOSIS — D509 Iron deficiency anemia, unspecified: Secondary | ICD-10-CM | POA: Diagnosis present

## 2024-01-30 DIAGNOSIS — N920 Excessive and frequent menstruation with regular cycle: Secondary | ICD-10-CM | POA: Diagnosis present

## 2024-01-30 DIAGNOSIS — E7800 Pure hypercholesterolemia, unspecified: Secondary | ICD-10-CM | POA: Diagnosis not present

## 2024-01-30 DIAGNOSIS — D500 Iron deficiency anemia secondary to blood loss (chronic): Secondary | ICD-10-CM | POA: Diagnosis not present

## 2024-01-30 DIAGNOSIS — Z7901 Long term (current) use of anticoagulants: Secondary | ICD-10-CM | POA: Insufficient documentation

## 2024-01-30 DIAGNOSIS — I69354 Hemiplegia and hemiparesis following cerebral infarction affecting left non-dominant side: Secondary | ICD-10-CM | POA: Diagnosis not present

## 2024-01-30 DIAGNOSIS — Z7984 Long term (current) use of oral hypoglycemic drugs: Secondary | ICD-10-CM | POA: Diagnosis not present

## 2024-01-30 DIAGNOSIS — I959 Hypotension, unspecified: Secondary | ICD-10-CM | POA: Diagnosis not present

## 2024-01-30 DIAGNOSIS — D6861 Antiphospholipid syndrome: Secondary | ICD-10-CM | POA: Insufficient documentation

## 2024-01-30 DIAGNOSIS — E860 Dehydration: Secondary | ICD-10-CM | POA: Diagnosis not present

## 2024-01-30 DIAGNOSIS — Z79899 Other long term (current) drug therapy: Secondary | ICD-10-CM | POA: Diagnosis not present

## 2024-01-30 DIAGNOSIS — E871 Hypo-osmolality and hyponatremia: Secondary | ICD-10-CM | POA: Diagnosis not present

## 2024-01-30 DIAGNOSIS — Z880 Allergy status to penicillin: Secondary | ICD-10-CM | POA: Diagnosis not present

## 2024-01-30 DIAGNOSIS — R000 Tachycardia, unspecified: Secondary | ICD-10-CM | POA: Diagnosis not present

## 2024-01-30 DIAGNOSIS — E876 Hypokalemia: Secondary | ICD-10-CM | POA: Diagnosis not present

## 2024-01-30 DIAGNOSIS — D649 Anemia, unspecified: Secondary | ICD-10-CM | POA: Diagnosis not present

## 2024-01-30 DIAGNOSIS — E1165 Type 2 diabetes mellitus with hyperglycemia: Secondary | ICD-10-CM | POA: Diagnosis not present

## 2024-01-30 LAB — IRON AND TIBC
Iron: 20 ug/dL — ABNORMAL LOW (ref 28–170)
Saturation Ratios: 5 % — ABNORMAL LOW (ref 10.4–31.8)
TIBC: 392 ug/dL (ref 250–450)
UIBC: 372 ug/dL

## 2024-01-30 LAB — CBC WITH DIFFERENTIAL (CANCER CENTER ONLY)
Abs Immature Granulocytes: 0.17 K/uL — ABNORMAL HIGH (ref 0.00–0.07)
Basophils Absolute: 0.1 K/uL (ref 0.0–0.1)
Basophils Relative: 1 %
Eosinophils Absolute: 0.1 K/uL (ref 0.0–0.5)
Eosinophils Relative: 1 %
HCT: 18.5 % — ABNORMAL LOW (ref 36.0–46.0)
Hemoglobin: 5.5 g/dL — CL (ref 12.0–15.0)
Immature Granulocytes: 1 %
Lymphocytes Relative: 23 %
Lymphs Abs: 2.9 K/uL (ref 0.7–4.0)
MCH: 27.2 pg (ref 26.0–34.0)
MCHC: 29.7 g/dL — ABNORMAL LOW (ref 30.0–36.0)
MCV: 91.6 fL (ref 80.0–100.0)
Monocytes Absolute: 0.9 K/uL (ref 0.1–1.0)
Monocytes Relative: 7 %
Neutro Abs: 8.3 K/uL — ABNORMAL HIGH (ref 1.7–7.7)
Neutrophils Relative %: 67 %
Platelet Count: 254 K/uL (ref 150–400)
RBC: 2.02 MIL/uL — ABNORMAL LOW (ref 3.87–5.11)
RDW: 17.9 % — ABNORMAL HIGH (ref 11.5–15.5)
WBC Count: 12.5 K/uL — ABNORMAL HIGH (ref 4.0–10.5)
nRBC: 1 % — ABNORMAL HIGH (ref 0.0–0.2)

## 2024-01-30 LAB — PROTIME-INR
INR: 1.1 (ref 0.8–1.2)
Prothrombin Time: 14.9 s (ref 11.4–15.2)

## 2024-01-30 LAB — FERRITIN: Ferritin: 28 ng/mL (ref 11–307)

## 2024-01-30 NOTE — Telephone Encounter (Signed)
 Patient has been scheduled for follow-up visit per 01/30/24 LOS.  Pt given an appt calendar with date and time.

## 2024-01-30 NOTE — Progress Notes (Addendum)
 CRITICAL VALUE STICKER  CRITICAL VALUE:  Hgb 5.5  RECEIVER (on-site recipient of call):  Woodie Kapur RN  DATE & TIME NOTIFIED:   01/30/2024 @ 1400  MESSENGER (representative from lab):  [facility]  MD NOTIFIED:   Dr. Ezzard  TIME OF NOTIFICATION:  1407  RESPONSE:   Sent to [facility] ED

## 2024-02-05 ENCOUNTER — Other Ambulatory Visit: Payer: Self-pay

## 2024-02-05 ENCOUNTER — Telehealth: Payer: Self-pay

## 2024-02-05 NOTE — Telephone Encounter (Signed)
 Reviewed. Dr Sedalia Muta

## 2024-02-05 NOTE — Patient Instructions (Signed)
 Visit Information  Thank you for taking time to visit with me today. Please don't hesitate to contact me if I can be of assistance to you before our next scheduled telephone appointment.  Our next appointment is by telephone on 02/13/24 at 10am  Following is a copy of your care plan:   Goals Addressed             This Visit's Progress    VBCI Transitions of Care (TOC) Care Plan       Problems:  Recent Hospitalization for treatment of : Vaginal bleeding, hypotension, posthemorrhagic anemia, antiphospholipid syndrome  - patient states she was sent to hospital with Hgb 5.5 Functional/Safety concern: Patient with left foot drop and is on Eliquis  - denies history of falls - ambulates with cane when outside - resides in small apartment and uses furniture, walls, countertops inside  Goal:  Over the next 30 days, the patient will not experience hospital readmission  Interventions:  Transitions of Care: Doctor Visits  - discussed the importance of doctor visits Reviewed patient history - 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, 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   Anemia/Bleeding Interventions:  Assessment of understanding of anemia/bleeding disorder diagnosis  Basic overview and discussion of anemia/bleeding disorder or acute disease state  Medications reviewed  Counseled on bleeding  risk associated with Eliquis  and importance of self-monitoring for signs/symptoms of bleeding Counseled on avoidance of NSAIDs due to increased bleeding risk Advised to call provider or 911 if active bleeding or signs and symptoms of active bleeding occur Assessed social determinant of health barriers   Lab Results  Component Value Date   WBC 12.5 (H) 01/30/2024   HGB 5.5 (LL) 01/30/2024   HCT 18.5 (L) 01/30/2024   MCV 91.6 01/30/2024   PLT 254 01/30/2024    Patient Self Care Activities:  Attend all scheduled provider appointments Call pharmacy for medication refills 3-7 days in advance of running out of medications Call provider office for new concerns or questions  Notify RN Care Manager of TOC call rescheduling needs Participate in Transition of Care Program/Attend TOC scheduled calls Take medications as prescribed   Notify RN or MD with any signs of bleeding, increased weakness, any fall   Plan:  Telephone follow up appointment with care management team member scheduled for:  02/13/24 10am The patient has been provided with contact information for the care management team and has been advised to call with any health related questions or concerns.         Patient verbalizes understanding of instructions and care plan provided today and agrees to view in MyChart. Active MyChart status and patient understanding of how to access instructions and care plan via MyChart confirmed with patient.     Telephone follow up appointment with care management team member scheduled for: 02/13/24 10am The patient has been provided  with contact information for the care management team and has been advised to call with any health related questions or concerns.   Please call the care guide team at [phone] if you need to cancel or reschedule your appointment.   Please call the Suicide and Crisis Lifeline: 988 call 1-800-273-TALK (toll free, 24 hour hotline) call 911 if you are experiencing a  Mental Health or Behavioral Health Crisis or need someone to talk to.  Shona Prow RN, CCM [place]  VBCI-Population Health RN Care Manager [phone]

## 2024-02-05 NOTE — Transitions of Care (Post Inpatient/ED Visit) (Signed)
 02/05/2024  Name: Shannon Erickson MRN: [id number] DOB: 1975-05-29  Today's TOC FU Call Status: Today's TOC FU Call Status:: Successful TOC FU Call Completed TOC FU Call Complete Date: 02/05/24 Patient's Name and Date of Birth confirmed.  Transition Care Management Follow-up Telephone Call Date of Discharge: 02/02/24 Discharge Facility: Other [facility]) Name of Other (Non[facility]) Discharge Facility: [facility] Type of Discharge: Inpatient Admission Primary Inpatient Discharge Diagnosis:: Vaginal bleeding, hypotension, posthemorrhagic anemia, antiphospholipid syndrome How have you been since you were released from the hospital?: Better Any questions or concerns?: No  Items Reviewed: Did you receive and understand the discharge instructions provided?: Yes Medications obtained,verified, and reconciled?: Yes (Medications Reviewed) Any new allergies since your discharge?: No Dietary orders reviewed?: Yes Type of Diet Ordered:: Healthy diet Do you have support at home?: Yes People in Home [RPT]: child(ren), adult Name of Support/Comfort Primary Source: Son lives close and can help as needed - other 2 live out of state  Medications Reviewed Today: Medications Reviewed Today     Reviewed by [name], RN (Registered Nurse) on 02/05/24 at 0945  Med List Status: <None>   Medication Order Taking? Sig Documenting Provider Last Dose Status Informant  apixaban  (ELIQUIS ) 5 MG TABS tablet [id number] Yes Take 1 tablet (5 mg total) by mouth 2 (two) times daily. Mosher, Kelli A, PA-C  Active   atorvastatin  (LIPITOR ) 80 MG tablet [id number] Yes Take 1 tablet by mouth once daily Cox, Kirsten, MD  Active   escitalopram  (LEXAPRO ) 10 MG tablet [id number]  Take 1 tablet (10 mg total) by mouth daily.  Patient not taking: Reported on 02/05/2024   Abran Jerilynn Loving, MD  Active   FARXIGA  10 MG TABS tablet [id number] Yes Take 1 tablet (10 mg total) by mouth daily before breakfast.  Cox, Kirsten, MD  Active   ferrous sulfate 325 (65 FE) MG EC tablet [id number] Yes Take 325 mg by mouth 2 (two) times daily. [provider]  Active   levETIRAcetam  (KEPPRA ) 750 MG tablet [id number] Yes Take 1 tablet (750 mg total) by mouth 2 (two) times daily. Cox, Kirsten, MD  Active   medroxyPROGESTERone (PROVERA) 10 MG tablet [id number] Yes Take 10 mg by mouth daily. [provider]  Active   nitroGLYCERIN  (NITROSTAT ) 0.4 MG SL tablet [id number]  Place 1 tablet under the tongue every 5 (five) minutes as needed.  Patient not taking: Reported on 02/05/2024   [provider]  Active             Home Care and Equipment/Supplies: Were Home Health Services Ordered?: No Any new equipment or medical supplies ordered?: No  Functional Questionnaire: Do you need assistance with bathing/showering or dressing?: No Do you need assistance with meal preparation?: No Do you need assistance with eating?: No Do you have difficulty maintaining continence: No Do you need assistance with getting out of bed/getting out of a chair/moving?: No Do you have difficulty managing or taking your medications?: No  Follow up appointments reviewed: PCP Follow-up appointment confirmed?: Yes Date of PCP follow-up appointment?: 02/16/24 Follow-up Provider: [facility] Follow-up appointment confirmed?:  (Patient states she will call hematology, Dr Ezzard for follow up) Do you need transportation to your follow-up appointment?: No Do you understand care options if your condition(s) worsen?: Yes-patient verbalized understanding  SDOH Interventions Today    Flowsheet Row Most Recent Value  SDOH Interventions   Food Insecurity Interventions Intervention Not Indicated  Housing Interventions Intervention Not Indicated  Transportation Interventions  Intervention Not Indicated  Utilities Interventions Intervention Not Indicated    Goals Addressed             This Visit's  Progress    VBCI Transitions of Care (TOC) Care Plan       Problems:  Recent Hospitalization for treatment of : Vaginal bleeding, hypotension, posthemorrhagic anemia, antiphospholipid syndrome  - patient states she was sent to hospital with Hgb 5.5 Functional/Safety concern: Patient with left foot drop and is on Eliquis  - denies history of falls - ambulates with cane when outside - resides in small apartment and uses furniture, walls, countertops inside  Goal:  Over the next 30 days, the patient will not experience hospital readmission  Interventions:  Transitions of Care: Doctor Visits  - discussed the importance of doctor visits Reviewed patient history - 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, 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   Anemia/Bleeding Interventions:  Assessment of understanding of anemia/bleeding disorder diagnosis  Basic overview and discussion of anemia/bleeding disorder or acute disease state  Medications reviewed  Counseled on bleeding risk associated with Eliquis  and importance of self-monitoring for signs/symptoms of bleeding Counseled on avoidance of NSAIDs due to increased bleeding risk Advised to call provider or 911 if active bleeding or signs and symptoms of active bleeding occur Assessed social determinant of health barriers   Lab Results  Component Value  Date   WBC 12.5 (H) 01/30/2024   HGB 5.5 (LL) 01/30/2024   HCT 18.5 (L) 01/30/2024   MCV 91.6 01/30/2024   PLT 254 01/30/2024    Patient Self Care Activities:  Attend all scheduled provider appointments Call pharmacy for medication refills 3-7 days in advance of running out of medications Call provider office for new concerns or questions  Notify RN Care Manager of TOC call rescheduling needs Participate in Transition of Care Program/Attend TOC scheduled calls Take medications as prescribed   Notify RN or MD with any signs of bleeding, increased weakness, any fall   Plan:  Telephone follow up appointment with care management team member scheduled for:  02/13/24 10am The patient has been provided with contact information for the care management team and has been advised to call with any health related questions or concerns.         Shona Prow RN, CCM [place]  VBCI-Population Health RN Care Manager [phone]

## 2024-02-08 ENCOUNTER — Other Ambulatory Visit: Payer: Self-pay

## 2024-02-08 NOTE — Progress Notes (Signed)
 [facility]  [address] [place],  [place]  [address] [phone]  Clinic Day:  02/09/2024  Referring physician: Sherre Clapper, MD   HISTORY OF PRESENT ILLNESS:  The patient is a 48 y.o. female with antiphospholipid syndrome.  This was diagnosed after having an embolic stroke in 2018.  On 3 separate occasions, her IgG beta-2  glycoprotein antibody level has been very elevated.  Based upon this, she is on chronic anticoagulation.  Due to the wildly fluctuating levels she had with her INR while on warfarin, the patient has since switched to Eliquis  5 mg twice daily.    However, the most pressing issue with this patient recently has been recurrent bouts of iron  deficiency anemia.  The patient was just admitted to the hospital last week after being transferred from our facility.  Labs at that time showed a severely low hemoglobin of only 5.5.  In addition to being given multiple units of blood, gynecology saw her and has increased her Provera back up to 10 mg twice daily.  Since this has been done, the patient's extremely heavy menstrual cycles have been brought under much better control.  Of note, this patient last received IV iron  in July 2025.  She comes into clinic feeling much better today.  She hopes that the increase in her Provera to 10 mg twice a day will significantly bring down the heaviness of her menstrual cycles to where severe iron  deficiency anemia will likely not recur.  PHYSICAL EXAM:  Blood pressure 102/60, pulse 99, temperature 98.6 F (37 C), temperature source Oral, resp. rate 16, height 5' 3 (1.6 m), weight 205 lb 12.8 oz (93.4 kg), SpO2 100%. Wt Readings from Last 3 Encounters:  02/09/24 205 lb 12.8 oz (93.4 kg)  01/30/24 205 lb 8 oz (93.2 kg)  11/28/23 200 lb (90.7 kg)   Body mass index is 36.46 kg/m. Performance status (ECOG): 1 - Symptomatic but completely ambulatory Physical Exam Constitutional:      Appearance: She is not  ill-appearing.  HENT:     Mouth/Throat:     Mouth: Mucous membranes are moist.     Pharynx: Oropharynx is clear. No oropharyngeal exudate or posterior oropharyngeal erythema.  Cardiovascular:     Rate and Rhythm: Normal rate and regular rhythm.     Heart sounds: No murmur heard.    No friction rub. No gallop.  Pulmonary:     Effort: Pulmonary effort is normal. No respiratory distress.     Breath sounds: Normal breath sounds. No wheezing, rhonchi or rales.  Abdominal:     General: Bowel sounds are normal. There is no distension.     Palpations: Abdomen is soft. There is no mass.     Tenderness: There is no abdominal tenderness.  Musculoskeletal:        General: No swelling.     Right lower leg: No edema.     Left lower leg: No edema.  Lymphadenopathy:     Cervical: No cervical adenopathy.     Upper Body:     Right upper body: No supraclavicular or axillary adenopathy.     Left upper body: No supraclavicular or axillary adenopathy.     Lower Body: No right inguinal adenopathy. No left inguinal adenopathy.  Skin:    General: Skin is warm.     Coloration: Skin is not jaundiced.     Findings: No lesion or rash.  Neurological:     General: No focal deficit present.  Mental Status: She is alert and oriented to person, place, and time. Mental status is at baseline.     Motor: No weakness (Left arm).  Psychiatric:        Mood and Affect: Mood normal.        Behavior: Behavior normal.        Thought Content: Thought content normal.    LABS:      Latest Ref Rng & Units 02/09/2024    3:43 PM 01/30/2024    1:15 PM 11/28/2023    1:36 PM  CBC  WBC 4.0 - 10.5 K/uL 6.3  12.5  6.1   Hemoglobin 12.0 - 15.0 g/dL 8.5  5.5  8.5   Hematocrit 36.0 - 46.0 % 29.2  18.5  27.1   Platelets 150 - 400 K/uL 264  254  239       Latest Ref Rng & Units 11/20/2023    3:11 PM 08/29/2023    1:06 PM 06/16/2023   11:05 AM  CMP  Glucose 70 - 99 mg/dL 99  837  93   BUN 6 - 24 mg/dL 12  9  13     Creatinine 0.57 - 1.00 mg/dL 9.19  9.16  9.17   Sodium 134 - 144 mmol/L 139  138  138   Potassium 3.5 - 5.2 mmol/L 4.2  3.6  3.9   Chloride 96 - 106 mmol/L 104  103  105   CO2 20 - 29 mmol/L 21  22  20    Calcium  8.7 - 10.2 mg/dL 8.6  8.2  7.9   Total Protein 6.0 - 8.5 g/dL 6.8  6.1  6.6   Total Bilirubin 0.0 - 1.2 mg/dL 0.5  0.5  0.6   Alkaline Phos 44 - 121 IU/L 159  182  199   AST 0 - 40 IU/L 49  80  116   ALT 0 - 32 IU/L 68  103  178     Latest Reference Range & Units 12/04/16 13:16 02/22/17 12:42 08/01/22 15:43  Beta-2  Glycoprotein I Ab, IgG 0 - 20 GPI IgG units 109 (H) >150 (H) 127 (H)  (H): Data is abnormally high   Latest Reference Range & Units 01/30/24 13:15  Iron  28 - 170 ug/dL 20 (L)  UIBC ug/dL 627  TIBC 749 - 549 ug/dL 607  Saturation Ratios 10.4 - 31.8 % 5 (L)  Ferritin 11 - 307 ng/mL 28  (L): Data is abnormally low   ASSESSMENT & PLAN:  Assessment/Plan:  A 48 y.o. female with antiphospholipid syndrome, which was discovered per a workup for an embolic stroke in 2018.  As this disorder is very hypercoagulable, she is on Eliquis  5 mg twice daily, which she will take indefinitely.   With respect to her iron  deficiency anemia, I am pleased that her hemoglobin of 8.5 is better than it was last week.  As recent iron  parameters showed her to be iron  deficient, I will also arrange for her to receive IV iron  over these next few weeks to further replenish her iron  stores and improve her hemoglobin.  She knows to continue to work with her gynecologist with respect to adjusting her oral contraceptive therapy to where it will significantly curtail the heaviness of her menstrual cycles.  Otherwise, I will see this patient back in 3 months to reassess her iron  and hemoglobin levels to see how well she responded to her upcoming course of IV iron . The patient understands all the plans discussed today and is in  agreement with them.    Rolene Andrades DELENA Kerns, MD

## 2024-02-09 ENCOUNTER — Inpatient Hospital Stay (HOSPITAL_BASED_OUTPATIENT_CLINIC_OR_DEPARTMENT_OTHER): Admitting: Oncology

## 2024-02-09 ENCOUNTER — Other Ambulatory Visit: Payer: Self-pay | Admitting: Medical Genetics

## 2024-02-09 ENCOUNTER — Encounter: Payer: Self-pay | Admitting: Oncology

## 2024-02-09 ENCOUNTER — Other Ambulatory Visit: Payer: Self-pay | Admitting: Oncology

## 2024-02-09 ENCOUNTER — Other Ambulatory Visit: Payer: Self-pay

## 2024-02-09 ENCOUNTER — Inpatient Hospital Stay

## 2024-02-09 VITALS — BP 102/60 | HR 99 | Temp 98.6°F | Resp 16 | Ht 63.0 in | Wt 205.8 lb

## 2024-02-09 DIAGNOSIS — D500 Iron deficiency anemia secondary to blood loss (chronic): Secondary | ICD-10-CM | POA: Diagnosis not present

## 2024-02-09 DIAGNOSIS — D6861 Antiphospholipid syndrome: Secondary | ICD-10-CM | POA: Diagnosis not present

## 2024-02-09 DIAGNOSIS — D509 Iron deficiency anemia, unspecified: Secondary | ICD-10-CM

## 2024-02-09 DIAGNOSIS — Z7901 Long term (current) use of anticoagulants: Secondary | ICD-10-CM | POA: Diagnosis not present

## 2024-02-09 LAB — CBC WITH DIFFERENTIAL (CANCER CENTER ONLY)
Abs Immature Granulocytes: 0.03 K/uL (ref 0.00–0.07)
Basophils Absolute: 0 K/uL (ref 0.0–0.1)
Basophils Relative: 1 %
Eosinophils Absolute: 0.1 K/uL (ref 0.0–0.5)
Eosinophils Relative: 2 %
HCT: 29.2 % — ABNORMAL LOW (ref 36.0–46.0)
Hemoglobin: 8.5 g/dL — ABNORMAL LOW (ref 12.0–15.0)
Immature Granulocytes: 1 %
Lymphocytes Relative: 25 %
Lymphs Abs: 1.6 K/uL (ref 0.7–4.0)
MCH: 27.2 pg (ref 26.0–34.0)
MCHC: 29.1 g/dL — ABNORMAL LOW (ref 30.0–36.0)
MCV: 93.3 fL (ref 80.0–100.0)
Monocytes Absolute: 0.5 K/uL (ref 0.1–1.0)
Monocytes Relative: 9 %
Neutro Abs: 4 K/uL (ref 1.7–7.7)
Neutrophils Relative %: 62 %
Platelet Count: 264 K/uL (ref 150–400)
RBC: 3.13 MIL/uL — ABNORMAL LOW (ref 3.87–5.11)
RDW: 17.2 % — ABNORMAL HIGH (ref 11.5–15.5)
WBC Count: 6.3 K/uL (ref 4.0–10.5)
nRBC: 0 % (ref 0.0–0.2)

## 2024-02-09 LAB — PROTIME-INR
INR: 1 (ref 0.8–1.2)
Prothrombin Time: 13.5 s (ref 11.4–15.2)

## 2024-02-10 ENCOUNTER — Encounter: Payer: Self-pay | Admitting: Oncology

## 2024-02-13 ENCOUNTER — Encounter: Payer: Self-pay | Admitting: Oncology

## 2024-02-13 ENCOUNTER — Other Ambulatory Visit: Payer: Self-pay

## 2024-02-13 NOTE — Transitions of Care (Post Inpatient/ED Visit) (Signed)
 Transition of Care week 2  Visit Note  02/13/2024  Name: Shannon Erickson MRN: [id number]          DOB: 07/21/1975  Situation: Patient enrolled in [facility] 30-day program. Visit completed with patient by telephone.   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    Assessment: Patient Reported Symptoms: Cognitive Cognitive Status: No symptoms reported, Normal speech and language skills, Alert and oriented to person, place, and time      Neurological Neurological Review of Symptoms: Other: [place] Neurological Symptoms/Conditions [RPT]: patient states she feels safe ambulating and states she has a brace for left foot with foot drop - continues to deny seizures since starting Keppra     HEENT HEENT Symptoms Reported: No symptoms reported      Cardiovascular Cardiovascular Symptoms Reported: Swelling in legs or feet Cardiovascular Comment: patient states swelling of left leg is unchanged since stroke  Respiratory Respiratory Symptoms Reported: No symptoms reported    Endocrine Endocrine Symptoms Reported: No symptoms reported    Gastrointestinal Gastrointestinal Symptoms Reported: No symptoms reported      Genitourinary Genitourinary Symptoms Reported: No symptoms reported    Integumentary Integumentary Symptoms Reported: No symptoms reported    Musculoskeletal Musculoskelatal Symptoms Reviewed: Other Other Musculoskeletal Symptoms: Left foot drop with brace - denies fall        Psychosocial Psychosocial Symptoms Reported: No symptoms  reported         There were no vitals filed for this visit.  Medications Reviewed Today     Reviewed by [name], RN (Registered Nurse) on 02/13/24 at 1014  Med List Status: <None>   Medication Order Taking? Sig Documenting Provider Last Dose Status Informant  apixaban  (ELIQUIS ) 5 MG TABS tablet [id number] Yes Take 1 tablet (5 mg total) by mouth 2 (two) times daily. Mosher, Andrez LABOR, PA-C  Active   atorvastatin  (LIPITOR ) 80 MG tablet [id number] Yes Take 1 tablet by mouth once daily Cox, Kirsten, MD  Active   escitalopram  (LEXAPRO ) 10 MG tablet [id number]  Take 1 tablet (10 mg total) by mouth daily.  Patient not taking: Reported on 02/13/2024   Abran Jerilynn Loving, MD  Active   FARXIGA  10 MG TABS tablet [id number] Yes Take 1 tablet (10 mg total) by mouth daily before breakfast. Cox, Kirsten, MD  Active   ferrous sulfate 325 (65 FE) MG EC tablet [id number]  Take 325 mg by mouth 2 (two) times daily.  Patient not taking: Reported on 02/13/2024   [provider]  Active   levETIRAcetam  (KEPPRA ) 750 MG tablet [id number] Yes Take 1 tablet (750 mg total) by mouth 2 (two) times daily. Cox, Kirsten, MD  Active   medroxyPROGESTERone (PROVERA) 10 MG tablet [id number] Yes Take 10 mg by mouth 2 (two) times daily. [provider]  Active   nitroGLYCERIN  (NITROSTAT ) 0.4 MG SL tablet [id number]  Place 1 tablet under the tongue every 5 (five) minutes as needed.  Patient not taking: Reported on 02/13/2024   [provider]  Active  Recommendation:   Continue Current Plan of Care  Follow Up Plan:   Telephone follow up appointment date/time:  02/20/24 11am  Shona Prow RN, CCM [place]  VBCI-Population Health RN Care Manager [phone]

## 2024-02-13 NOTE — Patient Instructions (Signed)
 Visit Information  Thank you for taking time to visit with me today. Please don't hesitate to contact me if I can be of assistance to you before our next scheduled telephone appointment.  Our next appointment is by telephone on 02/20/24 at 11am  Following is a copy of your care plan:   Goals Addressed             This Visit's Progress    VBCI Transitions of Care (TOC) Care Plan       Problems:  Recent Hospitalization for treatment of : Vaginal bleeding, hypotension, posthemorrhagic anemia, antiphospholipid syndrome  - patient states she was sent to hospital with Hgb 5.5 Functional/Safety concern: Patient with left foot drop and is on Eliquis  - denies history of falls - ambulates with cane when outside - resides in small apartment and uses furniture, walls, countertops inside  Goal:  Over the next 30 days, the patient will not experience hospital readmission  Interventions:  Transitions of Care: Doctor Visits  - discussed the importance of doctor visits Reviewed patient history - 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, 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   Anemia/Bleeding Interventions:  Assessment of understanding of anemia/bleeding disorder diagnosis  Basic overview and discussion of anemia/bleeding disorder or acute disease state  Medications reviewed  Counseled on bleeding  risk associated with Eliquis  and importance of self-monitoring for signs/symptoms of bleeding Counseled on avoidance of NSAIDs due to increased bleeding risk Advised to call provider or 911 if active bleeding or signs and symptoms of active bleeding occur Assessed social determinant of health barriers  Update 02/13/24: Patient states she is feeling better, denies any further vaginal bleeding-saw hematologist, Dr Kerns 02/09/24 and Hgb was 8.5 up from 5.5 on 01/30/24 - Patient has iron  infusions scheduled for 9/24, 9/25, 10/1, 10/3, 02/27/24 and follow up with Dr Kerns with labs 03/04/24 - Patient denied any new issues or concerns - Patient denied any depression/anxiety and states she is still not using Lexapro  as she does not feel it's needed.   Lab Results  Component Value Date   WBC 12.5 (H) 01/30/2024   HGB 5.5 (LL) 01/30/2024   HCT 18.5 (L) 01/30/2024   MCV 91.6 01/30/2024   PLT 254 01/30/2024    Patient Self Care Activities:  Attend all scheduled provider appointments Call pharmacy for medication refills 3-7 days in advance of running out of medications Call provider office for new concerns or questions  Notify RN Care Manager of TOC call rescheduling needs Participate in Transition of Care Program/Attend TOC scheduled calls Take medications as prescribed   Notify RN or MD with any signs of bleeding, increased weakness, any fall   Plan:  Telephone follow up appointment with care management team member scheduled for:  02/20/24 11am The patient has been provided with contact information for the care management team and has  been advised to call with any health related questions or concerns.         Patient verbalizes understanding of instructions and care plan provided today and agrees to view in MyChart. Active MyChart status and patient understanding of how to access instructions and care plan via MyChart confirmed with patient.     Telephone follow up appointment with care management  team member scheduled for: 02/20/24 The patient has been provided with contact information for the care management team and has been advised to call with any health related questions or concerns.   Please call the care guide team at [phone] if you need to cancel or reschedule your appointment.   Please call the Suicide and Crisis Lifeline: 988 call 911 if you are experiencing a Mental Health or Behavioral Health Crisis or need someone to talk to. Shona Prow RN, CCM [place]  VBCI-Population Health RN Care Manager [phone]

## 2024-02-14 ENCOUNTER — Encounter: Payer: Self-pay | Admitting: Oncology

## 2024-02-14 ENCOUNTER — Inpatient Hospital Stay

## 2024-02-14 VITALS — BP 115/57 | HR 99 | Temp 98.0°F | Resp 16

## 2024-02-14 DIAGNOSIS — D6861 Antiphospholipid syndrome: Secondary | ICD-10-CM | POA: Diagnosis not present

## 2024-02-14 DIAGNOSIS — D509 Iron deficiency anemia, unspecified: Secondary | ICD-10-CM | POA: Diagnosis not present

## 2024-02-14 DIAGNOSIS — Z7901 Long term (current) use of anticoagulants: Secondary | ICD-10-CM | POA: Diagnosis not present

## 2024-02-14 MED ORDER — IRON SUCROSE 20 MG/ML IV SOLN
200.0000 mg | Freq: Once | INTRAVENOUS | Status: AC
  Administered 2024-02-14: 200 mg via INTRAVENOUS
  Filled 2024-02-14: qty 10

## 2024-02-14 NOTE — Patient Instructions (Signed)

## 2024-02-15 ENCOUNTER — Inpatient Hospital Stay

## 2024-02-15 VITALS — BP 102/64 | HR 86 | Temp 98.1°F | Resp 18

## 2024-02-15 DIAGNOSIS — Z7901 Long term (current) use of anticoagulants: Secondary | ICD-10-CM | POA: Diagnosis not present

## 2024-02-15 DIAGNOSIS — D509 Iron deficiency anemia, unspecified: Secondary | ICD-10-CM

## 2024-02-15 DIAGNOSIS — D6861 Antiphospholipid syndrome: Secondary | ICD-10-CM | POA: Diagnosis not present

## 2024-02-15 MED ORDER — IRON SUCROSE 20 MG/ML IV SOLN
200.0000 mg | Freq: Once | INTRAVENOUS | Status: AC
  Administered 2024-02-15: 200 mg via INTRAVENOUS
  Filled 2024-02-15: qty 10

## 2024-02-15 NOTE — Patient Instructions (Signed)

## 2024-02-16 ENCOUNTER — Encounter: Payer: Self-pay | Admitting: Family Medicine

## 2024-02-16 ENCOUNTER — Ambulatory Visit (INDEPENDENT_AMBULATORY_CARE_PROVIDER_SITE_OTHER): Admitting: Family Medicine

## 2024-02-16 VITALS — BP 104/58 | HR 81 | Temp 98.2°F | Ht 63.0 in | Wt 201.0 lb

## 2024-02-16 DIAGNOSIS — E66812 Obesity, class 2: Secondary | ICD-10-CM | POA: Diagnosis not present

## 2024-02-16 DIAGNOSIS — D500 Iron deficiency anemia secondary to blood loss (chronic): Secondary | ICD-10-CM

## 2024-02-16 DIAGNOSIS — I69354 Hemiplegia and hemiparesis following cerebral infarction affecting left non-dominant side: Secondary | ICD-10-CM

## 2024-02-16 DIAGNOSIS — I152 Hypertension secondary to endocrine disorders: Secondary | ICD-10-CM

## 2024-02-16 DIAGNOSIS — E1159 Type 2 diabetes mellitus with other circulatory complications: Secondary | ICD-10-CM | POA: Diagnosis not present

## 2024-02-16 DIAGNOSIS — Z6835 Body mass index (BMI) 35.0-35.9, adult: Secondary | ICD-10-CM

## 2024-02-16 MED ORDER — TIRZEPATIDE 2.5 MG/0.5ML ~~LOC~~ SOAJ: 2.5000 mg | SUBCUTANEOUS | Status: DC

## 2024-02-16 NOTE — Progress Notes (Signed)
 Subjective:  Patient ID: Shannon Erickson, female    DOB: 1975-09-04  Age: 48 y.o. MRN: [id number]  Chief Complaint  Patient presents with   Hospitalization Follow-up    HPI: Discussed the use of AI scribe software for clinical note transcription with the patient, who gave verbal consent to proceed.  History of Present Illness Shannon Erickson is a 48 year old female with a history of heavy menstrual bleeding and anemia who presents for follow-up after recent hospitalization.  Menorrhagia and anemia - Hospitalized from September 9th to September 12th for severe anemia secondary to heavy menstrual bleeding - Hemoglobin level on admission was 5.5 g/dL - Received two units of red blood cells and IV fluids during hospitalization, resulting in improved hemoglobin - Started on oral iron  tablets and has been receiving iron  transfusions - Hemoglobin increased to 8.5 g/dL as of last blood work - Menstrual cycle became irregular after initial OBGYN prescription of 10 mg blood control pill, later reduced to 5 mg, which led to significant blood loss - Provera dosage increased to 20 mg daily during hospitalization, resulting in cessation of menstrual cycle - Scheduled for follow-up with gynecologist - No current symptoms of shortness of breath, chest pain, abdominal pain, or leg swelling - During recent episode of anemia, experienced exhaustion and generalized aches, initially attributed to a flu-like illness - Echocardiogram performed during hospitalization showed normal cardiac function  Anticoagulation therapy - Switched from Coumadin  to Eliquis  by Dr. Ezzard  Diabetes mellitus - Diabetes is well-controlled on Farxiga  - Blood glucose monitored daily during hospitalization and remained stable - Last hemoglobin A1c checked on June 30th was 6.0.  Diet and weight management - Avoids red meat, pork, processed meats, greasy foods, and sweets - Diet consists primarily of chicken,  fresh fruits, and vegetables - Does not consume sodas - Gradual weight gain noted - Interested in losing 30-40 pounds - Previous weight was 178 pounds prior to stroke       02/13/2024   10:22 AM 02/09/2024    4:00 PM 02/08/2024   11:59 AM 01/30/2024    3:00 PM 11/28/2023    2:00 PM  Depression screen PHQ 2/9  Decreased Interest 0 0 0 0 1  Down, Depressed, Hopeless 0 0 0 0 1  PHQ - 2 Score 0 0 0 0 2  Altered sleeping  0 0 0 2  Tired, decreased energy  0 0 0 2  Change in appetite  0 0 0 1  Feeling bad or failure about yourself   0 0 0 0  Trouble concentrating  0 0 0 0  Moving slowly or fidgety/restless  0 0 0 3  Suicidal thoughts  0  0 0  PHQ-9 Score  0 0 0 10        02/05/2024   10:00 AM  Fall Risk   Falls in the past year? 0    Patient Care Team: Sherre Clapper, MD as PCP - General (Family Medicine) Bernie Guillermina BROCKS, MD (Inactive) as Consulting Physician (Internal Medicine) [facility] Od, Georgia McCurdy, Shona LABOR, RN as Registered Nurse   Review of Systems  Constitutional:  Negative for chills, fatigue and fever.  HENT:  Negative for congestion, ear pain and sore throat.   Respiratory:  Negative for cough and shortness of breath.   Cardiovascular:  Negative for chest pain.  Gastrointestinal:  Negative for abdominal pain, constipation, diarrhea, nausea and vomiting.  Psychiatric/Behavioral:  Negative for dysphoric mood. The patient  is not nervous/anxious.     Current Outpatient Medications on File Prior to Visit  Medication Sig Dispense Refill   apixaban  (ELIQUIS ) 5 MG TABS tablet Take 1 tablet (5 mg total) by mouth 2 (two) times daily. 60 tablet 1   atorvastatin  (LIPITOR ) 80 MG tablet Take 1 tablet by mouth once daily 90 tablet 0   FARXIGA  10 MG TABS tablet Take 1 tablet (10 mg total) by mouth daily before breakfast. 90 tablet 0   ferrous sulfate 325 (65 FE) MG EC tablet Take 325 mg by mouth 2 (two) times daily.     levETIRAcetam  (KEPPRA ) 750 MG tablet Take 1 tablet  (750 mg total) by mouth 2 (two) times daily. 180 tablet 0   medroxyPROGESTERone (PROVERA) 10 MG tablet Take 10 mg by mouth 2 (two) times daily.     nitroGLYCERIN  (NITROSTAT ) 0.4 MG SL tablet Place 1 tablet under the tongue every 5 (five) minutes as needed.  0   No current facility-administered medications on file prior to visit.   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 ENDOSCOPY;  Service: Cardiovascular;  Laterality: N/A;    Family History  Problem Relation Age of Onset   Lung cancer Mother        mets to brain, bone, liver   Hypertension Father    Heart attack Brother        at age 7   Renal cancer Maternal Grandfather    Heart attack Paternal Grandfather    Social History   Socioeconomic History   Marital status: Single    Spouse name: Not on file   Number of children: 3   Years of education: 12   Highest education level: GED or equivalent  Occupational History   Not on file  Tobacco Use   Smoking status: Never   Smokeless tobacco: Never  Vaping Use   Vaping status: Never Used  Substance and Sexual Activity   Alcohol use: No   Drug use: No   Sexual activity: Yes    Partners: Male    Birth control/protection: Surgical  Other Topics Concern   Not on file  Social History Narrative   Not on file   Social Drivers of Health   Financial Resource Strain: Medium Risk (03/06/2023)   Overall Financial Resource Strain (CARDIA)    Difficulty of Paying Living Expenses: Somewhat hard  Food  Insecurity: No Food Insecurity (02/05/2024)   Hunger Vital Sign    Worried About Running Out of Food in the Last Year: Never true    Ran Out of Food in the Last Year: Never true  Transportation Needs: No Transportation Needs (02/05/2024)   PRAPARE - Administrator, Civil Service (Medical): No    Lack of Transportation (Non-Medical): No  Physical Activity: Insufficiently Active (03/06/2023)   Exercise Vital Sign    Days of Exercise per Week: 5 days    Minutes of Exercise per Session: 10 min  Stress: No Stress Concern Present (03/06/2023)   [employer] of Occupational Health - Occupational Stress Questionnaire    Feeling of Stress : Only a little  Social Connections: Moderately Integrated (03/06/2023)   Social Connection and Isolation Panel    Frequency of Communication with Friends and Family: More than three times a week    Frequency of Social Gatherings with Friends and Family: Three times a week    Attends Religious Services: More than 4 times per year    Active Member of Clubs or Organizations: Yes    Attends Banker Meetings: 1 to 4 times per year    Marital Status: Divorced  Recent Concern: Social Connections - Moderately Isolated (01/04/2023)   Social Connection and Isolation Panel    Frequency of Communication with Friends and Family: More than three times a week    Frequency of Social Gatherings with Friends and Family: More than three times a week    Attends Religious Services: More than 4 times per year    Active Member of [employer] or Organizations: No    Attends Engineer, structural: Never    Marital Status: Never married    Objective:  BP (!) 104/58   Pulse 81   Temp 98.2 F (36.8 C)   Ht 5' 3 (1.6 m)   Wt 201 lb (91.2 kg)   LMP 01/30/2024   SpO2 97%   BMI 35.61 kg/m      02/16/2024   11:04 AM 02/15/2024    3:58 PM 02/15/2024    3:22 PM  BP/Weight  Systolic BP 104 102 131  Diastolic BP 58 64 67  Wt. (Lbs) 201    BMI  35.61 kg/m2      Physical Exam Vitals reviewed.  Constitutional:      Appearance: Normal appearance. She is obese.  Cardiovascular:     Rate and Rhythm: Normal rate and regular rhythm.     Heart sounds: Normal heart sounds.  Pulmonary:     Effort: Pulmonary effort is normal. No respiratory distress.     Breath sounds: Normal breath sounds.  Abdominal:     General: Abdomen is flat. Bowel sounds are normal.     Palpations: Abdomen is soft.     Tenderness: There is no abdominal tenderness.  Neurological:     Mental Status: She is alert and oriented to person, place, and time.  Psychiatric:        Mood and Affect: Mood normal.        Behavior: Behavior normal.         Lab Results  Component Value Date   WBC 6.3 02/09/2024   HGB 8.5 (L) 02/09/2024   HCT 29.2 (L) 02/09/2024   PLT 264 02/09/2024   GLUCOSE 99 11/20/2023   CHOL 146 11/20/2023   TRIG 117 11/20/2023   HDL 52 11/20/2023   LDLCALC 73 11/20/2023   ALT 68 (H) 11/20/2023   AST 49 (H) 11/20/2023   NA 139 11/20/2023   K 4.2 11/20/2023   CL 104 11/20/2023   CREATININE 0.80 11/20/2023   BUN 12 11/20/2023   CO2 21 11/20/2023   TSH 1.030 11/20/2023   INR 1.0 02/09/2024   HGBA1C 6.0 (H) 11/20/2023    Results for orders placed or performed in visit on 02/09/24  CBC with Differential (Cancer Center Only)   Collection Time: 02/09/24  3:43 PM  Result Value Ref Range   WBC Count 6.3 4.0 - 10.5 K/uL   RBC 3.13 (  L) 3.87 - 5.11 MIL/uL   Hemoglobin 8.5 (L) 12.0 - 15.0 g/dL   HCT 70.7 (L) 63.9 - 53.9 %   MCV 93.3 80.0 - 100.0 fL   MCH 27.2 26.0 - 34.0 pg   MCHC 29.1 (L) 30.0 - 36.0 g/dL   RDW 82.7 (H) 88.4 - 84.4 %   Platelet Count 264 150 - 400 K/uL   nRBC 0.0 0.0 - 0.2 %   Neutrophils Relative % 62 %   Neutro Abs 4.0 1.7 - 7.7 K/uL   Lymphocytes Relative 25 %   Lymphs Abs 1.6 0.7 - 4.0 K/uL   Monocytes Relative 9 %   Monocytes Absolute 0.5 0.1 - 1.0 K/uL   Eosinophils Relative 2 %   Eosinophils Absolute 0.1  0.0 - 0.5 K/uL   Basophils Relative 1 %   Basophils Absolute 0.0 0.0 - 0.1 K/uL   Immature Granulocytes 1 %   Abs Immature Granulocytes 0.03 0.00 - 0.07 K/uL  .  Assessment & Plan:   Assessment & Plan Iron  deficiency anemia secondary to blood loss (chronic) Iron  deficiency anemia from heavy menstrual bleeding. Hemoglobin improved post-transfusion and iron  supplementation. Menstrual bleeding controlled with Provera. Transvaginal ultrasound normal. - Continue iron  supplementation and monitor hemoglobin. - Continue Provera 20 mg twice daily until gynecologist follow-up. - Follow up with gynecology for further evaluation.    Hypertension associated with diabetes (HCC) Type 2 diabetes well controlled with stable blood glucose during hospitalization. Last A1c checked June 30th. - Continue Farxiga . - Start mounjaro  2.5 mg weekly. Samples given - Follow up in 4 weeks  Hemiparesis affecting left side as late effect of cerebrovascular accident (HCC) Continue eliquis .     Class 2 severe obesity due to excess calories with serious comorbidity and body mass index (BMI) of 35.0 to 35.9 in adult Obesity with goal to lose 30-40 pounds. Healthy diet noted. Limited exercise. Discussed Mounjaro  for weight loss, insurance may require Ozempic  trial first. - Initiate Mounjaro  2.5 mg weekly.  - Follow-up in 4 weeks to assess response and adjust dosage. - Encourage low-impact exercises like water aerobics or shallow water walking. Difficulty exercising due to stroke.     Body mass index is 35.61 kg/m.     Meds ordered this encounter  Medications   tirzepatide  (MOUNJARO ) 2.5 MG/0.5ML Pen    Sig: Inject 2.5 mg into the skin once a week.    No orders of the defined types were placed in this encounter.      Follow-up: Return in about 4 weeks (around 03/15/2024) for chronic follow up with Dr. Sherre or with [facility]. .  An After Visit Summary was printed and given to the patient.  Abigail Sherre,  MD [facility] [phone]

## 2024-02-18 DIAGNOSIS — D500 Iron deficiency anemia secondary to blood loss (chronic): Secondary | ICD-10-CM | POA: Insufficient documentation

## 2024-02-18 NOTE — Assessment & Plan Note (Signed)
 Obesity with goal to lose 30-40 pounds. Healthy diet noted. Limited exercise. Discussed Mounjaro  for weight loss, insurance may require Ozempic  trial first. - Initiate Mounjaro  2.5 mg weekly.  - Follow-up in 4 weeks to assess response and adjust dosage. - Encourage low-impact exercises like water aerobics or shallow water walking. Difficulty exercising due to stroke.

## 2024-02-18 NOTE — Assessment & Plan Note (Signed)
 Continue eliquis 

## 2024-02-18 NOTE — Assessment & Plan Note (Signed)
 Type 2 diabetes well controlled with stable blood glucose during hospitalization. Last A1c checked June 30th. - Continue Farxiga . - Start mounjaro  2.5 mg weekly. Samples given - Follow up in 4 weeks

## 2024-02-18 NOTE — Assessment & Plan Note (Signed)
 Iron  deficiency anemia from heavy menstrual bleeding. Hemoglobin improved post-transfusion and iron  supplementation. Menstrual bleeding controlled with Provera. Transvaginal ultrasound normal. - Continue iron  supplementation and monitor hemoglobin. - Continue Provera 20 mg twice daily until gynecologist follow-up. - Follow up with gynecology for further evaluation.

## 2024-02-20 ENCOUNTER — Other Ambulatory Visit: Payer: Self-pay

## 2024-02-20 ENCOUNTER — Encounter: Payer: Self-pay | Admitting: Family Medicine

## 2024-02-20 NOTE — Patient Instructions (Signed)
 Visit Information  Thank you for taking time to visit with me today. Please don't hesitate to contact me if I can be of assistance to you before our next scheduled telephone appointment.  Our next appointment is by telephone on 02/28/24 in the  afternoon  Following is a copy of your care plan:   Goals Addressed             This Visit's Progress    VBCI Transitions of Care (TOC) Care Plan       Problems:  Recent Hospitalization for treatment of : Vaginal bleeding, hypotension, posthemorrhagic anemia, antiphospholipid syndrome  - patient states she was sent to hospital with Hgb 5.5 Functional/Safety concern: Patient with left foot drop and is on Eliquis  - denies history of falls - ambulates with cane when outside - resides in small apartment and uses furniture, walls, countertops inside  Goal:  Over the next 30 days, the patient will not experience hospital readmission  Interventions:  Transitions of Care: Doctor Visits  - discussed the importance of doctor visits Reviewed patient history - 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, 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   Anemia/Bleeding Interventions:  Assessment of understanding of anemia/bleeding disorder diagnosis  Basic overview and discussion of anemia/bleeding disorder or acute disease state  Medications reviewed  Counseled on  bleeding risk associated with Eliquis  and importance of self-monitoring for signs/symptoms of bleeding Counseled on avoidance of NSAIDs due to increased bleeding risk Advised to call provider or 911 if active bleeding or signs and symptoms of active bleeding occur Assessed social determinant of health barriers  Update 02/13/24: Patient states she is feeling better, denies any further vaginal bleeding-saw hematologist, Dr Kerns 02/09/24 and Hgb was 8.5 up from 5.5 on 01/30/24 - Patient has iron  infusions scheduled for 9/24, 9/25, 10/1, 10/3, 02/27/24 and follow up with Dr Kerns with labs 03/04/24 - Patient denied any new issues or concerns - Patient denied any depression/anxiety and states she is still not using Lexapro  as she does not feel it's needed.   Update 02/19/24: Patient states she continues to feel well and continues to deny further vaginal bleeding and had iron  infusions as planned 9/24, 9/25 and her next infusions are 10/1, 10/3, 02/27/24 and follow up with Dr Kerns with labs 03/04/24 - TOC RN reviewed patient's 02/16/24 appt with her PCP, Dr Sherre patient was started on Mounjaro  and states she received a 30 day sample from provider and will follow up in 4 weeks to reassess. Patient states her goal is to lose 30-40lbs and states they did not state a specific time frame and states she is apprehensive about water exercise related to history of stroke and residual - Patient states she read in her chart something from a cardiac ultrasound that said abnormal TOC RN reviewed records and advised patient that the notes reflect sinus  tachycardia and hypotension in the ED that was likely due to anemia and shows this resolved prior to discharge and for more information she could message her PCP - patient states she will send message   Lab Results  Component Value Date   WBC 12.5 (H) 01/30/2024   HGB 5.5 (LL) 01/30/2024   HCT 18.5 (L) 01/30/2024   MCV 91.6 01/30/2024   PLT 254 01/30/2024    Patient Self Care  Activities:  Attend all scheduled provider appointments Call pharmacy for medication refills 3-7 days in advance of running out of medications Call provider office for new concerns or questions  Notify RN Care Manager of TOC call rescheduling needs Participate in Transition of Care Program/Attend TOC scheduled calls Take medications as prescribed   Notify RN or MD with any signs of bleeding, increased weakness, any fall   Plan:  Telephone follow up appointment with care management team member scheduled for:  02/28/24 in the afternoon The patient has been provided with contact information for the care management team and has been advised to call with any health related questions or concerns.         Patient verbalizes understanding of instructions and care plan provided today and agrees to view in MyChart. Active MyChart status and patient understanding of how to access instructions and care plan via MyChart confirmed with patient.     Telephone follow up appointment with care management team member scheduled for:02/28/24 The patient has been provided with contact information for the care management team and has been advised to call with any health related questions or concerns.   Please call the care guide team at [phone] if you need to cancel or reschedule your appointment.   Please call the Suicide and Crisis Lifeline: 988 call 1-800-273-TALK (toll free, 24 hour hotline) call 911 if you are experiencing a Mental Health or Behavioral Health Crisis or need someone to talk to.  Shona Prow RN, CCM [place]  VBCI-Population Health RN Care Manager [phone]

## 2024-02-20 NOTE — Transitions of Care (Post Inpatient/ED Visit) (Signed)
  Transition of Care week 3  Visit Note  02/20/2024  Name: Shannon Erickson MRN: [id number]          DOB: May 15, 1976  Situation: Patient enrolled in [facility] 30-day program. Visit completed with patient by telephone.   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    Assessment: Patient Reported Symptoms: Cognitive Cognitive Status: No symptoms reported      Neurological Neurological Review of Symptoms: Other: Oher Neurological Symptoms/Conditions [RPT]: History of seizure and strong - onling residual but feels safe and uses caution Neurological Management Strategies: Medication therapy  HEENT HEENT Symptoms Reported: No symptoms reported      Cardiovascular Cardiovascular Symptoms Reported: Not assessed    Respiratory Respiratory Symptoms Reported: No symptoms reported    Endocrine Endocrine Symptoms Reported: No symptoms reported    Gastrointestinal Gastrointestinal Symptoms Reported: No symptoms reported      Genitourinary Genitourinary Symptoms Reported: No symptoms reported Additional Genitourinary Details: patient denies any vaginal bleeding    Integumentary Integumentary Symptoms Reported: No symptoms reported    Musculoskeletal          Psychosocial Psychosocial Symptoms Reported: Not assessed         There were no vitals filed for this visit.  Medications Reviewed Today     Reviewed by [name], RN (Registered Nurse) on 02/20/24 at 1139  Med List Status: <None>   Medication Order Taking? Sig Documenting  Provider Last Dose Status Informant  apixaban  (ELIQUIS ) 5 MG TABS tablet [id number] Yes Take 1 tablet (5 mg total) by mouth 2 (two) times daily. Mosher, Kelli A, PA-C  Active   atorvastatin  (LIPITOR ) 80 MG tablet [id number] Yes Take 1 tablet by mouth once daily Cox, Kirsten, MD  Active   FARXIGA  10 MG TABS tablet [id number] Yes Take 1 tablet (10 mg total) by mouth daily before breakfast. Cox, Kirsten, MD  Active   ferrous sulfate 325 (65 FE) MG EC tablet [id number] Yes Take 325 mg by mouth 2 (two) times daily. [provider]  Active   levETIRAcetam  (KEPPRA ) 750 MG tablet [id number] Yes Take 1 tablet (750 mg total) by mouth 2 (two) times daily. Cox, Kirsten, MD  Active   medroxyPROGESTERone (PROVERA) 10 MG tablet [id number] Yes Take 10 mg by mouth 2 (two) times daily. [provider]  Active   nitroGLYCERIN  (NITROSTAT ) 0.4 MG SL tablet [id number]  Place 1 tablet under the tongue every 5 (five) minutes as needed.  Patient not taking: Reported on 02/20/2024   [provider]  Active   tirzepatide  (MOUNJARO ) 2.5 MG/0.5ML Pen [id number] Yes Inject 2.5 mg into the skin once a week. Sherre Clapper, MD  Active             Recommendation:   Continue Current Plan of Care  Follow Up Plan:   Telephone follow up appointment date/time:  02/28/24 in the afternoon  Shona Lauro RN, CCM [place]  VBCI-Population Health RN Care Manager [phone]

## 2024-02-21 ENCOUNTER — Inpatient Hospital Stay: Attending: Oncology

## 2024-02-21 ENCOUNTER — Ambulatory Visit: Admitting: Family Medicine

## 2024-02-21 VITALS — BP 108/68 | HR 80 | Temp 98.4°F | Resp 18 | Ht 63.0 in

## 2024-02-21 DIAGNOSIS — D509 Iron deficiency anemia, unspecified: Secondary | ICD-10-CM | POA: Insufficient documentation

## 2024-02-21 DIAGNOSIS — Z7901 Long term (current) use of anticoagulants: Secondary | ICD-10-CM | POA: Diagnosis not present

## 2024-02-21 DIAGNOSIS — Z8673 Personal history of transient ischemic attack (TIA), and cerebral infarction without residual deficits: Secondary | ICD-10-CM | POA: Diagnosis not present

## 2024-02-21 MED ORDER — IRON SUCROSE 20 MG/ML IV SOLN
200.0000 mg | Freq: Once | INTRAVENOUS | Status: AC
  Administered 2024-02-21: 200 mg via INTRAVENOUS
  Filled 2024-02-21: qty 10

## 2024-02-21 NOTE — Patient Instructions (Signed)

## 2024-02-23 ENCOUNTER — Inpatient Hospital Stay

## 2024-02-23 VITALS — BP 109/68 | HR 81 | Temp 98.6°F | Resp 18

## 2024-02-23 DIAGNOSIS — D509 Iron deficiency anemia, unspecified: Secondary | ICD-10-CM | POA: Diagnosis not present

## 2024-02-23 MED ORDER — IRON SUCROSE 20 MG/ML IV SOLN
200.0000 mg | Freq: Once | INTRAVENOUS | Status: AC
  Administered 2024-02-23: 200 mg via INTRAVENOUS
  Filled 2024-02-23: qty 10

## 2024-02-23 NOTE — Patient Instructions (Signed)

## 2024-02-27 ENCOUNTER — Encounter: Payer: Self-pay | Admitting: Oncology

## 2024-02-27 ENCOUNTER — Inpatient Hospital Stay

## 2024-02-27 VITALS — BP 106/66 | HR 81 | Temp 98.0°F | Resp 18

## 2024-02-27 DIAGNOSIS — D509 Iron deficiency anemia, unspecified: Secondary | ICD-10-CM

## 2024-02-27 MED ORDER — IRON SUCROSE 20 MG/ML IV SOLN
200.0000 mg | Freq: Once | INTRAVENOUS | Status: AC
  Administered 2024-02-27: 200 mg via INTRAVENOUS
  Filled 2024-02-27: qty 10

## 2024-02-27 NOTE — Patient Instructions (Signed)

## 2024-02-28 ENCOUNTER — Telehealth: Payer: Self-pay

## 2024-02-28 NOTE — Transitions of Care (Post Inpatient/ED Visit) (Signed)
 Transition of Care week 4  Visit Note  02/28/2024  Name: Shannon Erickson MRN: [id number]          DOB: May 28, 1975  Situation: Patient enrolled in [facility] 30-day program. Visit completed with patient by telephone.   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    Assessment: Patient Reported Symptoms: Cognitive Cognitive Status: No symptoms reported, Alert and oriented to person, place, and time, Normal speech and language skills      Neurological Neurological Review of Symptoms: Other: Oher Neurological Symptoms/Conditions [RPT]: Patient has a history seizure and stroke -left side arm/hand residual effects - left leg weaker than right Neurological Management Strategies: Medication therapy Neurological Self-Management Outcome: 3 (uncertain) Neurological Comment: 2018 stroke followed by seizure activity in 2019 - patient denies any since  HEENT HEENT Symptoms Reported: No symptoms reported      Cardiovascular Cardiovascular Symptoms Reported: No symptoms reported (patient denies any sweeling today)    Respiratory Respiratory Symptoms Reported: No symptoms reported    Endocrine Endocrine Symptoms Reported: No symptoms reported    Gastrointestinal Gastrointestinal Symptoms Reported: No symptoms reported      Genitourinary Genitourinary Symptoms Reported: No symptoms reported Additional Genitourinary Details: patient denies any issues - no further vaginal bleed    Integumentary Integumentary Symptoms Reported: No symptoms reported    Musculoskeletal  Musculoskelatal Symptoms Reviewed: Weakness Other Musculoskeletal Symptoms: left leg weakness with left foot drop - wears brace  - denies fall        Psychosocial Psychosocial Symptoms Reported: No symptoms reported Additional Psychological Details: patient denies depression /anxiety         There were no vitals filed for this visit.  Medications Reviewed Today     Reviewed by [name], RN (Registered Nurse) on 02/28/24 at 1333  Med List Status: <None>   Medication Order Taking? Sig Documenting Provider Last Dose Status Informant  apixaban  (ELIQUIS ) 5 MG TABS tablet [id number] Yes Take 1 tablet (5 mg total) by mouth 2 (two) times daily. Mosher, Andrez LABOR, PA-C  Active   atorvastatin  (LIPITOR ) 80 MG tablet [id number] Yes Take 1 tablet by mouth once daily Cox, Kirsten, MD  Active   FARXIGA  10 MG TABS tablet [id number] Yes Take 1 tablet (10 mg total) by mouth daily before breakfast. Cox, Kirsten, MD  Active   ferrous sulfate 325 (65 FE) MG EC tablet [id number] Yes Take 325 mg by mouth 2 (two) times daily. [provider]  Active   levETIRAcetam  (KEPPRA ) 750 MG tablet [id number] Yes Take 1 tablet (750 mg total) by mouth 2 (two) times daily. Cox, Kirsten, MD  Active   medroxyPROGESTERone (PROVERA) 10 MG tablet [id number] Yes Take 10 mg by mouth 2 (two) times daily. [provider]  Active   nitroGLYCERIN  (NITROSTAT ) 0.4 MG SL tablet [id number]  Place 1 tablet under the tongue every 5 (five) minutes as needed.  Patient not taking: Reported on 02/28/2024   [provider]  Active   tirzepatide  (MOUNJARO ) 2.5 MG/0.5ML Pen [id number] Yes Inject 2.5 mg into the skin once a  week. Sherre Clapper, MD  Active             Recommendation:   Continue Current Plan of Care  Follow Up Plan:   Telephone follow up appointment date/time:  03/06/24 in the afternoon  Shona Prow RN, CCM [place]  VBCI-Population Health RN Care Manager [phone]

## 2024-02-28 NOTE — Patient Instructions (Signed)
 Visit Information  Thank you for taking time to visit with me today. Please don't hesitate to contact me if I can be of assistance to you before our next scheduled telephone appointment.  Our next appointment is by telephone on 03/06/24 in the afternoon    Following is a copy of your care plan:   Goals Addressed             This Visit's Progress    VBCI Transitions of Care (TOC) Care Plan       Problems:  Recent Hospitalization for treatment of : Vaginal bleeding, hypotension, posthemorrhagic anemia, antiphospholipid syndrome  - patient states she was sent to hospital with Hgb 5.5 Functional/Safety concern: Patient with left foot drop and is on Eliquis  - denies history of falls - ambulates with cane when outside - resides in small apartment and uses furniture, walls, countertops inside  Goal:  Over the next 30 days, the patient will not experience hospital readmission  Interventions:  Transitions of Care: Doctor Visits  - discussed the importance of doctor visits Reviewed patient history - 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, 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   Anemia/Bleeding Interventions:  Assessment of understanding of anemia/bleeding disorder diagnosis  Basic overview and discussion of anemia/bleeding disorder or acute disease state  Medications reviewed  Counseled on  bleeding risk associated with Eliquis  and importance of self-monitoring for signs/symptoms of bleeding Counseled on avoidance of NSAIDs due to increased bleeding risk Advised to call provider or 911 if active bleeding or signs and symptoms of active bleeding occur Assessed social determinant of health barriers  Update 02/13/24: Patient states she is feeling better, denies any further vaginal bleeding-saw hematologist, Dr Kerns 02/09/24 and Hgb was 8.5 up from 5.5 on 01/30/24 - Patient has iron  infusions scheduled for 9/24, 9/25, 10/1, 10/3, 02/27/24 and follow up with Dr Kerns with labs 03/04/24 - Patient denied any new issues or concerns - Patient denied any depression/anxiety and states she is still not using Lexapro  as she does not feel it's needed.   Update 02/19/24: Patient states she continues to feel well and continues to deny further vaginal bleeding and had iron  infusions as planned 9/24, 9/25 and her next infusions are 10/1, 10/3, 02/27/24 and follow up with Dr Kerns with labs 03/04/24 - TOC RN reviewed patient's 02/16/24 appt with her PCP, Dr Sherre patient was started on Mounjaro  and states she received a 30 day sample from provider and will follow up in 4 weeks to reassess. Patient states her goal is to lose 30-40lbs and states they did not state a specific time frame and states she is apprehensive about water exercise related to history of stroke and residual - Patient states she read in her chart something from a cardiac ultrasound that said abnormal TOC RN reviewed records and advised patient that the notes reflect  sinus tachycardia and hypotension in the ED that was likely due to anemia and shows this resolved prior to discharge and for more information she could message her PCP - patient states she will send message   Update 02/28/24: patient states she had iron  infusions as planned and will have lobs and MD appt 10/13 Dr Valaria Kerns, hematology and is awaiting this and hoping for the best, no  further vaginal bleeding, patient states she feels she is getting closer to her normal state of health and states the unknown about the blood work worries her still but denies depression/anxiety - eager for appt   Lab Results  Component Value Date   WBC 12.5 (H) 01/30/2024   HGB 5.5 (LL) 01/30/2024   HCT 18.5 (L) 01/30/2024   MCV 91.6 01/30/2024   PLT 254 01/30/2024    Patient Self Care Activities:  Attend all scheduled provider appointments Call pharmacy for medication refills 3-7 days in advance of running out of medications Call provider office for new concerns or questions  Notify RN Care Manager of TOC call rescheduling needs Participate in Transition of Care Program/Attend TOC scheduled calls Take medications as prescribed   Notify RN or MD with any signs of bleeding, increased weakness, any fall   Plan:  Telephone follow up appointment with care management team member scheduled for:  03/06/24 in the afternoon The patient has been provided with contact information for the care management team and has been advised to call with any health related questions or concerns.         Patient verbalizes understanding of instructions and care plan provided today and agrees to view in MyChart. Active MyChart status and patient understanding of how to access instructions and care plan via MyChart confirmed with patient.     Telephone follow up appointment with care management team member scheduled for: 03/06/24  The patient has been provided with contact information for the care management team and has been advised to call with any health related questions or concerns.   Please call the care guide team at [phone] if you need to cancel or reschedule your appointment.   Please call the Suicide and Crisis Lifeline: 988 call 911 if you are experiencing a Mental Health or Behavioral Health Crisis or need someone to talk to.  Shona Prow RN, CCM [place]  VBCI-Population  Health RN Care Manager [phone]

## 2024-03-03 NOTE — Progress Notes (Deleted)
 [facility]  [address] [place],  [place]  [address] [phone]  Clinic Day:  02/09/2024  Referring physician: Sherre Clapper, MD   HISTORY OF PRESENT ILLNESS:  The patient is a 48 y.o. female with antiphospholipid syndrome.  This was diagnosed after having an embolic stroke in 2018.  On 3 separate occasions, her IgG beta-2  glycoprotein antibody level has been very elevated.  Based upon this, she is on chronic anticoagulation.  Due to the wildly fluctuating levels she had with her INR while on warfarin, the patient has since switched to Eliquis  5 mg twice daily.    However, the most pressing issue with this patient recently has been recurrent bouts of iron  deficiency anemia.  She was just given IV iron  in late September/early October 2025.    The patient was just admitted to the hospital last week after being transferred from our facility.  Labs at that time showed a severely low hemoglobin of only 5.5.  In addition to being given multiple units of blood, gynecology saw her and has increased her Provera back up to 10 mg twice daily.  Since this has been done, the patient's extremely heavy menstrual cycles have been brought under much better control.  Of note, this patient last received IV iron  in July 2025.  She comes into clinic feeling much better today.  She hopes that the increase in her Provera to 10 mg twice a day will significantly bring down the heaviness of her menstrual cycles to where severe iron  deficiency anemia will likely not recur.  PHYSICAL EXAM:  Last menstrual period 01/30/2024. Wt Readings from Last 3 Encounters:  02/16/24 201 lb (91.2 kg)  02/09/24 205 lb 12.8 oz (93.4 kg)  01/30/24 205 lb 8 oz (93.2 kg)   There is no height or weight on file to calculate BMI. Performance status (ECOG): 1 - Symptomatic but completely ambulatory Physical Exam Constitutional:      Appearance: She is not ill-appearing.  HENT:     Mouth/Throat:      Mouth: Mucous membranes are moist.     Pharynx: Oropharynx is clear. No oropharyngeal exudate or posterior oropharyngeal erythema.  Cardiovascular:     Rate and Rhythm: Normal rate and regular rhythm.     Heart sounds: No murmur heard.    No friction rub. No gallop.  Pulmonary:     Effort: Pulmonary effort is normal. No respiratory distress.     Breath sounds: Normal breath sounds. No wheezing, rhonchi or rales.  Abdominal:     General: Bowel sounds are normal. There is no distension.     Palpations: Abdomen is soft. There is no mass.     Tenderness: There is no abdominal tenderness.  Musculoskeletal:        General: No swelling.     Right lower leg: No edema.     Left lower leg: No edema.  Lymphadenopathy:     Cervical: No cervical adenopathy.     Upper Body:     Right upper body: No supraclavicular or axillary adenopathy.     Left upper body: No supraclavicular or axillary adenopathy.     Lower Body: No right inguinal adenopathy. No left inguinal adenopathy.  Skin:    General: Skin is warm.     Coloration: Skin is not jaundiced.     Findings: No lesion or rash.  Neurological:     General: No focal deficit present.     Mental Status: She is alert  and oriented to person, place, and time. Mental status is at baseline.     Motor: No weakness (Left arm).  Psychiatric:        Mood and Affect: Mood normal.        Behavior: Behavior normal.        Thought Content: Thought content normal.    LABS:      Latest Ref Rng & Units 02/09/2024    3:43 PM 01/30/2024    1:15 PM 11/28/2023    1:36 PM  CBC  WBC 4.0 - 10.5 K/uL 6.3  12.5  6.1   Hemoglobin 12.0 - 15.0 g/dL 8.5  5.5  8.5   Hematocrit 36.0 - 46.0 % 29.2  18.5  27.1   Platelets 150 - 400 K/uL 264  254  239       Latest Ref Rng & Units 11/20/2023    3:11 PM 08/29/2023    1:06 PM 06/16/2023   11:05 AM  CMP  Glucose 70 - 99 mg/dL 99  837  93   BUN 6 - 24 mg/dL 12  9  13    Creatinine 0.57 - 1.00 mg/dL 9.19  9.16  9.17   Sodium  134 - 144 mmol/L 139  138  138   Potassium 3.5 - 5.2 mmol/L 4.2  3.6  3.9   Chloride 96 - 106 mmol/L 104  103  105   CO2 20 - 29 mmol/L 21  22  20    Calcium  8.7 - 10.2 mg/dL 8.6  8.2  7.9   Total Protein 6.0 - 8.5 g/dL 6.8  6.1  6.6   Total Bilirubin 0.0 - 1.2 mg/dL 0.5  0.5  0.6   Alkaline Phos 44 - 121 IU/L 159  182  199   AST 0 - 40 IU/L 49  80  116   ALT 0 - 32 IU/L 68  103  178     Latest Reference Range & Units 12/04/16 13:16 02/22/17 12:42 08/01/22 15:43  Beta-2  Glycoprotein I Ab, IgG 0 - 20 GPI IgG units 109 (H) >150 (H) 127 (H)  (H): Data is abnormally high   Latest Reference Range & Units 01/30/24 13:15  Iron  28 - 170 ug/dL 20 (L)  UIBC ug/dL 627  TIBC 749 - 549 ug/dL 607  Saturation Ratios 10.4 - 31.8 % 5 (L)  Ferritin 11 - 307 ng/mL 28  (L): Data is abnormally low   ASSESSMENT & PLAN:  Assessment/Plan:  A 48 y.o. female with antiphospholipid syndrome, which was discovered per a workup for an embolic stroke in 2018.  As this disorder is very hypercoagulable, she is on Eliquis  5 mg twice daily, which she will take indefinitely.   With respect to her iron  deficiency anemia, I am pleased that her hemoglobin of 8.5 is better than it was last week.  As recent iron  parameters showed her to be iron  deficient, I will also arrange for her to receive IV iron  over these next few weeks to further replenish her iron  stores and improve her hemoglobin.  She knows to continue to work with her gynecologist with respect to adjusting her oral contraceptive therapy to where it will significantly curtail the heaviness of her menstrual cycles.  Otherwise, I will see this patient back in 3 months to reassess her iron  and hemoglobin levels to see how well she responded to her upcoming course of IV iron . The patient understands all the plans discussed today and is in agreement with them.  Nowell Sites DELENA Kerns, MD

## 2024-03-04 ENCOUNTER — Inpatient Hospital Stay: Admitting: Oncology

## 2024-03-04 ENCOUNTER — Inpatient Hospital Stay

## 2024-03-04 DIAGNOSIS — D509 Iron deficiency anemia, unspecified: Secondary | ICD-10-CM | POA: Diagnosis not present

## 2024-03-04 DIAGNOSIS — D500 Iron deficiency anemia secondary to blood loss (chronic): Secondary | ICD-10-CM

## 2024-03-04 LAB — PROTIME-INR
INR: 1 (ref 0.8–1.2)
Prothrombin Time: 14.1 s (ref 11.4–15.2)

## 2024-03-05 ENCOUNTER — Encounter: Payer: Self-pay | Admitting: Oncology

## 2024-03-05 ENCOUNTER — Telehealth: Payer: Self-pay

## 2024-03-05 NOTE — Telephone Encounter (Signed)
 Patient is calling requesting her lab results from yesterday 03/04/24. Patient also wanted to know if we did a HGB or just PT/INR. Patient would like a call back regarding her lab results.

## 2024-03-06 ENCOUNTER — Telehealth: Payer: Self-pay

## 2024-03-06 DIAGNOSIS — D62 Acute posthemorrhagic anemia: Secondary | ICD-10-CM

## 2024-03-06 NOTE — Transitions of Care (Post Inpatient/ED Visit) (Signed)
 Transition of Care Week #5  Visit Note  03/06/2024  Name: Shannon Erickson MRN: [id number]          DOB: 1975-08-14  Situation: Patient enrolled in [facility] 30-day program. Visit completed with patient by telephone.   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    Assessment: Patient Reported Symptoms: Cognitive Cognitive Status: No symptoms reported, Normal speech and language skills, Alert and oriented to person, place, and time      Neurological [place] Neurological Symptoms/Conditions [RPT]: Patient has a history seizure and stroke -left side arm/hand residual effects - left leg weaker than right Neurological Management Strategies: Medication therapy  HEENT HEENT Symptoms Reported: No symptoms reported      Cardiovascular Cardiovascular Symptoms Reported: No symptoms reported Cardiovascular Comment: Patient states swelling left leg is improved  Respiratory Respiratory Symptoms Reported: No symptoms reported    Endocrine Endocrine Symptoms Reported: No symptoms reported    Gastrointestinal Gastrointestinal Symptoms Reported: No symptoms reported      Genitourinary Genitourinary Symptoms Reported: No symptoms reported Additional Genitourinary Details: patient denies any vaginal bleeding since hospital discharge    Integumentary Integumentary Symptoms Reported: No symptoms reported    Musculoskeletal Musculoskelatal Symptoms Reviewed:  Other Other Musculoskeletal Symptoms: patient has left foot drop - wears brace - denies fall        Psychosocial Psychosocial Symptoms Reported: No symptoms reported         There were no vitals filed for this visit.  Medications Reviewed Today     Reviewed by [name], RN (Registered Nurse) on 03/06/24 at 1335  Med List Status: <None>   Medication Order Taking? Sig Documenting Provider Last Dose Status Informant  apixaban  (ELIQUIS ) 5 MG TABS tablet [id number] Yes Take 1 tablet (5 mg total) by mouth 2 (two) times daily. Mosher, Andrez LABOR, PA-C  Active   atorvastatin  (LIPITOR ) 80 MG tablet [id number] Yes Take 1 tablet by mouth once daily Cox, Kirsten, MD  Active   FARXIGA  10 MG TABS tablet [id number] Yes Take 1 tablet (10 mg total) by mouth daily before breakfast. Cox, Kirsten, MD  Active   ferrous sulfate 325 (65 FE) MG EC tablet [id number] Yes Take 325 mg by mouth 2 (two) times daily. [provider]  Active   levETIRAcetam  (KEPPRA ) 750 MG tablet [id number] Yes Take 1 tablet (750 mg total) by mouth 2 (two) times daily. Cox, Kirsten, MD  Active   medroxyPROGESTERone (PROVERA) 10 MG tablet [id number] Yes Take 10 mg by mouth 2 (two) times daily. [provider]  Active   nitroGLYCERIN  (NITROSTAT ) 0.4 MG SL tablet [id number]  Place 1 tablet under the tongue every 5 (five) minutes as needed.  Patient not taking: Reported on 03/06/2024   [provider]  Active   tirzepatide  (MOUNJARO ) 2.5 MG/0.5ML Pen [id number] Yes Inject 2.5 mg into the skin once a week. Sherre Clapper, MD  Active             Recommendation:   Referral  to: CCM  Follow Up Plan:   Closing From:  Transitions of Care Program  Shona Prow RN, CCM [facility]  VBCI-Population Health RN Care Manager [phone]

## 2024-03-08 ENCOUNTER — Other Ambulatory Visit: Payer: Self-pay | Admitting: Oncology

## 2024-03-12 ENCOUNTER — Ambulatory Visit: Admitting: Oncology

## 2024-03-12 ENCOUNTER — Other Ambulatory Visit

## 2024-03-14 ENCOUNTER — Other Ambulatory Visit (HOSPITAL_BASED_OUTPATIENT_CLINIC_OR_DEPARTMENT_OTHER): Payer: Self-pay

## 2024-03-14 ENCOUNTER — Ambulatory Visit: Admitting: Family Medicine

## 2024-03-14 ENCOUNTER — Encounter: Payer: Self-pay | Admitting: Family Medicine

## 2024-03-14 VITALS — BP 124/70 | HR 87 | Temp 98.0°F | Ht 63.0 in | Wt 199.0 lb

## 2024-03-14 DIAGNOSIS — Z6835 Body mass index (BMI) 35.0-35.9, adult: Secondary | ICD-10-CM

## 2024-03-14 DIAGNOSIS — I152 Hypertension secondary to endocrine disorders: Secondary | ICD-10-CM | POA: Diagnosis not present

## 2024-03-14 DIAGNOSIS — I69354 Hemiplegia and hemiparesis following cerebral infarction affecting left non-dominant side: Secondary | ICD-10-CM

## 2024-03-14 DIAGNOSIS — E1159 Type 2 diabetes mellitus with other circulatory complications: Secondary | ICD-10-CM | POA: Diagnosis not present

## 2024-03-14 DIAGNOSIS — E66812 Obesity, class 2: Secondary | ICD-10-CM

## 2024-03-14 DIAGNOSIS — R600 Localized edema: Secondary | ICD-10-CM | POA: Diagnosis not present

## 2024-03-14 DIAGNOSIS — D500 Iron deficiency anemia secondary to blood loss (chronic): Secondary | ICD-10-CM | POA: Diagnosis not present

## 2024-03-14 MED ORDER — TIRZEPATIDE 5 MG/0.5ML ~~LOC~~ SOAJ
5.0000 mg | SUBCUTANEOUS | 1 refills | Status: DC
  Filled 2024-03-14: qty 2, 28d supply, fill #0
  Filled 2024-04-07: qty 2, 28d supply, fill #1

## 2024-03-14 NOTE — Patient Instructions (Signed)
  VISIT SUMMARY: You came in today for a follow-up on weight management and medication adjustment. We discussed your progress with Mounjaro , your current energy levels.  YOUR PLAN: TYPE 2 DIABETES MELLITUS: We need to evaluate your blood sugar control. -We will check your A1c level today.  OBESITY: You are on Mounjaro  and experiencing reduced appetite and weight loss. -We will increase your dose of Mounjaro . -Continue to eat fruits and vegetables, especially green vegetables.  IRON  DEFICIENCY ANEMIA: Your energy levels have improved since your hospitalization. -We will check a complete blood count (CBC) to assess your hemoglobin levels.

## 2024-03-14 NOTE — Assessment & Plan Note (Addendum)
Continue current medications. Continue physical therapy  

## 2024-03-14 NOTE — Progress Notes (Signed)
 "  Subjective:  Patient ID: Shannon Erickson, female    DOB: 1975/10/06  Age: 48 y.o. MRN: [id number]  Chief Complaint  Patient presents with   Medical Management of Chronic Issues    HPI: Discussed the use of AI scribe software for clinical note transcription with the patient, who gave verbal consent to proceed.  History of Present Illness Shannon Erickson is a 48 year old female who presents for follow-up regarding weight management and medication adjustment.  Weight management and appetite changes - Initiated Mounjaro  approximately four weeks ago for weight management - Slight weight loss since starting medication - Significant reduction in appetite, now consuming half of usual portions - Previously ate two chicken strips from [employer], now feels full after one and does not eat French fries - Diet includes more fruits than vegetables, with a preference for raw broccoli and cabbage - Avoids potatoes due to high carbohydrate content  Hematologic status and energy levels - Last blood count on September 19th, with a previous count on September 9th - Recent blood transfusion during hospitalization - Receiving iron  transfusions - Improved energy levels since hospitalization, but not yet optimal - Curious about current hemoglobin levels  Anticoagulation therapy - Currently taking Eliquis  instead of Coumadin , resulting in less frequent INR monitoring and more stability  Cardiopulmonary symptoms - No shortness of breath - No chest pain  Peripheral edema and activity tolerance - Left foot swelling has decreased but remains more swollen than the right - Increased activity on her feet recently - No numbness in feet       03/06/2024    1:46 PM 02/27/2024    4:33 PM 02/13/2024   10:22 AM 02/09/2024    4:00 PM 02/08/2024   11:59 AM  Depression screen PHQ 2/9  Decreased Interest 0 0 0 0 0  Down, Depressed, Hopeless 0 0 0 0 0  PHQ - 2 Score 0 0 0 0 0  Altered sleeping   0  0 0  Tired, decreased energy  0  0 0  Change in appetite  0  0 0  Feeling bad or failure about yourself   0  0 0  Trouble concentrating  0  0 0  Moving slowly or fidgety/restless  0  0 0  Suicidal thoughts  0  0   PHQ-9 Score  0  0 0        02/05/2024   10:00 AM  Fall Risk   Falls in the past year? 0    Patient Care Team: Sherre Clapper, MD as PCP - General (Family Medicine) Bernie Guillermina BROCKS, MD (Inactive) as Consulting Physician (Internal Medicine) [facility] [place], [place]   Review of Systems  All other systems reviewed and are negative.   Current Outpatient Medications on File Prior to Visit  Medication Sig Dispense Refill   apixaban  (ELIQUIS ) 5 MG TABS tablet Take 1 tablet (5 mg total) by mouth 2 (two) times daily. 60 tablet 1   atorvastatin  (LIPITOR ) 80 MG tablet Take 1 tablet by mouth once daily 90 tablet 0   FARXIGA  10 MG TABS tablet Take 1 tablet (10 mg total) by mouth daily before breakfast. 90 tablet 0   ferrous sulfate  325 (65 FE) MG EC tablet Take 325 mg by mouth 2 (two) times daily.     levETIRAcetam  (KEPPRA ) 750 MG tablet Take 1 tablet (750 mg total) by mouth 2 (two) times daily. 180 tablet 0   medroxyPROGESTERone (PROVERA) 10 MG tablet  Take 10 mg by mouth 2 (two) times daily.     nitroGLYCERIN  (NITROSTAT ) 0.4 MG SL tablet Place 1 tablet under the tongue every 5 (five) minutes as needed. (Patient not taking: Reported on 03/06/2024)  0   No current facility-administered medications on file prior to visit.   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   Past Surgical History:  Procedure Laterality Date   CESAREAN SECTION Bilateral 1995, 2002   CHOLECYSTECTOMY     LYMPH NODE BIOPSY     MOUTH SURGERY Left 07/04/2021   TEE WITHOUT CARDIOVERSION N/A 12/05/2016   Procedure: TRANSESOPHAGEAL  ECHOCARDIOGRAM (TEE);  Surgeon: Alveta, Aleene PARAS, MD;  Location: [facility] ENDOSCOPY;  Service: Cardiovascular;  Laterality: N/A;    Family History  Problem Relation Age of Onset   Lung cancer Mother        mets to brain, bone, liver   Hypertension Father    Heart attack Brother        at age 77   Renal cancer Maternal Grandfather    Heart attack Paternal Grandfather    Social History   Socioeconomic History   Marital status: Single    Spouse name: Not on file   Number of children: 3   Years of education: 12   Highest education level: GED or equivalent  Occupational History   Not on file  Tobacco Use   Smoking status: Never   Smokeless tobacco: Never  Vaping Use   Vaping status: Never Used  Substance and Sexual Activity   Alcohol use: No   Drug use: No   Sexual activity: Yes    Partners: Male    Birth control/protection: Surgical  Other Topics Concern   Not on file  Social History Narrative   Not on file   Social Drivers of Health   Financial Resource Strain: Medium Risk (03/06/2023)   Overall Financial Resource Strain (CARDIA)    Difficulty of Paying Living Expenses: Somewhat hard  Food Insecurity: No Food Insecurity (02/05/2024)   Hunger Vital Sign    Worried About Running Out of Food in the Last Year: Never true    Ran Out of Food in the Last Year: Never true  Transportation Needs: No Transportation Needs (02/05/2024)   PRAPARE - Administrator, Civil Service (Medical): No    Lack of Transportation (Non-Medical): No  Physical Activity: Insufficiently Active (03/06/2023)   Exercise Vital Sign    Days of Exercise per Week: 5 days    Minutes of Exercise per Session: 10 min  Stress: No Stress Concern Present (03/06/2023)   [employer] of Occupational Health - Occupational Stress Questionnaire    Feeling of Stress : Only a little  Social Connections: Moderately Integrated (03/06/2023)   Social Connection and Isolation Panel    Frequency of  Communication with Friends and Family: More than three times a week    Frequency of Social Gatherings with Friends and Family: Three times a week    Attends Religious Services: More than 4 times per year    Active Member of Clubs or Organizations: Yes    Attends Banker Meetings: 1 to 4 times per year    Marital Status: Divorced  Recent Concern: Social Connections - Moderately Isolated (01/04/2023)   Social  Connection and Isolation Panel    Frequency of Communication with Friends and Family: More than three times a week    Frequency of Social Gatherings with Friends and Family: More than three times a week    Attends Religious Services: More than 4 times per year    Active Member of [employer] or Organizations: No    Attends Banker Meetings: Never    Marital Status: Never married    Objective:  BP 124/70   Pulse 87   Temp 98 F (36.7 C)   Ht 5' 3 (1.6 m)   Wt 199 lb (90.3 kg)   LMP 01/30/2024   SpO2 97%   BMI 35.25 kg/m      03/14/2024    2:56 PM 02/27/2024    4:38 PM 02/27/2024    3:28 PM  BP/Weight  Systolic BP 124 106 114  Diastolic BP 70 66 65  Wt. (Lbs) 199    BMI 35.25 kg/m2      Physical Exam Vitals reviewed.  Constitutional:      Appearance: Normal appearance.  Neck:     Vascular: No carotid bruit.  Cardiovascular:     Rate and Rhythm: Normal rate and regular rhythm.     Heart sounds: Normal heart sounds.  Pulmonary:     Effort: Pulmonary effort is normal. No respiratory distress.     Breath sounds: Normal breath sounds.  Abdominal:     General: Abdomen is flat. Bowel sounds are normal.     Palpations: Abdomen is soft.     Tenderness: There is no abdominal tenderness.  Neurological:     Mental Status: She is alert and oriented to person, place, and time.  Psychiatric:        Mood and Affect: Mood normal.        Behavior: Behavior normal.         Lab Results  Component Value Date   WBC 5.8 03/14/2024   HGB 15.4  03/14/2024   HCT 49.3 (H) 03/14/2024   PLT 258 03/14/2024   GLUCOSE 82 03/14/2024   CHOL 99 (L) 03/14/2024   TRIG 79 03/14/2024   HDL 37 (L) 03/14/2024   LDLCALC 46 03/14/2024   ALT 99 (H) 03/14/2024   AST 45 (H) 03/14/2024   NA 141 03/14/2024   K 3.6 03/14/2024   CL 107 (H) 03/14/2024   CREATININE 0.68 03/14/2024   BUN 8 03/14/2024   CO2 19 (L) 03/14/2024   TSH 1.030 11/20/2023   INR 1.0 03/04/2024   HGBA1C 5.1 03/14/2024    Results for orders placed or performed in visit on 03/14/24  CBC with Differential/Platelet   Collection Time: 03/14/24  3:25 PM  Result Value Ref Range   WBC 5.8 3.4 - 10.8 x10E3/uL   RBC 5.56 (H) 3.77 - 5.28 x10E6/uL   Hemoglobin 15.4 11.1 - 15.9 g/dL   Hematocrit 50.6 (H) 65.9 - 46.6 %   MCV 89 79 - 97 fL   MCH 27.7 26.6 - 33.0 pg   MCHC 31.2 (L) 31.5 - 35.7 g/dL   RDW 84.5 88.2 - 84.5 %   Platelets 258 150 - 450 x10E3/uL   Neutrophils 57 Not Estab. %   Lymphs 31 Not Estab. %   Monocytes 9 Not Estab. %   Eos 1 Not Estab. %   Basos 2 Not Estab. %   Neutrophils Absolute 3.3 1.4 - 7.0 x10E3/uL   Lymphocytes Absolute 1.8 0.7 - 3.1 x10E3/uL   Monocytes Absolute  0.5 0.1 - 0.9 x10E3/uL   EOS (ABSOLUTE) 0.1 0.0 - 0.4 x10E3/uL   Basophils Absolute 0.1 0.0 - 0.2 x10E3/uL   Immature Granulocytes 0 Not Estab. %   Immature Grans (Abs) 0.0 0.0 - 0.1 x10E3/uL  Comprehensive metabolic panel with GFR   Collection Time: 03/14/24  3:25 PM  Result Value Ref Range   Glucose 82 70 - 99 mg/dL   BUN 8 6 - 24 mg/dL   Creatinine, Ser 9.31 0.57 - 1.00 mg/dL   eGFR 891 >40 fO/fpw/8.26   BUN/Creatinine Ratio 12 9 - 23   Sodium 141 134 - 144 mmol/L   Potassium 3.6 3.5 - 5.2 mmol/L   Chloride 107 (H) 96 - 106 mmol/L   CO2 19 (L) 20 - 29 mmol/L   Calcium  8.5 (L) 8.7 - 10.2 mg/dL   Total Protein 6.6 6.0 - 8.5 g/dL   Albumin 3.6 (L) 3.9 - 4.9 g/dL   Globulin, Total 3.0 1.5 - 4.5 g/dL   Bilirubin Total 0.7 0.0 - 1.2 mg/dL   Alkaline Phosphatase 141 (H) 41 - 116  IU/L   AST 45 (H) 0 - 40 IU/L   ALT 99 (H) 0 - 32 IU/L  Hemoglobin A1c   Collection Time: 03/14/24  3:25 PM  Result Value Ref Range   Hgb A1c MFr Bld 5.1 4.8 - 5.6 %   Est. average glucose Bld gHb Est-mCnc 100 mg/dL  Lipid panel   Collection Time: 03/14/24  3:25 PM  Result Value Ref Range   Cholesterol, Total 99 (L) 100 - 199 mg/dL   Triglycerides 79 0 - 149 mg/dL   HDL 37 (L) >60 mg/dL   VLDL Cholesterol Cal 16 5 - 40 mg/dL   LDL Chol Calc (NIH) 46 0 - 99 mg/dL   Chol/HDL Ratio 2.7 0.0 - 4.4 ratio  .  Assessment & Plan:   Assessment & Plan Hypertension associated with diabetes (HCC) Well controlled.  No changes to medicines.  Continue to work on eating a healthy diet and exercise.  Labs drawn today.    Type 2 diabetes well controlled with stable blood glucose during hospitalization. A1c needs evaluation for glycemic control. - Check A1c level today. Orders:   CBC with Differential/Platelet   Comprehensive metabolic panel with GFR   Hemoglobin A1c   Lipid panel  Hemiparesis affecting left side as late effect of cerebrovascular accident [facility]) Continue current medications. Continue physical therapy at home.      Iron  deficiency anemia secondary to blood loss (chronic) Energy levels better than hospitalization. - Check complete blood count (CBC) to assess hemoglobin levels.    Edema of left lower extremity Left foot less swollen than before but more than right.    Class 2 severe obesity due to excess calories with serious comorbidity and body mass index (BMI) of 35.0 to 35.9 in adult On Mounjaro  with reduced appetite and weight loss. Tolerating well. - Increase dose of Mounjaro  to 5 mg weekly. . - Encourage consumption of fruits and vegetables, particularly green vegetables.    Body mass index is 35.25 kg/m.   Meds ordered this encounter  Medications   tirzepatide  (MOUNJARO ) 5 MG/0.5ML Pen    Sig: Inject 5 mg into the skin once a week.    Dispense:  2 mL     Refill:  1    Orders Placed This Encounter  Procedures   CBC with Differential/Platelet   Comprehensive metabolic panel with GFR   Hemoglobin A1c   Lipid panel  Follow-up: Return in about 3 months (around 06/14/2024) for chronic follow up.  An After Visit Summary was printed and given to the patient.  Abigail Free, MD [facility] [phone] "

## 2024-03-14 NOTE — Assessment & Plan Note (Addendum)
 Well controlled.  No changes to medicines.  Continue to work on eating a healthy diet and exercise.  Labs drawn today.    Type 2 diabetes well controlled with stable blood glucose during hospitalization. A1c needs evaluation for glycemic control. - Check A1c level today. Orders:   CBC with Differential/Platelet   Comprehensive metabolic panel with GFR   Hemoglobin A1c   Lipid panel

## 2024-03-15 ENCOUNTER — Encounter: Payer: Self-pay | Admitting: Oncology

## 2024-03-15 ENCOUNTER — Ambulatory Visit: Payer: Self-pay | Admitting: Family Medicine

## 2024-03-15 ENCOUNTER — Other Ambulatory Visit (HOSPITAL_BASED_OUTPATIENT_CLINIC_OR_DEPARTMENT_OTHER): Payer: Self-pay

## 2024-03-15 ENCOUNTER — Ambulatory Visit: Admitting: Family Medicine

## 2024-03-15 LAB — COMPREHENSIVE METABOLIC PANEL WITH GFR
ALT: 99 IU/L — ABNORMAL HIGH (ref 0–32)
AST: 45 IU/L — ABNORMAL HIGH (ref 0–40)
Albumin: 3.6 g/dL — ABNORMAL LOW (ref 3.9–4.9)
Alkaline Phosphatase: 141 IU/L — ABNORMAL HIGH (ref 41–116)
BUN/Creatinine Ratio: 12 (ref 9–23)
BUN: 8 mg/dL (ref 6–24)
Bilirubin Total: 0.7 mg/dL (ref 0.0–1.2)
CO2: 19 mmol/L — ABNORMAL LOW (ref 20–29)
Calcium: 8.5 mg/dL — ABNORMAL LOW (ref 8.7–10.2)
Chloride: 107 mmol/L — ABNORMAL HIGH (ref 96–106)
Creatinine, Ser: 0.68 mg/dL (ref 0.57–1.00)
Globulin, Total: 3 g/dL (ref 1.5–4.5)
Glucose: 82 mg/dL (ref 70–99)
Potassium: 3.6 mmol/L (ref 3.5–5.2)
Sodium: 141 mmol/L (ref 134–144)
Total Protein: 6.6 g/dL (ref 6.0–8.5)
eGFR: 108 mL/min/1.73 (ref >59)

## 2024-03-15 LAB — CBC WITH DIFFERENTIAL/PLATELET
Basophils Absolute: 0.1 x10E3/uL (ref 0.0–0.2)
Basos: 2 %
EOS (ABSOLUTE): 0.1 x10E3/uL (ref 0.0–0.4)
Eos: 1 %
Hematocrit: 49.3 % — ABNORMAL HIGH (ref 34.0–46.6)
Hemoglobin: 15.4 g/dL (ref 11.1–15.9)
Immature Grans (Abs): 0 x10E3/uL (ref 0.0–0.1)
Immature Granulocytes: 0 %
Lymphocytes Absolute: 1.8 x10E3/uL (ref 0.7–3.1)
Lymphs: 31 %
MCH: 27.7 pg (ref 26.6–33.0)
MCHC: 31.2 g/dL — ABNORMAL LOW (ref 31.5–35.7)
MCV: 89 fL (ref 79–97)
Monocytes Absolute: 0.5 x10E3/uL (ref 0.1–0.9)
Monocytes: 9 %
Neutrophils Absolute: 3.3 x10E3/uL (ref 1.4–7.0)
Neutrophils: 57 %
Platelets: 258 x10E3/uL (ref 150–450)
RBC: 5.56 x10E6/uL — ABNORMAL HIGH (ref 3.77–5.28)
RDW: 15.4 % (ref 11.7–15.4)
WBC: 5.8 x10E3/uL (ref 3.4–10.8)

## 2024-03-15 LAB — HEMOGLOBIN A1C
Est. average glucose Bld gHb Est-mCnc: 100 mg/dL
Hgb A1c MFr Bld: 5.1 % (ref 4.8–5.6)

## 2024-03-15 LAB — LIPID PANEL
Chol/HDL Ratio: 2.7 ratio (ref 0.0–4.4)
Cholesterol, Total: 99 mg/dL — ABNORMAL LOW (ref 100–199)
HDL: 37 mg/dL — ABNORMAL LOW (ref >39)
LDL Chol Calc (NIH): 46 mg/dL (ref 0–99)
Triglycerides: 79 mg/dL (ref 0–149)
VLDL Cholesterol Cal: 16 mg/dL (ref 5–40)

## 2024-03-17 NOTE — Assessment & Plan Note (Signed)
 On Mounjaro  with reduced appetite and weight loss. Tolerating well. - Increase dose of Mounjaro . - Encourage consumption of fruits and vegetables, particularly green vegetables.

## 2024-03-17 NOTE — Assessment & Plan Note (Signed)
 Energy levels better than hospitalization. - Check complete blood count (CBC) to assess hemoglobin levels.

## 2024-03-29 ENCOUNTER — Telehealth: Payer: Self-pay

## 2024-04-05 ENCOUNTER — Other Ambulatory Visit: Payer: Self-pay

## 2024-04-05 NOTE — Patient Instructions (Addendum)
 Visit Information  Thank you for taking time to visit with me today. Please don't hesitate to contact me if I can be of assistance to you in the future.   Our next appointment is no further scheduled appointments.    We discussed today: [facility] Care [phone]) to follow up on Ucard and OTC products. Food that can increase HDL: olive oil, avocados, walnuts, almonds, seet fatty fish:salmon mackerel sardine, encouraged vegetables, fruits whole grain. Avoid packaged and fried foods and saturated fats.   Contact RN Case Manager and/or PCP if care management needs in the future.  Please call the Suicide and Crisis Lifeline: 988 call the [place]  National Suicide Prevention Lifeline: 571-422-7019 or TTY: 279-295-6514 TTY 331-412-5948) to talk to a trained counselor if you are experiencing a Mental Health or Behavioral Health Crisis or need someone to talk to.   Heddy Shutter, RN, MSN, BSN, CCM [place]  [facility], Population Health Case Manager Phone: [phone]

## 2024-04-05 NOTE — Patient Outreach (Signed)
 Complex Care Management   Visit Note  04/05/2024  Name:  Shannon Erickson MRN: [id number] DOB: 07/25/1975  Situation: Referral received for Complex Care Management related to referral per [facility] team regarding recent admission for vaginal bleeding I obtained verbal consent from Patient.  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 She reports the bleeding has stopped. She has all the medications prescribed. She reports everything is moving in a positive direction. A1C. BP last office visit 124/70 HR 87. Hgb 15/4/HCT 49.3 on 03/14/24. HDL 37.  RNCM provided contact number for [facility] Care Navigator([phone]) to follow up on ucard and OTC products. RNCM encouraged patient to obtain a blood pressure monitor to be able to check as needed. RNCM offered educational resources-Patient denies. Discussed food that can increase HDL: olive oil, avocados, walnuts, almonds, seet fatty fish:salmon mackerel sardine, encouraged vegetables, fruits whole grain. Encouraged to avoid packaged and fried foods and saturated fats.  Declines care management services. RNCM encouraged patient to contact RNCM and/or PCP if care management needs in the future.  Patient Reported Symptoms:  Cognitive Cognitive Status: No symptoms reported      Neurological Neurological Review of Symptoms: No symptoms reported    HEENT HEENT Symptoms Reported: No symptoms reported      Cardiovascular Cardiovascular Symptoms Reported: No symptoms reported    Respiratory Respiratory Symptoms Reported: No symptoms reported    Endocrine Endocrine Symptoms Reported: No symptoms reported Is patient diabetic?: Yes Is patient checking blood sugars at home?: No     Gastrointestinal Gastrointestinal Symptoms Reported: No symptoms reported      Genitourinary Genitourinary Symptoms Reported: No symptoms reported    Integumentary Integumentary Symptoms Reported: No symptoms reported    Musculoskeletal Musculoskelatal Symptoms Reviewed: Limited mobility, Joint pain Additional Musculoskeletal Details: limited mobility due to history of stroke and both knees  arthritits. but manages with resting when she needs to avoid fatigue. reports this is not a problem Musculoskeletal Management Strategies: Adequate rest, Activity, Medical device Musculoskeletal Self-Management Outcome: 5 (very good) Falls in the past year?: No    Psychosocial Psychosocial Symptoms Reported: No symptoms reported     Quality of Family Relationships: supportive   There were no vitals filed for this visit.    Medications Reviewed Today     Reviewed by Dalexa Gentz M, RN (Registered Nurse) on 04/05/24 at [phone]  Med List Status: <None>   Medication Order Taking? Sig Documenting Provider Last Dose Status Informant  apixaban  (ELIQUIS ) 5 MG TABS tablet [id number] Yes Take 1 tablet (5 mg total) by mouth 2 (two) times daily. Mosher, Kelli A, PA-C  Active   atorvastatin  (LIPITOR ) 80 MG tablet [id number] Yes Take 1 tablet by mouth once daily Cox, Kirsten, MD  Active   FARXIGA  10 MG TABS tablet [id number] Yes Take 1 tablet (10 mg total) by mouth daily before breakfast. Cox, Kirsten, MD  Active   ferrous sulfate 325 (65 FE) MG EC tablet [id number] Yes Take 325 mg by mouth 2 (two) times daily. [provider]  Active   levETIRAcetam  (KEPPRA ) 750 MG tablet [id number] Yes Take 1 tablet (750 mg total) by mouth 2 (two) times daily. Cox,  Kirsten, MD  Active   medroxyPROGESTERone (PROVERA) 10 MG tablet [id number] Yes Take 10 mg by mouth 2 (two) times daily. [provider]  Active   nitroGLYCERIN  (NITROSTAT ) 0.4 MG SL tablet [id number]  Place 1 tablet under the tongue every 5 (five)  minutes as needed.  Patient not taking: Reported on 04/05/2024   [provider]  Active   tirzepatide  (MOUNJARO ) 5 MG/0.5ML Pen [id number] Yes Inject 5 mg into the skin once a week. Cox, Kirsten, MD  Active           Recommendation:   Patient to continue to follow up with providers as scheduled/recommended  Follow Up Plan:   No follow up required.   Heddy Shutter, RN, MSN, BSN, CCM [place]  [facility], Population Health Case Manager Phone: [phone]

## 2024-04-08 ENCOUNTER — Other Ambulatory Visit (HOSPITAL_BASED_OUTPATIENT_CLINIC_OR_DEPARTMENT_OTHER): Payer: Self-pay

## 2024-04-14 ENCOUNTER — Other Ambulatory Visit: Payer: Self-pay | Admitting: Family Medicine

## 2024-04-14 DIAGNOSIS — E119 Type 2 diabetes mellitus without complications: Secondary | ICD-10-CM

## 2024-04-15 ENCOUNTER — Other Ambulatory Visit: Payer: Self-pay | Admitting: Family Medicine

## 2024-04-15 DIAGNOSIS — E782 Mixed hyperlipidemia: Secondary | ICD-10-CM

## 2024-04-17 ENCOUNTER — Other Ambulatory Visit: Payer: Self-pay

## 2024-04-17 ENCOUNTER — Encounter: Payer: Self-pay | Admitting: Oncology

## 2024-04-17 ENCOUNTER — Other Ambulatory Visit: Payer: Self-pay | Admitting: Family Medicine

## 2024-04-17 DIAGNOSIS — E782 Mixed hyperlipidemia: Secondary | ICD-10-CM

## 2024-04-17 DIAGNOSIS — D6861 Antiphospholipid syndrome: Secondary | ICD-10-CM

## 2024-04-17 DIAGNOSIS — I63511 Cerebral infarction due to unspecified occlusion or stenosis of right middle cerebral artery: Secondary | ICD-10-CM

## 2024-04-17 MED ORDER — APIXABAN 5 MG PO TABS: 5.0000 mg | ORAL_TABLET | Freq: Two times a day (BID) | ORAL | 1 refills | Status: AC

## 2024-04-17 NOTE — Telephone Encounter (Signed)
 Copied from CRM #[id number]. Topic: Clinical - Medication Refill >> Apr 17, 2024 10:46 AM Hadassah PARAS wrote: Medication: atorvastatin  (LIPITOR ) 80 MG tablet   Has the patient contacted their pharmacy? Yes (Agent: If no, request that the patient contact the pharmacy for the refill. If patient does not wish to contact the pharmacy document the reason why and proceed with request.) (Agent: If yes, when and what did the pharmacy advise?)  This is the patient's preferred pharmacy:    [facility] [address], [place] - [address] [place] [address] Phone: [phone] Fax: [phone]  Is this the correct pharmacy for this prescription? Yes If no, delete pharmacy and type the correct one.   Has the prescription been filled recently? Yes  Is the patient out of the medication? No  Has the patient been seen for an appointment in the last year OR does the patient have an upcoming appointment? Yes  Can we respond through MyChart? Yes  Agent: Please be advised that Rx refills may take up to 3 business days. We ask that you follow-up with your pharmacy.

## 2024-04-22 ENCOUNTER — Other Ambulatory Visit: Payer: Self-pay

## 2024-04-22 ENCOUNTER — Other Ambulatory Visit (HOSPITAL_BASED_OUTPATIENT_CLINIC_OR_DEPARTMENT_OTHER): Payer: Self-pay

## 2024-04-22 ENCOUNTER — Encounter: Payer: Self-pay | Admitting: Oncology

## 2024-04-22 MED ORDER — FERROUS SULFATE 325 (65 FE) MG PO TBEC
325.0000 mg | DELAYED_RELEASE_TABLET | Freq: Two times a day (BID) | ORAL | 1 refills | Status: DC
  Filled 2024-04-22: qty 180, 90d supply, fill #0

## 2024-04-23 ENCOUNTER — Other Ambulatory Visit: Payer: Self-pay | Admitting: Medical Genetics

## 2024-04-23 DIAGNOSIS — Z006 Encounter for examination for normal comparison and control in clinical research program: Secondary | ICD-10-CM

## 2024-05-02 ENCOUNTER — Other Ambulatory Visit: Payer: Self-pay

## 2024-05-02 ENCOUNTER — Other Ambulatory Visit (HOSPITAL_BASED_OUTPATIENT_CLINIC_OR_DEPARTMENT_OTHER): Payer: Self-pay

## 2024-05-02 MED ORDER — FERROUS SULFATE 325 (65 FE) MG PO TBEC: 325.0000 mg | DELAYED_RELEASE_TABLET | Freq: Two times a day (BID) | ORAL | 1 refills | Status: AC

## 2024-05-10 ENCOUNTER — Telehealth: Payer: Self-pay | Admitting: Oncology

## 2024-05-10 ENCOUNTER — Other Ambulatory Visit: Payer: Self-pay | Admitting: Oncology

## 2024-05-10 ENCOUNTER — Inpatient Hospital Stay

## 2024-05-10 ENCOUNTER — Inpatient Hospital Stay: Attending: Oncology | Admitting: Oncology

## 2024-05-10 VITALS — BP 118/77 | HR 85 | Temp 98.3°F | Resp 16 | Ht 63.0 in | Wt 197.5 lb

## 2024-05-10 DIAGNOSIS — Z7901 Long term (current) use of anticoagulants: Secondary | ICD-10-CM | POA: Diagnosis not present

## 2024-05-10 DIAGNOSIS — Z8673 Personal history of transient ischemic attack (TIA), and cerebral infarction without residual deficits: Secondary | ICD-10-CM | POA: Diagnosis not present

## 2024-05-10 DIAGNOSIS — D509 Iron deficiency anemia, unspecified: Secondary | ICD-10-CM | POA: Insufficient documentation

## 2024-05-10 DIAGNOSIS — D500 Iron deficiency anemia secondary to blood loss (chronic): Secondary | ICD-10-CM

## 2024-05-10 DIAGNOSIS — D6861 Antiphospholipid syndrome: Secondary | ICD-10-CM | POA: Diagnosis not present

## 2024-05-10 LAB — CBC WITH DIFFERENTIAL (CANCER CENTER ONLY)
Abs Immature Granulocytes: 0.02 K/uL (ref 0.00–0.07)
Basophils Absolute: 0.1 K/uL (ref 0.0–0.1)
Basophils Relative: 1 %
Eosinophils Absolute: 0.1 K/uL (ref 0.0–0.5)
Eosinophils Relative: 1 %
HCT: 46.5 % — ABNORMAL HIGH (ref 36.0–46.0)
Hemoglobin: 15.4 g/dL — ABNORMAL HIGH (ref 12.0–15.0)
Immature Granulocytes: 0 %
Lymphocytes Relative: 30 %
Lymphs Abs: 2.2 K/uL (ref 0.7–4.0)
MCH: 28.4 pg (ref 26.0–34.0)
MCHC: 33.1 g/dL (ref 30.0–36.0)
MCV: 85.6 fL (ref 80.0–100.0)
Monocytes Absolute: 0.5 K/uL (ref 0.1–1.0)
Monocytes Relative: 8 %
Neutro Abs: 4.3 K/uL (ref 1.7–7.7)
Neutrophils Relative %: 60 %
Platelet Count: 243 K/uL (ref 150–400)
RBC: 5.43 MIL/uL — ABNORMAL HIGH (ref 3.87–5.11)
RDW: 15.3 % (ref 11.5–15.5)
WBC Count: 7.2 K/uL (ref 4.0–10.5)
nRBC: 0 % (ref 0.0–0.2)

## 2024-05-10 LAB — GENECONNECT MOLECULAR SCREEN: Genetic Analysis Overall Interpretation: NEGATIVE

## 2024-05-10 LAB — PROTIME-INR
INR: 0.9 (ref 0.8–1.2)
Prothrombin Time: 13.1 s (ref 11.4–15.2)

## 2024-05-10 LAB — IRON AND TIBC
Iron: 94 ug/dL (ref 28–170)
Saturation Ratios: 29 % (ref 10.4–31.8)
TIBC: 326 ug/dL (ref 250–450)
UIBC: 232 ug/dL

## 2024-05-10 LAB — FERRITIN: Ferritin: 292 ng/mL (ref 11–307)

## 2024-05-10 NOTE — Progress Notes (Signed)
 " [facility] [address] [place],  [place]  [address] [phone]  Clinic Day:  05/10/2024  Referring physician: Sherre Clapper, MD   HISTORY OF PRESENT ILLNESS:  The patient is a 48 y.o. female, who over the past few months, has been dealing with recurrent bouts of iron  deficiency anemia.  She comes in today to reassess her iron  and hemoglobin levels after receiving another course of IV iron  in September/October 2025.  She comes into clinic feeling great today.  The patient claims she has not had any further problems with heavy menstrual cycles since her Provera was increased to 10 mg twice daily.  In fact, she has not had any menstrual cycle since Provera was started.  She also denies having other overt forms of blood loss.   Of note, this patient has also been diagnosed with antiphospholipid syndrome.  This was diagnosed after having an embolic stroke in 2018.  On 3 separate occasions, her IgG beta-2  glycoprotein antibody level has been very elevated.  Based upon this, she is on chronic anticoagulation with Eliquis , which she continues to take at 5 mg twice daily.    PHYSICAL EXAM:  Blood pressure 118/77, pulse 85, temperature 98.3 F (36.8 C), temperature source Oral, resp. rate 16, height 5' 3 (1.6 m), weight 197 lb 8 oz (89.6 kg), SpO2 98%. Wt Readings from Last 3 Encounters:  05/10/24 197 lb 8 oz (89.6 kg)  03/14/24 199 lb (90.3 kg)  02/16/24 201 lb (91.2 kg)   Body mass index is 34.99 kg/m. Performance status (ECOG): 1 - Symptomatic but completely ambulatory Physical Exam Constitutional:      Appearance: She is not ill-appearing.  HENT:     Mouth/Throat:     Mouth: Mucous membranes are moist.     Pharynx: Oropharynx is clear. No oropharyngeal exudate or posterior oropharyngeal erythema.  Cardiovascular:     Rate and Rhythm: Normal rate and regular rhythm.     Heart sounds: No murmur heard.    No friction rub. No gallop.  Pulmonary:      Effort: Pulmonary effort is normal. No respiratory distress.     Breath sounds: Normal breath sounds. No wheezing, rhonchi or rales.  Abdominal:     General: Bowel sounds are normal. There is no distension.     Palpations: Abdomen is soft. There is no mass.     Tenderness: There is no abdominal tenderness.  Musculoskeletal:        General: No swelling.     Right lower leg: No edema.     Left lower leg: No edema.  Lymphadenopathy:     Cervical: No cervical adenopathy.     Upper Body:     Right upper body: No supraclavicular or axillary adenopathy.     Left upper body: No supraclavicular or axillary adenopathy.     Lower Body: No right inguinal adenopathy. No left inguinal adenopathy.  Skin:    General: Skin is warm.     Coloration: Skin is not jaundiced.     Findings: No lesion or rash.  Neurological:     General: No focal deficit present.     Mental Status: She is alert and oriented to person, place, and time. Mental status is at baseline.     Motor: No weakness (Left arm).  Psychiatric:        Mood and Affect: Mood normal.        Behavior: Behavior normal.  Thought Content: Thought content normal.    LABS:      Latest Ref Rng & Units 05/10/2024    2:39 PM 03/14/2024    3:25 PM 02/09/2024    3:43 PM  CBC  WBC 4.0 - 10.5 K/uL 7.2  5.8  6.3   Hemoglobin 12.0 - 15.0 g/dL 84.5  84.5  8.5   Hematocrit 36.0 - 46.0 % 46.5  49.3  29.2   Platelets 150 - 400 K/uL 243  258  264       Latest Ref Rng & Units 03/14/2024    3:25 PM 11/20/2023    3:11 PM 08/29/2023    1:06 PM  CMP  Glucose 70 - 99 mg/dL 82  99  837   BUN 6 - 24 mg/dL 8  12  9    Creatinine 0.57 - 1.00 mg/dL 9.31  9.19  9.16   Sodium 134 - 144 mmol/L 141  139  138   Potassium 3.5 - 5.2 mmol/L 3.6  4.2  3.6   Chloride 96 - 106 mmol/L 107  104  103   CO2 20 - 29 mmol/L 19  21  22    Calcium  8.7 - 10.2 mg/dL 8.5  8.6  8.2   Total Protein 6.0 - 8.5 g/dL 6.6  6.8  6.1   Total Bilirubin 0.0 - 1.2 mg/dL 0.7  0.5   0.5   Alkaline Phos 41 - 116 IU/L 141  159  182   AST 0 - 40 IU/L 45  49  80   ALT 0 - 32 IU/L 99  68  103     Latest Reference Range & Units 12/04/16 13:16 02/22/17 12:42 08/01/22 15:43  Beta-2  Glycoprotein I Ab, IgG 0 - 20 GPI IgG units 109 (H) >150 (H) 127 (H)  (H): Data is abnormally high   Latest Reference Range & Units 01/30/24 13:15 05/10/24 14:39  Iron  28 - 170 ug/dL 20 (L) 94  UIBC ug/dL 627 767  TIBC 749 - 549 ug/dL 607 673  Saturation Ratios 10.4 - 31.8 % 5 (L) 29  Ferritin 11 - 307 ng/mL 28 292  (L): Data is abnormally low  ASSESSMENT & PLAN:  Assessment/Plan:  A 48 y.o. female with antiphospholipid syndrome, which was discovered per a workup for an embolic stroke in 2018.  As this disorder is very hypercoagulable, she is on Eliquis  5 mg twice daily, which she will take indefinitely.   With respect to her iron  deficiency anemia, I am pleased that her hemoglobin has risen to 15.4.  Furthermore, her iron  parameters are normal today.  Just 3 months ago, this patient's hemoglobin was extremely low at 5.5.  The patient knows to continue taking her Provera twice daily to help control the heaviness of her menstrual cycles.  Otherwise, as she is clinically doing well, I will see her back in 6 months for repeat clinical assessment.  The patient understands all the plans discussed today and is in agreement with them.    Maicee Ullman DELENA Kerns, MD       "

## 2024-05-10 NOTE — Telephone Encounter (Signed)
 Patient has been scheduled for follow-up visit per 05/10/2024 LOS.  Pt given an appt calendar with date and time.

## 2024-05-14 ENCOUNTER — Other Ambulatory Visit: Payer: Self-pay | Admitting: Family Medicine

## 2024-05-14 ENCOUNTER — Other Ambulatory Visit (HOSPITAL_BASED_OUTPATIENT_CLINIC_OR_DEPARTMENT_OTHER): Payer: Self-pay

## 2024-05-14 MED ORDER — MOUNJARO 5 MG/0.5ML ~~LOC~~ SOAJ
5.0000 mg | SUBCUTANEOUS | 1 refills | Status: AC
  Filled 2024-05-14: qty 2, 28d supply, fill #0

## 2024-06-18 ENCOUNTER — Ambulatory Visit: Admitting: Family Medicine

## 2024-06-21 ENCOUNTER — Other Ambulatory Visit: Payer: Self-pay | Admitting: Family Medicine

## 2024-06-21 DIAGNOSIS — G40909 Epilepsy, unspecified, not intractable, without status epilepticus: Secondary | ICD-10-CM

## 2024-06-24 ENCOUNTER — Other Ambulatory Visit: Payer: Self-pay | Admitting: Family Medicine

## 2024-06-24 DIAGNOSIS — G40909 Epilepsy, unspecified, not intractable, without status epilepticus: Secondary | ICD-10-CM

## 2024-07-02 ENCOUNTER — Ambulatory Visit: Admitting: Family Medicine

## 2024-07-02 DIAGNOSIS — I152 Hypertension secondary to endocrine disorders: Secondary | ICD-10-CM

## 2024-07-02 DIAGNOSIS — E782 Mixed hyperlipidemia: Secondary | ICD-10-CM

## 2024-08-20 ENCOUNTER — Ambulatory Visit: Admitting: Family Medicine

## 2024-11-08 ENCOUNTER — Inpatient Hospital Stay

## 2024-11-08 ENCOUNTER — Inpatient Hospital Stay: Admitting: Oncology
